# Patient Record
Sex: Male | Born: 1940
Health system: Southern US, Community
[De-identification: ages and names within clinical notes are randomized; demographics above are authoritative.]

## PROBLEM LIST (undated history)

## (undated) DIAGNOSIS — G4730 Sleep apnea, unspecified: Secondary | ICD-10-CM

## (undated) DIAGNOSIS — E669 Obesity, unspecified: Secondary | ICD-10-CM

## (undated) DIAGNOSIS — J45909 Unspecified asthma, uncomplicated: Secondary | ICD-10-CM

## (undated) DIAGNOSIS — K219 Gastro-esophageal reflux disease without esophagitis: Secondary | ICD-10-CM

## (undated) DIAGNOSIS — T7840XA Allergy, unspecified, initial encounter: Secondary | ICD-10-CM

## (undated) DIAGNOSIS — E119 Type 2 diabetes mellitus without complications: Secondary | ICD-10-CM

## (undated) DIAGNOSIS — I10 Essential (primary) hypertension: Secondary | ICD-10-CM

## (undated) HISTORY — PX: CHOLECYSTECTOMY: SHX55

## (undated) HISTORY — DX: Sleep apnea, unspecified: G47.30

## (undated) HISTORY — DX: Essential (primary) hypertension: I10

## (undated) HISTORY — PX: DENTAL SURGERY: SHX609

## (undated) HISTORY — DX: Obesity, unspecified: E66.9

## (undated) HISTORY — DX: Gastro-esophageal reflux disease without esophagitis: K21.9

## (undated) HISTORY — DX: Unspecified asthma, uncomplicated: J45.909

## (undated) HISTORY — DX: Type 2 diabetes mellitus without complications: E11.9

## (undated) HISTORY — DX: Allergy, unspecified, initial encounter: T78.40XA

---

## 2002-09-30 ENCOUNTER — Encounter: Admission: RE | Admit: 2002-09-30 | Discharge: 2002-09-30 | Payer: Self-pay | Admitting: Family Medicine

## 2002-09-30 ENCOUNTER — Encounter: Payer: Self-pay | Admitting: Family Medicine

## 2002-10-21 ENCOUNTER — Encounter: Payer: Self-pay | Admitting: General Surgery

## 2002-10-28 ENCOUNTER — Encounter: Payer: Self-pay | Admitting: General Surgery

## 2002-10-28 ENCOUNTER — Observation Stay (HOSPITAL_COMMUNITY): Admission: RE | Admit: 2002-10-28 | Discharge: 2002-10-30 | Payer: Self-pay | Admitting: General Surgery

## 2002-10-29 ENCOUNTER — Encounter: Payer: Self-pay | Admitting: Internal Medicine

## 2011-06-12 ENCOUNTER — Other Ambulatory Visit: Payer: Self-pay | Admitting: Family Medicine

## 2011-06-12 ENCOUNTER — Ambulatory Visit
Admission: RE | Admit: 2011-06-12 | Discharge: 2011-06-12 | Disposition: A | Discharge status: Home / Self Care | Payer: Medicare Other | Source: Ambulatory Visit | Attending: Family Medicine | Admitting: Family Medicine

## 2011-06-12 DIAGNOSIS — J189 Pneumonia, unspecified organism: Secondary | ICD-10-CM

## 2011-06-12 DIAGNOSIS — R062 Wheezing: Secondary | ICD-10-CM

## 2012-07-09 ENCOUNTER — Telehealth: Payer: Self-pay | Admitting: Family Medicine

## 2012-07-09 MED ORDER — CETIRIZINE HCL 10 MG PO TABS: 10.0000 mg | ORAL_TABLET | Freq: Every day | ORAL | Status: DC

## 2012-07-09 NOTE — Telephone Encounter (Signed)
rx refilled.

## 2012-07-13 ENCOUNTER — Telehealth: Payer: Self-pay | Admitting: Physician Assistant

## 2012-07-13 MED ORDER — ZAFIRLUKAST 10 MG PO TABS: 10.0000 mg | ORAL_TABLET | Freq: Two times a day (BID) | ORAL | Status: DC

## 2012-07-13 NOTE — Telephone Encounter (Signed)
Medication refilled per protocol.Patient needs to be seen before any further refills 

## 2012-08-10 ENCOUNTER — Telehealth: Payer: Self-pay | Admitting: Physician Assistant

## 2012-08-10 MED ORDER — ZAFIRLUKAST 10 MG PO TABS: 10.0000 mg | ORAL_TABLET | Freq: Two times a day (BID) | ORAL | Status: DC

## 2012-08-10 NOTE — Telephone Encounter (Signed)
Medication refilled per protocol. 

## 2013-04-05 ENCOUNTER — Ambulatory Visit (INDEPENDENT_AMBULATORY_CARE_PROVIDER_SITE_OTHER): Payer: Medicare Other | Admitting: Family Medicine

## 2013-04-05 ENCOUNTER — Encounter: Payer: Self-pay | Admitting: Family Medicine

## 2013-04-05 VITALS — BP 132/72 | HR 100 | Temp 97.4°F | Resp 24 | Wt 312.0 lb

## 2013-04-05 DIAGNOSIS — J45909 Unspecified asthma, uncomplicated: Secondary | ICD-10-CM

## 2013-04-05 DIAGNOSIS — G4730 Sleep apnea, unspecified: Secondary | ICD-10-CM | POA: Insufficient documentation

## 2013-04-05 MED ORDER — METHYLPREDNISOLONE ACETATE 40 MG/ML IJ SUSP
80.0000 mg | Freq: Once | INTRAMUSCULAR | Status: AC
  Administered 2013-04-05: 80 mg via INTRAMUSCULAR

## 2013-04-05 MED ORDER — PREDNISONE 20 MG PO TABS: ORAL_TABLET | ORAL | Status: DC

## 2013-04-05 NOTE — Progress Notes (Signed)
   Subjective:    Patient ID: William Howell, male    DOB: 1941/01/30, 72 y.o.   MRN: [id number]  HPI Patient began feeling sick one week ago. Symptoms included coughing, increasing shortness of breath, and wheezing. He went to an urgent care on Friday and was diagnosed with walking pneumonia. He was given a Z-Pak as well as albuterol 2 puffs inhaled every 6 hours. He is feeling some better. His pulse oximetry is 97% on room air. He is afebrile. He continues to have a significant amount of wheezing and coughing and shortness of breath. He is still requiring albuterol every 6 hours. Past Medical History  Diagnosis Date  . Obesity   . Hypertension   . Asthma   . Sleep apnea    Current Outpatient Prescriptions on File Prior to Visit  Medication Sig Dispense Refill  . cetirizine (ZYRTEC) 10 MG tablet Take 1 tablet (10 mg total) by mouth daily.  30 tablet  11  . zafirlukast (ACCOLATE) 10 MG tablet Take 1 tablet (10 mg total) by mouth 2 (two) times daily.  60 tablet  5   No current facility-administered medications on file prior to visit.   No Known Allergies History   Social History  . Marital Status: Married    Spouse Name: N/A    Number of Children: N/A  . Years of Education: N/A   Occupational History  . Not on file.   Social History Main Topics  . Smoking status: Never Smoker   . Smokeless tobacco: Not on file  . Alcohol Use: No  . Drug Use: No  . Sexual Activity: Not on file   Other Topics Concern  . Not on file   Social History Narrative  . No narrative on file      Review of Systems  All other systems reviewed and are negative.       Objective:   Physical Exam  Constitutional: He appears well-developed and well-nourished. No distress.  HENT:  Right Ear: External ear normal.  Left Ear: External ear normal.  Nose: Nose normal.  Mouth/Throat: Oropharynx is clear and moist. No oropharyngeal exudate.  Eyes: Conjunctivae are normal. Pupils are equal, round,  and reactive to light. No scleral icterus.  Neck: Neck supple. No JVD present.  Cardiovascular: Normal rate, regular rhythm and normal heart sounds.   No murmur heard. Pulmonary/Chest: Effort normal. He has wheezes. He has rales.  Abdominal: Soft. Bowel sounds are normal.  Lymphadenopathy:    He has no cervical adenopathy.  Skin: He is not diaphoretic.          Assessment & Plan:  1. Reactive airway disease Clinically the patient is improving. Continue the Z-Pak. However at the present time is wheezing significantly. I will add prednisone in. I gave the patient Depo-Medrol 80 mg IM x1 now. Tomorrow he is to start the prednisone taper pack. He can continue to use albuterol 2 puffs inhaled every 4-6 hours as needed. Recheck in 48 hours if no better or sooner if worse. - predniSONE (DELTASONE) 20 MG tablet; 3 tabs poqday 1-2, 2 tabs poqday 3-4, 1 tab poqday 5-6  Dispense: 12 tablet; Refill: 0 - methylPREDNISolone acetate (DEPO-MEDROL) injection 80 mg; Inject 2 mLs (80 mg total) into the muscle once.

## 2013-04-12 ENCOUNTER — Other Ambulatory Visit: Payer: Self-pay | Admitting: Family Medicine

## 2013-04-12 ENCOUNTER — Telehealth: Payer: Self-pay | Admitting: Family Medicine

## 2013-04-12 MED ORDER — BENZONATATE 100 MG PO CAPS: 200.0000 mg | ORAL_CAPSULE | Freq: Three times a day (TID) | ORAL | Status: DC | PRN

## 2013-04-12 NOTE — Telephone Encounter (Signed)
Patient aware.

## 2013-04-12 NOTE — Telephone Encounter (Signed)
Pt is calling to see if something can be called in for his cough [facility] is [facility] [address] Call back number is [phone]

## 2013-04-12 NOTE — Telephone Encounter (Signed)
I e-scribed tessalon to his pharmacy.

## 2013-05-17 ENCOUNTER — Other Ambulatory Visit: Payer: Self-pay | Admitting: Family Medicine

## 2013-05-17 NOTE — Telephone Encounter (Signed)
ok 

## 2013-05-17 NOTE — Telephone Encounter (Signed)
?   OK to Refill  

## 2013-07-29 ENCOUNTER — Other Ambulatory Visit: Payer: Self-pay | Admitting: Family Medicine

## 2013-09-04 ENCOUNTER — Emergency Department: Payer: Self-pay | Admitting: Emergency Medicine

## 2013-09-06 ENCOUNTER — Emergency Department: Payer: Self-pay | Admitting: Emergency Medicine

## 2013-09-08 ENCOUNTER — Other Ambulatory Visit: Payer: Self-pay | Admitting: Family Medicine

## 2013-09-08 NOTE — Telephone Encounter (Signed)
Refill appropriate and filled per protocol. 

## 2013-09-16 ENCOUNTER — Emergency Department: Payer: Self-pay | Admitting: Emergency Medicine

## 2013-11-13 ENCOUNTER — Encounter (HOSPITAL_COMMUNITY): Payer: Self-pay | Admitting: Emergency Medicine

## 2013-11-13 ENCOUNTER — Emergency Department (INDEPENDENT_AMBULATORY_CARE_PROVIDER_SITE_OTHER): Payer: Medicare Other

## 2013-11-13 ENCOUNTER — Emergency Department (HOSPITAL_COMMUNITY)
Admission: EM | Admit: 2013-11-13 | Discharge: 2013-11-13 | Disposition: A | Discharge status: Home / Self Care | Payer: Medicare Other | Source: Home / Self Care | Attending: Emergency Medicine | Admitting: Emergency Medicine

## 2013-11-13 DIAGNOSIS — J9801 Acute bronchospasm: Secondary | ICD-10-CM

## 2013-11-13 DIAGNOSIS — J069 Acute upper respiratory infection, unspecified: Secondary | ICD-10-CM

## 2013-11-13 MED ORDER — ALBUTEROL SULFATE (2.5 MG/3ML) 0.083% IN NEBU
INHALATION_SOLUTION | RESPIRATORY_TRACT | Status: AC
  Filled 2013-11-13: qty 6

## 2013-11-13 MED ORDER — PREDNISONE 20 MG PO TABS: ORAL_TABLET | ORAL | Status: DC

## 2013-11-13 MED ORDER — IPRATROPIUM BROMIDE 0.02 % IN SOLN
RESPIRATORY_TRACT | Status: AC
  Filled 2013-11-13: qty 2.5

## 2013-11-13 MED ORDER — ALBUTEROL SULFATE (2.5 MG/3ML) 0.083% IN NEBU
5.0000 mg | INHALATION_SOLUTION | Freq: Once | RESPIRATORY_TRACT | Status: AC
  Administered 2013-11-13: 5 mg via RESPIRATORY_TRACT

## 2013-11-13 MED ORDER — ALBUTEROL SULFATE HFA 108 (90 BASE) MCG/ACT IN AERS: 1.0000 | INHALATION_SPRAY | Freq: Four times a day (QID) | RESPIRATORY_TRACT | Status: DC | PRN

## 2013-11-13 MED ORDER — PREDNISONE 20 MG PO TABS
60.0000 mg | ORAL_TABLET | Freq: Once | ORAL | Status: AC
  Administered 2013-11-13: 60 mg via ORAL

## 2013-11-13 MED ORDER — IPRATROPIUM BROMIDE 0.02 % IN SOLN
0.5000 mg | Freq: Once | RESPIRATORY_TRACT | Status: AC
  Administered 2013-11-13: 0.5 mg via RESPIRATORY_TRACT

## 2013-11-13 MED ORDER — PREDNISONE 20 MG PO TABS
ORAL_TABLET | ORAL | Status: AC
  Filled 2013-11-13: qty 3

## 2013-11-13 MED ORDER — SODIUM CHLORIDE 0.9 % IN NEBU
INHALATION_SOLUTION | RESPIRATORY_TRACT | Status: AC
  Filled 2013-11-13: qty 3

## 2013-11-13 NOTE — ED Notes (Signed)
C/O dry cough, body aches x 3 days; [place] - did not have his albuterol nor his CPAP machine with him there.  Used inhaler when returned home yesterday - felt some improvement with ProAir.  Denies any fevers.

## 2013-11-13 NOTE — Discharge Instructions (Signed)
If symptoms become suddenly worse or severe or are unrelieved with use of your albuterol inhaler, please seek evaluation at your nearest emergency room.   Bronchospasm A bronchospasm is a spasm or tightening of the airways going into the lungs. During a bronchospasm breathing becomes more difficult because the airways get smaller. When this happens there can be coughing, a whistling sound when breathing (wheezing), and difficulty breathing. Bronchospasm is often associated with asthma, but not all patients who experience a bronchospasm have asthma. CAUSES  A bronchospasm is caused by inflammation or irritation of the airways. The inflammation or irritation may be triggered by:   Allergies (such as to animals, pollen, food, or mold). Allergens that cause bronchospasm may cause wheezing immediately after exposure or many hours later.   Infection. Viral infections are believed to be the most common cause of bronchospasm.   Exercise.   Irritants (such as pollution, cigarette smoke, strong odors, aerosol sprays, and paint fumes).   Weather changes. Winds increase molds and pollens in the air. Rain refreshes the air by washing irritants out. Cold air may cause inflammation.   Stress and emotional upset.  SIGNS AND SYMPTOMS   Wheezing.   Excessive nighttime coughing.   Frequent or severe coughing with a simple cold.   Chest tightness.   Shortness of breath.  DIAGNOSIS  Bronchospasm is usually diagnosed through a history and physical exam. Tests, such as chest X-rays, are sometimes done to look for other conditions. TREATMENT   Inhaled medicines can be given to open up your airways and help you breathe. The medicines can be given using either an inhaler or a nebulizer machine.  Corticosteroid medicines may be given for severe bronchospasm, usually when it is associated with asthma. HOME CARE INSTRUCTIONS   Always have a plan prepared for seeking medical care. Know when to  call your health care provider and local emergency services (911 in the [place] Know where you can access local emergency care.  Only take medicines as directed by your health care provider.  If you were prescribed an inhaler or nebulizer machine, ask your health care provider to explain how to use it correctly. Always use a spacer with your inhaler if you were given one.  It is necessary to remain calm during an attack. Try to relax and breathe more slowly.  Control your home environment in the following ways:   Change your heating and air conditioning filter at least once a month.   Limit your use of fireplaces and wood stoves.  Do not smoke and do not allow smoking in your home.   Avoid exposure to perfumes and fragrances.   Get rid of pests (such as roaches and mice) and their droppings.   Throw away plants if you see mold on them.   Keep your house clean and dust free.   Replace carpet with wood, tile, or vinyl flooring. Carpet can trap dander and dust.   Use allergy-proof pillows, mattress covers, and box spring covers.   Wash bed sheets and blankets every week in hot water and dry them in a dryer.   Use blankets that are made of polyester or cotton.   Wash hands frequently. SEEK MEDICAL CARE IF:   You have muscle aches.   You have chest pain.   The sputum changes from clear or white to yellow, green, gray, or bloody.   The sputum you cough up gets thicker.   There are problems that may be related to the medicine you  are given, such as a rash, itching, swelling, or trouble breathing.  SEEK IMMEDIATE MEDICAL CARE IF:   You have worsening wheezing and coughing even after taking your prescribed medicines.   You have increased difficulty breathing.   You develop severe chest pain. MAKE SURE YOU:   Understand these instructions.  Will watch your condition.  Will get help right away if you are not doing well or get worse. Document Released:  04/04/2003 Document Revised: 04/06/2013 Document Reviewed: 09/21/2012 [facility] Patient Information 2015 [place], [place]. This information is not intended to replace advice given to you by your health care provider. Make sure you discuss any questions you have with your health care provider.  Upper Respiratory Infection, Adult An upper respiratory infection (URI) is also sometimes known as the common cold. The upper respiratory tract includes the nose, sinuses, throat, trachea, and bronchi. Bronchi are the airways leading to the lungs. Most people improve within 1 week, but symptoms can last up to 2 weeks. A residual cough may last even longer.  CAUSES Many different viruses can infect the tissues lining the upper respiratory tract. The tissues become irritated and inflamed and often become very moist. Mucus production is also common. A cold is contagious. You can easily spread the virus to others by oral contact. This includes kissing, sharing a glass, coughing, or sneezing. Touching your mouth or nose and then touching a surface, which is then touched by another person, can also spread the virus. SYMPTOMS  Symptoms typically develop 1 to 3 days after you come in contact with a cold virus. Symptoms vary from person to person. They may include:  Runny nose.  Sneezing.  Nasal congestion.  Sinus irritation.  Sore throat.  Loss of voice (laryngitis).  Cough.  Fatigue.  Muscle aches.  Loss of appetite.  Headache.  Low-grade fever. DIAGNOSIS  You might diagnose your own cold based on familiar symptoms, since most people get a cold 2 to 3 times a year. Your caregiver can confirm this based on your exam. Most importantly, your caregiver can check that your symptoms are not due to another disease such as strep throat, sinusitis, pneumonia, asthma, or epiglottitis. Blood tests, throat tests, and X-rays are not necessary to diagnose a common cold, but they may sometimes be helpful in excluding  other more serious diseases. Your caregiver will decide if any further tests are required. RISKS AND COMPLICATIONS  You may be at risk for a more severe case of the common cold if you smoke cigarettes, have chronic heart disease (such as heart failure) or lung disease (such as asthma), or if you have a weakened immune system. The very young and very old are also at risk for more serious infections. Bacterial sinusitis, middle ear infections, and bacterial pneumonia can complicate the common cold. The common cold can worsen asthma and chronic obstructive pulmonary disease (COPD). Sometimes, these complications can require emergency medical care and may be life-threatening. PREVENTION  The best way to protect against getting a cold is to practice good hygiene. Avoid oral or hand contact with people with cold symptoms. Wash your hands often if contact occurs. There is no clear evidence that vitamin C, vitamin E, echinacea, or exercise reduces the chance of developing a cold. However, it is always recommended to get plenty of rest and practice good nutrition. TREATMENT  Treatment is directed at relieving symptoms. There is no cure. Antibiotics are not effective, because the infection is caused by a virus, not by bacteria. Treatment may  include:  Increased fluid intake. Sports drinks offer valuable electrolytes, sugars, and fluids.  Breathing heated mist or steam (vaporizer or shower).  Eating chicken soup or other clear broths, and maintaining good nutrition.  Getting plenty of rest.  Using gargles or lozenges for comfort.  Controlling fevers with ibuprofen or acetaminophen as directed by your caregiver.  Increasing usage of your inhaler if you have asthma. Zinc gel and zinc lozenges, taken in the first 24 hours of the common cold, can shorten the duration and lessen the severity of symptoms. Pain medicines may help with fever, muscle aches, and throat pain. A variety of non-prescription medicines  are available to treat congestion and runny nose. Your caregiver can make recommendations and may suggest nasal or lung inhalers for other symptoms.  HOME CARE INSTRUCTIONS   Only take over-the-counter or prescription medicines for pain, discomfort, or fever as directed by your caregiver.  Use a warm mist humidifier or inhale steam from a shower to increase air moisture. This may keep secretions moist and make it easier to breathe.  Drink enough water and fluids to keep your urine clear or pale yellow.  Rest as needed.  Return to work when your temperature has returned to normal or as your caregiver advises. You may need to stay home longer to avoid infecting others. You can also use a face mask and careful hand washing to prevent spread of the virus. SEEK MEDICAL CARE IF:   After the first few days, you feel you are getting worse rather than better.  You need your caregiver's advice about medicines to control symptoms.  You develop chills, worsening shortness of breath, or brown or red sputum. These may be signs of pneumonia.  You develop yellow or brown nasal discharge or pain in the face, especially when you bend forward. These may be signs of sinusitis.  You develop a fever, swollen neck glands, pain with swallowing, or white areas in the back of your throat. These may be signs of strep throat. SEEK IMMEDIATE MEDICAL CARE IF:   You have a fever.  You develop severe or persistent headache, ear pain, sinus pain, or chest pain.  You develop wheezing, a prolonged cough, cough up blood, or have a change in your usual mucus (if you have chronic lung disease).  You develop sore muscles or a stiff neck. Document Released: 09/25/2000 Document Revised: 06/24/2011 Document Reviewed: 07/07/2013 [facility] Patient Information 2015 [place], [place]. This information is not intended to replace advice given to you by your health care provider. Make sure you discuss any questions you have with your  health care provider.

## 2013-11-13 NOTE — ED Provider Notes (Signed)
CSN: [id number]     Arrival date & time 11/13/13  1758 History   None    Chief Complaint  Patient presents with  . Cough  . Generalized Body Aches   (Consider location/radiation/quality/duration/timing/severity/associated sxs/prior Treatment) HPI Comments: Uses smokeless tobacco PCP: [facility]  Patient is a 73 y.o. male presenting with cough. The history is provided by the patient.  Cough Cough characteristics:  Dry Severity:  Moderate Onset quality:  Gradual Duration:  4 days Progression:  Worsening Chronicity:  New Smoker: no   Relieved by:  Beta-agonist inhaler Associated symptoms: myalgias and wheezing   Associated symptoms: no chest pain, no chills, no diaphoresis, no ear fullness, no ear pain, no eye discharge, no fever, no headaches, no rash, no rhinorrhea, no shortness of breath, no sinus congestion, no sore throat and no weight loss     Past Medical History  Diagnosis Date  . Obesity   . Hypertension   . Asthma   . Sleep apnea     uses CPAP   Past Surgical History  Procedure Laterality Date  . Cholecystectomy     No family history on file. History  Substance Use Topics  . Smoking status: Never Smoker   . Smokeless tobacco: Not on file  . Alcohol Use: No    Review of Systems  Constitutional: Negative for fever, chills, weight loss and diaphoresis.  HENT: Negative for ear pain, rhinorrhea and sore throat.   Eyes: Negative for discharge.  Respiratory: Positive for cough and wheezing. Negative for shortness of breath.   Cardiovascular: Negative for chest pain.  Musculoskeletal: Positive for myalgias.  Skin: Negative for rash.  Neurological: Negative for headaches.  All other systems reviewed and are negative.   Allergies  Review of patient's allergies indicates no known allergies.  Home Medications   Prior to Admission medications   Medication Sig Start Date End Date Taking? Authorizing Provider  albuterol (PROAIR HFA) 108 (90 BASE) MCG/ACT inhaler  Inhale 2 puffs into the lungs every 6 (six) hours as needed for wheezing or shortness of breath.   Yes Historical Provider, MD  cetirizine (ZYRTEC) 10 MG tablet TAKE 1 TABLET EVERY DAY 07/29/13  Yes Donita Brooks, MD  zafirlukast (ACCOLATE) 20 MG tablet TAKE 1 TABLET TWICE A DAY 09/08/13  Yes Donita Brooks, MD  albuterol (PROVENTIL HFA;VENTOLIN HFA) 108 (90 BASE) MCG/ACT inhaler Inhale 1-2 puffs into the lungs every 6 (six) hours as needed for wheezing or shortness of breath. 11/13/13   Ardis Rowan, PA  benzonatate (TESSALON) 100 MG capsule TAKE 2 CAPSULES BY MOUTH 3 TIMES DAILY AS NEEDED FOR COUGH. 05/17/13   Donita Brooks, MD  predniSONE (DELTASONE) 20 MG tablet 3 tabs poqday 1-2, 2 tabs poqday 3-4, 1 tab poqday 5-6 04/05/13   Donita Brooks, MD  predniSONE (DELTASONE) 20 MG tablet Beginning 11/14/2013 take tow tabs po QD x 2 days 11/13/13   Ardis Rowan, PA  zafirlukast (ACCOLATE) 10 MG tablet Take 1 tablet (10 mg total) by mouth 2 (two) times daily. 08/10/12   Mary B Dixon, PA-C   BP 156/86  Pulse 87  Temp(Src) 98.3 F (36.8 C) (Oral)  Resp 20  SpO2 95% Physical Exam  Nursing note and vitals reviewed. Constitutional: He is oriented to person, place, and time. He appears well-developed and well-nourished.  +obese  HENT:  Head: Normocephalic and atraumatic.  Eyes: Conjunctivae are normal.  Cardiovascular: Normal rate, regular rhythm and normal heart sounds.   Pulmonary/Chest: Effort  normal. No respiratory distress. He has wheezes. He has no rales. He exhibits no tenderness.  +moderate end-expiratory wheeze  Musculoskeletal: Normal range of motion.  Neurological: He is alert and oriented to person, place, and time.  Skin: Skin is warm and dry.  Psychiatric: He has a normal mood and affect. His behavior is normal.    ED Course  Procedures (including critical care time) Labs Review Labs Reviewed - No data to display  Imaging Review Dg Chest 2 View  11/13/2013    CLINICAL DATA:  Three day history of cough, shortness of breath and wheezing. Current history of asthma and hypertension.  EXAM: CHEST  2 VIEW  COMPARISON:  09/05/2013, 06/12/2011.  FINDINGS: Suboptimal inspiration. Cardiac silhouette normal in size, unchanged. Thoracic aorta mildly tortuous and atherosclerotic, unchanged. Hilar and mediastinal contours otherwise unremarkable. Mildly prominent bronchovascular markings diffusely and mild central peribronchial thickening, similar to the prior examinations. Lungs otherwise clear. No localized airspace consolidation. No pleural effusions. No pneumothorax. Normal pulmonary vascularity. Degenerative changes involving the thoracic spine.  IMPRESSION: Suboptimal inspiration. Stable mild changes of chronic bronchitis and/or asthma. No acute cardiopulmonary disease.   Electronically Signed   By: Hulan Saashomas  Lawrence M.D.   On: 11/13/2013 19:01     MDM   1. URI (upper respiratory infection)   2. Bronchospasm    CXR unremarkable for CAP. Likely acute exacerbation of chronic bronchitis with associated bronchospasm. No hypoxia or fever. Patient given albuterol 5 mg and atrovent 0.5 mg [facility]  of oral prednisone. Patient re-evaluated after nebulizer treatment and reports significant improvement. Upon re-exam, wheezing resolved. Will discharge home with new Rx for albuterol MDI and 2 additional days of prednisone and advise close follow up with his PCP. He understands that if symptoms become suddenly worse or severe, he is to report to his nearest ER for assistance.   Jess BartersJennifer Lee CoalingaPresson, [place] 11/13/13 [phone]

## 2013-11-13 NOTE — ED Notes (Signed)
States cough and breathing feel so much better.

## 2013-11-15 NOTE — ED Provider Notes (Signed)
Medical screening examination/treatment/procedure(s) were performed by non-physician practitioner and as supervising physician I was immediately available for consultation/collaboration.  Ehren Berisha, M.D.  Takima Encina C Rayne Cowdrey, MD 11/15/13 0753 

## 2013-12-10 ENCOUNTER — Other Ambulatory Visit: Payer: Self-pay | Admitting: Family Medicine

## 2014-02-09 ENCOUNTER — Telehealth: Payer: Self-pay | Admitting: *Deleted

## 2014-02-09 DIAGNOSIS — Z299 Encounter for prophylactic measures, unspecified: Secondary | ICD-10-CM

## 2014-02-09 NOTE — Telephone Encounter (Signed)
Received call from patient.   Requested to have prescription for Zostavax sent to [facility]- [facility] [address].   Ok to send?

## 2014-02-10 MED ORDER — ZOSTER VACCINE LIVE 19400 UNT/0.65ML ~~LOC~~ SOLR: 0.6500 mL | Freq: Once | SUBCUTANEOUS | Status: DC

## 2014-02-10 NOTE — Telephone Encounter (Signed)
Prescription sent to pharmacy. .   Call placed to patient and patient made aware.  

## 2014-02-10 NOTE — Telephone Encounter (Signed)
ok 

## 2014-04-15 ENCOUNTER — Other Ambulatory Visit: Payer: Self-pay | Admitting: Family Medicine

## 2014-05-30 ENCOUNTER — Other Ambulatory Visit: Payer: Self-pay | Admitting: Family Medicine

## 2014-06-23 ENCOUNTER — Ambulatory Visit (INDEPENDENT_AMBULATORY_CARE_PROVIDER_SITE_OTHER): Payer: Medicare Other | Admitting: Family Medicine

## 2014-06-23 ENCOUNTER — Encounter: Payer: Self-pay | Admitting: Family Medicine

## 2014-06-23 VITALS — BP 146/90 | HR 98 | Temp 98.3°F | Resp 22 | Wt 315.0 lb

## 2014-06-23 DIAGNOSIS — J45909 Unspecified asthma, uncomplicated: Secondary | ICD-10-CM

## 2014-06-23 DIAGNOSIS — J209 Acute bronchitis, unspecified: Secondary | ICD-10-CM

## 2014-06-23 MED ORDER — AZITHROMYCIN 250 MG PO TABS: ORAL_TABLET | ORAL | Status: DC

## 2014-06-23 MED ORDER — PREDNISONE 20 MG PO TABS: ORAL_TABLET | ORAL | Status: DC

## 2014-06-23 NOTE — Progress Notes (Signed)
   Subjective:    Patient ID: William Howell, male    DOB: 16-Jul-1940, 74 y.o.   MRN: [id number]  HPI  patient is a very pleasant 74 year old white male with a history of obesity, obstructive sleep apnea, and asthma. I have not seen the patient in quite some time. He presents with over one week of severe congestion, rhinorrhea , and cough. The cough is productive of green sputum. On examination today he is wheezing partially in all  Lung fields with markedly diminished breath sounds and some faint Rales bilaterally. He also has significant rhinorrhea with mucosal edema and bilateral maxillary sinus tenderness. He reports subjective fever and cough productive of green sputum Past Medical History  Diagnosis Date  . Obesity   . Hypertension   . Asthma   . Sleep apnea     uses CPAP   Past Surgical History  Procedure Laterality Date  . Cholecystectomy     Current Outpatient Prescriptions on File Prior to Visit  Medication Sig Dispense Refill  . albuterol (PROVENTIL HFA;VENTOLIN HFA) 108 (90 BASE) MCG/ACT inhaler Inhale 1-2 puffs into the lungs every 6 (six) hours as needed for wheezing or shortness of breath. 1 Inhaler 0  . zafirlukast (ACCOLATE) 10 MG tablet Take 1 tablet (10 mg total) by mouth 2 (two) times daily. 60 tablet 5   No current facility-administered medications on file prior to visit.   No Known Allergies History   Social History  . Marital Status: Married    Spouse Name: N/A  . Number of Children: N/A  . Years of Education: N/A   Occupational History  . Not on file.   Social History Main Topics  . Smoking status: Never Smoker   . Smokeless tobacco: Not on file  . Alcohol Use: No  . Drug Use: No  . Sexual Activity: Not on file   Other Topics Concern  . Not on file   Social History Narrative      Review of Systems  All other systems reviewed and are negative.      Objective:   Physical Exam  Constitutional: He appears well-developed and  well-nourished.  HENT:  Right Ear: Tympanic membrane, external ear and ear canal normal.  Left Ear: Tympanic membrane, external ear and ear canal normal.  Nose: Mucosal edema and rhinorrhea present. Right sinus exhibits maxillary sinus tenderness. Left sinus exhibits maxillary sinus tenderness.  Mouth/Throat: Oropharynx is clear and moist.  Neck: Neck supple.  Cardiovascular: Normal rate, regular rhythm and normal heart sounds.   Pulmonary/Chest: He is in respiratory distress. He has wheezes. He has rales.  Lymphadenopathy:    He has no cervical adenopathy.  Vitals reviewed.         Assessment & Plan:  Acute bronchitis with asthma - Plan: predniSONE (DELTASONE) 20 MG tablet, azithromycin (ZITHROMAX) 250 MG tablet   Begin prednisone taper pack immediately. Used albuterol 2 puffs inhaled every 6 hours as needed for wheezing. I will cover bronchitis with a Z-Pak 500 mg by mouth daily one, 250 mg by mouth daily as 2 through 5. Recheck next week if not better or immediately if worse.

## 2014-06-30 ENCOUNTER — Other Ambulatory Visit: Payer: Self-pay | Admitting: Family Medicine

## 2014-06-30 ENCOUNTER — Other Ambulatory Visit: Payer: Medicare Other

## 2014-06-30 DIAGNOSIS — Z0000 Encounter for general adult medical examination without abnormal findings: Secondary | ICD-10-CM

## 2014-06-30 LAB — COMPLETE METABOLIC PANEL WITH GFR
ALT: 34 U/L (ref 0–53)
AST: 20 U/L (ref 0–37)
Albumin: 3.8 g/dL (ref 3.5–5.2)
Alkaline Phosphatase: 69 U/L (ref 39–117)
BUN: 19 mg/dL (ref 6–23)
CO2: 24 mEq/L (ref 19–32)
Calcium: 9.2 mg/dL (ref 8.4–10.5)
Chloride: 102 mEq/L (ref 96–112)
Creat: 1.17 mg/dL (ref 0.50–1.35)
GFR, Est African American: 71 mL/min
GFR, Est Non African American: 61 mL/min
Glucose, Bld: 134 mg/dL — ABNORMAL HIGH (ref 70–99)
Potassium: 4.2 mEq/L (ref 3.5–5.3)
Sodium: 140 mEq/L (ref 135–145)
Total Bilirubin: 0.4 mg/dL (ref 0.2–1.2)
Total Protein: 6 g/dL (ref 6.0–8.3)

## 2014-06-30 LAB — LIPID PANEL
Cholesterol: 140 mg/dL (ref 0–200)
HDL: 42 mg/dL (ref >=40)
LDL Cholesterol: 67 mg/dL (ref 0–99)
Total CHOL/HDL Ratio: 3.3 Ratio
Triglycerides: 155 mg/dL — ABNORMAL HIGH (ref <150)
VLDL: 31 mg/dL (ref 0–40)

## 2014-06-30 LAB — CBC WITH DIFFERENTIAL/PLATELET
Basophils Absolute: 0 10*3/uL (ref 0.0–0.1)
Basophils Relative: 0 % (ref 0–1)
Eosinophils Absolute: 0.7 10*3/uL (ref 0.0–0.7)
Eosinophils Relative: 5 % (ref 0–5)
HCT: 46.9 % (ref 39.0–52.0)
Hemoglobin: 15.6 g/dL (ref 13.0–17.0)
Lymphocytes Relative: 26 % (ref 12–46)
Lymphs Abs: 3.5 10*3/uL (ref 0.7–4.0)
MCH: 30.1 pg (ref 26.0–34.0)
MCHC: 33.3 g/dL (ref 30.0–36.0)
MCV: 90.4 fL (ref 78.0–100.0)
MPV: 9.2 fL (ref 8.6–12.4)
Monocytes Absolute: 1.2 10*3/uL — ABNORMAL HIGH (ref 0.1–1.0)
Monocytes Relative: 9 % (ref 3–12)
Neutro Abs: 8.2 10*3/uL — ABNORMAL HIGH (ref 1.7–7.7)
Neutrophils Relative %: 60 % (ref 43–77)
Platelets: 282 10*3/uL (ref 150–400)
RBC: 5.19 MIL/uL (ref 4.22–5.81)
RDW: 13.7 % (ref 11.5–15.5)
WBC: 13.6 10*3/uL — ABNORMAL HIGH (ref 4.0–10.5)

## 2014-07-04 ENCOUNTER — Encounter: Payer: Self-pay | Admitting: Family Medicine

## 2014-07-04 ENCOUNTER — Ambulatory Visit (INDEPENDENT_AMBULATORY_CARE_PROVIDER_SITE_OTHER): Payer: Medicare Other | Admitting: Family Medicine

## 2014-07-04 VITALS — BP 142/84 | HR 88 | Temp 98.2°F | Resp 18 | Ht 69.0 in | Wt 318.0 lb

## 2014-07-04 DIAGNOSIS — Z0000 Encounter for general adult medical examination without abnormal findings: Secondary | ICD-10-CM

## 2014-07-04 DIAGNOSIS — Z23 Encounter for immunization: Secondary | ICD-10-CM

## 2014-07-04 LAB — HEMOGLOBIN A1C
Hgb A1c MFr Bld: 6.6 % — ABNORMAL HIGH (ref <5.7)
Mean Plasma Glucose: 143 mg/dL — ABNORMAL HIGH (ref <117)

## 2014-07-04 NOTE — Progress Notes (Signed)
Subjective:    Patient ID: William Howell, male    DOB: 03-May-1940, 74 y.o.   MRN: [id number]  HPI Is here today for complete physical exam. He denies any concerns. He is overdue for colonoscopy as he has never had one. He is also due for a prostate exam as well as a PSA. Patient's Pneumovax 23, shingles vaccine, tetanus vaccine are all up-to-date. He is due for Prevnar 13. His blood pressure is elevated today at 142/84. His most recent lab work as listed below and is significant for type 2 diabetes: Lab on 06/30/2014  Component Date Value Ref Range Status  . WBC 06/30/2014 13.6* 4.0 - 10.5 K/uL Final  . RBC 06/30/2014 5.19  4.22 - 5.81 MIL/uL Final  . Hemoglobin 06/30/2014 15.6  13.0 - 17.0 g/dL Final  . HCT 06/30/2014 46.9  39.0 - 52.0 % Final  . MCV 06/30/2014 90.4  78.0 - 100.0 fL Final  . MCH 06/30/2014 30.1  26.0 - 34.0 pg Final  . MCHC 06/30/2014 33.3  30.0 - 36.0 g/dL Final  . RDW 06/30/2014 13.7  11.5 - 15.5 % Final  . Platelets 06/30/2014 282  150 - 400 K/uL Final  . MPV 06/30/2014 9.2  8.6 - 12.4 fL Final  . Neutrophils Relative % 06/30/2014 60  43 - 77 % Final  . Neutro Abs 06/30/2014 8.2* 1.7 - 7.7 K/uL Final  . Lymphocytes Relative 06/30/2014 26  12 - 46 % Final  . Lymphs Abs 06/30/2014 3.5  0.7 - 4.0 K/uL Final  . Monocytes Relative 06/30/2014 9  3 - 12 % Final  . Monocytes Absolute 06/30/2014 1.2* 0.1 - 1.0 K/uL Final  . Eosinophils Relative 06/30/2014 5  0 - 5 % Final  . Eosinophils Absolute 06/30/2014 0.7  0.0 - 0.7 K/uL Final  . Basophils Relative 06/30/2014 0  0 - 1 % Final  . Basophils Absolute 06/30/2014 0.0  0.0 - 0.1 K/uL Final  . Smear Review 06/30/2014 Criteria for review not met   Final  . Cholesterol 06/30/2014 140  0 - 200 mg/dL Final   Comment: ATP III Classification:       < 200        mg/dL        Desirable      200 - 239     mg/dL        Borderline High      >= 240        mg/dL        High     . Triglycerides 06/30/2014 155* <150 mg/dL Final    . HDL 06/30/2014 42  >=40 mg/dL Final   ** Please note change in reference range(s). **  . Total CHOL/HDL Ratio 06/30/2014 3.3   Final  . VLDL 06/30/2014 31  0 - 40 mg/dL Final  . LDL Cholesterol 06/30/2014 67  0 - 99 mg/dL Final   Comment:   Total Cholesterol/HDL Ratio:CHD Risk                        Coronary Heart Disease Risk Table                                        Men       Women          1/2 Average Risk  3.4        3.3              Average Risk              5.0        4.4           2X Average Risk              9.6        7.1           3X Average Risk             23.4       11.0 Use the calculated Patient Ratio above and the CHD Risk table  to determine the patient's CHD Risk. ATP III Classification (LDL):       < 100        mg/dL         Optimal      100 - 129     mg/dL         Near or Above Optimal      130 - 159     mg/dL         Borderline High      160 - 189     mg/dL         High       > 190        mg/dL         Very High     . Sodium 06/30/2014 140  135 - 145 mEq/L Final  . Potassium 06/30/2014 4.2  3.5 - 5.3 mEq/L Final  . Chloride 06/30/2014 102  96 - 112 mEq/L Final  . CO2 06/30/2014 24  19 - 32 mEq/L Final  . Glucose, Bld 06/30/2014 134* 70 - 99 mg/dL Final  . BUN 06/30/2014 19  6 - 23 mg/dL Final  . Creat 06/30/2014 1.17  0.50 - 1.35 mg/dL Final  . Total Bilirubin 06/30/2014 0.4  0.2 - 1.2 mg/dL Final  . Alkaline Phosphatase 06/30/2014 69  39 - 117 U/L Final  . AST 06/30/2014 20  0 - 37 U/L Final  . ALT 06/30/2014 34  0 - 53 U/L Final  . Total Protein 06/30/2014 6.0  6.0 - 8.3 g/dL Final  . Albumin 06/30/2014 3.8  3.5 - 5.2 g/dL Final  . Calcium 06/30/2014 9.2  8.4 - 10.5 mg/dL Final  . GFR, Est African American 06/30/2014 71   Final  . GFR, Est Non African American 06/30/2014 61   Final   Comment:   The estimated GFR is a calculation valid for adults (>=2 years old) that uses the CKD-EPI algorithm to adjust for age and sex. It is   not  to be used for children, pregnant women, hospitalized patients,    patients on dialysis, or with rapidly changing kidney function. According to the NKDEP, eGFR >89 is normal, 60-89 shows mild impairment, 30-59 shows moderate impairment, 15-29 shows severe impairment and <15 is ESRD.     Orders Only on 06/30/2014  Component Date Value Ref Range Status  . Hgb A1c MFr Bld 06/30/2014 6.6* <5.7 % Final   Comment:  According to the ADA Clinical Practice Recommendations for 2011, when HbA1c is used as a screening test:     >=6.5%   Diagnostic of Diabetes Mellitus            (if abnormal result is confirmed)   5.7-6.4%   Increased risk of developing Diabetes Mellitus   References:Diagnosis and Classification of Diabetes Mellitus,Diabetes IRWE,3154,00(QQPYP 1):S62-S69 and Standards of Medical Care in         Diabetes - 2011,Diabetes PJKD,3267,12 (Suppl 1):S11-S61.     . Mean Plasma Glucose 06/30/2014 143* <117 mg/dL Final   Past Medical History  Diagnosis Date  . Obesity   . Hypertension   . Asthma   . Sleep apnea     uses CPAP   Past Surgical History  Procedure Laterality Date  . Cholecystectomy     Current Outpatient Prescriptions on File Prior to Visit  Medication Sig Dispense Refill  . albuterol (PROVENTIL HFA;VENTOLIN HFA) 108 (90 BASE) MCG/ACT inhaler Inhale 1-2 puffs into the lungs every 6 (six) hours as needed for wheezing or shortness of breath. 1 Inhaler 0  . loratadine (CLARITIN) 10 MG tablet Take 10 mg by mouth daily.    . fluticasone (FLONASE) 50 MCG/ACT nasal spray Place 2 sprays into both nostrils daily.    . zafirlukast (ACCOLATE) 10 MG tablet Take 1 tablet (10 mg total) by mouth 2 (two) times daily. (Patient not taking: Reported on 07/04/2014) 60 tablet 5   No current facility-administered medications on file prior to visit.   No Known Allergies History   Social History  . Marital Status:  Married    Spouse Name: N/A  . Number of Children: N/A  . Years of Education: N/A   Occupational History  . Not on file.   Social History Main Topics  . Smoking status: Never Smoker   . Smokeless tobacco: Not on file  . Alcohol Use: No  . Drug Use: No  . Sexual Activity: Not on file   Other Topics Concern  . Not on file   Social History Narrative   No family history on file.    Review of Systems  All other systems reviewed and are negative.      Objective:   Physical Exam  Constitutional: He is oriented to person, place, and time. He appears well-developed and well-nourished. No distress.  HENT:  Head: Normocephalic and atraumatic.  Right Ear: External ear normal.  Left Ear: External ear normal.  Nose: Nose normal.  Mouth/Throat: Oropharynx is clear and moist. No oropharyngeal exudate.  Eyes: Conjunctivae and EOM are normal. Pupils are equal, round, and reactive to light. Right eye exhibits no discharge. Left eye exhibits no discharge. No scleral icterus.  Neck: Normal range of motion. Neck supple. No JVD present. No tracheal deviation present. No thyromegaly present.  Cardiovascular: Normal rate, regular rhythm, normal heart sounds and intact distal pulses.  Exam reveals no gallop and no friction rub.   No murmur heard. Pulmonary/Chest: Effort normal and breath sounds normal. No stridor. No respiratory distress. He has no wheezes. He has no rales. He exhibits no tenderness.  Abdominal: Soft. Bowel sounds are normal. He exhibits no distension and no mass. There is no tenderness. There is no rebound and no guarding.  Genitourinary: Rectum normal, prostate normal and penis normal.  Musculoskeletal: Normal range of motion. He exhibits no edema or tenderness.  Lymphadenopathy:    He has no cervical adenopathy.  Neurological: He is alert and oriented to person, place, and  time. He has normal reflexes. He displays normal reflexes. No cranial nerve deficit. He exhibits normal  muscle tone. Coordination normal.  Skin: Skin is warm. No rash noted. He is not diaphoretic. No erythema. No pallor.  Psychiatric: He has a normal mood and affect. His behavior is normal. Judgment and thought content normal.  Vitals reviewed.         Assessment & Plan:  Need for prophylactic vaccination against Streptococcus pneumoniae (pneumococcus) - Plan: Pneumococcal conjugate vaccine 13-valent IM  Routine general medical examination at a health care facility  Patient's physical exam is significant for hypertension, diabetes mellitus type 2, and morbid obesity. I recommended aggressive weight loss. I recommended a low carbohydrate diet. I have recommended a low salt diet. I recommended regular aerobic exercise. I would like to recheck his fasting lab work and his blood pressure in 3 months and if still elevated at that time I will start the patient on losartan and consider starting the patient on an oral hypoglycemic agent. I will schedule the patient for colonoscopy. Due to his body habitus I cannot fully evaluate his prostate therefore I will add a PSA to his lab work. The patient also received Prevnar 13 today in office

## 2014-07-05 LAB — PSA: PSA: 1.84 ng/mL (ref <=4.00)

## 2014-07-12 ENCOUNTER — Encounter: Payer: Self-pay | Admitting: Internal Medicine

## 2014-07-13 ENCOUNTER — Encounter: Payer: Self-pay | Admitting: Internal Medicine

## 2014-07-19 LAB — HM DIABETES EYE EXAM

## 2014-07-27 ENCOUNTER — Encounter: Payer: Self-pay | Admitting: Family Medicine

## 2014-09-16 ENCOUNTER — Other Ambulatory Visit: Payer: Self-pay | Admitting: Family Medicine

## 2014-09-19 ENCOUNTER — Other Ambulatory Visit: Payer: Self-pay | Admitting: Family Medicine

## 2014-09-19 MED ORDER — CETIRIZINE HCL 10 MG PO TABS: 10.0000 mg | ORAL_TABLET | Freq: Every day | ORAL | Status: DC

## 2014-09-19 NOTE — Telephone Encounter (Signed)
Pt. Not taking /lm

## 2014-09-19 NOTE — Telephone Encounter (Signed)
Medication called/sent to requested pharmacy  

## 2014-12-21 ENCOUNTER — Other Ambulatory Visit: Payer: Self-pay | Admitting: Family Medicine

## 2014-12-22 NOTE — Telephone Encounter (Signed)
Refill appropriate and filled per protocol. 

## 2015-01-21 ENCOUNTER — Other Ambulatory Visit: Payer: Self-pay | Admitting: Family Medicine

## 2015-01-23 NOTE — Telephone Encounter (Signed)
Refill appropriate and filled per protocol. 

## 2015-03-14 ENCOUNTER — Ambulatory Visit (INDEPENDENT_AMBULATORY_CARE_PROVIDER_SITE_OTHER): Payer: Medicare Other | Admitting: Family Medicine

## 2015-03-14 DIAGNOSIS — Z23 Encounter for immunization: Secondary | ICD-10-CM

## 2015-05-15 ENCOUNTER — Other Ambulatory Visit: Payer: Self-pay | Admitting: Family Medicine

## 2015-05-15 NOTE — Telephone Encounter (Signed)
Medication refilled per protocol. 

## 2015-09-27 DIAGNOSIS — G4731 Primary central sleep apnea: Secondary | ICD-10-CM | POA: Diagnosis not present

## 2015-09-27 DIAGNOSIS — G4730 Sleep apnea, unspecified: Secondary | ICD-10-CM | POA: Diagnosis not present

## 2015-11-15 ENCOUNTER — Institutional Professional Consult (permissible substitution): Payer: Medicare Other | Admitting: Pulmonary Disease

## 2015-12-11 ENCOUNTER — Other Ambulatory Visit: Payer: Self-pay | Admitting: Family Medicine

## 2015-12-12 ENCOUNTER — Ambulatory Visit (INDEPENDENT_AMBULATORY_CARE_PROVIDER_SITE_OTHER)
Admission: RE | Admit: 2015-12-12 | Discharge: 2015-12-12 | Disposition: A | Discharge status: Home / Self Care | Payer: Medicare Other | Source: Ambulatory Visit | Attending: Pulmonary Disease | Admitting: Pulmonary Disease

## 2015-12-12 ENCOUNTER — Encounter: Payer: Self-pay | Admitting: Pulmonary Disease

## 2015-12-12 ENCOUNTER — Other Ambulatory Visit (INDEPENDENT_AMBULATORY_CARE_PROVIDER_SITE_OTHER): Payer: Medicare Other

## 2015-12-12 ENCOUNTER — Ambulatory Visit (INDEPENDENT_AMBULATORY_CARE_PROVIDER_SITE_OTHER): Payer: Medicare Other | Admitting: Pulmonary Disease

## 2015-12-12 DIAGNOSIS — R0600 Dyspnea, unspecified: Secondary | ICD-10-CM

## 2015-12-12 DIAGNOSIS — R0602 Shortness of breath: Secondary | ICD-10-CM | POA: Diagnosis not present

## 2015-12-12 DIAGNOSIS — G4730 Sleep apnea, unspecified: Secondary | ICD-10-CM

## 2015-12-12 LAB — CBC WITH DIFFERENTIAL/PLATELET
Basophils Absolute: 0.1 10*3/uL (ref 0.0–0.1)
Basophils Relative: 0.5 % (ref 0.0–3.0)
Eosinophils Absolute: 0.7 10*3/uL (ref 0.0–0.7)
Eosinophils Relative: 7.2 % — ABNORMAL HIGH (ref 0.0–5.0)
HCT: 44 % (ref 39.0–52.0)
Hemoglobin: 15 g/dL (ref 13.0–17.0)
Lymphocytes Relative: 25.1 % (ref 12.0–46.0)
Lymphs Abs: 2.6 10*3/uL (ref 0.7–4.0)
MCHC: 34 g/dL (ref 30.0–36.0)
MCV: 87.7 fl (ref 78.0–100.0)
Monocytes Absolute: 1.1 10*3/uL — ABNORMAL HIGH (ref 0.1–1.0)
Monocytes Relative: 10.4 % (ref 3.0–12.0)
Neutro Abs: 5.8 10*3/uL (ref 1.4–7.7)
Neutrophils Relative %: 56.8 % (ref 43.0–77.0)
Platelets: 294 10*3/uL (ref 150.0–400.0)
RBC: 5.02 Mil/uL (ref 4.22–5.81)
RDW: 14.1 % (ref 11.5–15.5)
WBC: 10.2 10*3/uL (ref 4.0–10.5)

## 2015-12-12 LAB — BASIC METABOLIC PANEL
BUN: 20 mg/dL (ref 6–23)
CO2: 32 mEq/L (ref 19–32)
Calcium: 9.2 mg/dL (ref 8.4–10.5)
Chloride: 105 mEq/L (ref 96–112)
Creatinine, Ser: 1.19 mg/dL (ref 0.40–1.50)
GFR: 63.3 mL/min (ref >60.00)
Glucose, Bld: 137 mg/dL — ABNORMAL HIGH (ref 70–99)
Potassium: 4.4 mEq/L (ref 3.5–5.1)
Sodium: 141 mEq/L (ref 135–145)

## 2015-12-12 LAB — BRAIN NATRIURETIC PEPTIDE: Pro B Natriuretic peptide (BNP): 41 pg/mL (ref 0.0–100.0)

## 2015-12-12 NOTE — Patient Instructions (Signed)
We will call you with the result of your chest x-ray and bloodwork We will arrange a lung function test If the lung function testing is normal then we will arrange for heart testing I will see you back in 3-4 weeks ago over these results

## 2015-12-12 NOTE — Assessment & Plan Note (Signed)
William Howell complains of progressive dyspnea over the last several years. Objectively, he has a few crackles in the bases of his left lung and he has edema on exam. The differential diagnosis here is broad and includes lung disease, heart disease, anemia and obesity and deconditioning. While I believe that his obesity and deconditioning clearly contribute, we need to assess for evidence of cardiac disease and pulmonary disease.  I do worry about the possibility of congestive heart failure considering his physical exam findings. He has a history of asthma but his symptoms are not consistent with this. He does have a significant exposure history of the year so he is at increased risk for interstitial lung disease.  Plan: Full pulmonary function test Chest x-ray CBC ProBNP Basic metabolic panel If chest x-ray and pulmonary function test are suggestive of an underlying lung disease then he may need to have a high-resolution CT scan of the chest If all lung testing comes back negative then we will arrange for a cardiac stress test and echocardiogram Follow-up 3-4 weeks

## 2015-12-12 NOTE — Assessment & Plan Note (Signed)
Continue CPAP for now.  

## 2015-12-12 NOTE — Progress Notes (Signed)
Subjective:    Patient ID: William Howell, male    DOB: 02/15/1941, 75 y.o.   MRN: [id number]  HPI Chief Complaint  Patient presents with  . Self Referral    SOB, DOE, chest tightness, wheezing, dry hacking cough., was diagnosed with Asthma a few years ago.  wants flu shot    William Howell is a self-referral for a dry hacking cough and dyspnea.  Dyspnea: > he says whenever he walks he gets short of breath > has been going on for 4 years, getting worse during this time > was diagnosed with asthma during this time and has been treated with "allergy pills" > the allergy pills really helped associated itching, but they didn't help the breathing difficulty > when he gets short of breath he feels tightness in his chest, has wheezing > he has a rescue inhaler that helps a little > cigarette smoke and dust will make it worse > climbing hills will make it worse  He does not exercise regularly, he has gained probably 25 pounds in the last four years.  He denies chest pain, leg swelling.  Maybe has a little leg swelling.  Wheezes when he lies flat, sleeps on his side.  His father died of emphysema and a heart attack.  He has a lot of lung disease in the family.  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  They have lived in the same house for > 50 years.  There has been no damage to the house, water damage, no basement.    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 Asthma   . Diabetes mellitus type II, controlled (HCC)   . Hypertension   . Obesity   . Sleep apnea    uses CPAP     No family history on file.   Social History   Social History  . Marital status: Married    Spouse name: N/A  . Number of children: N/A  . Years of education: N/A   Occupational History  . Not on file.   Social History Main Topics  . Smoking status: Never Smoker  . Smokeless tobacco: Not on file  . Alcohol use No  . Drug use: No  . Sexual activity: Not on file   Other Topics Concern  . Not on file   Social History Narrative  . No narrative on file     No Known Allergies   Outpatient Medications Prior to Visit  Medication Sig Dispense Refill  . cetirizine (ZYRTEC) 10 MG tablet Take 1 tablet (10 mg total) by mouth daily. 30 tablet 11  . ranitidine (ZANTAC) 150 MG tablet Take 150 mg by mouth daily at 8 pm.    . VENTOLIN HFA 108 (90 Base) MCG/ACT inhaler INAHLE 1 TO 2 PUFFS INTOS THE LUNGS EVERY 6 HOURS AS NEEDED FOR WHEEZING OR SHORTNESS OF BREATH 18 g  2  . zafirlukast (ACCOLATE) 20 MG tablet TAKE ONE TABLET BY MOUTH TWICE DAILY 60 tablet 3  . fluticasone (FLONASE) 50 MCG/ACT nasal spray Place 2 sprays into both nostrils daily.     No facility-administered medications prior to visit.        Review of Systems  Constitutional: Negative for activity change, appetite change, chills, fever and unexpected weight change.  HENT: Negative for congestion, dental problem, postnasal drip, rhinorrhea, sneezing, sore throat, trouble swallowing and voice change.   Eyes: Negative for visual disturbance.  Respiratory: Positive for cough, chest tightness, shortness of breath and wheezing. Negative for choking.   Cardiovascular: Negative for chest pain and leg swelling.  Gastrointestinal: Negative for abdominal pain, nausea and vomiting.  Genitourinary: Negative for difficulty urinating.  Musculoskeletal: Negative for  arthralgias.  Skin: Negative for rash.  Psychiatric/Behavioral: Negative for behavioral problems and confusion.       Objective:   Physical Exam Vitals:   12/12/15 0953  BP: 122/82  Pulse: 78  SpO2: 96%  Weight: (!) 339 lb 6.4 oz (154 kg)  Height: 5\' 9"  (1.753 m)   RA  Gen: well appearing, no acute distress HENT: NCAT, OP clear, neck supple without masses Eyes: PERRL, EOMi Lymph: no cervical lymphadenopathy PULM: CTA B CV: RRR, no mgr, no JVD GI: BS+, soft, nontender, no hsm Derm: no rash or skin breakdown MSK: normal bulk and tone Neuro: A&Ox4, CN II-XII intact, strength 5/5 in all 4 extremities Psyche: normal mood and affect  No records available for me to review  CBC    Component Value Date/Time   WBC 13.6 (H) 06/30/2014 0824   RBC 5.19 06/30/2014 0824   HGB 15.6 06/30/2014 0824   HCT 46.9 06/30/2014 0824   PLT 282 06/30/2014 0824   MCV 90.4 06/30/2014 0824   MCH 30.1 06/30/2014 0824   MCHC 33.3 06/30/2014 0824   RDW 13.7 06/30/2014 0824   LYMPHSABS 3.5 06/30/2014 0824   MONOABS 1.2 (H) 06/30/2014 0824   EOSABS 0.7 06/30/2014 0824   BASOSABS 0.0 06/30/2014 0824         Assessment & Plan:  Dyspnea William Howell complains of progressive dyspnea over the last several years. Objectively, he has a few crackles in the bases of his left lung and he has edema on exam. The differential diagnosis here is broad and includes lung disease, heart disease, anemia and obesity and deconditioning. While I believe that his obesity and deconditioning clearly contribute, we need to assess for evidence of cardiac disease and pulmonary disease.  I do worry about the possibility of congestive heart failure considering his physical exam findings. He has a history of asthma but his symptoms are not consistent with this. He does have a significant exposure history of the year so he is at increased risk for interstitial lung disease.  Plan: Full pulmonary function test Chest  x-ray CBC ProBNP Basic metabolic panel If chest x-ray and pulmonary function test are suggestive of an underlying lung disease then he may need to have a high-resolution CT scan of the chest If all lung testing comes back negative then we will arrange for a cardiac stress test and echocardiogram Follow-up 3-4 weeks  Sleep apnea Continue CPAP for now    Current Outpatient Prescriptions:  .  aspirin EC 81 MG tablet, Take 81 mg by mouth daily., Disp: , Rfl:  .  Calcium-Vitamin D-Vitamin K 500-100-40 MG-UNT-MCG CHEW, Chew by mouth 2 (two) times daily., Disp: , Rfl:  .  cetirizine (ZYRTEC) 10 MG tablet, Take 1 tablet (10 mg total) by mouth daily., Disp: 30 tablet, Rfl: 11 .  glucosamine-chondroitin 500-400 MG tablet, Take 1 tablet by mouth 2 (two) times daily., Disp: , Rfl:  .  Multiple Vitamins-Minerals (MULTIVITAMIN WITH MINERALS) tablet, Take 1 tablet by mouth daily., Disp: , Rfl:  .  ranitidine (ZANTAC) 150 MG tablet, Take 150 mg by mouth daily at 8 pm., Disp: , Rfl:  .  VENTOLIN HFA 108 (90 Base) MCG/ACT inhaler, INAHLE 1 TO 2 PUFFS INTOS THE LUNGS EVERY 6 HOURS AS NEEDED FOR WHEEZING OR SHORTNESS OF BREATH, Disp: 18 g, Rfl: 2 .  zafirlukast (ACCOLATE) 20 MG tablet, TAKE ONE TABLET BY MOUTH TWICE DAILY, Disp: 60 tablet, Rfl: 3

## 2015-12-12 NOTE — Addendum Note (Signed)
Addended by: York RamGAY, Zeva Leber on: 12/12/2015 10:31 AM   Modules accepted: Orders

## 2015-12-14 ENCOUNTER — Telehealth: Payer: Self-pay | Admitting: Pulmonary Disease

## 2015-12-14 NOTE — Telephone Encounter (Signed)
Notes Recorded by Tommie SamsMindy S Silva, CMA on 12/13/2015 at 3:52 PM EDT [facility] ------ Notes Recorded by Lupita Leashouglas B McQuaid, MD on 12/13/2015 at 1:19 PM EDT A, Please let the patient know this was OK Thanks, B   Notes Recorded by Tommie SamsMindy S Silva, CMA on 12/13/2015 at 3:52 PM EDT [facility] ------ Notes Recorded by Lupita Leashouglas B McQuaid, MD on 12/13/2015 at 1:22 PM EDT A, Please let the patient know this showed possible chronic bronchitis. I want to see the results of his PFT before ordering anything else. I don't recommend doing anything differently now. Thanks, B ---------------------------------------------------------------------------------------------------- Spoke with pt. She is aware of results. Nothing further was needed.

## 2015-12-19 ENCOUNTER — Telehealth: Payer: Self-pay | Admitting: Pulmonary Disease

## 2015-12-19 NOTE — Telephone Encounter (Signed)
Called and spoke with pt and he is aware of labs and cxr results per BQ.  Nothing further is needed.

## 2016-01-09 ENCOUNTER — Ambulatory Visit (INDEPENDENT_AMBULATORY_CARE_PROVIDER_SITE_OTHER): Payer: Medicare Other | Admitting: Pulmonary Disease

## 2016-01-09 ENCOUNTER — Ambulatory Visit (HOSPITAL_COMMUNITY)
Admission: RE | Admit: 2016-01-09 | Discharge: 2016-01-09 | Disposition: A | Discharge status: Home / Self Care | Payer: Medicare Other | Source: Ambulatory Visit | Attending: Pulmonary Disease | Admitting: Pulmonary Disease

## 2016-01-09 ENCOUNTER — Encounter: Payer: Self-pay | Admitting: Pulmonary Disease

## 2016-01-09 VITALS — BP 146/78 | HR 73 | Ht 69.0 in | Wt 330.0 lb

## 2016-01-09 DIAGNOSIS — R0600 Dyspnea, unspecified: Secondary | ICD-10-CM | POA: Insufficient documentation

## 2016-01-09 DIAGNOSIS — G4730 Sleep apnea, unspecified: Secondary | ICD-10-CM | POA: Insufficient documentation

## 2016-01-09 DIAGNOSIS — Z23 Encounter for immunization: Secondary | ICD-10-CM | POA: Diagnosis not present

## 2016-01-09 DIAGNOSIS — J4530 Mild persistent asthma, uncomplicated: Secondary | ICD-10-CM

## 2016-01-09 DIAGNOSIS — R9389 Abnormal findings on diagnostic imaging of other specified body structures: Secondary | ICD-10-CM

## 2016-01-09 DIAGNOSIS — R938 Abnormal findings on diagnostic imaging of other specified body structures: Secondary | ICD-10-CM

## 2016-01-09 DIAGNOSIS — J45909 Unspecified asthma, uncomplicated: Secondary | ICD-10-CM | POA: Insufficient documentation

## 2016-01-09 DIAGNOSIS — R0602 Shortness of breath: Secondary | ICD-10-CM | POA: Diagnosis not present

## 2016-01-09 LAB — PULMONARY FUNCTION TEST
DL/VA % pred: 122 %
DL/VA: 5.55 ml/min/mmHg/L
DLCO unc % pred: 86 %
DLCO unc: 26.92 ml/min/mmHg
FEF 25-75 Pre: 2.22 L/sec
FEF2575-%Pred-Pre: 105 %
FEV1-%Pred-Pre: 77 %
FEV1-Pre: 2.28 L
FEV1FVC-%Pred-Pre: 110 %
FEV6-%Pred-Pre: 74 %
FEV6-Pre: 2.83 L
FEV6FVC-%Pred-Pre: 105 %
FVC-%Pred-Pre: 70 %
FVC-Pre: 2.86 L
Pre FEV1/FVC ratio: 80 %
Pre FEV6/FVC Ratio: 99 %
RV % pred: 73 %
RV: 1.84 L
TLC % pred: 74 %
TLC: 5.07 L

## 2016-01-09 MED ORDER — FLUTICASONE FUROATE-VILANTEROL 200-25 MCG/INH IN AEPB: 1.0000 | INHALATION_SPRAY | Freq: Every day | RESPIRATORY_TRACT | 0 refills | Status: DC

## 2016-01-09 MED ORDER — ALBUTEROL SULFATE (2.5 MG/3ML) 0.083% IN NEBU: 2.5000 mg | INHALATION_SOLUTION | Freq: Once | RESPIRATORY_TRACT | Status: DC

## 2016-01-09 NOTE — Assessment & Plan Note (Signed)
I believe that Mr. William Howell has asthma based on the fact that today's lung function testing showed some small airways obstruction, it should be noted that this was performed after he used albuterol in the parking lot. Today he is wheezing on exam and has been coughing more recently.  This may be an explanation for his dyspnea.  Plan: Start Breo once daily, sample given Follow-up one week to review results of CT scan and whether or not Breo is helping Continue using albuterol as needed Flu shot today

## 2016-01-09 NOTE — Progress Notes (Signed)
Subjective:    Patient ID: William Howell, male    DOB: Sep 24, 1940, 75 y.o.   MRN: [id number]  Synopsis: Referred in August 2017 for evaluation of shortness of breath September 2017 lung function testing ratio 80%, FEV1 2.28 L 77% predicted, FVC 2.86 L 70% predicted, total lung capacity 5.07 L 74% predicted, DLCO 26.9 286% predicted August 2017 proBNP normal August 2017 chemistry with slightly elevated bicarbonate, hemoglobin 15.0  HPI Chief Complaint  Patient presents with  . Follow-up    review PFT.  pt c/o unchanged DOE.     William Howell is here to see me today regarding his dyspnea.  He apparently took ventolin this morning before the test. He has been coughing more than normal, just dry cough. Some sinus drainage.  Still having some shortness of breath when he exerts himself in the yard. Recently he's been exposed to a lot of dust which is made him cough more, no mucus production. He says he will go for weeks without using the albuterol then there will be times or he needs a more frequent.   Past Medical History:  Diagnosis Date  . Asthma   . Diabetes mellitus type II, controlled (HCC)   . Hypertension   . Obesity   . Sleep apnea    uses CPAP      Review of Systems     Objective:   Physical Exam Vitals:   01/09/16 1140  BP: (!) 146/78  Pulse: 73  SpO2: 96%  Weight: (!) 330 lb (149.7 kg)  Height: 5\' 9"  (1.753 m)   RA  Gen: well appearing HENT: OP clear,neck supple PULM: Exp wheezing bilaterally, normal air movement, normal percussion CV: RRR, no mgr, trace edema GI: BS+, soft, nontender Derm: no cyanosis or rash Psyche: normal mood and affect  Labs reviewed, see above  August 2017 chest x-ray images personally reviewed showing what appears to be increased interstitial markings bilaterally, possibly peribronchial cuffing or mucus plugging.    Assessment & Plan:  Asthma, chronic I believe that William Howell has asthma based on the fact that today's  lung function testing showed some small airways obstruction, it should be noted that this was performed after he used albuterol in the parking lot. Today he is wheezing on exam and has been coughing more recently.  This may be an explanation for his dyspnea.  Plan: Start Breo once daily, sample given Follow-up one week to review results of CT scan and whether or not Breo is helping Continue using albuterol as needed Flu shot today  Dyspnea Lung function testing today showed some small airway obstruction but also mild restriction with a low expiratory reserve volume. I think this is due primarily to his obesity. However, his chest x-ray had a surprising amount of interstitial changes on my and personal review set think it's worthwhile to get a high-resolution CT scan of the chest to ensure there is no other evidence of lung disease.  If he has not seen improvement in dyspnea with the addition of asthma treatment (Breo) then when he returns in a week and would like for him to be referred for cardiology evaluation.   It may be that his dyspnea is just from obesity and deconditioning, but he is at increased risk for cardiac disease given his age and obesity.    Current Outpatient Prescriptions:  .  aspirin EC 81 MG tablet, Take 81 mg by mouth daily., Disp: , Rfl:  .  Calcium-Vitamin D-Vitamin K 500-100-40  MG-UNT-MCG CHEW, Chew by mouth 2 (two) times daily., Disp: , Rfl:  .  cetirizine (ZYRTEC) 10 MG tablet, Take 1 tablet (10 mg total) by mouth daily., Disp: 30 tablet, Rfl: 11 .  glucosamine-chondroitin 500-400 MG tablet, Take 1 tablet by mouth 2 (two) times daily., Disp: , Rfl:  .  Multiple Vitamins-Minerals (MULTIVITAMIN WITH MINERALS) tablet, Take 1 tablet by mouth daily., Disp: , Rfl:  .  ranitidine (ZANTAC) 150 MG tablet, Take 150 mg by mouth daily at 8 pm., Disp: , Rfl:  .  VENTOLIN HFA 108 (90 Base) MCG/ACT inhaler, INAHLE 1 TO 2 PUFFS INTOS THE LUNGS EVERY 6 HOURS AS NEEDED FOR WHEEZING  OR SHORTNESS OF BREATH, Disp: 18 g, Rfl: 2 .  zafirlukast (ACCOLATE) 20 MG tablet, TAKE ONE TABLET BY MOUTH TWICE DAILY, Disp: 60 tablet, Rfl: 3 .  fluticasone furoate-vilanterol (BREO ELLIPTA) 200-25 MCG/INH AEPB, Inhale 1 puff into the lungs daily., Disp: 1 each, Rfl: 0 No current facility-administered medications for this visit.   Facility-Administered Medications Ordered in Other Visits:  .  albuterol (PROVENTIL) (2.5 MG/3ML) 0.083% nebulizer solution 2.5 mg, 2.5 mg, Nebulization, Once, Lupita Leash, MD

## 2016-01-09 NOTE — Patient Instructions (Signed)
Take Breo 1 puff daily no matter how you feel Use albuterol as needed for chest tightness, shortness of breath, or wheezing We will arrange for a high-resolution CT scan of your chest We will have you follow-up with our nurse practitioner in 1 week to discuss the [facility]

## 2016-01-09 NOTE — Assessment & Plan Note (Signed)
Lung function testing today showed some small airway obstruction but also mild restriction with a low expiratory reserve volume. I think this is due primarily to his obesity. However, his chest x-ray had a surprising amount of interstitial changes on my and personal review set think it's worthwhile to get a high-resolution CT scan of the chest to ensure there is no other evidence of lung disease.  If he has not seen improvement in dyspnea with the addition of asthma treatment (Breo) then when he returns in a week and would like for him to be referred for cardiology evaluation.   It may be that his dyspnea is just from obesity and deconditioning, but he is at increased risk for cardiac disease given his age and obesity.

## 2016-01-15 ENCOUNTER — Encounter: Payer: Self-pay | Admitting: Adult Health

## 2016-01-15 ENCOUNTER — Ambulatory Visit (INDEPENDENT_AMBULATORY_CARE_PROVIDER_SITE_OTHER): Payer: Medicare Other | Admitting: Adult Health

## 2016-01-15 ENCOUNTER — Other Ambulatory Visit (INDEPENDENT_AMBULATORY_CARE_PROVIDER_SITE_OTHER): Payer: Medicare Other

## 2016-01-15 ENCOUNTER — Ambulatory Visit (INDEPENDENT_AMBULATORY_CARE_PROVIDER_SITE_OTHER)
Admission: RE | Admit: 2016-01-15 | Discharge: 2016-01-15 | Disposition: A | Discharge status: Home / Self Care | Payer: Medicare Other | Source: Ambulatory Visit | Attending: Pulmonary Disease | Admitting: Pulmonary Disease

## 2016-01-15 VITALS — BP 134/82 | HR 74 | Temp 97.5°F | Ht 69.0 in | Wt 331.0 lb

## 2016-01-15 DIAGNOSIS — J45909 Unspecified asthma, uncomplicated: Secondary | ICD-10-CM | POA: Diagnosis not present

## 2016-01-15 DIAGNOSIS — G4733 Obstructive sleep apnea (adult) (pediatric): Secondary | ICD-10-CM

## 2016-01-15 DIAGNOSIS — R938 Abnormal findings on diagnostic imaging of other specified body structures: Secondary | ICD-10-CM | POA: Diagnosis not present

## 2016-01-15 DIAGNOSIS — R0602 Shortness of breath: Secondary | ICD-10-CM | POA: Diagnosis not present

## 2016-01-15 DIAGNOSIS — R9389 Abnormal findings on diagnostic imaging of other specified body structures: Secondary | ICD-10-CM

## 2016-01-15 LAB — SEDIMENTATION RATE: Sed Rate: 22 mm/hr — ABNORMAL HIGH (ref 0–20)

## 2016-01-15 NOTE — Patient Instructions (Addendum)
Continue on Breo 1 puff daily .  Labs today .  Continue on CPAP At bedtime   CPAP Download- [facility]  Follow up Dr. Kendrick FriesMcQuaid in 6-8 weeks and As needed

## 2016-01-15 NOTE — Assessment & Plan Note (Signed)
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  Will check RAST/igE panel Unable to complete FeNO testing in office.  CT chest did not show ILD changes, did show some ground glass attenuation ? Significance .    Plan Check labs today  Cont on BREO  follow up in 6-8 weeks

## 2016-01-15 NOTE — Progress Notes (Signed)
Subjective:    Patient ID: William Howell, male    DOB: 13-Dec-1940, 75 y.o.   MRN: [id number]  HPI 75 yo male never smoker seen for pulmonary consult 11/2015 for dyspnea and dry cough  Jimmye Norman (worked in Higher education careers adviser, dust, some Biochemist, clinical . NO chickens, pigeons)  Has OSA on CPAP   TEST  September 2017 lung function testing ratio 80%, FEV1 2.28 L 77% predicted, FVC 2.86 L 70% predicted, total lung capacity 5.07 L 74% predicted, DLCO 26.9 286% predicted August 2017 proBNP normal August 2017 chemistry with slightly elevated bicarbonate, hemoglobin 15.0    01/15/2016 Follow up : Asthma  Pt returns for 1 week follow up . Seen last ov , started on BREO for suspected Asthma. He has some small airway obstruction on PFT . CXR did show some interstitial changes. He was set up for a  High resolution CT chest that showed scattered patchy nonspecific pulmonary parenchymal groundglass attenuation. No definite evidence of interstitial lung disease. Was unable to do exhaled nitric oxide test in office today. He has not noticed much change in breathing since starting on BREO . He is on Accolate, Zyrtec and Zantac for trigger control. He denies significant GERD or drainage.  Says his DOE has been slowly progressive over last 5 years. Does not do much walking . No exercise..  Weight is up 20lbs over last 5 lbs. Wt today is 331lbs.  He denies chest pain, orthopnea, PND, or increased leg swelling  He is on C Pap at bedtime. Patient says no one is following this. Has had it for  4 to 5 years.    Past Medical History:  Diagnosis Date  . Asthma   . Diabetes mellitus type II, controlled (HCC)   . Hypertension   . Obesity   . Sleep apnea    uses CPAP   Current Outpatient Prescriptions on File Prior to Visit  Medication Sig Dispense Refill  . aspirin EC 81 MG tablet Take 81 mg by mouth daily.    . Calcium-Vitamin D-Vitamin K 500-100-40 MG-UNT-MCG CHEW Chew by mouth 2 (two) times daily.    . cetirizine  (ZYRTEC) 10 MG tablet Take 1 tablet (10 mg total) by mouth daily. 30 tablet 11  . fluticasone furoate-vilanterol (BREO ELLIPTA) 200-25 MCG/INH AEPB Inhale 1 puff into the lungs daily. 1 each 0  . glucosamine-chondroitin 500-400 MG tablet Take 1 tablet by mouth 2 (two) times daily.    . Multiple Vitamins-Minerals (MULTIVITAMIN WITH MINERALS) tablet Take 1 tablet by mouth daily.    . ranitidine (ZANTAC) 150 MG tablet Take 150 mg by mouth daily at 8 pm.    . VENTOLIN HFA 108 (90 Base) MCG/ACT inhaler INAHLE 1 TO 2 PUFFS INTOS THE LUNGS EVERY 6 HOURS AS NEEDED FOR WHEEZING OR SHORTNESS OF BREATH 18 g 2  . zafirlukast (ACCOLATE) 20 MG tablet TAKE ONE TABLET BY MOUTH TWICE DAILY 60 tablet 3   No current facility-administered medications on file prior to visit.     Review of Systems Constitutional:   No  weight loss, night sweats,  Fevers, chills,  +fatigue, or  lassitude.  HEENT:   No headaches,  Difficulty swallowing,  Tooth/dental problems, or  Sore throat,                No sneezing, itching, ear ache, nasal congestion, post nasal drip,   CV:  No chest pain,  Orthopnea, PND, swelling in lower extremities, anasarca, dizziness, palpitations, syncope.   GI  No heartburn, indigestion, abdominal pain, nausea, vomiting, diarrhea, change in bowel habits, loss of appetite, bloody stools.   Resp:   No chest wall deformity  Skin: no rash or lesions.  GU: no dysuria, change in color of urine, no urgency or frequency.  No flank pain, no hematuria   MS:  No joint pain or swelling.  No decreased range of motion.  No back pain.  Psych:  No change in mood or affect. No depression or anxiety.  No memory loss.         Objective:   Physical Exam Vitals:   01/15/16 1042  BP: 134/82  Pulse: 74  Temp: 97.5 F (36.4 C)  TempSrc: Oral  SpO2: 97%  Weight: (!) 331 lb (150.1 kg)  Height: 5\' 9"  (1.753 m)  Body mass index is 48.88 kg/m.   GEN: A/Ox3; pleasant , NAD, obese    HEENT:  [place]/AT,   EACs-clear, TMs-wnl, NOSE-clear, THROAT-clear, no lesions, no postnasal drip or exudate noted. Class 2-3  MP airway   NECK:  Supple w/ fair ROM; no JVD; normal carotid impulses w/o bruits; no thyromegaly or nodules palpated; no lymphadenopathy.    RESP  Clear  P & A; w/o, wheezes/ rales/ or rhonchi. no accessory muscle use, no dullness to percussion  CARD:  RRR, no m/r/g  , tr  peripheral edema, pulses intact, no cyanosis or clubbing.  GI:   Soft & nt; nml bowel sounds; no organomegaly or masses detected.   Musco: Warm bil, no deformities or joint swelling noted.   Neuro: alert, no focal deficits noted.    Skin: Warm, no lesions or rashes  Tammy Parrett NP-C  [facility]  01/15/2016         Assessment & Plan:

## 2016-01-15 NOTE — Assessment & Plan Note (Signed)
Cont on CPAP  Try to get download.

## 2016-01-16 LAB — RESPIRATORY ALLERGY PROFILE REGION II ~~LOC~~
Allergen, A. alternata, m6: <0.1 kU/L
Allergen, C. Herbarum, M2: <0.1 kU/L
Allergen, Cedar tree, t12: <0.1 kU/L
Allergen, Comm Silver Birch, t9: <0.1 kU/L
Allergen, Cottonwood, t14: <0.1 kU/L
Allergen, D pternoyssinus,d7: <0.1 kU/L
Allergen, Mouse Urine Protein, e78: <0.1 kU/L
Allergen, Mulberry, t76: <0.1 kU/L
Allergen, Oak,t7: <0.1 kU/L
Allergen, P. notatum, m1: <0.1 kU/L
Aspergillus fumigatus, m3: <0.1 kU/L
Bermuda Grass: <0.1 kU/L
Box Elder IgE: <0.1 kU/L
Cat Dander: <0.1 kU/L
Cockroach: <0.1 kU/L
Common Ragweed: <0.1 kU/L
D. farinae: <0.1 kU/L
Dog Dander: <0.1 kU/L
Elm IgE: <0.1 kU/L
IgE (Immunoglobulin E), Serum: 72 kU/L (ref <115)
Johnson Grass: <0.1 kU/L
Pecan/Hickory Tree IgE: <0.1 kU/L
Rough Pigweed  IgE: <0.1 kU/L
Sheep Sorrel IgE: <0.1 kU/L
Timothy Grass: <0.1 kU/L

## 2016-01-17 ENCOUNTER — Telehealth: Payer: Self-pay | Admitting: Pulmonary Disease

## 2016-01-17 MED ORDER — FLUTICASONE FUROATE-VILANTEROL 200-25 MCG/INH IN AEPB: 1.0000 | INHALATION_SPRAY | Freq: Every day | RESPIRATORY_TRACT | 6 refills | Status: DC

## 2016-01-17 NOTE — Telephone Encounter (Signed)
Called and spoke with pts wife and she is aware of refill of the breo that has been sent to his pharmacy. Nothing further is needed.

## 2016-01-17 NOTE — Progress Notes (Signed)
lmtcb x1 

## 2016-01-18 NOTE — Progress Notes (Signed)
Called spoke with pt. Reviewed results and recs. Pt voiced understanding and had no further questions.

## 2016-01-22 NOTE — Progress Notes (Signed)
Reviewed, I agree with this plan of care 

## 2016-02-19 ENCOUNTER — Other Ambulatory Visit: Payer: Medicare Other

## 2016-02-19 ENCOUNTER — Ambulatory Visit (INDEPENDENT_AMBULATORY_CARE_PROVIDER_SITE_OTHER): Payer: Medicare Other | Admitting: Family Medicine

## 2016-02-19 ENCOUNTER — Encounter: Payer: Self-pay | Admitting: Family Medicine

## 2016-02-19 VITALS — BP 162/102 | HR 88 | Temp 98.3°F | Resp 20 | Ht 69.0 in | Wt 335.0 lb

## 2016-02-19 DIAGNOSIS — J4530 Mild persistent asthma, uncomplicated: Secondary | ICD-10-CM

## 2016-02-19 DIAGNOSIS — Z79899 Other long term (current) drug therapy: Secondary | ICD-10-CM | POA: Diagnosis not present

## 2016-02-19 DIAGNOSIS — Z0000 Encounter for general adult medical examination without abnormal findings: Secondary | ICD-10-CM

## 2016-02-19 DIAGNOSIS — E6609 Other obesity due to excess calories: Secondary | ICD-10-CM | POA: Diagnosis not present

## 2016-02-19 DIAGNOSIS — Z1211 Encounter for screening for malignant neoplasm of colon: Secondary | ICD-10-CM | POA: Insufficient documentation

## 2016-02-19 DIAGNOSIS — G4730 Sleep apnea, unspecified: Secondary | ICD-10-CM | POA: Diagnosis not present

## 2016-02-19 DIAGNOSIS — Z125 Encounter for screening for malignant neoplasm of prostate: Secondary | ICD-10-CM | POA: Diagnosis not present

## 2016-02-19 LAB — LIPID PANEL
Cholesterol: 146 mg/dL (ref <200)
HDL: 44 mg/dL (ref >40)
LDL Cholesterol: 82 mg/dL
Total CHOL/HDL Ratio: 3.3 Ratio (ref <5.0)
Triglycerides: 102 mg/dL (ref <150)
VLDL: 20 mg/dL (ref <30)

## 2016-02-19 LAB — COMPLETE METABOLIC PANEL WITH GFR
ALT: 20 U/L (ref 9–46)
AST: 19 U/L (ref 10–35)
Albumin: 4.5 g/dL (ref 3.6–5.1)
Alkaline Phosphatase: 70 U/L (ref 40–115)
BUN: 18 mg/dL (ref 7–25)
CO2: 27 mmol/L (ref 20–31)
Calcium: 9.7 mg/dL (ref 8.6–10.3)
Chloride: 102 mmol/L (ref 98–110)
Creat: 1.26 mg/dL — ABNORMAL HIGH (ref 0.70–1.18)
GFR, Est African American: 64 mL/min (ref >=60)
GFR, Est Non African American: 55 mL/min — ABNORMAL LOW (ref >=60)
Glucose, Bld: 137 mg/dL — ABNORMAL HIGH (ref 70–99)
Potassium: 4.5 mmol/L (ref 3.5–5.3)
Sodium: 140 mmol/L (ref 135–146)
Total Bilirubin: 0.6 mg/dL (ref 0.2–1.2)
Total Protein: 7 g/dL (ref 6.1–8.1)

## 2016-02-19 LAB — PSA: PSA: 2.3 ng/mL (ref <=4.0)

## 2016-02-19 MED ORDER — LOSARTAN POTASSIUM 50 MG PO TABS: 50.0000 mg | ORAL_TABLET | Freq: Every day | ORAL | 3 refills | Status: DC

## 2016-02-19 NOTE — Progress Notes (Signed)
Subjective:    Patient ID: William Howell, male    DOB: Mar 02, 1941, 75 y.o.   MRN: [id number]  HPI Is here today for complete physical exam. He denies any concerns. He is overdue for colonoscopy as he has never had one. Patient's immunizations are up-to-date: Immunization History  Administered Date(s) Administered  . Influenza,inj,Quad PF,36+ Mos 02/10/2014, 03/14/2015, 01/09/2016  . Pneumococcal Conjugate-13 07/04/2014  . Pneumococcal Polysaccharide-23 12/26/2011  . Td 12/26/2011  . Tdap 12/26/2011  . Zoster 02/10/2014   He denies any concerns however his blood pressure today is substantially elevated. His blood pressure at home is 144/86. I reviewed recent office visits with the pulmonologist office where his blood pressure was 146/86. It seems his blood pressures averaging between 135 and 146 over mid 80s. Patient denies any chest pain. Shortness of breath is better since the pulmonologist started him on [place]Brea. He is not losing weight. He is compliant with the sleep apnea/CPAP machine. He is not exercising. He is not watching any particular diet. He denies any depression or falls. He denies any memory loss.  Past Medical History:  Diagnosis Date  . Asthma   . Diabetes mellitus type II, controlled (HCC)   . Hypertension   . Obesity   . Sleep apnea    uses CPAP   Past Surgical History:  Procedure Laterality Date  . CHOLECYSTECTOMY     Current Outpatient Prescriptions on File Prior to Visit  Medication Sig Dispense Refill  . aspirin EC 81 MG tablet Take 81 mg by mouth daily.    . Calcium-Vitamin D-Vitamin K 500-100-40 MG-UNT-MCG CHEW Chew by mouth 2 (two) times daily.    . cetirizine (ZYRTEC) 10 MG tablet Take 1 tablet (10 mg total) by mouth daily. 30 tablet 11  . fluticasone furoate-vilanterol (BREO ELLIPTA) 200-25 MCG/INH AEPB Inhale 1 puff into the lungs daily. 28 each 6  . glucosamine-chondroitin 500-400 MG tablet Take 1 tablet by mouth 2 (two) times daily.    . Multiple  Vitamins-Minerals (MULTIVITAMIN WITH MINERALS) tablet Take 1 tablet by mouth daily.    . ranitidine (ZANTAC) 150 MG tablet Take 150 mg by mouth daily at 8 pm.    . VENTOLIN HFA 108 (90 Base) MCG/ACT inhaler INAHLE 1 TO 2 PUFFS INTOS THE LUNGS EVERY 6 HOURS AS NEEDED FOR WHEEZING OR SHORTNESS OF BREATH 18 g 2  . zafirlukast (ACCOLATE) 20 MG tablet TAKE ONE TABLET BY MOUTH TWICE DAILY 60 tablet 3   No current facility-administered medications on file prior to visit.    No Known Allergies Social History   Social History  . Marital status: Married    Spouse name: N/A  . Number of children: N/A  . Years of education: N/A   Occupational History  . Not on file.   Social History Main Topics  . Smoking status: Never Smoker  . Smokeless tobacco: Not on file  . Alcohol use No  . Drug use: No  . Sexual activity: Not on file   Other Topics Concern  . Not on file   Social History Narrative  . No narrative on file   No family history on file.    Review of Systems  All other systems reviewed and are negative.      Objective:   Physical Exam  Constitutional: He is oriented to person, place, and time. He appears well-developed and well-nourished. No distress.  HENT:  Head: Normocephalic and atraumatic.  Right Ear: External ear normal.  Left Ear: External  ear normal.  Nose: Nose normal.  Mouth/Throat: Oropharynx is clear and moist. No oropharyngeal exudate.  Eyes: Conjunctivae and EOM are normal. Pupils are equal, round, and reactive to light. Right eye exhibits no discharge. Left eye exhibits no discharge. No scleral icterus.  Neck: Normal range of motion. Neck supple. No JVD present. No tracheal deviation present. No thyromegaly present.  Cardiovascular: Normal rate, regular rhythm, normal heart sounds and intact distal pulses.  Exam reveals no gallop and no friction rub.   No murmur heard. Pulmonary/Chest: Effort normal and breath sounds normal. No stridor. No respiratory  distress. He has no wheezes. He has no rales. He exhibits no tenderness.  Abdominal: Soft. Bowel sounds are normal. He exhibits no distension and no mass. There is no tenderness. There is no rebound and no guarding.  Musculoskeletal: Normal range of motion. He exhibits no edema or tenderness.  Lymphadenopathy:    He has no cervical adenopathy.  Neurological: He is alert and oriented to person, place, and time. He has normal reflexes. No cranial nerve deficit. He exhibits normal muscle tone. Coordination normal.  Skin: Skin is warm. No rash noted. He is not diaphoretic. No erythema. No pallor.  Psychiatric: He has a normal mood and affect. His behavior is normal. Judgment and thought content normal.  Vitals reviewed.         Assessment & Plan:  Routine general medical examination at a health care facility - Plan: COMPLETE METABOLIC PANEL WITH GFR, Lipid panel, PSA, Ambulatory referral to Gastroenterology  Sleep apnea, unspecified type  Mild persistent chronic asthma without complication  Obesity due to excess calories, unspecified classification, unspecified whether serious comorbidity present  Screen for colon cancer - Plan: Ambulatory referral to Gastroenterology  Patient's immunizations are up-to-date. I will schedule the patient to meet with the gastroenterologist for a colonoscopy. I will check a PSA to screen for prostate cancer. I will also check a CMP to monitor his blood sugar as well as his renal function and liver function test. I'll also check a fasting lipid panel. I continue to encourage diet less than 1500 cal per day, 30 minutes a day 5 days a week of aerobic exercise, and substantial weight loss. Begin losartan 50 mg by mouth daily for hypertension and recheck blood pressure in one month

## 2016-02-27 ENCOUNTER — Other Ambulatory Visit: Payer: Self-pay | Admitting: Family Medicine

## 2016-02-27 DIAGNOSIS — R739 Hyperglycemia, unspecified: Secondary | ICD-10-CM

## 2016-03-11 ENCOUNTER — Encounter: Payer: Self-pay | Admitting: Pulmonary Disease

## 2016-03-11 ENCOUNTER — Ambulatory Visit (INDEPENDENT_AMBULATORY_CARE_PROVIDER_SITE_OTHER): Payer: Medicare Other | Admitting: Pulmonary Disease

## 2016-03-11 VITALS — BP 142/76 | HR 76 | Ht 69.0 in | Wt 333.0 lb

## 2016-03-11 DIAGNOSIS — G4730 Sleep apnea, unspecified: Secondary | ICD-10-CM | POA: Diagnosis not present

## 2016-03-11 DIAGNOSIS — J4542 Moderate persistent asthma with status asthmaticus: Secondary | ICD-10-CM | POA: Diagnosis not present

## 2016-03-11 NOTE — Progress Notes (Signed)
Subjective:    Patient ID: William Howell, male    DOB: [birth year], 75 y.o.   MRN: [id number]  Synopsis: Referred in August 2017 for evaluation of shortness of breath.  He has asthma and obstructive sleep apnea.  September 2017 lung function testing ratio 80%, FEV1 2.28 L 77% predicted, FVC 2.86 L 70% predicted, total lung capacity 5.07 L 74% predicted, DLCO 26.9 286% predicted August 2017 proBNP normal August 2017 chemistry with slightly elevated bicarbonate, hemoglobin 15.0 October 2017 high-resolution CT chest images showing air-trapping consistent with small airways disease, some mild scattered patchy groundglass but nothing to suggest underlying interstitial lung disease. 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  HPI Chief Complaint  Patient presents with  . Follow-up    pt states his breathing is overall better, notices some hoarseness with using Breo.     William Howell says that [facility] and he hasn't had to use his rescue inhaler.  No mucus production, no cough.  His breathing has improved to some degree as well.  He has obstructive sleep apnea and uses CPAP nightly.  He needs new supplies for his CPAP machine.  He has the information. The machine has been working.  He has some dry mouth in the mornings.  He says that he uses the machine regularly .  His sleep study was 5 years ago. He had the sleep study.    Past Medical History:  Diagnosis Date  . Asthma   . Diabetes mellitus type II, controlled (HCC)   . Hypertension   . Obesity   . Sleep apnea    uses CPAP      Review of Systems  Constitutional: Negative for chills, fatigue and fever.  HENT: Negative for postnasal drip, rhinorrhea, sinus pain and sinus pressure.   Respiratory: Negative for cough, shortness of breath and wheezing.   Cardiovascular: Negative for chest pain, palpitations and leg swelling.       Objective:   Physical Exam Vitals:   03/11/16 0941  BP: (!)  142/76  Pulse: 76  SpO2: 98%  Weight: (!) 333 lb (151 kg)  Height: 5\' 9"  (1.753 m)   RA  Gen: well appearing HENT: OP clear,neck supple PULM: Exp wheezing bilaterally, normal air movement, normal percussion CV: RRR, no mgr, trace edema GI: BS+, soft, nontender Derm: no cyanosis or rash Psyche: normal mood and affect  Records from his visit with our nurse practitioner in October 2017 reviewed were [facility] October 2017 high-resolution CT chest images personally reviewed showing air-trapping consistent with small airways disease, some mild scattered patchy groundglass but nothing to suggest underlying interstitial lung disease. 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    Assessment & Plan:  Asthma, chronic I reviewed the records from his CT scan and looked at the images myself. I see no evidence of an underlying lung disease other than findings consistent with asthma. Fortunately, his asthma is well-controlled on Breo. At this time we'll continue treating [facility] on a daily basis though I advised him today we may be able to find seasons throughout the year where he does not need it.  Plan: Continue Breo Continue as needed albuterol Follow-up in the spring, if things have been stable we may consider giving him a break from the [place].  Sleep apnea He has obstructive sleep apnea and has been using CPAP for approximately 5 years. Today we reviewed the records from his compliance  report most recently which shows outstanding compliance. He uses the machine 98% of days tested. He is currently using BiPAP 16/10.  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  aspirin EC 81 MG tablet, Take 81 mg by mouth daily., Disp: , Rfl:  .  Calcium-Vitamin D-Vitamin K 500-100-40 MG-UNT-MCG CHEW, Chew by mouth 2 (two) times  daily., Disp: , Rfl:  .  cetirizine (ZYRTEC) 10 MG tablet, Take 1 tablet (10 mg total) by mouth daily., Disp: 30 tablet, Rfl: 11 .  fluticasone furoate-vilanterol (BREO ELLIPTA) 200-25 MCG/INH AEPB, Inhale 1 puff into the lungs daily., Disp: 28 each, Rfl: 6 .  glucosamine-chondroitin 500-400 MG tablet, Take 1 tablet by mouth 2 (two) times daily., Disp: , Rfl:  .  losartan (COZAAR) 50 MG tablet, Take 1 tablet (50 mg total) by mouth daily., Disp: 90 tablet, Rfl: 3 .  Multiple Vitamins-Minerals (MULTIVITAMIN WITH MINERALS) tablet, Take 1 tablet by mouth daily., Disp: , Rfl:  .  ranitidine (ZANTAC) 150 MG tablet, Take 150 mg by mouth daily at 8 pm., Disp: , Rfl:  .  VENTOLIN HFA 108 (90 Base) MCG/ACT inhaler, INAHLE 1 TO 2 PUFFS INTOS THE LUNGS EVERY 6 HOURS AS NEEDED FOR WHEEZING OR SHORTNESS OF BREATH, Disp: 18 g, Rfl: 2 .  zafirlukast (ACCOLATE) 20 MG tablet, TAKE ONE TABLET BY MOUTH TWICE DAILY, Disp: 60 tablet, Rfl: 3

## 2016-03-11 NOTE — Assessment & Plan Note (Signed)
He has obstructive sleep apnea and has been using CPAP for approximately 5 years. Today we reviewed the records from his compliance report most recently which shows outstanding compliance. He uses the machine 98% of days tested. He is currently using BiPAP 16/10.  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

## 2016-03-11 NOTE — Assessment & Plan Note (Signed)
I reviewed the records from his CT scan and looked at the images myself. I see no evidence of an underlying lung disease other than findings consistent with asthma. Fortunately, his asthma is well-controlled on Breo. At this time [facility] though I advised him today we may be able to find seasons throughout the year where he does not need it.  Plan: Continue Breo Continue as needed albuterol Follow-up in the spring, if things have been stable we may consider giving him a break from the [place].

## 2016-03-11 NOTE — Patient Instructions (Signed)
[facility] Keep taking Breo everyday no matter how you feel I will see you back in the spring of 2018, April

## 2016-03-15 DIAGNOSIS — G4730 Sleep apnea, unspecified: Secondary | ICD-10-CM | POA: Diagnosis not present

## 2016-03-15 DIAGNOSIS — G4731 Primary central sleep apnea: Secondary | ICD-10-CM | POA: Diagnosis not present

## 2016-04-11 ENCOUNTER — Encounter: Payer: Self-pay | Admitting: Acute Care

## 2016-04-11 ENCOUNTER — Ambulatory Visit (INDEPENDENT_AMBULATORY_CARE_PROVIDER_SITE_OTHER): Payer: Medicare Other | Admitting: Acute Care

## 2016-04-11 ENCOUNTER — Ambulatory Visit (INDEPENDENT_AMBULATORY_CARE_PROVIDER_SITE_OTHER)
Admission: RE | Admit: 2016-04-11 | Discharge: 2016-04-11 | Disposition: A | Discharge status: Home / Self Care | Payer: Medicare Other | Source: Ambulatory Visit | Attending: Acute Care | Admitting: Acute Care

## 2016-04-11 VITALS — BP 126/84 | HR 88 | Temp 98.2°F | Ht 69.0 in | Wt 334.6 lb

## 2016-04-11 DIAGNOSIS — R05 Cough: Secondary | ICD-10-CM | POA: Diagnosis not present

## 2016-04-11 DIAGNOSIS — R0600 Dyspnea, unspecified: Secondary | ICD-10-CM

## 2016-04-11 DIAGNOSIS — J209 Acute bronchitis, unspecified: Secondary | ICD-10-CM | POA: Diagnosis not present

## 2016-04-11 DIAGNOSIS — J4542 Moderate persistent asthma with status asthmaticus: Secondary | ICD-10-CM

## 2016-04-11 DIAGNOSIS — R0602 Shortness of breath: Secondary | ICD-10-CM | POA: Diagnosis not present

## 2016-04-11 MED ORDER — HYDROCODONE-HOMATROPINE 5-1.5 MG/5ML PO SYRP: 5.0000 mL | ORAL_SOLUTION | Freq: Four times a day (QID) | ORAL | 0 refills | Status: DC | PRN

## 2016-04-11 MED ORDER — AMOXICILLIN-POT CLAVULANATE 875-125 MG PO TABS: 1.0000 | ORAL_TABLET | Freq: Two times a day (BID) | ORAL | 0 refills | Status: DC

## 2016-04-11 MED ORDER — PREDNISONE 10 MG PO TABS: ORAL_TABLET | ORAL | 0 refills | Status: DC

## 2016-04-11 NOTE — Progress Notes (Signed)
History of Present Illness William Howell is a 75 y.o. male never with dyspnea, asthma and OSA. He is followed by Dr, Kendrick FriesMcQuaid.  Synopsis: Referred in August 2017 for evaluation of shortness of breath.  He has asthma and obstructive sleep apnea.  September 2017 lung function testing ratio 80%, FEV1 2.28 L 77% predicted, FVC 2.86 L 70% predicted, total lung capacity 5.07 L 74% predicted, DLCO 26.9 286% predicted August 2017 proBNP normal August 2017 chemistry with slightly elevated bicarbonate, hemoglobin 15.0 October 2017 high-resolution CT chest images showing air-trapping consistent with small airways disease, some mild scattered patchy groundglass but nothing to suggest underlying interstitial lung disease. 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  04/11/2016 Acute OV for cough: Pt. Presents today for cough with fever lasting 2 weeks. He states cough has been more productive in the last 3-4 days. He has clear to brownish secretions. He states he had a fever last Thursday 04/05/16. He did not take his temperature because he did not have a thermometer. He is coughing more frequently with the weather change. He is not coughing up blood. He is compliant with his [facility]Breo which he uses every day. Since starting Breo he has not needed to use his rescue inhaler. Of note he is obese and somewhat deconditioned. He has had increased fatigue.  Tests 04/11/2016: CXR:   Past medical hx Past Medical History:  Diagnosis Date  . Asthma   . Diabetes mellitus type II, controlled (HCC)   . Hypertension   . Obesity   . Sleep apnea    uses CPAP     Past surgical hx, Family hx, Social hx all reviewed.  Current Outpatient Prescriptions on File Prior to Visit  Medication Sig  . aspirin EC 81 MG tablet Take 81 mg by mouth daily.  . Calcium-Vitamin D-Vitamin K 500-100-40 MG-UNT-MCG CHEW Chew by mouth 2 (two) times daily.  . cetirizine (ZYRTEC) 10 MG tablet Take 1 tablet (10 mg total) by  mouth daily.  . fluticasone furoate-vilanterol (BREO ELLIPTA) 200-25 MCG/INH AEPB Inhale 1 puff into the lungs daily.  Marland Kitchen. glucosamine-chondroitin 500-400 MG tablet Take 1 tablet by mouth 2 (two) times daily.  Marland Kitchen. losartan (COZAAR) 50 MG tablet Take 1 tablet (50 mg total) by mouth daily.  . Multiple Vitamins-Minerals (MULTIVITAMIN WITH MINERALS) tablet Take 1 tablet by mouth daily.  . ranitidine (ZANTAC) 150 MG tablet Take 150 mg by mouth daily at 8 pm.  . VENTOLIN HFA 108 (90 Base) MCG/ACT inhaler INAHLE 1 TO 2 PUFFS INTOS THE LUNGS EVERY 6 HOURS AS NEEDED FOR WHEEZING OR SHORTNESS OF BREATH  . zafirlukast (ACCOLATE) 20 MG tablet TAKE ONE TABLET BY MOUTH TWICE DAILY   No current facility-administered medications on file prior to visit.      No Known Allergies  Review Of Systems:  Constitutional:   No  weight loss, night sweats,  +Fevers, +chills, +fatigue, or  lassitude.  HEENT:   No headaches,  Difficulty swallowing,  Tooth/dental problems, or  Sore throat,                No sneezing, itching, ear ache, nasal congestion, post nasal drip,   CV:  No chest pain,  Orthopnea, PND, swelling in lower extremities, anasarca, dizziness, palpitations, syncope.   GI  No heartburn, indigestion, abdominal pain, nausea, vomiting, diarrhea, change in bowel habits, loss of appetite, bloody stools.   Resp: + shortness of breath with exertion or at rest.  + excess mucus, + productive cough,  No non-productive cough,  No coughing up of blood.  + change in color of mucus.  No wheezing.  No chest wall deformity  Skin: no rash or lesions.  GU: no dysuria, change in color of urine, no urgency or frequency.  No flank pain, no hematuria   MS:  No joint pain or swelling.  No decreased range of motion.  No back pain.  Psych:  No change in mood or affect. No depression or anxiety.  No memory loss.   Vital Signs BP 126/84 (BP Location: Left Arm, Patient Position: Sitting, Cuff Size: Normal)   Pulse 88   Ht 5'  9" (1.753 m)   Wt (!) 334 lb 9.6 oz (151.8 kg)   SpO2 99%   BMI 49.41 kg/m   Body mass index is 49.41 kg/m. Physical Exam:  General- No distress,  A&Ox3, obese ENT: No sinus tenderness, TM clear, pale nasal mucosa, no oral exudate,+ post nasal drip, no LAN Cardiac: S1, S2, regular rate and rhythm, no murmur Chest: No wheeze/ rales/ dullness; no accessory muscle use, no nasal flaring, no sternal retractions Abd.: Soft Non-tender Ext: No clubbing cyanosis, edema Neuro:  normal strength Skin: No rashes, warm and dry Psych: normal mood and behavior   Assessment/Plan  Acute bronchitis New onset bronchitis 2 weeks ago with fever 04/05/2016 Plan: We will do a CXR today. Prednisone taper; 10 mg tablets: 4 tabs x 2 days, 3 tabs x 2 days, 2 tabs x 2 days 1 tab x 2 days then stop. Augmentin 875 mg every 12 hours. X 7 days. Hycodan cough syrup 1 teaspoon every 8 hours as needed for cough. Do not drive if sleepy. Probiotic daily while on antibiotics ( Activia with probiotic or Over the counter Culturelle or Align) Follow up 4 weeks with Dr. Kendrick FriesMcQuaid or NP. Please contact office for sooner follow up if symptoms do not improve or worsen or seek emergency care    Asthma, chronic Continue using Breo daily. Rinse mouth after use. Albuterol as needed for breakthrough SOB or wheezing Please contact office for sooner follow up if symptoms do not improve or worsen or seek emergency care     Bevelyn NgoSarah F Groce, NP 04/11/2016  11:15 AM

## 2016-04-11 NOTE — Assessment & Plan Note (Signed)
New onset bronchitis 2 weeks ago with fever 04/05/2016 Plan: We will do a CXR today. Prednisone taper; 10 mg tablets: 4 tabs x 2 days, 3 tabs x 2 days, 2 tabs x 2 days 1 tab x 2 days then stop. Augmentin 875 mg every 12 hours. X 7 days. Hycodan cough syrup 1 teaspoon every 8 hours as needed for cough. Do not drive if sleepy. Probiotic daily while on antibiotics ( Activia with probiotic or Over the counter Culturelle or Align) Follow up 4 weeks with Dr. Kendrick FriesMcQuaid or NP. Please contact office for sooner follow up if symptoms do not improve or worsen or seek emergency care

## 2016-04-11 NOTE — Progress Notes (Signed)
Reviewed, agree 

## 2016-04-11 NOTE — Assessment & Plan Note (Signed)
Continue using Breo daily. Rinse mouth after use. Albuterol as needed for breakthrough SOB or wheezing Please contact office for sooner follow up if symptoms do not improve or worsen or seek emergency care

## 2016-04-11 NOTE — Patient Instructions (Addendum)
It is nice to meet you today. We will do a CXR today. Prednisone taper; 10 mg tablets: 4 tabs x 2 days, 3 tabs x 2 days, 2 tabs x 2 days 1 tab x 2 days then stop. Augmentin 875 mg every 12 hours. X 7 days. Hycodan cough syrup 1 teaspoon every 8 hours as needed for cough. Do not drive if sleepy. Probiotic daily while on antibiotics ( Activia with probiotic or Over the counter Culturelle or Align) Follow up 4 weeks with Dr. Kendrick FriesMcQuaid or NP. Please contact office for sooner follow up if symptoms do not improve or worsen or seek emergency care

## 2016-04-19 ENCOUNTER — Other Ambulatory Visit: Payer: Self-pay | Admitting: Family Medicine

## 2016-04-19 NOTE — Telephone Encounter (Signed)
rx filled per protocol  

## 2016-04-22 ENCOUNTER — Encounter: Payer: Self-pay | Admitting: Family Medicine

## 2016-05-13 ENCOUNTER — Ambulatory Visit: Payer: Medicare Other | Admitting: Acute Care

## 2016-06-10 ENCOUNTER — Other Ambulatory Visit: Payer: Self-pay | Admitting: Family Medicine

## 2016-07-19 ENCOUNTER — Telehealth: Payer: Self-pay | Admitting: Pulmonary Disease

## 2016-07-19 NOTE — Telephone Encounter (Signed)
Called patient and wife answered who stated patient was at work. Wife, who is on DPR, was informed to bring CPAP machine with cord or SD card. Wife stated pt will bring SD card as it is easier. She verbalized understanding. Nothing further is needed.

## 2016-07-22 ENCOUNTER — Encounter: Payer: Self-pay | Admitting: Pulmonary Disease

## 2016-07-22 ENCOUNTER — Ambulatory Visit (INDEPENDENT_AMBULATORY_CARE_PROVIDER_SITE_OTHER)
Admission: RE | Admit: 2016-07-22 | Discharge: 2016-07-22 | Disposition: A | Discharge status: Home / Self Care | Payer: Medicare Other | Source: Ambulatory Visit | Attending: Pulmonary Disease | Admitting: Pulmonary Disease

## 2016-07-22 ENCOUNTER — Ambulatory Visit (INDEPENDENT_AMBULATORY_CARE_PROVIDER_SITE_OTHER): Payer: Medicare Other | Admitting: Pulmonary Disease

## 2016-07-22 ENCOUNTER — Other Ambulatory Visit (INDEPENDENT_AMBULATORY_CARE_PROVIDER_SITE_OTHER): Payer: Medicare Other

## 2016-07-22 VITALS — BP 130/90 | HR 83 | Ht 69.0 in | Wt 336.8 lb

## 2016-07-22 DIAGNOSIS — J4542 Moderate persistent asthma with status asthmaticus: Secondary | ICD-10-CM

## 2016-07-22 DIAGNOSIS — R0600 Dyspnea, unspecified: Secondary | ICD-10-CM

## 2016-07-22 DIAGNOSIS — E6609 Other obesity due to excess calories: Secondary | ICD-10-CM

## 2016-07-22 DIAGNOSIS — G4730 Sleep apnea, unspecified: Secondary | ICD-10-CM

## 2016-07-22 DIAGNOSIS — R0602 Shortness of breath: Secondary | ICD-10-CM | POA: Diagnosis not present

## 2016-07-22 LAB — CBC WITH DIFFERENTIAL/PLATELET
Basophils Absolute: 0.1 10*3/uL (ref 0.0–0.1)
Basophils Relative: 0.7 % (ref 0.0–3.0)
Eosinophils Absolute: 0.2 10*3/uL (ref 0.0–0.7)
Eosinophils Relative: 2.1 % (ref 0.0–5.0)
HCT: 43.6 % (ref 39.0–52.0)
Hemoglobin: 14.4 g/dL (ref 13.0–17.0)
Lymphocytes Relative: 24.6 % (ref 12.0–46.0)
Lymphs Abs: 2.6 10*3/uL (ref 0.7–4.0)
MCHC: 33 g/dL (ref 30.0–36.0)
MCV: 91.2 fl (ref 78.0–100.0)
Monocytes Absolute: 1.1 10*3/uL — ABNORMAL HIGH (ref 0.1–1.0)
Monocytes Relative: 10.3 % (ref 3.0–12.0)
Neutro Abs: 6.6 10*3/uL (ref 1.4–7.7)
Neutrophils Relative %: 62.3 % (ref 43.0–77.0)
Platelets: 307 10*3/uL (ref 150.0–400.0)
RBC: 4.78 Mil/uL (ref 4.22–5.81)
RDW: 13.7 % (ref 11.5–15.5)
WBC: 10.6 10*3/uL — ABNORMAL HIGH (ref 4.0–10.5)

## 2016-07-22 MED ORDER — FLUTICASONE FUROATE-VILANTEROL 200-25 MCG/INH IN AEPB: 1.0000 | INHALATION_SPRAY | Freq: Every day | RESPIRATORY_TRACT | 11 refills | Status: DC

## 2016-07-22 NOTE — Patient Instructions (Signed)
We will order a chest x-ray and blood work to evaluate her shortness of breath and call you with the results Keep taking Breo as you are doing We will refer you to Dr. Rennis Golden for evaluation of your heart We will see you back in about 6 weeks with the nurse practitioner to see how you are doing  The following behaviors have been associated with weight loss: Weigh yourself daily Write down everything you eat Drink a glass of water prior to eating a meal

## 2016-07-22 NOTE — Assessment & Plan Note (Signed)
His download today showed good compliance. He was encouraged to continue taking this.

## 2016-07-22 NOTE — Assessment & Plan Note (Signed)
He had a flareup of his asthma back in December but since then he's been doing okay. He reports recent increased dyspnea but no asthma symptoms such as cough or wheezing going along with this. The differential diagnosis of dyspnea in him is broad. He is certainly at risk for cardiac disease. His obesity clearly contributes but this has not changed much lately.  Plan: Check lung function test (simple spirometry) Check chest x-ray Check CBC If these are normal refer to cardiology If cardiology workup is normal then focus on weight loss with diet and exercise Continue [facility]

## 2016-07-22 NOTE — Assessment & Plan Note (Signed)
See description in Asthma above

## 2016-07-22 NOTE — Progress Notes (Signed)
Subjective:    Patient ID: William Howell, male    DOB: [birth year], 76 y.o.   MRN: [id number]  Synopsis: Referred in August 2017 for evaluation of shortness of breath related to asthma.  He has asthma and obstructive sleep apnea.    HPI Chief Complaint  Patient presents with  . Follow-up    Pt states his more SOB during day; not wheezing and cough as much. CPAP machine is working well for patient    William Howell had a bad cold around Christmas and needed to see [place] for augmentin and prednisone which helped.  He says that his breathing has been worsening, specifically with exertion. He is taking Breo, he says that it is not helping as much.  It makes his hoarseness worse. He is not using his albuterol inhaler much.  He used it a few days ago when he was mowing the grass.  He really hasn't coughed more or had more wheezing.   No leg swelling, no trouble sleeping. Still using BIPAP, uses it all.  No bloody stool, no chest pain.     Past Medical History:  Diagnosis Date  . Asthma   . Diabetes mellitus type II, controlled (HCC)   . Hypertension   . Obesity   . Sleep apnea    uses CPAP      Review of Systems  Constitutional: Negative for chills, fatigue and fever.  HENT: Negative for postnasal drip, rhinorrhea, sinus pain and sinus pressure.   Respiratory: Negative for cough, shortness of breath and wheezing.   Cardiovascular: Negative for chest pain, palpitations and leg swelling.       Objective:   Physical Exam Vitals:   07/22/16 0910  BP: 130/90  Pulse: 83  SpO2: 97%  Weight: (!) 336 lb 12.8 oz (152.8 kg)  Height:  (1.753 m)   RA  Gen: well appearing HENT: OP clear, TM's clear, neck supple PULM: CTA B, normal percussion CV: RRR, no mgr, trace edema GI: BS+, soft, nontender Derm: no cyanosis or rash Psyche: normal mood and affect   Records from his December 2017 visit with pulmonary interstitial practitioner reviewed were he had bronchitis and was  treated with prednisone and Augmentin.  PFT: September 2017 lung function testing ratio 80%, FEV1 2.28 L 77% predicted, FVC 2.86 L 70% predicted, total lung capacity 5.07 L 74% predicted, DLCO 26.92 86% predicted  Labs: August 2017 proBNP normal August 2017 chemistry with slightly elevated bicarbonate, hemoglobin 15.0   Imaging: October 2017 high-resolution CT chest images personally reviewed showing air-trapping consistent with small airways disease, some mild scattered patchy groundglass but nothing to suggest underlying interstitial lung disease. 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    Assessment & Plan:  Asthma, chronic He had a flareup of his asthma back in December but since then he's been doing okay. He reports recent increased dyspnea but no asthma symptoms such as cough or wheezing going along with this. The differential diagnosis of dyspnea in him is broad. He is certainly at risk for cardiac disease. His obesity clearly contributes but this has not changed much lately.  Plan: Check lung function test (simple spirometry) Check chest x-ray Check CBC If these are normal refer to cardiology If cardiology workup is normal then focus on weight loss with diet and exercise Continue Breo  Sleep apnea His download today showed good compliance. He was encouraged to continue taking this.  Obesity Behavioral modifications reviewed and encouraged a per  Dyspnea See description  in Asthma above    Current Outpatient Prescriptions:  .  aspirin EC 81 MG tablet, Take 81 mg by mouth daily., Disp: , Rfl:  .  Calcium-Vitamin D-Vitamin K 500-100-40 MG-UNT-MCG CHEW, Chew by mouth 2 (two) times daily., Disp: , Rfl:  .  cetirizine (ZYRTEC) 10 MG tablet, TAKE ONE TABLET BY MOUTH ONCE DAILY, Disp: 30 tablet, Rfl: 11 .  fluticasone furoate-vilanterol (BREO ELLIPTA) 200-25 MCG/INH AEPB, Inhale 1 puff into the lungs daily., Disp: 28 each, Rfl: 6 .  glucosamine-chondroitin 500-400  MG tablet, Take 1 tablet by mouth 2 (two) times daily., Disp: , Rfl:  .  losartan (COZAAR) 50 MG tablet, Take 1 tablet (50 mg total) by mouth daily., Disp: 90 tablet, Rfl: 3 .  Multiple Vitamins-Minerals (MULTIVITAMIN WITH MINERALS) tablet, Take 1 tablet by mouth daily., Disp: , Rfl:  .  ranitidine (ZANTAC) 150 MG tablet, Take 150 mg by mouth daily at 8 pm., Disp: , Rfl:  .  VENTOLIN HFA 108 (90 Base) MCG/ACT inhaler, INAHLE 1 TO 2 PUFFS INTOS THE LUNGS EVERY 6 HOURS AS NEEDED FOR WHEEZING OR SHORTNESS OF BREATH, Disp: 18 g, Rfl: 2 .  zafirlukast (ACCOLATE) 20 MG tablet, TAKE ONE TABLET BY MOUTH TWICE DAILY, Disp: 180 tablet, Rfl: 3 .  amoxicillin-clavulanate (AUGMENTIN) 875-125 MG tablet, Take 1 tablet by mouth 2 (two) times daily. (Patient not taking: Reported on 07/22/2016), Disp: 14 tablet, Rfl: 0 .  HYDROcodone-homatropine (HYCODAN) 5-1.5 MG/5ML syrup, Take 5 mLs by mouth every 6 (six) hours as needed for cough. (Patient not taking: Reported on 07/22/2016), Disp: 240 mL, Rfl: 0 .  predniSONE (DELTASONE) 10 MG tablet, Take 4 tabs for 2 days, then 3 tabs for 2 days, 2 tabs for 2 days, then 1 tab for 2 days, then stop. (Patient not taking: Reported on 07/22/2016), Disp: 20 tablet, Rfl: 0

## 2016-07-22 NOTE — Addendum Note (Signed)
Addended by: Pamalee Leyden on: 07/22/2016 10:11 AM   Modules accepted: Orders

## 2016-07-22 NOTE — Assessment & Plan Note (Signed)
Behavioral modifications reviewed and encouraged a per

## 2016-08-14 ENCOUNTER — Other Ambulatory Visit: Payer: Self-pay | Admitting: Pulmonary Disease

## 2016-08-26 ENCOUNTER — Encounter: Payer: Self-pay | Admitting: Internal Medicine

## 2016-08-26 ENCOUNTER — Ambulatory Visit (INDEPENDENT_AMBULATORY_CARE_PROVIDER_SITE_OTHER): Payer: Medicare Other | Admitting: Internal Medicine

## 2016-08-26 VITALS — BP 169/89 | HR 76 | Ht 69.0 in | Wt 335.4 lb

## 2016-08-26 DIAGNOSIS — R5383 Other fatigue: Secondary | ICD-10-CM | POA: Insufficient documentation

## 2016-08-26 DIAGNOSIS — I4510 Unspecified right bundle-branch block: Secondary | ICD-10-CM | POA: Diagnosis not present

## 2016-08-26 DIAGNOSIS — R0602 Shortness of breath: Secondary | ICD-10-CM | POA: Diagnosis not present

## 2016-08-26 NOTE — Patient Instructions (Addendum)
Your physician has requested that you have an exercise stress myoview. For further information please visit [email or web address]. Please follow instruction sheet, as given.  Your physician has requested that you have an echocardiogram @ [address]. Echocardiography is a painless test that uses sound waves to create images of your heart. It provides your doctor with information about the size and shape of your heart and how well your heart's chambers and valves are working. This procedure takes approximately one hour. There are no restrictions for this procedure.  Your physician recommends that you schedule a follow-up appointment with Dr. Rennis GoldenHilty after your testing.

## 2016-08-26 NOTE — Progress Notes (Signed)
OFFICE CONSULT NOTE  Chief Complaint:  DOE, fatigue  Primary Care Physician: Donita BrooksPickard, Warren T, MD  HPI:  William Howell is a 76 y.o. male who is being seen today for the evaluation of DOE and fatigue at the request of Dr. Kendrick FriesMcQuaid. Mr. William Howell has a history of morbid obesity, type 2 diabetes, hypertension, asthma and obstructive sleep apnea on CPAP. He reports at least 3 or 4 years of significant dyspnea on exertion. His weight is been fairly stable although at one time in the past he lost more than 50 pounds on a commercial weight loss diet but regained the weight. Recently he's been working with his pulmonologist for shortness of breath. He says in general is improved however he is concerned about possible underlying coronary disease. Certainly has multiple coronary risk factors and a family history of heart attack in his father who also had pulmonary disease and it may been related to using his inhaler. He also has a sister who died of heart attack. Mr. William Howell feels short of breath when exerting himself, particularly walking up stairs or doing some physical activities. He denies any chest pain, pressure, heaviness or associated symptoms.  PMHx:  Past Medical History:  Diagnosis Date  . Asthma   . Diabetes mellitus type II, controlled (HCC)   . Hypertension   . Obesity   . Sleep apnea    uses CPAP    Past Surgical History:  Procedure Laterality Date  . CHOLECYSTECTOMY      FAMHx:  Family History  Problem Relation Age of Onset  . Heart disease Father   . Heart attack Father   . Heart attack Sister     SOCHx:   reports that he has never smoked. His smokeless tobacco use includes Chew. He reports that he does not drink alcohol or use drugs.  ALLERGIES:  No Known Allergies  ROS: Pertinent items noted in HPI and remainder of comprehensive ROS otherwise negative.  HOME MEDS: Current Outpatient Prescriptions on File Prior to Visit  Medication Sig Dispense Refill  .  aspirin EC 81 MG tablet Take 81 mg by mouth daily.    Marland Kitchen. BREO ELLIPTA 200-25 MCG/INH AEPB INHALE ONE PUFF BY MOUTH ONCE DAILY 60 each 5  . Calcium-Vitamin D-Vitamin K 500-100-40 MG-UNT-MCG CHEW Chew by mouth 2 (two) times daily.    . cetirizine (ZYRTEC) 10 MG tablet TAKE ONE TABLET BY MOUTH ONCE DAILY 30 tablet 11  . glucosamine-chondroitin 500-400 MG tablet Take 1 tablet by mouth 2 (two) times daily.    Marland Kitchen. losartan (COZAAR) 50 MG tablet Take 1 tablet (50 mg total) by mouth daily. 90 tablet 3  . Multiple Vitamins-Minerals (MULTIVITAMIN WITH MINERALS) tablet Take 1 tablet by mouth daily.    . ranitidine (ZANTAC) 150 MG tablet Take 150 mg by mouth daily at 8 pm.    . VENTOLIN HFA 108 (90 Base) MCG/ACT inhaler INAHLE 1 TO 2 PUFFS INTOS THE LUNGS EVERY 6 HOURS AS NEEDED FOR WHEEZING OR SHORTNESS OF BREATH 18 g 2  . zafirlukast (ACCOLATE) 20 MG tablet TAKE ONE TABLET BY MOUTH TWICE DAILY 180 tablet 3   No current facility-administered medications on file prior to visit.     LABS/IMAGING: No results found for this or any previous visit (from the past 48 hour(s)). No results found.  LIPID PANEL:    Component Value Date/Time   CHOL 146 02/19/2016 0918   TRIG 102 02/19/2016 0918   HDL 44 02/19/2016 0918   CHOLHDL  3.3 02/19/2016 0918   VLDL 20 02/19/2016 0918   LDLCALC 82 02/19/2016 0918    WEIGHTS: Wt Readings from Last 3 Encounters:  08/26/16 (!) 335 lb 6.4 oz (152.1 kg)  07/22/16 (!) 336 lb 12.8 oz (152.8 kg)  04/11/16 (!) 334 lb 9.6 oz (151.8 kg)    VITALS: BP (!) 169/89   Pulse 76   Ht 5\' 9"  (1.753 m)   Wt (!) 335 lb 6.4 oz (152.1 kg)   BMI 49.53 kg/m   EXAM: General appearance: alert, no distress and morbidly obese Neck: no carotid bruit and no JVD Lungs: clear to auscultation bilaterally Heart: regular rate and rhythm, S1, S2 normal, no murmur, click, rub or gallop Abdomen: soft, non-tender; bowel sounds normal; no masses,  no organomegaly and Morbidly obese Extremities:  edema 1+ bilateral pitting edema, no varicosities Pulses: 2+ and symmetric Skin: Pale, warm, dry Neurologic: Mental status: Alert, oriented, thought content appropriate Psych: Pleasant  EKG: Normal sinus rhythm at 76, RBBB - personally reviewed  ASSESSMENT: 1. Progressive dyspnea on exertion and fatigue 2. Morbid obesity 3. RBBB 4. Hypertension 5. Dyslipidemia 6. Asthma 7. OSA on CPAP  PLAN: 1.   Mr. William Pian has had dyspnea on exertion and fatigue which is somewhat progressive although his activity level has waned some. 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 Weight is been fairly stable. He does have a right bundle branch block on EKG, possibly from chronic lung disease or upper airway resistance syndrome. He reports improvement with his Breo Ellipta inhaler. He is concerned about his family history of coronary disease and was given multiple coronary risk factors he is at high risk for heart disease. Given his right bundle branch block, dyspnea and lower extremity swelling I like to get an echocardiogram to rule out any systolic dysfunction particularly any right heart failure. In addition will schedule for a nuclear stress test. He said he would like to try to exercise on the treadmill. This may give [place] some insight into his exercise capacity. Ultimately I think that his weight is the major contributor to his shortness of breath due to VO2 limitations.  Thanks for the consultation. I will plan to see him back to discuss the results in a few weeks.  Chrystie Nose, MD, [facility]  [place]  [facility]  Attending Cardiologist  Direct Dial: [phone]  Fax: [phone]  Website:  [email or web address]..Blenda Nicely Terryn Rosenkranz 08/26/2016, 10:14 AM

## 2016-09-02 ENCOUNTER — Other Ambulatory Visit: Payer: Medicare Other

## 2016-09-02 ENCOUNTER — Encounter: Payer: Self-pay | Admitting: Adult Health

## 2016-09-02 ENCOUNTER — Ambulatory Visit (INDEPENDENT_AMBULATORY_CARE_PROVIDER_SITE_OTHER): Payer: Medicare Other | Admitting: Adult Health

## 2016-09-02 VITALS — BP 128/64 | HR 77 | Ht 69.0 in | Wt 332.2 lb

## 2016-09-02 DIAGNOSIS — R0602 Shortness of breath: Secondary | ICD-10-CM

## 2016-09-02 DIAGNOSIS — J4540 Moderate persistent asthma, uncomplicated: Secondary | ICD-10-CM

## 2016-09-02 MED ORDER — BUDESONIDE-FORMOTEROL FUMARATE 160-4.5 MCG/ACT IN AERO: 2.0000 | INHALATION_SPRAY | Freq: Two times a day (BID) | RESPIRATORY_TRACT | 0 refills | Status: DC

## 2016-09-02 MED ORDER — BUDESONIDE-FORMOTEROL FUMARATE 160-4.5 MCG/ACT IN AERO: 2.0000 | INHALATION_SPRAY | Freq: Two times a day (BID) | RESPIRATORY_TRACT | 5 refills | Status: DC

## 2016-09-02 NOTE — Progress Notes (Signed)
@Patient  ID: William Howell, male    DOB: 1940/09/01, 76 y.o.   MRN: [id number]  Chief Complaint  Patient presents with  . Follow-up    Asthma     Referring provider: [name], MD  HPI: 76 yo male never smoker seen for pulmonary consult 11/2015 for dyspnea and dry cough  William Howell (worked in Therapist, nutritional, dust, some fertilizer . NO chickens, pigeons)  Has OSA on BIPAP   TEST  September 2017 lung function testing ratio 80%, FEV1 2.28 L 77% predicted, FVC 2.86 L 70% predicted, total lung capacity 5.07 L 74% predicted, DLCO 26.9 286% predicted August 2017 proBNP normal August 2017 chemistry with slightly elevated bicarbonate, hemoglobin 15.0 High resolution CT chest October 2017> groundglass attenuation. No ILD.  09/02/2016 Follow up ; Asthma , OSA , Dyspnea  Pt returns for 1 month follow up . He was seen last visit with persistent dyspnea with activity .  Says over last 5-81yr DOE has been getting progressively worse . Gets winded now with minimal walking. 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  Last visit he had a chest xray and cbc that were unrevealing.  He was referred to cardiology . He has been set up for a echo and stress test in 2 weeks.  We discussed if normal could consider pulmonary rehab.   He remains on BREO but says it causes him to be hoarse. . No increased cough or wheezing .   Remains on BIPAP At bedtime  . Doing well and feels rested.  Download last month per note was under good control with good compliance.     No Known Allergies  Immunization History  Administered Date(s) Administered  . Influenza,inj,Quad PF,36+ Mos 02/10/2014, 03/14/2015, 01/09/2016  . Pneumococcal Conjugate-13 07/04/2014  . Pneumococcal Polysaccharide-23 12/26/2011  . Td 12/26/2011  . Tdap 12/26/2011  . Zoster 02/10/2014    Past Medical History:  Diagnosis Date  . Asthma   . Diabetes mellitus type II, controlled ([place]   . Hypertension   .  Obesity   . Sleep apnea    uses CPAP    Tobacco History: History  Smoking Status  . Never Smoker  Smokeless Tobacco  . Current User  . Types: Chew   Ready to quit: Not Answered Counseling given: Not Answered   Outpatient Encounter Prescriptions as of 09/02/2016  Medication Sig  . aspirin EC 81 MG tablet Take 81 mg by mouth daily.  . Calcium-Vitamin D-Vitamin K 500-100-40 MG-UNT-MCG CHEW Chew by mouth 2 (two) times daily.  . cetirizine (ZYRTEC) 10 MG tablet TAKE ONE TABLET BY MOUTH ONCE DAILY  . glucosamine-chondroitin 500-400 MG tablet Take 1 tablet by mouth 2 (two) times daily.  .Marland Kitchenlosartan (COZAAR) 50 MG tablet Take 1 tablet (50 mg total) by mouth daily.  . Multiple Vitamins-Minerals (MULTIVITAMIN WITH MINERALS) tablet Take 1 tablet by mouth daily.  . ranitidine (ZANTAC) 150 MG tablet Take 150 mg by mouth daily at 8 pm.  . VENTOLIN HFA 108 (90 Base) MCG/ACT inhaler INAHLE 1 TO 2 PUFFS INTOS THE LUNGS EVERY 6 HOURS AS NEEDED FOR WHEEZING OR SHORTNESS OF BREATH  . zafirlukast (ACCOLATE) 20 MG tablet TAKE ONE TABLET BY MOUTH TWICE DAILY  . budesonide-formoterol (SYMBICORT) 160-4.5 MCG/ACT inhaler Inhale 2 puffs into the lungs 2 (two) times daily.  . budesonide-formoterol (SYMBICORT) 160-4.5 MCG/ACT inhaler Inhale 2 puffs into the lungs 2 (two) times daily.  . [DISCONTINUED] BREO ELLIPTA 200-25 MCG/INH  AEPB INHALE ONE PUFF BY MOUTH ONCE DAILY (Patient not taking: Reported on 09/02/2016)   No facility-administered encounter medications on file as of 09/02/2016.      Review of Systems  Constitutional:   No  weight loss, night sweats,  Fevers, chills,  +fatigue, or  lassitude.  HEENT:   No headaches,  Difficulty swallowing,  Tooth/dental problems, or  Sore throat,                No sneezing, itching, ear ache,  +nasal congestion, post nasal drip,   CV:  No chest pain,  Orthopnea, PND, swelling in lower extremities, anasarca, dizziness, palpitations, syncope.   GI  No heartburn,  indigestion, abdominal pain, nausea, vomiting, diarrhea, change in bowel habits, loss of appetite, bloody stools.   Resp:    No chest wall deformity  Skin: no rash or lesions.  GU: no dysuria, change in color of urine, no urgency or frequency.  No flank pain, no hematuria   MS:  No joint pain or swelling.  No decreased range of motion.  No back pain.    Physical Exam  BP 128/64 (BP Location: Left Arm, Cuff Size: Normal)   Pulse 77   Ht 5' 9"  (1.753 m)   Wt (!) 332 lb 3.2 oz (150.7 kg)   SpO2 96%   BMI 49.06 kg/m   GEN: A/Ox3; pleasant , NAD, morbidly obese    HEENT:  William Howell/AT,  EACs-clear, TMs-wnl, NOSE-clear, THROAT-clear, no lesions, no postnasal drip or exudate noted.   NECK:  Supple w/ fair ROM; no JVD; normal carotid impulses w/o bruits; no thyromegaly or nodules palpated; no lymphadenopathy.    RESP  Decreased BS in bases , no wheezing . . no accessory muscle use, no dullness to percussion  CARD:  RRR, no m/r/g, no peripheral edema, pulses intact, no cyanosis or clubbing.  GI:   Soft & nt; nml bowel sounds; no organomegaly or masses detected.   Musco: Warm bil, no deformities or joint swelling noted.   Neuro: alert, no focal deficits noted.    Skin: Warm, no lesions or rashes    Lab Results:  CBC    Component Value Date/Time   WBC 10.6 (H) 07/22/2016 1011   RBC 4.78 07/22/2016 1011   HGB 14.4 07/22/2016 1011   HCT 43.6 07/22/2016 1011   PLT 307.0 07/22/2016 1011   MCV 91.2 07/22/2016 1011   MCH 30.1 06/30/2014 0824   MCHC 33.0 07/22/2016 1011   RDW 13.7 07/22/2016 1011   LYMPHSABS 2.6 07/22/2016 1011   MONOABS 1.1 (H) 07/22/2016 1011   EOSABS 0.2 07/22/2016 1011   BASOSABS 0.1 07/22/2016 1011    BMET    Component Value Date/Time   NA 140 02/19/2016 0918   K 4.5 02/19/2016 0918   CL 102 02/19/2016 0918   CO2 27 02/19/2016 0918   GLUCOSE 137 (H) 02/19/2016 0918   BUN 18 02/19/2016 0918   CREATININE 1.26 (H) 02/19/2016 0918   CALCIUM 9.7  02/19/2016 0918   GFRNONAA 55 (L) 02/19/2016 0918   GFRAA 64 02/19/2016 0918    BNP No results found for: BNP  ProBNP    Component Value Date/Time   PROBNP 41.0 12/12/2015 1138    Imaging: No results found.   Assessment & Plan:   Asthma, chronic Chronic asthma with suspected medication intolerance Change to Symbicort , inhaler oral care.    Dyspnea Progressive DOE ? Etiology suspect is multifactoral with Asthma, obesity and deconditioning  Cardiac workup  with stress test and echo are pending  Will change to Symbicort  Workup thus far unrevealing , labs, allergy profile, CT Chest showed some mosiac attenuation . ? Significance. No ILD.  ESR was low.  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  Doubt as has minimal cough  Cont to follow if stress test neg , consider pulmonary rehab     Rexene Edison, NP 09/02/2016

## 2016-09-02 NOTE — Progress Notes (Signed)
Reviewed, agree 

## 2016-09-02 NOTE — Patient Instructions (Signed)
Labs today .  Change BREO to Symbicort 160/4.485mcg 2 puffs Twice daily  . ,brush, rinse and gargle after use.  Once you have your stress test done, call if you would like referral to pulmonary rehab.  Follow up Dr. Kendrick FriesMcQuaid in 2 months and As needed

## 2016-09-02 NOTE — Assessment & Plan Note (Signed)
Progressive DOE ? Etiology suspect is multifactoral with Asthma, obesity and deconditioning  Cardiac workup with stress test and echo are pending  Will change to Symbicort  Workup thus far unrevealing , labs, allergy profile, CT Chest showed some mosiac attenuation . ? Significance. No ILD.  ESR was low.  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  Doubt as has minimal cough  Cont to follow if stress test neg , consider pulmonary rehab

## 2016-09-02 NOTE — Assessment & Plan Note (Signed)
Chronic asthma with suspected medication intolerance Change to Symbicort , inhaler oral care.

## 2016-09-02 NOTE — Progress Notes (Signed)
Patient seen in the office today and instructed on use of Symbicort 160.  Patient expressed understanding and demonstrated technique. Boone MasterJessica Jones, CMA 09/02/16

## 2016-09-06 LAB — HYPERSENSITIVITY PNEUMONITIS
A. Pullulans Abs: NEGATIVE
A.Fumigatus #1 Abs: NEGATIVE
Micropolyspora faeni, IgG: NEGATIVE
Pigeon Serum Abs: NEGATIVE
Thermoact. Saccharii: NEGATIVE
Thermoactinomyces vulgaris, IgG: NEGATIVE

## 2016-09-11 ENCOUNTER — Telehealth (HOSPITAL_COMMUNITY): Payer: Self-pay | Admitting: *Deleted

## 2016-09-11 NOTE — Telephone Encounter (Signed)
Spoke with patient's wife per DPR.Patient given detailed instructions per Myocardial Perfusion Study Information Sheet for the test on 09/16/16 at 1000. Patient notified to arrive 15 minutes early and that it is imperative to arrive on time for appointment to keep from having the test rescheduled.  If you need to cancel or reschedule your appointment, please call the office within 24 hours of your appointment. . Patient verbalized understanding.Anisha Starliper, Adelene IdlerCynthia W

## 2016-09-16 ENCOUNTER — Ambulatory Visit (HOSPITAL_COMMUNITY): Payer: Medicare Other

## 2016-09-16 ENCOUNTER — Other Ambulatory Visit (HOSPITAL_COMMUNITY): Payer: Medicare Other

## 2016-09-16 ENCOUNTER — Ambulatory Visit (HOSPITAL_BASED_OUTPATIENT_CLINIC_OR_DEPARTMENT_OTHER): Payer: Medicare Other

## 2016-09-16 ENCOUNTER — Other Ambulatory Visit: Payer: Self-pay

## 2016-09-16 ENCOUNTER — Ambulatory Visit (HOSPITAL_COMMUNITY): Payer: Medicare Other | Attending: Cardiovascular Disease

## 2016-09-16 DIAGNOSIS — R0602 Shortness of breath: Secondary | ICD-10-CM

## 2016-09-16 DIAGNOSIS — G4731 Primary central sleep apnea: Secondary | ICD-10-CM | POA: Diagnosis not present

## 2016-09-16 DIAGNOSIS — G4730 Sleep apnea, unspecified: Secondary | ICD-10-CM | POA: Insufficient documentation

## 2016-09-16 DIAGNOSIS — I10 Essential (primary) hypertension: Secondary | ICD-10-CM | POA: Insufficient documentation

## 2016-09-16 DIAGNOSIS — I082 Rheumatic disorders of both aortic and tricuspid valves: Secondary | ICD-10-CM | POA: Diagnosis not present

## 2016-09-16 DIAGNOSIS — R5383 Other fatigue: Secondary | ICD-10-CM | POA: Diagnosis not present

## 2016-09-16 DIAGNOSIS — R9439 Abnormal result of other cardiovascular function study: Secondary | ICD-10-CM | POA: Insufficient documentation

## 2016-09-16 DIAGNOSIS — I4510 Unspecified right bundle-branch block: Secondary | ICD-10-CM

## 2016-09-16 DIAGNOSIS — Z6841 Body Mass Index (BMI) 40.0 and over, adult: Secondary | ICD-10-CM | POA: Insufficient documentation

## 2016-09-16 DIAGNOSIS — R079 Chest pain, unspecified: Secondary | ICD-10-CM | POA: Insufficient documentation

## 2016-09-16 LAB — ECHOCARDIOGRAM COMPLETE
Height: 69 in
Weight: 5360 oz

## 2016-09-16 MED ORDER — REGADENOSON 0.4 MG/5ML IV SOLN
0.4000 mg | Freq: Once | INTRAVENOUS | Status: AC
  Administered 2016-09-16: 0.4 mg via INTRAVENOUS

## 2016-09-16 MED ORDER — PERFLUTREN LIPID MICROSPHERE
1.0000 mL | INTRAVENOUS | Status: AC | PRN
  Administered 2016-09-16: 2 mL via INTRAVENOUS

## 2016-09-16 MED ORDER — TECHNETIUM TC 99M TETROFOSMIN IV KIT
33.0000 | PACK | Freq: Once | INTRAVENOUS | Status: AC | PRN
  Administered 2016-09-16: 33 via INTRAVENOUS
  Filled 2016-09-16: qty 33

## 2016-09-17 ENCOUNTER — Ambulatory Visit (HOSPITAL_COMMUNITY): Payer: Medicare Other | Attending: Cardiovascular Disease

## 2016-09-17 ENCOUNTER — Ambulatory Visit (HOSPITAL_COMMUNITY): Payer: Medicare Other

## 2016-09-17 LAB — MYOCARDIAL PERFUSION IMAGING
LV dias vol: 114 mL (ref 62–150)
LV sys vol: 47 mL
Peak HR: 90 {beats}/min
RATE: 0.39
Rest HR: 76 {beats}/min
SDS: 9
SRS: 4
SSS: 13
TID: 0.88

## 2016-09-17 MED ORDER — TECHNETIUM TC 99M TETROFOSMIN IV KIT
32.6000 | PACK | Freq: Once | INTRAVENOUS | Status: AC | PRN
  Administered 2016-09-17: 32.6 via INTRAVENOUS
  Filled 2016-09-17: qty 33

## 2016-09-25 DIAGNOSIS — G4730 Sleep apnea, unspecified: Secondary | ICD-10-CM | POA: Diagnosis not present

## 2016-09-25 DIAGNOSIS — G4731 Primary central sleep apnea: Secondary | ICD-10-CM | POA: Diagnosis not present

## 2016-10-07 ENCOUNTER — Encounter: Payer: Self-pay | Admitting: Internal Medicine

## 2016-10-07 ENCOUNTER — Ambulatory Visit (INDEPENDENT_AMBULATORY_CARE_PROVIDER_SITE_OTHER): Payer: Medicare Other | Admitting: Internal Medicine

## 2016-10-07 VITALS — BP 159/93 | HR 88 | Ht 69.0 in | Wt 331.4 lb

## 2016-10-07 DIAGNOSIS — I4510 Unspecified right bundle-branch block: Secondary | ICD-10-CM

## 2016-10-07 DIAGNOSIS — R0602 Shortness of breath: Secondary | ICD-10-CM | POA: Diagnosis not present

## 2016-10-07 NOTE — Patient Instructions (Signed)
Your physician wants you to follow-up in: 6 months with Dr. Hilty. You will receive a reminder letter in the mail two months in advance. If you don't receive a letter, please call our office to schedule the follow-up appointment.    

## 2016-10-07 NOTE — Progress Notes (Signed)
OFFICE CONSULT NOTE  Chief Complaint:  Follow-up studies  Primary Care Physician: Donita BrooksPickard, Warren T, MD  HPI:  William Howell is a 76 y.o. male who is being seen today for the evaluation of DOE and fatigue at the request of Dr. Kendrick FriesMcQuaid. William Howell has a history of morbid obesity, type 2 diabetes, hypertension, asthma and obstructive sleep apnea on CPAP. He reports at least 3 or 4 years of significant dyspnea on exertion. His weight is been fairly stable although at one time in the past he lost more than 50 pounds on a commercial weight loss diet but regained the weight. Recently he's been working with his pulmonologist for shortness of breath. He says in general is improved however he is concerned about possible underlying coronary disease. Certainly has multiple coronary risk factors and a family history of heart attack in his father who also had pulmonary disease and it may been related to using his inhaler. He also has a sister who died of heart attack. William Howell feels short of breath when exerting himself, particularly walking up stairs or doing some physical activities. He denies any chest pain, pressure, heaviness or associated symptoms.  10/07/2016  William Howell returns today for follow-up. He underwent a nuclear stress test which was negative for ischemia but showed a fixed inferior defect. This could potentially be scar however suspect that likely bowel attenuation artifact given his obesity. The echocardiogram showed normal inferior wall motion in that territory without any evidence of scar. Echo did also showed normal systolic function, mild LVH, normal diastolic dysfunction and mild aortic insufficiency. I don't believe the degree of aortic insufficiency that he has would explain his shortness of breath. Rather think his symptoms are multifactorial including obesity, asthma and perhaps upper airway resistance / poor diaphragmatic excursion.  PMHx:  Past Medical History:    Diagnosis Date  . Asthma   . Diabetes mellitus type II, controlled (HCC)   . Hypertension   . Obesity   . Sleep apnea    uses CPAP    Past Surgical History:  Procedure Laterality Date  . CHOLECYSTECTOMY      FAMHx:  Family History  Problem Relation Age of Onset  . Heart disease Father   . Heart attack Father   . Heart attack Sister     SOCHx:   reports that he has never smoked. His smokeless tobacco use includes Chew. He reports that he does not drink alcohol or use drugs.  ALLERGIES:  No Known Allergies  ROS: Pertinent items noted in HPI and remainder of comprehensive ROS otherwise negative.  HOME MEDS: Current Outpatient Prescriptions on File Prior to Visit  Medication Sig Dispense Refill  . aspirin EC 81 MG tablet Take 81 mg by mouth daily.    . budesonide-formoterol (SYMBICORT) 160-4.5 MCG/ACT inhaler Inhale 2 puffs into the lungs 2 (two) times daily. 1 Inhaler 5  . budesonide-formoterol (SYMBICORT) 160-4.5 MCG/ACT inhaler Inhale 2 puffs into the lungs 2 (two) times daily. 1 Inhaler 0  . Calcium-Vitamin D-Vitamin K 500-100-40 MG-UNT-MCG CHEW Chew by mouth 2 (two) times daily.    . cetirizine (ZYRTEC) 10 MG tablet TAKE ONE TABLET BY MOUTH ONCE DAILY 30 tablet 11  . glucosamine-chondroitin 500-400 MG tablet Take 1 tablet by mouth 2 (two) times daily.    Marland Kitchen. losartan (COZAAR) 50 MG tablet Take 1 tablet (50 mg total) by mouth daily. 90 tablet 3  . Multiple Vitamins-Minerals (MULTIVITAMIN WITH MINERALS) tablet Take 1 tablet by mouth daily.    .Marland Kitchen  ranitidine (ZANTAC) 150 MG tablet Take 150 mg by mouth daily at 8 pm.    . VENTOLIN HFA 108 (90 Base) MCG/ACT inhaler INAHLE 1 TO 2 PUFFS INTOS THE LUNGS EVERY 6 HOURS AS NEEDED FOR WHEEZING OR SHORTNESS OF BREATH 18 g 2  . zafirlukast (ACCOLATE) 20 MG tablet TAKE ONE TABLET BY MOUTH TWICE DAILY 180 tablet 3   No current facility-administered medications on file prior to visit.     LABS/IMAGING: No results found for this or any  previous visit (from the past 48 hour(s)). No results found.  LIPID PANEL:    Component Value Date/Time   CHOL 146 02/19/2016 0918   TRIG 102 02/19/2016 0918   HDL 44 02/19/2016 0918   CHOLHDL 3.3 02/19/2016 0918   VLDL 20 02/19/2016 0918   LDLCALC 82 02/19/2016 0918    WEIGHTS: Wt Readings from Last 3 Encounters:  10/07/16 (!) 331 lb 6.4 oz (150.3 kg)  09/16/16 (!) 335 lb (152 kg)  09/02/16 (!) 332 lb 3.2 oz (150.7 kg)    VITALS: BP (!) 159/93   Pulse 88   Ht 5\' 9"  (1.753 m)   Wt (!) 331 lb 6.4 oz (150.3 kg)   BMI 48.94 kg/m   EXAM: Deferred  EKG: Deferred  ASSESSMENT: 1. Progressive dyspnea on exertion and fatigue - low risk stress test, fixed inferior attenuation artifact (09/2016) 2. LVEF 60-65%, mild AI, mild LVH (09/2016) 3. Morbid obesity 4. RBBB 5. Hypertension 6. Dyslipidemia 7. Asthma 8. OSA on CPAP  PLAN: 1.   William Howell has dyspnea which is likely multifactorial however I don't believe it is related to significant obstructive coronary disease, cardiomyopathy or is mild aortic insufficiency. I've encouraged him to continue to work on exercise, weight loss and dietary modifications. He continues to work with Dr. Kendrick Fries in pulmonary and currently is on Symbicort. He can follow-up with me in 6 months to see if he's made significant progress with his shortness of breath which he says is mildly improved since we last saw him.  William Nose, MD, [facility]  [place]  [facility]  Attending Cardiologist  Direct Dial: [phone]  Fax: [phone]  Website:  [email or web address] 10/07/2016, 9:02 AM

## 2016-10-15 DIAGNOSIS — N3001 Acute cystitis with hematuria: Secondary | ICD-10-CM | POA: Diagnosis not present

## 2016-10-15 DIAGNOSIS — R350 Frequency of micturition: Secondary | ICD-10-CM | POA: Diagnosis not present

## 2016-11-11 ENCOUNTER — Ambulatory Visit: Payer: Medicare Other | Admitting: Pulmonary Disease

## 2016-11-13 ENCOUNTER — Ambulatory Visit: Payer: Medicare Other | Admitting: Pulmonary Disease

## 2016-11-13 NOTE — Progress Notes (Deleted)
   Subjective:    Patient ID: William Howell, male    DOB: Jul 31, 1940, 76 y.o.   MRN: [id number]  Synopsis: Referred in August 2017 for evaluation of shortness of breath related to asthma.  He has asthma and obstructive sleep apnea.    HPI No chief complaint on file.  ***   Past Medical History:  Diagnosis Date  . Asthma   . Diabetes mellitus type II, controlled (HCC)   . Hypertension   . Obesity   . Sleep apnea    uses CPAP      Review of Systems  Constitutional: Negative for chills, fatigue and fever.  HENT: Negative for postnasal drip, rhinorrhea, sinus pain and sinus pressure.   Respiratory: Negative for cough, shortness of breath and wheezing.   Cardiovascular: Negative for chest pain, palpitations and leg swelling.       Objective:   Physical Exam There were no vitals filed for this visit. RA  ***  Echo: 09/2016 TTE: LVEF 60-65%, mild LVH, mild AR, RVSP normal   PFT: September 2017 lung function testing ratio 80%, FEV1 2.28 L 77% predicted, FVC 2.86 L 70% predicted, total lung capacity 5.07 L 74% predicted, DLCO 26.92 86% predicted  Labs: August 2017 proBNP normal August 2017 chemistry with slightly elevated bicarbonate, hemoglobin 15.0   Imaging: October 2017 high-resolution CT chest images personally reviewed showing air-trapping consistent with small airways disease, some mild scattered patchy groundglass but nothing to suggest underlying interstitial lung disease. 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    Assessment & Plan:  No diagnosis found.  Discussion: ***    Current Outpatient Prescriptions:  .  aspirin EC 81 MG tablet, Take 81 mg by mouth daily., Disp: , Rfl:  .  budesonide-formoterol (SYMBICORT) 160-4.5 MCG/ACT inhaler, Inhale 2 puffs into the lungs 2 (two) times daily., Disp: 1 Inhaler, Rfl: 5 .  budesonide-formoterol (SYMBICORT) 160-4.5 MCG/ACT inhaler, Inhale 2 puffs into the lungs 2 (two) times daily., Disp: 1  Inhaler, Rfl: 0 .  Calcium-Vitamin D-Vitamin K 500-100-40 MG-UNT-MCG CHEW, Chew by mouth 2 (two) times daily., Disp: , Rfl:  .  cetirizine (ZYRTEC) 10 MG tablet, TAKE ONE TABLET BY MOUTH ONCE DAILY, Disp: 30 tablet, Rfl: 11 .  glucosamine-chondroitin 500-400 MG tablet, Take 1 tablet by mouth 2 (two) times daily., Disp: , Rfl:  .  losartan (COZAAR) 50 MG tablet, Take 1 tablet (50 mg total) by mouth daily., Disp: 90 tablet, Rfl: 3 .  Multiple Vitamins-Minerals (MULTIVITAMIN WITH MINERALS) tablet, Take 1 tablet by mouth daily., Disp: , Rfl:  .  ranitidine (ZANTAC) 150 MG tablet, Take 150 mg by mouth daily at 8 pm., Disp: , Rfl:  .  VENTOLIN HFA 108 (90 Base) MCG/ACT inhaler, INAHLE 1 TO 2 PUFFS INTOS THE LUNGS EVERY 6 HOURS AS NEEDED FOR WHEEZING OR SHORTNESS OF BREATH, Disp: 18 g, Rfl: 2 .  zafirlukast (ACCOLATE) 20 MG tablet, TAKE ONE TABLET BY MOUTH TWICE DAILY, Disp: 180 tablet, Rfl: 3

## 2016-11-30 ENCOUNTER — Other Ambulatory Visit: Payer: Self-pay | Admitting: Family Medicine

## 2017-02-17 ENCOUNTER — Encounter: Payer: Self-pay | Admitting: Pulmonary Disease

## 2017-02-17 ENCOUNTER — Ambulatory Visit: Payer: Medicare Other | Admitting: Pulmonary Disease

## 2017-02-17 VITALS — BP 130/90 | HR 90 | Ht 69.0 in | Wt 318.0 lb

## 2017-02-17 DIAGNOSIS — R0600 Dyspnea, unspecified: Secondary | ICD-10-CM | POA: Diagnosis not present

## 2017-02-17 DIAGNOSIS — Z23 Encounter for immunization: Secondary | ICD-10-CM | POA: Diagnosis not present

## 2017-02-17 DIAGNOSIS — J4540 Moderate persistent asthma, uncomplicated: Secondary | ICD-10-CM | POA: Diagnosis not present

## 2017-02-17 DIAGNOSIS — M25561 Pain in right knee: Secondary | ICD-10-CM | POA: Diagnosis not present

## 2017-02-17 NOTE — Progress Notes (Signed)
Subjective:    Patient ID: William Howell, male    DOB: [birth year], 76 y.o.   MRN: [id number]  Synopsis: Referred in August 2017 for evaluation of shortness of breath related to asthma.  He has asthma and obstructive sleep apnea.    HPI Chief Complaint  Patient presents with  . 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  He has a little dyspnea from time to time.  No cough right now.  He is taking Symbicort without difficulty.    He is not exercising because his R knee has been a problem for the last 6 weeks.  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      Past Medical History:  Diagnosis Date  . Asthma   . Diabetes mellitus type II, controlled (HCC)   . Hypertension   . Obesity   . Sleep apnea    uses CPAP      Review of Systems  Constitutional: Negative for chills, fatigue and fever.  HENT: Negative for postnasal drip, rhinorrhea, sinus pressure and sinus pain.   Respiratory: Negative for cough, shortness of breath and wheezing.   Cardiovascular: Negative for chest pain, palpitations and leg swelling.       Objective:   Physical Exam Vitals:   02/17/17 0912  BP: 130/90  Pulse: 90  SpO2: 97%  Weight: (!) 318 lb (144.2 kg)  Height: 5\' 9"  (1.753 m)   RA  Gen: well appearing HENT: OP clear, TM's clear, neck supple PULM: CTA B, normal percussion CV: RRR, no mgr, trace edema GI: BS+, soft, nontender Derm: no cyanosis or rash Psyche: normal mood and affect    Records from his visit with us in May reviewed, inhalers were changed to Symbicort.  Records from his December 2017 visit with pulmonary interstitial practitioner reviewed were he had bronchitis and was treated with prednisone and Augmentin.  PFT: September 2017 lung function testing ratio 80%, FEV1 2.28 L 77% predicted, FVC 2.86 L 70% predicted, total lung capacity 5.07  L 74% predicted, DLCO 26.92 86% predicted  Labs: August 2017 proBNP normal August 2017 chemistry with slightly elevated bicarbonate, hemoglobin 15.0   Imaging: October 2017 high-resolution CT chest images personally reviewed showing air-trapping consistent with small airways disease, some mild scattered patchy groundglass but nothing to suggest underlying interstitial lung disease. 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  CBC    Component Value Date/Time   WBC 10.6 (H) 07/22/2016 1011   RBC 4.78 07/22/2016 1011   HGB 14.4 07/22/2016 1011   HCT 43.6 07/22/2016 1011   PLT 307.0 07/22/2016 1011   MCV 91.2 07/22/2016 1011   MCH 30.1 06/30/2014 0824   MCHC 33.0 07/22/2016 1011   RDW 13.7 07/22/2016 1011   LYMPHSABS 2.6 07/22/2016 1011   MONOABS 1.1 (H) 07/22/2016 1011   EOSABS 0.2 07/22/2016 1011   BASOSABS 0.1 07/22/2016 1011       Assessment & Plan:   Moderate persistent chronic asthma without complication  Dyspnea, unspecified type  Recurrent pain of right knee  Discussion: From an asthma standpoint he has been doing much better on Symbicort.  He needs a flu shot today.  We counseled him on the importance of good hand hygiene this time a year to avoid infections.  He has a new problem of right knee pain which occurred after  a twisting movement.  I fear he may have a meniscus tear.  Probably needs to see sports medicine for this.  I advised him today that the more he exercises the better it is for his asthma so we really need to get this cleared up.  Plan: For your asthma: Continue Symbicort 2 puffs twice a day no matter how you feel Flu shot today Practice good hand hygiene in the wintertime  For your knee pain, likely meniscus tear I recommend that she see a sports medicine physician like Dr. Ayesha Mohair, Dr. Darrick Penna, Dr. Burnis Kingfisher, or Dr. Lendon Colonel.  I will see you back in 6 months or sooner if needed    Current Outpatient Medications:  .   Albuterol Sulfate 108 (90 Base) MCG/ACT AEPB, Inhale 1-2 puffs every 6 (six) hours as needed into the lungs., Disp: , Rfl:  .  aspirin EC 81 MG tablet, Take 81 mg daily by mouth., Disp: , Rfl:  .  budesonide-formoterol (SYMBICORT) 160-4.5 MCG/ACT inhaler, Inhale 2 puffs 2 (two) times daily into the lungs., Disp: , Rfl:  .  cetirizine (ZYRTEC) 10 MG tablet, Take 10 mg daily by mouth., Disp: , Rfl:  .  glucosamine-chondroitin 500-400 MG tablet, Take 1 tablet 2 (two) times daily by mouth., Disp: , Rfl:  .  losartan (COZAAR) 50 MG tablet, Take 50 mg daily by mouth., Disp: , Rfl:  .  Multiple Vitamins-Minerals (MULTIVITAMIN WITH MINERALS) tablet, Take 1 tablet daily by mouth., Disp: , Rfl:  .  ranitidine (ZANTAC) 150 MG tablet, Take 150 mg daily by mouth., Disp: , Rfl:  .  zafirlukast (ACCOLATE) 20 MG tablet, Take 20 mg 2 (two) times daily before a meal by mouth., Disp: , Rfl:

## 2017-02-17 NOTE — Patient Instructions (Signed)
For your asthma: Continue Symbicort 2 puffs twice a day no matter how you feel Flu shot today Practice good hand hygiene in the wintertime  For your knee pain, likely meniscus tear I recommend that she see a sports medicine physician like Dr. Ayesha MohairZack Smith, Dr. Darrick PennaFields, Dr. Burnis KingfisherStephen Rigby, or Dr. Lendon ColonelBecky Bassett.  I will see you back in 6 months or sooner if needed

## 2017-02-17 NOTE — Addendum Note (Signed)
Addended by: Max FickleMCQUAID, Kameshia Madruga B on: 02/17/2017 10:02 AM   Modules accepted: Level of Service

## 2017-03-02 DIAGNOSIS — J209 Acute bronchitis, unspecified: Secondary | ICD-10-CM | POA: Diagnosis not present

## 2017-03-02 DIAGNOSIS — J45901 Unspecified asthma with (acute) exacerbation: Secondary | ICD-10-CM | POA: Diagnosis not present

## 2017-03-18 ENCOUNTER — Ambulatory Visit (INDEPENDENT_AMBULATORY_CARE_PROVIDER_SITE_OTHER): Payer: Medicare Other | Admitting: Sports Medicine

## 2017-03-18 ENCOUNTER — Encounter: Payer: Self-pay | Admitting: Sports Medicine

## 2017-03-18 DIAGNOSIS — G8929 Other chronic pain: Secondary | ICD-10-CM

## 2017-03-18 DIAGNOSIS — M25561 Pain in right knee: Secondary | ICD-10-CM | POA: Diagnosis not present

## 2017-03-18 NOTE — Assessment & Plan Note (Signed)
See OV   I think compression and relative rest for a few more weeks and this will likely return to normal  Seems ligamentous related to fibular head stability

## 2017-03-18 NOTE — Progress Notes (Signed)
   Subjective:    Patient ID: William Howell, male    DOB: 1940-07-28, 76 y.o.   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 About 3 months ago he was lifting supplies into his pickup truck when he twisted his body with a fixed leg. He heard a pop, his right knee began to swell and it was a sharp 10/10 pain that almost brought him to tears. He was able to bear weight on that side after the injury but it caused him significant pain. For the past 3 months he has been using Tylenol, Advil and a knee brace he bought at [employer] which has helped him. The swelling will come and go in the knee as will the pain which is now a dull 4-5/10. This is associated with increased usage, but it will sometimes wake him up from sleep. The pain is located on both sides of the knee, but especially the lateral side. Sometimes the pain will radiate behind the knee and up and down the leg a little bit. He has stopped using his brace for the past week and has been able to walk much better than before.      Review of Systems  Constitutional: Negative.   HENT: Negative.   Eyes: Negative.   Respiratory: Negative.   Cardiovascular: Negative.   Gastrointestinal: Negative.   Endocrine: Negative.   Genitourinary: Negative.   Musculoskeletal: Positive for arthralgias (Right knee pain), gait problem and joint swelling.  Allergic/Immunologic: Negative.   Hematological: Negative.   Psychiatric/Behavioral: Negative.        Objective:   Physical Exam  Constitutional: He appears well-developed and well-nourished. No distress.  Morbidly obese  HENT:  Head: Normocephalic and atraumatic.  Eyes: Conjunctivae and EOM are normal. Pupils are equal, round, and reactive to light.  Neck: Normal range of motion. Neck supple. No tracheal deviation present.  Pulmonary/Chest: Effort normal. No stridor.  Skin: He is not diaphoretic.   MSK: No significant swelling or effusion  noted on the right knee when compared to the left. No associated color changes. Full ROM with mild crepitus on the Right and Left knee. No tenderness along the joint line or patella of the right knee. Tenderness to palpation over the right fibular head.  Excess motion of fibular head to translation Negative right-sided McMurray's, Lochman's, Anterior Drawer test. Gait normal.       Assessment & Plan:  #Right Fibular ligament tear with pain As evidenced by tenderness to palpation over the fibular head. Unlikely to be OA since patient has mild crepitus and full ROM despite his age and obesity. We are about 2-3 months from the original injury. Recommended knee compression sleeve during working hours to add stability and continue the healing process. -Knee Compression Sleeve given -Slowly return to activity -RTC PRN if symptoms worsen

## 2017-04-02 ENCOUNTER — Other Ambulatory Visit: Payer: Self-pay | Admitting: Adult Health

## 2017-04-04 ENCOUNTER — Other Ambulatory Visit: Payer: Self-pay | Admitting: Family Medicine

## 2017-04-04 DIAGNOSIS — H2513 Age-related nuclear cataract, bilateral: Secondary | ICD-10-CM | POA: Diagnosis not present

## 2017-04-04 DIAGNOSIS — E119 Type 2 diabetes mellitus without complications: Secondary | ICD-10-CM | POA: Diagnosis not present

## 2017-04-04 MED ORDER — ZAFIRLUKAST 20 MG PO TABS: 20.0000 mg | ORAL_TABLET | Freq: Two times a day (BID) | ORAL | 0 refills | Status: DC

## 2017-04-21 ENCOUNTER — Ambulatory Visit: Payer: Medicare Other | Admitting: Internal Medicine

## 2017-04-21 ENCOUNTER — Encounter: Payer: Self-pay | Admitting: Internal Medicine

## 2017-04-21 DIAGNOSIS — I4510 Unspecified right bundle-branch block: Secondary | ICD-10-CM

## 2017-04-21 DIAGNOSIS — I351 Nonrheumatic aortic (valve) insufficiency: Secondary | ICD-10-CM

## 2017-04-21 DIAGNOSIS — R0602 Shortness of breath: Secondary | ICD-10-CM

## 2017-04-21 NOTE — Patient Instructions (Signed)
Your physician wants you to follow-up in: 6 months with Dr. Hilty. You will receive a reminder letter in the mail two months in advance. If you don't receive a letter, please call our office to schedule the follow-up appointment.    

## 2017-04-21 NOTE — Progress Notes (Signed)
OFFICE CONSULT NOTE  Chief Complaint:  Routine follow-up  Primary Care Physician: Donita BrooksPickard, Warren T, MD  HPI:  William Howell is a 77 y.o. male who is being seen today for the evaluation of DOE and fatigue at the request of Dr. Kendrick FriesMcQuaid. William Howell has a history of morbid obesity, type 2 diabetes, hypertension, asthma and obstructive sleep apnea on CPAP. He reports at least 3 or 4 years of significant dyspnea on exertion. His weight is been fairly stable although at one time in the past he lost more than 50 pounds on a commercial weight loss diet but regained the weight. Recently he's been working with his pulmonologist for shortness of breath. He says in general is improved however he is concerned about possible underlying coronary disease. Certainly has multiple coronary risk factors and a family history of heart attack in his father who also had pulmonary disease and it may been related to using his inhaler. He also has a sister who died of heart attack. William Howell feels short of breath when exerting himself, particularly walking up stairs or doing some physical activities. He denies any chest pain, pressure, heaviness or associated symptoms.  10/07/2016  William Howell returns today for follow-up. He underwent a nuclear stress test which was negative for ischemia but showed a fixed inferior defect. This could potentially be scar however suspect that likely bowel attenuation artifact given his obesity. The echocardiogram showed normal inferior wall motion in that territory without any evidence of scar. Echo did also showed normal systolic function, mild LVH, normal diastolic dysfunction and mild aortic insufficiency. I don't believe the degree of aortic insufficiency that he has would explain his shortness of breath. Rather think his symptoms are multifactorial including obesity, asthma and perhaps upper airway resistance / poor diaphragmatic excursion.  04/21/2017  William Howell was seen  today in follow-up.  He denies any worsening shortness of breath or chest pain.  He is down a few pounds and has lost more weight but gained some back during the holidays.  He previously had lost about 60 pounds with commercial weight loss program, but gained the weight back.  He says he is not currently interested in a referral to weight loss program because he is busy managing to farms.  He understands he needs to continue to lose weight.  PMHx:  Past Medical History:  Diagnosis Date  . Asthma   . Diabetes mellitus type II, controlled (HCC)   . Hypertension   . Obesity   . Sleep apnea    uses CPAP    Past Surgical History:  Procedure Laterality Date  . CHOLECYSTECTOMY      FAMHx:  Family History  Problem Relation Age of Onset  . Heart disease Father   . Heart attack Father   . Heart attack Sister     SOCHx:   reports that  has never smoked. His smokeless tobacco use includes chew. He reports that he does not drink alcohol or use drugs.  ALLERGIES:  No Known Allergies  ROS: Pertinent items noted in HPI and remainder of comprehensive ROS otherwise negative.  HOME MEDS: Current Outpatient Medications on File Prior to Visit  Medication Sig Dispense Refill  . Albuterol Sulfate 108 (90 Base) MCG/ACT AEPB Inhale 1-2 puffs every 6 (six) hours as needed into the lungs.    Marland Kitchen. aspirin EC 81 MG tablet Take 81 mg daily by mouth.    . budesonide-formoterol (SYMBICORT) 160-4.5 MCG/ACT inhaler Inhale 2 puffs 2 (two)  times daily into the lungs.    . cetirizine (ZYRTEC) 10 MG tablet Take 10 mg daily by mouth.    Marland Kitchen glucosamine-chondroitin 500-400 MG tablet Take 1 tablet 2 (two) times daily by mouth.    . losartan (COZAAR) 50 MG tablet Take 50 mg daily by mouth.    . Multiple Vitamins-Minerals (MULTIVITAMIN WITH MINERALS) tablet Take 1 tablet daily by mouth.    . ranitidine (ZANTAC) 150 MG tablet Take 150 mg daily by mouth.    . zafirlukast (ACCOLATE) 20 MG tablet Take 1 tablet (20 mg  total) by mouth 2 (two) times daily before a meal. 60 tablet 0   No current facility-administered medications on file prior to visit.     LABS/IMAGING: No results found for this or any previous visit (from the past 48 hour(s)). No results found.  LIPID PANEL:    Component Value Date/Time   CHOL 146 02/19/2016 0918   TRIG 102 02/19/2016 0918   HDL 44 02/19/2016 0918   CHOLHDL 3.3 02/19/2016 0918   VLDL 20 02/19/2016 0918   LDLCALC 82 02/19/2016 0918    WEIGHTS: Wt Readings from Last 3 Encounters:  04/21/17 (!) 323 lb 9.6 oz (146.8 kg)  03/18/17 (!) 322 lb (146.1 kg)  02/17/17 (!) 318 lb (144.2 kg)    VITALS: BP (!) 154/85   Pulse 84   Ht 5\' 9"  (1.753 m)   Wt (!) 323 lb 9.6 oz (146.8 kg)   BMI 47.79 kg/m   EXAM: General appearance: alert, no distress and morbidly obese Neck: no carotid bruit, no JVD and thyroid not enlarged, symmetric, no tenderness/mass/nodules Lungs: clear to auscultation bilaterally Heart: regular rate and rhythm, S1, S2 normal and diastolic murmur: early diastolic 2/6, blowing at 2nd right intercostal space Abdomen: soft, non-tender; bowel sounds normal; no masses,  no organomegaly Extremities: extremities normal, atraumatic, no cyanosis or edema Pulses: 2+ and symmetric Skin: Skin color, texture, turgor normal. No rashes or lesions Neurologic: Grossly normal Psych: Pleasant  EKG: Sinus rhythm with PACs at 84, RBBB-personally reviewed  ASSESSMENT: 1. Progressive dyspnea on exertion and fatigue - low risk stress test, fixed inferior attenuation artifact (09/2016) 2. LVEF 60-65%, mild AI, mild LVH (09/2016) 3. Morbid obesity 4. RBBB 5. Hypertension 6. Dyslipidemia 7. Asthma 8. OSA on CPAP  PLAN: 1.   William Howell in his shortness of breath and fatigue.  Workup was essentially negative with mild aortic insufficiency.  I still think a lot of his symptoms are related to his obesity.  He needs to make a significant  effort in losing the weight.  I recommended referral to a weight management specialist or commercial weight loss center however he says he wants to try to do it on his own.  I also recommended a nutrition consult which she declined at this time.  Hopefully he will be successful in losing the weight.  We will plan to see him back in 6 months and further review his symptoms.   Chrystie Nose, MD, [facility], FACP  [place]  [facility]  Medical Director of the [facility]  Card[facility]ovascular Risk Reduction Clinic Diplomate of the American Board of Clinical Lipidology Attending Cardiologist  Direct Dial: [phone]  Fax: [phone]  Website:  [email or web address] 04/21/2017, 11:54 AM

## 2017-05-05 ENCOUNTER — Other Ambulatory Visit: Payer: Self-pay | Admitting: Family Medicine

## 2017-05-09 ENCOUNTER — Encounter: Payer: Self-pay | Admitting: Family Medicine

## 2017-05-09 ENCOUNTER — Ambulatory Visit (INDEPENDENT_AMBULATORY_CARE_PROVIDER_SITE_OTHER): Payer: Medicare Other | Admitting: Family Medicine

## 2017-05-09 VITALS — BP 132/86 | HR 80 | Temp 98.1°F | Resp 16 | Ht 69.0 in | Wt 327.0 lb

## 2017-05-09 DIAGNOSIS — Z1211 Encounter for screening for malignant neoplasm of colon: Secondary | ICD-10-CM | POA: Diagnosis not present

## 2017-05-09 DIAGNOSIS — E119 Type 2 diabetes mellitus without complications: Secondary | ICD-10-CM | POA: Diagnosis not present

## 2017-05-09 DIAGNOSIS — G4730 Sleep apnea, unspecified: Secondary | ICD-10-CM

## 2017-05-09 DIAGNOSIS — I351 Nonrheumatic aortic (valve) insufficiency: Secondary | ICD-10-CM

## 2017-05-09 DIAGNOSIS — E6609 Other obesity due to excess calories: Secondary | ICD-10-CM

## 2017-05-09 DIAGNOSIS — Z125 Encounter for screening for malignant neoplasm of prostate: Secondary | ICD-10-CM | POA: Diagnosis not present

## 2017-05-09 DIAGNOSIS — Z0000 Encounter for general adult medical examination without abnormal findings: Secondary | ICD-10-CM | POA: Diagnosis not present

## 2017-05-09 MED ORDER — OMEPRAZOLE 40 MG PO CPDR: 40.0000 mg | DELAYED_RELEASE_CAPSULE | Freq: Every day | ORAL | 3 refills | Status: DC

## 2017-05-09 NOTE — Progress Notes (Signed)
Subjective:    Patient ID: William Howell, male    DOB: 04/14/1941, 77 y.o.   MRN: [id number]  HPI Patient is here today for complete physical exam. Colonoscopy is up-to-date. Immunizations are listed below and are up-to-date. He is due for prostate cancer screening with PSA. Past medical history significant for obstructive sleep apnea, morbid obesity, diet-controlled diabetes mellitus. In June of last year, he had a stress test that revealed possible old remote infarct but no reversible ischemia. Echocardiogram was relatively normal.  He is due for colonoscopy Immunization History  Administered Date(s) Administered  . Influenza, High Dose Seasonal PF 02/17/2017  . Influenza,inj,Quad PF,6+ Mos 02/10/2014, 03/14/2015, 01/09/2016  . Pneumococcal Conjugate-13 07/04/2014  . Pneumococcal Polysaccharide-23 12/26/2011  . Td 12/26/2011  . Tdap 12/26/2011  . Zoster 02/10/2014    Past Medical History:  Diagnosis Date  . Asthma   . Diabetes mellitus type II, controlled (HCC)   . Hypertension   . Obesity   . Sleep apnea    uses CPAP   Past Surgical History:  Procedure Laterality Date  . CHOLECYSTECTOMY     Current Outpatient Medications on File Prior to Visit  Medication Sig Dispense Refill  . Albuterol Sulfate 108 (90 Base) MCG/ACT AEPB Inhale 1-2 puffs every 6 (six) hours as needed into the lungs.    Marland Kitchen aspirin EC 81 MG tablet Take 81 mg daily by mouth.    . budesonide-formoterol (SYMBICORT) 160-4.5 MCG/ACT inhaler Inhale 2 puffs 2 (two) times daily into the lungs.    . Calcium Carb-Cholecalciferol (CALCIUM 1000 + D PO) Take 1 tablet by mouth 2 (two) times daily.    . cetirizine (ZYRTEC) 10 MG tablet Take 10 mg daily by mouth.    Marland Kitchen glucosamine-chondroitin 500-400 MG tablet Take 1 tablet 2 (two) times daily by mouth.    . losartan (COZAAR) 50 MG tablet Take 50 mg daily by mouth.    . Multiple Vitamins-Minerals (MULTIVITAMIN WITH MINERALS) tablet Take 1 tablet daily by mouth.    .  ranitidine (ZANTAC) 150 MG tablet Take 150 mg daily by mouth.    . zafirlukast (ACCOLATE) 20 MG tablet TAKE 1 TABLET BY MOUTH TWICE DAILY BEFORE A MEAL 60 tablet 3   No current facility-administered medications on file prior to visit.    No Known Allergies Social History   Socioeconomic History  . Marital status: Married    Spouse name: Not on file  . Number of children: Not on file  . Years of education: Not on file  . Highest education level: Not on file  Social Needs  . Financial resource strain: Not on file  . Food insecurity - worry: Not on file  . Food insecurity - inability: Not on file  . Transportation needs - medical: Not on file  . Transportation needs - non-medical: Not on file  Occupational History  . Not on file  Tobacco Use  . Smoking status: Never Smoker  . Smokeless tobacco: Current User    Types: Chew  Substance and Sexual Activity  . Alcohol use: No  . Drug use: No  . Sexual activity: Not on file  Other Topics Concern  . Not on file  Social History Narrative  . Not on file      Review of Systems  All other systems reviewed and are negative.      Objective:   Physical Exam  Constitutional: He is oriented to person, place, and time. He appears well-developed and well-nourished. No distress.  HENT:  Head: Normocephalic and atraumatic.  Right Ear: External ear normal.  Left Ear: External ear normal.  Nose: Nose normal.  Mouth/Throat: Oropharynx is clear and moist. No oropharyngeal exudate.  Eyes: Conjunctivae and EOM are normal. Pupils are equal, round, and reactive to light. Right eye exhibits no discharge. Left eye exhibits no discharge. No scleral icterus.  Neck: Normal range of motion. Neck supple. No JVD present. No tracheal deviation present. No thyromegaly present.  Cardiovascular: Normal rate, regular rhythm, normal heart sounds and intact distal pulses. Exam reveals no gallop and no friction rub.  No murmur heard. Pulmonary/Chest: Effort  normal and breath sounds normal. No stridor. No respiratory distress. He has no wheezes. He has no rales. He exhibits no tenderness.  Abdominal: Soft. Bowel sounds are normal. He exhibits no distension and no mass. There is no tenderness. There is no rebound and no guarding.  Musculoskeletal: Normal range of motion. He exhibits no edema.  Lymphadenopathy:    He has no cervical adenopathy.  Neurological: He is alert and oriented to person, place, and time. He has normal reflexes. He displays normal reflexes. No cranial nerve deficit. He exhibits normal muscle tone. Coordination normal.  Skin: Skin is warm. No rash noted. He is not diaphoretic. No erythema. No pallor.  Psychiatric: He has a normal mood and affect. His behavior is normal. Judgment and thought content normal.  Vitals reviewed.         Assessment & Plan:  Routine general medical examination at a health care facility - Plan: CBC with Differential/Platelet, COMPLETE METABOLIC PANEL WITH GFR  Sleep apnea, unspecified type  Obesity due to excess calories, unspecified classification, unspecified whether serious comorbidity present - Plan: CBC with Differential/Platelet, COMPLETE METABOLIC PANEL WITH GFR, Lipid panel  Aortic valve insufficiency, etiology of cardiac valve disease unspecified  Prostate cancer screening - Plan: PSA  Diet-controlled diabetes mellitus (HCC) - Plan: Hemoglobin A1c, Microalbumin, urine  Patient requests that I scheduled him for colonoscopy. Immunizations are up-to-date except for the shingles vaccine which we discussed today. He will check on the price first. History of diet-controlled diabetes mellitus. I will check a hemoglobin A1c, urine microalbumin as well as a fasting lipid panel. Goal hemoglobin A1c is less than 6.5. Goal LDL cholesterol is less than 100. I will screen the patient for prostate cancer with a PSA. The remainder of his preventative care is up-to-date

## 2017-05-10 LAB — LIPID PANEL
Cholesterol: 152 mg/dL (ref <200)
HDL: 48 mg/dL (ref >40)
LDL Cholesterol (Calc): 85 mg/dL (calc)
Non-HDL Cholesterol (Calc): 104 mg/dL (calc) (ref <130)
Total CHOL/HDL Ratio: 3.2 (calc) (ref <5.0)
Triglycerides: 99 mg/dL (ref <150)

## 2017-05-10 LAB — CBC WITH DIFFERENTIAL/PLATELET
Basophils Absolute: 54 cells/uL (ref 0–200)
Basophils Relative: 0.6 %
Eosinophils Absolute: 432 cells/uL (ref 15–500)
Eosinophils Relative: 4.8 %
HCT: 43 % (ref 38.5–50.0)
Hemoglobin: 14.3 g/dL (ref 13.2–17.1)
Lymphs Abs: 2664 cells/uL (ref 850–3900)
MCH: 28.9 pg (ref 27.0–33.0)
MCHC: 33.3 g/dL (ref 32.0–36.0)
MCV: 87 fL (ref 80.0–100.0)
MPV: 9.4 fL (ref 7.5–12.5)
Monocytes Relative: 9.4 %
Neutro Abs: 5004 cells/uL (ref 1500–7800)
Neutrophils Relative %: 55.6 %
Platelets: 339 10*3/uL (ref 140–400)
RBC: 4.94 10*6/uL (ref 4.20–5.80)
RDW: 13 % (ref 11.0–15.0)
Total Lymphocyte: 29.6 %
WBC mixed population: 846 cells/uL (ref 200–950)
WBC: 9 10*3/uL (ref 3.8–10.8)

## 2017-05-10 LAB — COMPLETE METABOLIC PANEL WITH GFR
AG Ratio: 1.6 (calc) (ref 1.0–2.5)
ALT: 18 U/L (ref 9–46)
AST: 20 U/L (ref 10–35)
Albumin: 4.1 g/dL (ref 3.6–5.1)
Alkaline phosphatase (APISO): 63 U/L (ref 40–115)
BUN/Creatinine Ratio: 16 (calc) (ref 6–22)
BUN: 21 mg/dL (ref 7–25)
CO2: 29 mmol/L (ref 20–32)
Calcium: 9.6 mg/dL (ref 8.6–10.3)
Chloride: 105 mmol/L (ref 98–110)
Creat: 1.35 mg/dL — ABNORMAL HIGH (ref 0.70–1.18)
GFR, Est African American: 59 mL/min/{1.73_m2} — ABNORMAL LOW (ref >=60)
GFR, Est Non African American: 51 mL/min/{1.73_m2} — ABNORMAL LOW (ref >=60)
Globulin: 2.6 g/dL (calc) (ref 1.9–3.7)
Glucose, Bld: 129 mg/dL — ABNORMAL HIGH (ref 65–99)
Potassium: 4.3 mmol/L (ref 3.5–5.3)
Sodium: 144 mmol/L (ref 135–146)
Total Bilirubin: 0.4 mg/dL (ref 0.2–1.2)
Total Protein: 6.7 g/dL (ref 6.1–8.1)

## 2017-05-10 LAB — HEMOGLOBIN A1C
Hgb A1c MFr Bld: 6.7 % of total Hgb — ABNORMAL HIGH (ref <5.7)
Mean Plasma Glucose: 146 (calc)
eAG (mmol/L): 8.1 (calc)

## 2017-05-10 LAB — PSA: PSA: 3.3 ng/mL (ref <=4.0)

## 2017-05-10 LAB — MICROALBUMIN, URINE: Microalb, Ur: 0.5 mg/dL

## 2017-05-12 ENCOUNTER — Encounter: Payer: Self-pay | Admitting: Family Medicine

## 2017-05-12 ENCOUNTER — Encounter: Payer: Self-pay | Admitting: Internal Medicine

## 2017-05-14 ENCOUNTER — Other Ambulatory Visit: Payer: Self-pay | Admitting: Family Medicine

## 2017-05-14 MED ORDER — METFORMIN HCL 500 MG PO TABS: 500.0000 mg | ORAL_TABLET | Freq: Two times a day (BID) | ORAL | 1 refills | Status: DC

## 2017-06-16 ENCOUNTER — Other Ambulatory Visit: Payer: Self-pay | Admitting: Family Medicine

## 2017-07-07 ENCOUNTER — Encounter: Payer: Self-pay | Admitting: Internal Medicine

## 2017-07-07 ENCOUNTER — Ambulatory Visit (AMBULATORY_SURGERY_CENTER): Payer: Self-pay | Admitting: *Deleted

## 2017-07-07 ENCOUNTER — Other Ambulatory Visit: Payer: Self-pay

## 2017-07-07 VITALS — Ht 69.0 in | Wt 326.0 lb

## 2017-07-07 DIAGNOSIS — Z1211 Encounter for screening for malignant neoplasm of colon: Secondary | ICD-10-CM

## 2017-07-07 NOTE — Progress Notes (Signed)
No egg or soy allergy known to patient  No issues with past sedation with any surgeries  or procedures, no intubation problems  No diet pills per patient No home 02 use per patient  No blood thinners per patient  Pt denies issues with constipation  No A fib or A flutter  EMMI video sent to pt's e mail -  Wife in [place] today with pt-    

## 2017-07-21 ENCOUNTER — Other Ambulatory Visit: Payer: Self-pay

## 2017-07-21 ENCOUNTER — Encounter: Payer: Self-pay | Admitting: Internal Medicine

## 2017-07-21 ENCOUNTER — Ambulatory Visit (AMBULATORY_SURGERY_CENTER): Payer: Medicare Other | Admitting: Internal Medicine

## 2017-07-21 VITALS — BP 128/60 | HR 73 | Temp 99.1°F | Resp 15 | Ht 69.0 in | Wt 327.0 lb

## 2017-07-21 DIAGNOSIS — Z1211 Encounter for screening for malignant neoplasm of colon: Secondary | ICD-10-CM | POA: Diagnosis present

## 2017-07-21 DIAGNOSIS — Z1212 Encounter for screening for malignant neoplasm of rectum: Secondary | ICD-10-CM

## 2017-07-21 DIAGNOSIS — Z6841 Body Mass Index (BMI) 40.0 and over, adult: Secondary | ICD-10-CM | POA: Diagnosis not present

## 2017-07-21 DIAGNOSIS — J45909 Unspecified asthma, uncomplicated: Secondary | ICD-10-CM | POA: Diagnosis not present

## 2017-07-21 MED ORDER — SODIUM CHLORIDE 0.9 % IV SOLN: 500.0000 mL | Freq: Once | INTRAVENOUS | Status: DC

## 2017-07-21 NOTE — Progress Notes (Signed)
Pt's states no medical or surgical changes since previsit or office visit. 

## 2017-07-21 NOTE — Progress Notes (Signed)
Report given to PACU, vss 

## 2017-07-21 NOTE — Op Note (Signed)
[facility] Patient Name: William ShuttersRussell Howell Procedure Date: 07/21/2017 1:32 PM MRN: [id number] Endoscopist: Iva Booparl E Gessner , MD Age: [age] Referring MD:  Date of Birth: 06/26/1940 Gender: Male Account #: [id number] Procedure:                Colonoscopy Indications:              Screening for colorectal malignant neoplasm, This                            is the patient's first colonoscopy Medicines:                Propofol per Anesthesia, Monitored Anesthesia Care Procedure:                Pre-Anesthesia Assessment:                           - Prior to the procedure, a History and Physical                            was performed, and patient medications and                            allergies were reviewed. The patient's tolerance of                            previous anesthesia was also reviewed. The risks                            and benefits of the procedure and the sedation                            options and risks were discussed with the patient.                            All questions were answered, and informed consent                            was obtained. Prior Anticoagulants: The patient has                            taken no previous anticoagulant or antiplatelet                            agents. ASA Grade Assessment: III - A patient with                            severe systemic disease. After reviewing the risks                            and benefits, the patient was deemed in                            satisfactory condition to undergo the procedure.  After obtaining informed consent, the colonoscope                            was passed under direct vision. Throughout the                            procedure, the patient's blood pressure, pulse, and                            oxygen saturations were monitored continuously. The                            Colonoscope was introduced through the anus and   advanced to the the cecum, identified by                            appendiceal orifice and ileocecal valve. The                            colonoscopy was performed without difficulty. The                            patient tolerated the procedure well. The quality                            of the bowel preparation was adequate. The                            ileocecal valve, appendiceal orifice, and rectum                            were photographed. The bowel preparation used was                            Miralax. Scope In: 1:39:29 PM Scope Out: 1:50:10 PM Scope Withdrawal Time: 0 hours 8 minutes 45 seconds  Total Procedure Duration: 0 hours 10 minutes 41 seconds  Findings:                 The perianal and digital rectal examinations were                            normal.                           Many small and large-mouthed diverticula were found                            in the entire colon.                           The exam was otherwise without abnormality on                            direct and retroflexion views. Complications:            No  immediate complications. Estimated Blood Loss:     Estimated blood loss: none. Impression:               - Diverticulosis in the entire examined colon.                           - The examination was otherwise normal on direct                            and retroflexion views.                           - No specimens collected. Recommendation:           - Patient has a contact number available for                            emergencies. The signs and symptoms of potential                            delayed complications were discussed with the                            patient. Return to normal activities tomorrow.                            Written discharge instructions were provided to the                            patient.                           - Resume previous diet.                           - Continue present  medications.                           - No repeat colonoscopy due to age and the absence                            of colonic polyps. Iva Boop, MD 07/21/2017 1:54:47 PM This report has been signed electronically.

## 2017-07-21 NOTE — Patient Instructions (Addendum)
   No polyps or cancer seen.  You do have diverticulosis - thickened muscle rings and pouches in the colon wall. Please read the handout about this condition.  I appreciate the opportunity to care for you.  Iva Booparl E. Gessner, MD, [facility]  **Handout given on Diverticulosis**  YOU HAD AN ENDOSCOPIC PROCEDURE TODAY: Refer to the procedure report and other information in the discharge instructions given to you for any specific questions about what was found during the examination. If this information does not answer your questions, please call [facility] office at [phone] to clarify.   YOU SHOULD EXPECT: Some feelings of bloating in the abdomen. Passage of more gas than usual. Walking can help get rid of the air that was put into your GI tract during the procedure and reduce the bloating. If you had a lower endoscopy (such as a colonoscopy or flexible sigmoidoscopy) you may notice spotting of blood in your stool or on the toilet paper. Some abdominal soreness may be present for a day or two, also.  DIET: Your first meal following the procedure should be a light meal and then it is ok to progress to your normal diet. A half-sandwich or bowl of soup is an example of a good first meal. Heavy or fried foods are harder to digest and may make you feel nauseous or bloated. Drink plenty of fluids but you should avoid alcoholic beverages for 24 hours. If you had a esophageal dilation, please see attached instructions for diet.    ACTIVITY: Your care partner should take you home directly after the procedure. You should plan to take it easy, moving slowly for the rest of the day. You can resume normal activity the day after the procedure however YOU SHOULD NOT DRIVE, use power tools, machinery or perform tasks that involve climbing or major physical exertion for 24 hours (because of the sedation medicines used during the test).   SYMPTOMS TO REPORT IMMEDIATELY: A gastroenterologist can be reached at any hour.  Please call [phone]  for any of the following symptoms:  Following lower endoscopy (colonoscopy, flexible sigmoidoscopy) Excessive amounts of blood in the stool  Significant tenderness, worsening of abdominal pains  Swelling of the abdomen that is new, acute  Fever of 100 or higher    FOLLOW UP:  If any biopsies were taken you will be contacted by phone or by letter within the next 1-3 weeks. Call [phone]  if you have not heard about the biopsies in 3 weeks.  Please also call with any specific questions about appointments or follow up tests.

## 2017-07-22 ENCOUNTER — Telehealth: Payer: Self-pay

## 2017-07-22 NOTE — Telephone Encounter (Signed)
  Follow up Call-  Call back number 07/21/2017  Post procedure Call Back phone  # #[phone] cell   Permission to leave phone message Yes  Some recent data might be hidden     Patient questions:  Do you have a fever, pain , or abdominal swelling? No. Pain Score  0 *  Have you tolerated food without any problems? Yes.    Have you been able to return to your normal activities? Yes.    Do you have any questions about your discharge instructions: Diet   No. Medications  No. Follow up visit  No.  Do you have questions or concerns about your Care? No.  Actions: * If pain score is 4 or above: No action needed, pain <4.

## 2017-08-29 ENCOUNTER — Other Ambulatory Visit: Payer: Self-pay | Admitting: Family Medicine

## 2017-09-06 ENCOUNTER — Other Ambulatory Visit: Payer: Self-pay | Admitting: Family Medicine

## 2017-09-22 ENCOUNTER — Encounter: Payer: Self-pay | Admitting: Pulmonary Disease

## 2017-09-22 ENCOUNTER — Ambulatory Visit: Payer: Medicare Other | Admitting: Pulmonary Disease

## 2017-09-22 VITALS — BP 128/82 | HR 72 | Ht 69.0 in | Wt 317.0 lb

## 2017-09-22 DIAGNOSIS — J4540 Moderate persistent asthma, uncomplicated: Secondary | ICD-10-CM | POA: Diagnosis not present

## 2017-09-22 NOTE — Patient Instructions (Signed)
Moderate persistent asthma: Continue Symbicort 2 puffs twice a day no matter how you feel Use albuterol as needed for chest tightness wheezing or shortness of breath I think it would be a good idea for you to start exercising on a regular basis Get a flu shot in the fall  Follow-up with me one year or sooner if needed

## 2017-09-22 NOTE — Progress Notes (Signed)
Subjective:    Patient ID: William Howell, male    DOB: 11-28-1940, 77 y.o.   MRN: [id number]  Synopsis: Referred in August 2017 for evaluation of shortness of breath related to asthma.  He has asthma and obstructive sleep apnea.    HPI Chief Complaint  Patient presents with  . Follow-up    6 month ROV     William Howell says that in the last year his breathing has been doing pretty well.  He is not exercising.  [place]  He has not needed to use albuterol rescue inhaler in the last year.  Take Symbicort 2 puffs twice a day.  His knee is feeling better last year when I saw him.  Past Medical History:  Diagnosis Date  . Allergy   . Asthma   . Diabetes mellitus type II, controlled (HCC)   . GERD (gastroesophageal reflux disease)    pt states he takes omeprazole but doesnt have GERD  . Hypertension   . Obesity   . Sleep apnea    uses CPAP       Review of Systems  Constitutional: Negative for chills, fatigue and fever.  HENT: Negative for postnasal drip, rhinorrhea, sinus pressure and sinus pain.   Respiratory: Negative for cough, shortness of breath and wheezing.   Cardiovascular: Negative for chest pain, palpitations and leg swelling.       Objective:   Physical Exam  Vitals:   09/22/17 1029  BP: 128/82  Pulse: 72  SpO2: 95%  Weight: (!) 317 lb (143.8 kg)  Height: 5\' 9"  (1.753 m)    RA   Gen: obese but well appearing HENT: OP clear, TM's clear, neck supple PULM: CTA B, normal percussion CV: RRR, no mgr, trace edema GI: BS+, soft, nontender Derm: no cyanosis or rash Psyche: normal mood and affect     Records from his December 2017 visit with pulmonary interstitial practitioner reviewed were he had bronchitis and was treated with prednisone and Augmentin.  PFT: September 2017 lung function testing ratio 80%, FEV1 2.28 L 77% predicted, FVC 2.86 L 70% predicted, total lung capacity 5.07 L 74% predicted, DLCO 26.92 86%  predicted  Labs: August 2017 proBNP normal August 2017 chemistry with slightly elevated bicarbonate, hemoglobin 15.0   Imaging: October 2017 high-resolution CT chest images personally reviewed showing air-trapping consistent with small airways disease, some mild scattered patchy groundglass but nothing to suggest underlying interstitial lung disease. 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  CBC    Component Value Date/Time   WBC 9.0 05/09/2017 0857   RBC 4.94 05/09/2017 0857   HGB 14.3 05/09/2017 0857   HCT 43.0 05/09/2017 0857   PLT 339 05/09/2017 0857   MCV 87.0 05/09/2017 0857   MCH 28.9 05/09/2017 0857   MCHC 33.3 05/09/2017 0857   RDW 13.0 05/09/2017 0857   LYMPHSABS 2,664 05/09/2017 0857   MONOABS 1.1 (H) 07/22/2016 1011   EOSABS 432 05/09/2017 0857   BASOSABS 54 05/09/2017 0857       Assessment & Plan:    Moderate persistent chronic asthma without complication  Discussion: This has been a stable interval for William Howell.  He has not had an exacerbation of his asthma since the last visit and his disease is well controlled.  At this time I think the best thing he can do for himself moving forward is to start to exercise more frequently.  We talked about this at length today.  Plan: Moderate persistent  asthma: Continue Symbicort 2 puffs twice a day no matter how you feel Use albuterol as needed for chest tightness wheezing or shortness of breath I think it would be a good idea for you to start exercising on a regular basis Get a flu shot in the fall  Follow-up with me one year or sooner if needed    Current Outpatient Medications:  .  Albuterol Sulfate 108 (90 Base) MCG/ACT AEPB, Inhale 1-2 puffs every 6 (six) hours as needed into the lungs., Disp: , Rfl:  .  aspirin EC 81 MG tablet, Take 81 mg daily by mouth., Disp: , Rfl:  .  budesonide-formoterol (SYMBICORT) 160-4.5 MCG/ACT inhaler, Inhale 2 puffs 2 (two) times daily into the lungs., Disp: , Rfl:  .   Calcium Carb-Cholecalciferol (CALCIUM 1000 + D PO), Take 1 tablet by mouth 2 (two) times daily., Disp: , Rfl:  .  cetirizine (ZYRTEC) 10 MG tablet, TAKE 1 TABLET BY MOUTH ONCE DAILY, Disp: 30 tablet, Rfl: 11 .  glucosamine-chondroitin 500-400 MG tablet, Take 1 tablet 2 (two) times daily by mouth., Disp: , Rfl:  .  losartan (COZAAR) 50 MG tablet, Take 50 mg daily by mouth., Disp: , Rfl:  .  metFORMIN (GLUCOPHAGE) 500 MG tablet, Take 1 tablet (500 mg total) by mouth 2 (two) times daily with a meal., Disp: 180 tablet, Rfl: 1 .  Multiple Vitamins-Minerals (MULTIVITAMIN WITH MINERALS) tablet, Take 1 tablet daily by mouth., Disp: , Rfl:  .  Omega-3 Fatty Acids (FISH OIL) 1000 MG CAPS, Take 1 capsule by mouth daily., Disp: , Rfl:  .  omeprazole (PRILOSEC) 40 MG capsule, TAKE 1 CAPSULE BY MOUTH DAILY, Disp: 30 capsule, Rfl: 3 .  zafirlukast (ACCOLATE) 20 MG tablet, TAKE 1 TABLET BY MOUTH TWICE DAILY BEFORE  A  MEAL., Disp: 180 tablet, Rfl: 1 .  ranitidine (ZANTAC) 150 MG tablet, Take 150 mg daily by mouth., Disp: , Rfl:   Current Facility-Administered Medications:  .  0.9 %  sodium chloride infusion, 500 mL, Intravenous, Once, Leone Payor Maryjean Morn, MD

## 2017-10-19 NOTE — Progress Notes (Signed)
Cardiology Office Note   Date:  10/20/2017   ID:  William Howell, DOB 12/31/1940, MRN [id number]  PCP:  Donita Brooks, MD  Cardiologist: Dr. Rennis Golden  Chief Complaint  Patient presents with  . Follow-up   History of Present Illness: William Howell is a 77 y.o. male who presents for ongoing assessment and management of chronic dyspnea, history of OSA on CPAP, type 2 diabetes, hypertension.  He was last seen in the office by Dr. Rennis Golden on 04/21/2017, denied any worsening dyspnea or chest pain.   The patient had a history of significant weight loss on a commercial weight loss program but gained weight back, and continues to have issues with obesity.  Most recent echocardiogram was essentially negative with mild aortic valve insufficiency.  It was felt by Dr. Rennis Golden the majority of his symptoms concerning his dyspnea were related to his obesity.  He was offered referral to weight loss management specialist at that time but refused.   He comes today with continued complaints of fatigue. 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 His wife states that he falls asleep sitting at dinner. He does have OSA and is compliant with CPAP. This is managed by Dr.McQuaid. He denies chest pain, but breathing his harder in humid weather. He is not adhering to weight loss regimen/diet.   Past Medical History:  Diagnosis Date  . Allergy   . Asthma   . Diabetes mellitus type II, controlled (HCC)   . GERD (gastroesophageal reflux disease)    pt states he takes omeprazole but doesnt have GERD  . Hypertension   . Obesity   . Sleep apnea    uses CPAP    Past Surgical History:  Procedure Laterality Date  . CHOLECYSTECTOMY    . DENTAL SURGERY       Current Outpatient Medications  Medication Sig Dispense Refill  . Albuterol Sulfate 108 (90 Base) MCG/ACT AEPB Inhale 1-2 puffs every 6 (six) hours as needed into the lungs.    Marland Kitchen aspirin EC 81 MG tablet Take 81 mg  daily by mouth.    . B Complex Vitamins (B COMPLEX 1 PO) Take by mouth.    . budesonide-formoterol (SYMBICORT) 160-4.5 MCG/ACT inhaler Inhale 2 puffs 2 (two) times daily into the lungs.    . Calcium Carb-Cholecalciferol (CALCIUM 1000 + D PO) Take 1 tablet by mouth 2 (two) times daily.    . cetirizine (ZYRTEC) 10 MG tablet TAKE 1 TABLET BY MOUTH ONCE DAILY 30 tablet 11  . glucosamine-chondroitin 500-400 MG tablet Take 1 tablet 2 (two) times daily by mouth.    . losartan (COZAAR) 50 MG tablet Take 50 mg daily by mouth.    . metFORMIN (GLUCOPHAGE) 500 MG tablet Take 1 tablet (500 mg total) by mouth 2 (two) times daily with a meal. 180 tablet 1  . Multiple Vitamins-Minerals (MULTIVITAMIN WITH MINERALS) tablet Take 1 tablet daily by mouth.    . Omega-3 Fatty Acids (FISH OIL) 1000 MG CAPS Take 1 capsule by mouth daily.    Marland Kitchen omeprazole (PRILOSEC) 40 MG capsule TAKE 1 CAPSULE BY MOUTH DAILY 30 capsule 3  . ranitidine (ZANTAC) 150 MG tablet Take 150 mg daily by mouth.    . zafirlukast (ACCOLATE) 20 MG tablet TAKE 1 TABLET BY MOUTH TWICE DAILY BEFORE  A  MEAL. 180 tablet 1   Current Facility-Administered Medications  Medication Dose Route Frequency Provider Last Rate Last Dose  .  0.9 %  sodium chloride infusion  500 mL Intravenous Once Iva BoopGessner, Carl E, MD        Allergies:   Patient has no known allergies.    Social History:  The patient  reports that he has never smoked. His smokeless tobacco use includes chew. He reports that he drinks alcohol. He reports that he does not use drugs.   Family History:  The patient's family history includes Heart attack in his father and sister; Heart disease in his father.    ROS: All other systems are reviewed and negative. Unless otherwise mentioned in H&P    PHYSICAL EXAM: VS:  BP 132/82   Pulse 78   Ht 5\' 9"  (1.753 m)   Wt (!) 323 lb (146.5 kg)   BMI 47.70 kg/m  , BMI Body mass index is 47.7 kg/m. GEN: Well nourished, well developed, in no acute  distress Morbidly obese.  HEENT: normal  Neck: no JVD, carotid bruits, or masses Cardiac: RRR; no murmurs, rubs, or gallops,no edema  Respiratory: Clear to auscultation bilaterally, normal work of breathing GI: soft, nontender, nondistended, + BS MS: no deformity or atrophy  Skin: warm and dry, no rash Neuro:  Strength and sensation are intact Psych: euthymic mood, full affect   EKG:  Not completed this office visit.   Recent Labs: 05/09/2017: ALT 18; BUN 21; Creat 1.35; Hemoglobin 14.3; Platelets 339; Potassium 4.3; Sodium 144    Lipid Panel    Component Value Date/Time   CHOL 152 05/09/2017 0857   TRIG 99 05/09/2017 0857   HDL 48 05/09/2017 0857   CHOLHDL 3.2 05/09/2017 0857   VLDL 20 02/19/2016 0918   LDLCALC 85 05/09/2017 0857      Wt Readings from Last 3 Encounters:  10/20/17 (!) 323 lb (146.5 kg)  09/22/17 (!) 317 lb (143.8 kg)  07/21/17 (!) 327 lb (148.3 kg)      Other studies Reviewed: Echocardiogram 08/16/2016 Procedure narrative: Transthoracic echocardiography. Image   quality was suboptimal. The study was technically difficult.   Intravenous contrast (Definity) was administered. - Left ventricle: The cavity size was normal. Wall thickness was   increased in a pattern of mild LVH. Systolic function was normal.   The estimated ejection fraction was in the range of 60% to 65%.   Wall motion was normal; there were no regional wall motion   abnormalities. Left ventricular diastolic function parameters   were normal. - Aortic valve: There was mild regurgitation. - Left atrium: The atrium was mildly dilated. - Atrial septum: No defect or patent foramen ovale was identified.  NM Stress Test 09/17/2016 Study Highlights     Nuclear stress EF: 59%. No wall motion abnormalities  There was no ST segment deviation noted during stress.  Defect 1: There is a medium defect of moderate severity present in the basal inferoseptal, apical lateral and apex  location.  Findings consistent with prior myocardial infarction with peri-infarct ischemia in the apical inferior distribution.  This is a low risk but abnormal study .   Donato SchultzMark Skains, MD     ASSESSMENT AND PLAN:  1. Chronic Fatigue: I believe this to be multifactorial in the setting of obesity, humid weather, inactivity, and poor diet. I have discussed with him the need to lose weight and be more active. I have also recommended that he increase his fluid intake with electrolyte supplement, not sugary Gator-Aid but supplements that can be added to water.   I will not plan any further cardiac testing  unless his symptoms worsen as he has had echo and NM stress test one year ago.   2. Obesity: He is given a heart healthy, diabetic diet to follow for information, I have encouraged him to be more mindful of his diet. He does not wish to see [facility].   3. Hypertension: BP is well controlled. No changes in his medication regimen.   4. Diabetes; He is to follow up with his PCP management.   5.  [id number] by pulmonology and is compliant with CPAP.    Current medicines are reviewed at length with the patient today.    Labs/ tests ordered today include:None  Bettey Mare. Liborio Nixon, ANP, AACC   10/20/2017 8:19 AM    [facility] [address], [place], [place] [address] Phone: [phone]; Fax: [phone]

## 2017-10-20 ENCOUNTER — Ambulatory Visit: Payer: Medicare Other | Admitting: Adult Health

## 2017-10-20 ENCOUNTER — Encounter: Payer: Self-pay | Admitting: Adult Health

## 2017-10-20 VITALS — BP 132/82 | HR 78 | Ht 69.0 in | Wt 323.0 lb

## 2017-10-20 DIAGNOSIS — Z9989 Dependence on other enabling machines and devices: Secondary | ICD-10-CM

## 2017-10-20 DIAGNOSIS — E118 Type 2 diabetes mellitus with unspecified complications: Secondary | ICD-10-CM | POA: Diagnosis not present

## 2017-10-20 DIAGNOSIS — I10 Essential (primary) hypertension: Secondary | ICD-10-CM

## 2017-10-20 DIAGNOSIS — G4733 Obstructive sleep apnea (adult) (pediatric): Secondary | ICD-10-CM | POA: Diagnosis not present

## 2017-10-20 DIAGNOSIS — R5383 Other fatigue: Secondary | ICD-10-CM

## 2017-10-20 NOTE — Patient Instructions (Signed)
Medication Instructions:  NO CHANGES- Your physician recommends that you continue on your current medications as directed. Please refer to the Current Medication list given to you today.  If you need a refill on your cardiac medications before your next appointment, please call your pharmacy.  Special Instructions: GET ELECTROLYTE PACKETS FOR WATER-ASK PHARMACIST IF YOU CANNOT FIND THIS OVER-THE-COUNTER  PLEASE FOLLOW UP HEART HEALTHY DIET (ATTACHED)  Follow-Up: Your physician wants you to follow-up in: 6 MONTHS  WITH DR HILTY You should receive a reminder letter in the mail two months in advance. If you do not receive a letter, please call our office NOV 2019 to schedule the JAN 2020 follow-up appointment.   Thank you for choosing [facility] at [facility]!!      Fat and Cholesterol Restricted Diet Getting too much fat and cholesterol in your diet may cause health problems. Following this diet helps keep your fat and cholesterol at normal levels. This can keep you from getting sick. What types of fat should I choose?  Choose monosaturated and polyunsaturated fats. These are found in foods such as olive oil, canola oil, flaxseeds, walnuts, almonds, and seeds.  Eat more omega-3 fats. Good choices include salmon, mackerel, sardines, tuna, flaxseed oil, and ground flaxseeds.  Limit saturated fats. These are in animal products such as meats, butter, and cream. They can also be in plant products such as palm oil, palm kernel oil, and coconut oil.  Avoid foods with partially hydrogenated oils in them. These contain trans fats. Examples of foods that have trans fats are stick margarine, some tub margarines, cookies, crackers, and other baked goods. What general guidelines do I need to follow?  Check food labels. Look for the words "trans fat" and "saturated fat."  When preparing a meal: ? Fill half of your plate with vegetables and green salads. ? Fill one fourth of your plate with  whole grains. Look for the word "whole" as the first word in the ingredient list. ? Fill one fourth of your plate with lean protein foods.  Eat more foods that have fiber, like apples, carrots, beans, peas, and barley.  Eat more home-cooked foods. Eat less at restaurants and buffets.  Limit or avoid alcohol.  Limit foods high in starch and sugar.  Limit fried foods.  Cook foods without frying them. Baking, boiling, grilling, and broiling are all great options.  Lose weight if you are overweight. Losing even a small amount of weight can help your overall health. It can also help prevent diseases such as diabetes and heart disease. What foods can I eat? Grains Whole grains, such as whole wheat or whole grain breads, crackers, cereals, and pasta. Unsweetened oatmeal, bulgur, barley, quinoa, or brown rice. Corn or whole wheat flour tortillas. Vegetables Fresh or frozen vegetables (raw, steamed, roasted, or grilled). Green salads. Fruits All fresh, canned (in natural juice), or frozen fruits. Meat and Other Protein Products Ground beef (85% or leaner), grass-fed beef, or beef trimmed of fat. Skinless chicken or [place]. Ground chicken or [place]. Pork trimmed of fat. All fish and seafood. Eggs. Dried beans, peas, or lentils. Unsalted nuts or seeds. Unsalted canned or dry beans. Dairy Low-fat dairy products, such as skim or 1% milk, 2% or reduced-fat cheeses, low-fat ricotta or cottage cheese, or plain low-fat yogurt. Fats and Oils Tub margarines without trans fats. Light or reduced-fat mayonnaise and salad dressings. Avocado. Olive, canola, sesame, or safflower oils. Natural peanut or almond butter (choose ones without added sugar and oil).  The items listed above may not be a complete list of recommended foods or beverages. Contact your dietitian for more options. What foods are not recommended? Grains White bread. White pasta. White rice. Cornbread. Bagels, pastries, and croissants.  Crackers that contain trans fat. Vegetables White potatoes. Corn. Creamed or fried vegetables. Vegetables in a cheese sauce. Fruits Dried fruits. Canned fruit in light or heavy syrup. Fruit juice. Meat and Other Protein Products Fatty cuts of meat. Ribs, chicken wings, bacon, sausage, bologna, salami, chitterlings, fatback, hot dogs, bratwurst, and packaged luncheon meats. Liver and organ meats. Dairy Whole or 2% milk, cream, half-and-half, and cream cheese. Whole milk cheeses. Whole-fat or sweetened yogurt. Full-fat cheeses. Nondairy creamers and whipped toppings. Processed cheese, cheese spreads, or cheese curds. Sweets and Desserts Corn syrup, sugars, honey, and molasses. Candy. Jam and jelly. Syrup. Sweetened cereals. Cookies, pies, cakes, donuts, muffins, and ice cream. Fats and Oils Butter, stick margarine, lard, shortening, ghee, or bacon fat. Coconut, palm kernel, or palm oils. Beverages Alcohol. Sweetened drinks (such as sodas, lemonade, and fruit drinks or punches). The items listed above may not be a complete list of foods and beverages to avoid. Contact your dietitian for more information. This information is not intended to replace advice given to you by your health care provider. Make sure you discuss any questions you have with your health care provider. Document Released: 10/01/2011 Document Revised: 12/07/2015 Document Reviewed: 07/01/2013 Elsevier Interactive Patient Education  2018 [employer] Inc.   Heart Healthy Diet-Reduced Cholesterol Cholesterol is a fat. Your body needs a small amount of cholesterol. Cholesterol (plaque) may build up in your blood vessels (arteries). That makes you more likely to have a heart attack or stroke. You cannot feel your cholesterol level. Having a blood test is the only way to find out if your level is high. Keep your test results. Work with your doctor to keep your cholesterol at a good level. What do the results mean?  Total cholesterol  is how much cholesterol is in your blood.  LDL is bad cholesterol. This is the type that can build up. Try to have low LDL.  HDL is good cholesterol. It cleans your blood vessels and carries LDL away. Try to have high HDL.  Triglycerides are fat that the body can store or burn for energy. What are good levels of cholesterol?  Total cholesterol below 200.  LDL below 100 is good for people who have health risks. LDL below 70 is good for people who have very high risks.  HDL above 40 is good. It is best to have HDL of 60 or higher.  Triglycerides below 150. How can I lower my cholesterol? Diet Follow your diet program as told by your doctor.  Choose fish, white meat chicken, or [place] that is roasted or baked. Try not to eat red meat, fried foods, sausage, or lunch meats.  Eat lots of fresh fruits and vegetables.  Choose whole grains, beans, pasta, potatoes, and cereals.  Choose olive oil, corn oil, or canola oil. Only use small amounts.  Try not to eat butter, mayonnaise, shortening, or palm kernel oils.  Try not to eat foods with trans fats.  Choose low-fat or nonfat dairy foods. ? Drink skim or nonfat milk. ? Eat low-fat or nonfat yogurt and cheeses. ? Try not to drink whole milk or cream. ? Try not to eat ice cream, egg yolks, or full-fat cheeses.  Healthy desserts include angel food cake, ginger snaps, animal crackers, hard candy, popsicles,  and low-fat or nonfat frozen yogurt. Try not to eat pastries, cakes, pies, and cookies.  Exercise Follow your exercise program as told by your doctor.  Be more active. Try gardening, walking, and taking the stairs.  Ask your doctor about ways that you can be more active.  Medicine  Take over-the-counter and prescription medicines only as told by your doctor. This information is not intended to replace advice given to you by your health care provider. Make sure you discuss any questions you have with your health care  provider. Document Released: 06/28/2008 Document Revised: 11/01/2015 Document Reviewed: 10/12/2015 Elsevier Interactive Patient Education  [employer].

## 2017-10-26 ENCOUNTER — Other Ambulatory Visit: Payer: Self-pay | Admitting: Family Medicine

## 2017-11-11 ENCOUNTER — Other Ambulatory Visit: Payer: Self-pay | Admitting: Pulmonary Disease

## 2017-11-18 ENCOUNTER — Other Ambulatory Visit: Payer: Self-pay | Admitting: Pulmonary Disease

## 2017-11-18 MED ORDER — BUDESONIDE-FORMOTEROL FUMARATE 160-4.5 MCG/ACT IN AERO: 2.0000 | INHALATION_SPRAY | Freq: Two times a day (BID) | RESPIRATORY_TRACT | 5 refills | Status: DC

## 2017-11-21 DIAGNOSIS — G4731 Primary central sleep apnea: Secondary | ICD-10-CM | POA: Diagnosis not present

## 2017-11-21 DIAGNOSIS — G4730 Sleep apnea, unspecified: Secondary | ICD-10-CM | POA: Diagnosis not present

## 2017-12-03 DIAGNOSIS — G4731 Primary central sleep apnea: Secondary | ICD-10-CM | POA: Diagnosis not present

## 2017-12-03 DIAGNOSIS — G4730 Sleep apnea, unspecified: Secondary | ICD-10-CM | POA: Diagnosis not present

## 2017-12-14 ENCOUNTER — Other Ambulatory Visit: Payer: Self-pay | Admitting: Family Medicine

## 2017-12-29 ENCOUNTER — Other Ambulatory Visit: Payer: Self-pay | Admitting: Family Medicine

## 2018-02-12 ENCOUNTER — Other Ambulatory Visit: Payer: Medicare Other

## 2018-02-12 ENCOUNTER — Ambulatory Visit (INDEPENDENT_AMBULATORY_CARE_PROVIDER_SITE_OTHER): Payer: Medicare Other

## 2018-02-12 DIAGNOSIS — R739 Hyperglycemia, unspecified: Secondary | ICD-10-CM

## 2018-02-12 DIAGNOSIS — Z23 Encounter for immunization: Secondary | ICD-10-CM

## 2018-02-12 DIAGNOSIS — E6609 Other obesity due to excess calories: Secondary | ICD-10-CM

## 2018-02-12 DIAGNOSIS — E119 Type 2 diabetes mellitus without complications: Secondary | ICD-10-CM | POA: Diagnosis not present

## 2018-02-12 NOTE — Progress Notes (Signed)
Patient was in office to receive flu shot. Patient received flu vaccine in his left deltoid patient tolerated well

## 2018-02-13 DIAGNOSIS — R399 Unspecified symptoms and signs involving the genitourinary system: Secondary | ICD-10-CM | POA: Diagnosis not present

## 2018-02-13 DIAGNOSIS — R300 Dysuria: Secondary | ICD-10-CM | POA: Diagnosis not present

## 2018-02-13 LAB — HEMOGLOBIN A1C
Hgb A1c MFr Bld: 6.2 % of total Hgb — ABNORMAL HIGH (ref <5.7)
Mean Plasma Glucose: 131 (calc)
eAG (mmol/L): 7.3 (calc)

## 2018-02-13 LAB — CBC WITH DIFFERENTIAL/PLATELET
Basophils Absolute: 55 cells/uL (ref 0–200)
Basophils Relative: 0.6 %
Eosinophils Absolute: 373 cells/uL (ref 15–500)
Eosinophils Relative: 4.1 %
HCT: 41.9 % (ref 38.5–50.0)
Hemoglobin: 14.2 g/dL (ref 13.2–17.1)
Lymphs Abs: 2703 cells/uL (ref 850–3900)
MCH: 30.1 pg (ref 27.0–33.0)
MCHC: 33.9 g/dL (ref 32.0–36.0)
MCV: 88.8 fL (ref 80.0–100.0)
MPV: 10.1 fL (ref 7.5–12.5)
Monocytes Relative: 9.9 %
Neutro Abs: 5069 cells/uL (ref 1500–7800)
Neutrophils Relative %: 55.7 %
Platelets: 335 10*3/uL (ref 140–400)
RBC: 4.72 10*6/uL (ref 4.20–5.80)
RDW: 12.4 % (ref 11.0–15.0)
Total Lymphocyte: 29.7 %
WBC mixed population: 901 cells/uL (ref 200–950)
WBC: 9.1 10*3/uL (ref 3.8–10.8)

## 2018-02-13 LAB — COMPREHENSIVE METABOLIC PANEL
AG Ratio: 1.8 (calc) (ref 1.0–2.5)
ALT: 21 U/L (ref 9–46)
AST: 18 U/L (ref 10–35)
Albumin: 4.2 g/dL (ref 3.6–5.1)
Alkaline phosphatase (APISO): 75 U/L (ref 40–115)
BUN/Creatinine Ratio: 17 (calc) (ref 6–22)
BUN: 22 mg/dL (ref 7–25)
CO2: 28 mmol/L (ref 20–32)
Calcium: 9.2 mg/dL (ref 8.6–10.3)
Chloride: 104 mmol/L (ref 98–110)
Creat: 1.33 mg/dL — ABNORMAL HIGH (ref 0.70–1.18)
Globulin: 2.4 g/dL (calc) (ref 1.9–3.7)
Glucose, Bld: 126 mg/dL — ABNORMAL HIGH (ref 65–99)
Potassium: 4.6 mmol/L (ref 3.5–5.3)
Sodium: 141 mmol/L (ref 135–146)
Total Bilirubin: 0.4 mg/dL (ref 0.2–1.2)
Total Protein: 6.6 g/dL (ref 6.1–8.1)

## 2018-02-13 LAB — LIPID PANEL
Cholesterol: 130 mg/dL (ref <200)
HDL: 37 mg/dL — ABNORMAL LOW (ref >40)
LDL Cholesterol (Calc): 75 mg/dL (calc)
Non-HDL Cholesterol (Calc): 93 mg/dL (calc) (ref <130)
Total CHOL/HDL Ratio: 3.5 (calc) (ref <5.0)
Triglycerides: 93 mg/dL (ref <150)

## 2018-02-15 DIAGNOSIS — R300 Dysuria: Secondary | ICD-10-CM | POA: Diagnosis not present

## 2018-02-17 ENCOUNTER — Ambulatory Visit: Payer: Medicare Other | Admitting: Family Medicine

## 2018-02-25 ENCOUNTER — Encounter: Payer: Self-pay | Admitting: Family Medicine

## 2018-03-08 ENCOUNTER — Other Ambulatory Visit: Payer: Self-pay | Admitting: Family Medicine

## 2018-04-30 ENCOUNTER — Encounter: Payer: Medicare Other | Admitting: Family Medicine

## 2018-05-04 ENCOUNTER — Ambulatory Visit (INDEPENDENT_AMBULATORY_CARE_PROVIDER_SITE_OTHER): Payer: Medicare Other | Admitting: Family Medicine

## 2018-05-04 ENCOUNTER — Encounter: Payer: Self-pay | Admitting: Family Medicine

## 2018-05-04 VITALS — BP 146/90 | HR 88 | Temp 97.7°F | Resp 20 | Ht 66.0 in | Wt 324.0 lb

## 2018-05-04 DIAGNOSIS — R300 Dysuria: Secondary | ICD-10-CM | POA: Diagnosis not present

## 2018-05-04 DIAGNOSIS — Z125 Encounter for screening for malignant neoplasm of prostate: Secondary | ICD-10-CM

## 2018-05-04 DIAGNOSIS — Z0000 Encounter for general adult medical examination without abnormal findings: Secondary | ICD-10-CM | POA: Diagnosis not present

## 2018-05-04 DIAGNOSIS — E119 Type 2 diabetes mellitus without complications: Secondary | ICD-10-CM

## 2018-05-04 LAB — URINALYSIS, ROUTINE W REFLEX MICROSCOPIC
Bilirubin Urine: NEGATIVE
Glucose, UA: NEGATIVE
Hgb urine dipstick: NEGATIVE
Ketones, ur: NEGATIVE
Leukocytes, UA: NEGATIVE
Nitrite: NEGATIVE
Protein, ur: NEGATIVE
Specific Gravity, Urine: 1.02 (ref 1.001–1.03)
pH: 6.5 (ref 5.0–8.0)

## 2018-05-04 MED ORDER — ALBUTEROL SULFATE 108 (90 BASE) MCG/ACT IN AEPB: 1.0000 | INHALATION_SPRAY | Freq: Four times a day (QID) | RESPIRATORY_TRACT | 3 refills | Status: DC | PRN

## 2018-05-04 MED ORDER — TAMSULOSIN HCL 0.4 MG PO CAPS: 0.4000 mg | ORAL_CAPSULE | Freq: Every day | ORAL | 3 refills | Status: DC

## 2018-05-04 NOTE — Progress Notes (Signed)
Subjective:    Patient ID: William Howell, male    DOB: 1940-04-28, 78 y.o.   MRN: [id number]  HPI Patient is here today for complete physical exam.  His colonoscopy was performed last year.  There were no abnormal findings.  Therefore he does not require repeat colonoscopy.  He is due for prostate cancer screening.  He also reports increased urinary frequency, urinary urgency, weak stream.  He frequently has urge incontinence.  He also reports a dribbling stream.  Occasionally he will dribble into his underwear.  Symptoms sound consistent with BPH.  Prostate exam was performed today and does show a enlarged prostate that is nontender.  Urinalysis is obtained today showing no evidence of an infection.  His most recent lab work is listed below: Office Visit on 05/04/2018  Component Date Value Ref Range Status  . Color, Urine 05/04/2018 YELLOW  YELLOW Final  . APPearance 05/04/2018 CLEAR  CLEAR Final  . Specific Gravity, Urine 05/04/2018 1.020  1.001 - 1.03 Final  . pH 05/04/2018 6.5  5.0 - 8.0 Final  . Glucose, UA 05/04/2018 NEGATIVE  NEGATIVE Final  . Bilirubin Urine 05/04/2018 NEGATIVE  NEGATIVE Final  . Ketones, ur 05/04/2018 NEGATIVE  NEGATIVE Final  . Hgb urine dipstick 05/04/2018 NEGATIVE  NEGATIVE Final  . Protein, ur 05/04/2018 NEGATIVE  NEGATIVE Final  . Nitrite 05/04/2018 NEGATIVE  NEGATIVE Final  . Leukocytes, UA 05/04/2018 NEGATIVE  NEGATIVE Final  Lab on 02/12/2018  Component Date Value Ref Range Status  . Hgb A1c MFr Bld 02/12/2018 6.2* <5.7 % of total Hgb Final   Comment: For someone without known diabetes, a hemoglobin  A1c value between 5.7% and 6.4% is consistent with prediabetes and should be confirmed with a  follow-up test. . For someone with known diabetes, a value <7% indicates that their diabetes is well controlled. A1c targets should be individualized based on duration of diabetes, age, comorbid conditions, and other considerations. . This assay result is  consistent with an increased risk of diabetes. . Currently, no consensus exists regarding use of hemoglobin A1c for diagnosis of diabetes for children. .   . Mean Plasma Glucose 02/12/2018 131  (calc) Final  . eAG (mmol/L) 02/12/2018 7.3  (calc) Final  . Cholesterol 02/12/2018 130  <200 mg/dL Final  . HDL 26/41/5830 37* >40 mg/dL Final  . Triglycerides 02/12/2018 93  <150 mg/dL Final  . LDL Cholesterol (Calc) 02/12/2018 75  mg/dL (calc) Final   Comment: Reference range: <100 . Desirable range <100 mg/dL for primary prevention;   <70 mg/dL for patients with CHD or diabetic patients  with > or = 2 CHD risk factors. Marland Kitchen LDL-C is now calculated using the Martin-Hopkins  calculation, which is a validated novel method providing  better accuracy than the Friedewald equation in the  estimation of LDL-C.  Horald Pollen et al. Lenox Ahr. 9407;680(88): 2061-2068  (http://education.[facility].com/faq/FAQ164)   . Total CHOL/HDL Ratio 02/12/2018 3.5  <1.1 (calc) Final  . Non-HDL Cholesterol (Calc) 02/12/2018 93  <130 mg/dL (calc) Final   Comment: For patients with diabetes plus 1 major ASCVD risk  factor, treating to a non-HDL-C goal of <100 mg/dL  (LDL-C of <03 mg/dL) is considered a therapeutic  option.   . Glucose, Bld 02/12/2018 126* 65 - 99 mg/dL Final   Comment: .            Fasting reference interval . For someone without known diabetes, a glucose value >125 mg/dL indicates that they may have diabetes and  this should be confirmed with a follow-up test. .   . BUN 02/12/2018 22  7 - 25 mg/dL Final  . Creat 16/01/9603 1.33* 0.70 - 1.18 mg/dL Final   Comment: For patients >45 years of age, the reference limit for Creatinine is approximately 13% higher for people identified as African-American. .   Edwena Felty Ratio 02/12/2018 17  6 - 22 (calc) Final  . Sodium 02/12/2018 141  135 - 146 mmol/L Final  . Potassium 02/12/2018 4.6  3.5 - 5.3 mmol/L Final  . Chloride 02/12/2018 104   98 - 110 mmol/L Final  . CO2 02/12/2018 28  20 - 32 mmol/L Final  . Calcium 02/12/2018 9.2  8.6 - 10.3 mg/dL Final  . Total Protein 02/12/2018 6.6  6.1 - 8.1 g/dL Final  . Albumin 54/12/8117 4.2  3.6 - 5.1 g/dL Final  . Globulin 14/78/2956 2.4  1.9 - 3.7 g/dL (calc) Final  . AG Ratio 02/12/2018 1.8  1.0 - 2.5 (calc) Final  . Total Bilirubin 02/12/2018 0.4  0.2 - 1.2 mg/dL Final  . Alkaline phosphatase (APISO) 02/12/2018 75  40 - 115 U/L Final  . AST 02/12/2018 18  10 - 35 U/L Final  . ALT 02/12/2018 21  9 - 46 U/L Final  . WBC 02/12/2018 9.1  3.8 - 10.8 Thousand/uL Final  . RBC 02/12/2018 4.72  4.20 - 5.80 Million/uL Final  . Hemoglobin 02/12/2018 14.2  13.2 - 17.1 g/dL Final  . HCT 21/30/8657 41.9  38.5 - 50.0 % Final  . MCV 02/12/2018 88.8  80.0 - 100.0 fL Final  . MCH 02/12/2018 30.1  27.0 - 33.0 pg Final  . MCHC 02/12/2018 33.9  32.0 - 36.0 g/dL Final  . RDW 84/69/6295 12.4  11.0 - 15.0 % Final  . Platelets 02/12/2018 335  140 - 400 Thousand/uL Final  . MPV 02/12/2018 10.1  7.5 - 12.5 fL Final  . Neutro Abs 02/12/2018 5,069  1,500 - 7,800 cells/uL Final  . Lymphs Abs 02/12/2018 2,703  850 - 3,900 cells/uL Final  . WBC mixed population 02/12/2018 901  200 - 950 cells/uL Final  . Eosinophils Absolute 02/12/2018 373  15 - 500 cells/uL Final  . Basophils Absolute 02/12/2018 55  0 - 200 cells/uL Final  . Neutrophils Relative % 02/12/2018 55.7  % Final  . Total Lymphocyte 02/12/2018 29.7  % Final  . Monocytes Relative 02/12/2018 9.9  % Final  . Eosinophils Relative 02/12/2018 4.1  % Final  . Basophils Relative 02/12/2018 0.6  % Final    Immunization History  Administered Date(s) Administered  . Influenza, High Dose Seasonal PF 02/17/2017  . Influenza,inj,Quad PF,6+ Mos 02/10/2014, 03/14/2015, 01/09/2016, 02/12/2018  . Influenza-Unspecified 02/12/2018  . Pneumococcal Conjugate-13 07/04/2014  . Pneumococcal Polysaccharide-23 12/26/2011  . Td 12/26/2011  . Tdap 12/26/2011  .  Zoster 02/10/2014    Past Medical History:  Diagnosis Date  . Allergy   . Asthma   . Diabetes mellitus type II, controlled (HCC)   . GERD (gastroesophageal reflux disease)    pt states he takes omeprazole but doesnt have GERD  . Hypertension   . Obesity   . Sleep apnea    uses CPAP   Past Surgical History:  Procedure Laterality Date  . CHOLECYSTECTOMY    . DENTAL SURGERY     Current Outpatient Medications on File Prior to Visit  Medication Sig Dispense Refill  . Albuterol Sulfate 108 (90 Base) MCG/ACT AEPB Inhale 1-2 puffs every 6 (six) hours  as needed into the lungs.    Marland Kitchen aspirin EC 81 MG tablet Take 81 mg daily by mouth.    . B Complex Vitamins (B COMPLEX 1 PO) Take by mouth.    . Calcium Carb-Cholecalciferol (CALCIUM 1000 + D PO) Take 1 tablet by mouth 2 (two) times daily.    . cetirizine (ZYRTEC) 10 MG tablet TAKE 1 TABLET BY MOUTH ONCE DAILY 30 tablet 11  . glucosamine-chondroitin 500-400 MG tablet Take 1 tablet 2 (two) times daily by mouth.    . losartan (COZAAR) 50 MG tablet TAKE 1 TABLET BY MOUTH ONCE DAILY 90 tablet 3  . metFORMIN (GLUCOPHAGE) 500 MG tablet TAKE 1 TABLET BY MOUTH TWICE DAILY WITH  A  MEAL. 180 tablet 1  . Multiple Vitamins-Minerals (MULTIVITAMIN WITH MINERALS) tablet Take 1 tablet daily by mouth.    . Omega-3 Fatty Acids (FISH OIL) 1000 MG CAPS Take 1 capsule by mouth daily.    . ranitidine (ZANTAC) 150 MG tablet Take 150 mg daily by mouth.    . SYMBICORT 160-4.5 MCG/ACT inhaler INHALE 2 PUFFS BY MOUTH TWICE DAILY 1 Inhaler 5  . zafirlukast (ACCOLATE) 20 MG tablet TAKE 1 TABLET BY MOUTH TWICE DAILY BEFORE A MEAL 180 tablet 1   Current Facility-Administered Medications on File Prior to Visit  Medication Dose Route Frequency Provider Last Rate Last Dose  . 0.9 %  sodium chloride infusion  500 mL Intravenous Once Iva Boop, MD       No Known Allergies Social History   Socioeconomic History  . Marital status: Married    Spouse name: Not on file   . Number of children: Not on file  . Years of education: Not on file  . Highest education level: Not on file  Occupational History  . Not on file  Social Needs  . Financial resource strain: Not on file  . Food insecurity:    Worry: Not on file    Inability: Not on file  . Transportation needs:    Medical: Not on file    Non-medical: Not on file  Tobacco Use  . Smoking status: Never Smoker  . Smokeless tobacco: Former Neurosurgeon    Types: Chew  Substance and Sexual Activity  . Alcohol use: Yes    Comment: occasional   . Drug use: No  . Sexual activity: Not on file  Lifestyle  . Physical activity:    Days per week: Not on file    Minutes per session: Not on file  . Stress: Not on file  Relationships  . Social connections:    Talks on phone: Not on file    Gets together: Not on file    Attends religious service: Not on file    Active member of club or organization: Not on file    Attends meetings of clubs or organizations: Not on file    Relationship status: Not on file  . Intimate partner violence:    Fear of current or ex partner: Not on file    Emotionally abused: Not on file    Physically abused: Not on file    Forced sexual activity: Not on file  Other Topics Concern  . Not on file  Social History Narrative  . Not on file      Review of Systems  All other systems reviewed and are negative.      Objective:   Physical Exam  Constitutional: He is oriented to person, place, and time. He appears well-developed  and well-nourished. No distress.  HENT:  Head: Normocephalic and atraumatic.  Right Ear: External ear normal.  Left Ear: External ear normal.  Nose: Nose normal.  Mouth/Throat: Oropharynx is clear and moist. No oropharyngeal exudate.  Eyes: Pupils are equal, round, and reactive to light. Conjunctivae and EOM are normal. Right eye exhibits no discharge. Left eye exhibits no discharge. No scleral icterus.  Neck: Normal range of motion. Neck supple. No JVD  present. No tracheal deviation present. No thyromegaly present.  Cardiovascular: Normal rate, regular rhythm, normal heart sounds and intact distal pulses. Exam reveals no gallop and no friction rub.  No murmur heard. Pulmonary/Chest: Effort normal and breath sounds normal. No stridor. No respiratory distress. He has no wheezes. He has no rales. He exhibits no tenderness.  Abdominal: Soft. Bowel sounds are normal. He exhibits no distension and no mass. There is no abdominal tenderness. There is no rebound and no guarding.  Genitourinary:    Rectum normal.  Prostate is enlarged. Prostate is not tender.  Musculoskeletal: Normal range of motion.        General: No edema.  Lymphadenopathy:    He has no cervical adenopathy.  Neurological: He is alert and oriented to person, place, and time. He has normal reflexes. No cranial nerve deficit. He exhibits normal muscle tone. Coordination normal.  Skin: Skin is warm. No rash noted. He is not diaphoretic. No erythema. No pallor.  Psychiatric: He has a normal mood and affect. His behavior is normal. Judgment and thought content normal.  Vitals reviewed.         Assessment & Plan:  Burning with urination - Plan: Urinalysis, Routine w reflex microscopic  Prostate cancer screening - Plan: PSA  Morbid obesity (HCC)  Routine general medical examination at a health care facility  Diet-controlled diabetes mellitus (HCC)  I believe his urinary issues are a sign of BPH.  Less likely would be overactive bladder.  We will try Flomax 0.4 mg p.o. nightly and see if his symptoms improve.  I will also screen for prostate cancer with PSA.  Urinalysis was normal.  I reviewed his lab work.  This is significant for hemoglobin A1c of 6.2.  Patient is not eating a low carbohydrate diet.  He remains overweight.  I recommended a low carbohydrate diet, regular aerobic exercise, and weight loss.  Blood pressure is elevated today however I will see how the patient  responds to Flomax prior to making any changes in his blood pressure medication as this can cause orthostatic dizziness.  Immunizations are up-to-date.  Colonoscopy is up-to-date.  Patient denies any problems with falls, memory loss, or depression

## 2018-05-05 LAB — PSA: PSA: 3 ng/mL (ref <=4.0)

## 2018-05-10 ENCOUNTER — Other Ambulatory Visit: Payer: Self-pay | Admitting: Family Medicine

## 2018-05-21 ENCOUNTER — Ambulatory Visit: Payer: Medicare Other | Admitting: Family Medicine

## 2018-05-21 ENCOUNTER — Encounter: Payer: Self-pay | Admitting: Family Medicine

## 2018-05-21 ENCOUNTER — Ambulatory Visit (INDEPENDENT_AMBULATORY_CARE_PROVIDER_SITE_OTHER): Payer: Medicare Other | Admitting: Family Medicine

## 2018-05-21 VITALS — BP 132/84 | HR 81 | Temp 97.6°F | Resp 16 | Ht 66.0 in | Wt 327.2 lb

## 2018-05-21 DIAGNOSIS — K1379 Other lesions of oral mucosa: Secondary | ICD-10-CM

## 2018-05-21 MED ORDER — LIDOCAINE VISCOUS HCL 2 % MT SOLN: 5.0000 mL | Freq: Four times a day (QID) | OROMUCOSAL | 0 refills | Status: DC | PRN

## 2018-05-21 NOTE — Patient Instructions (Signed)
Try biotene mouth wash, or other over the counter mouth washes that are gentle and have some numbing medicine.  You can try swishing with maalox and liquid benadryl.  Avoid acidic, spicy and salty food for a while to allow it to calm down.  Get you tooth fixed  Follow up if it doesn't get better.

## 2018-05-21 NOTE — Progress Notes (Signed)
Patient ID: William Howell, male    DOB: 05/31/1940, 78 y.o.   MRN: [id number]  PCP: Donita Brooks, MD  Chief Complaint  Patient presents with  . Mouth Lesions    Patient in with c/o white spots in mouth that are sore with acidic foods. Onset a few weeks ago.    Subjective:   William Howell is a 78 y.o. male, presents to clinic with CC of sores to mouth.  He has had some irritation to his left lower gums for a few weeks.  He broke a tooth in that area, he recently stopped dipping and instead started eating sunflower seeds and pistachios and this made the gums in the area sore and raw.  He then switched to some bubblegum that he chews but he says he continues to have irritation in that area of his mouth.   No fever, HA, swollen lymph nodes, rash, abd sx.    Patient Active Problem List   Diagnosis Date Noted  . Aortic valve regurgitation 04/21/2017  . Right knee pain 03/18/2017  . Other fatigue 08/26/2016  . RBBB 08/26/2016  . Acute bronchitis 04/11/2016  . Screen for colon cancer 02/19/2016  . Asthma, chronic 01/09/2016  . Dyspnea 12/12/2015  . Morbid obesity (HCC)   . Sleep apnea      Prior to Admission medications   Medication Sig Start Date End Date Taking? Authorizing Provider  Albuterol Sulfate 108 (90 Base) MCG/ACT AEPB Inhale 1-2 puffs into the lungs every 6 (six) hours as needed. 05/04/18  Yes Donita Brooks, MD  aspirin EC 81 MG tablet Take 81 mg daily by mouth.   Yes [provider]  B Complex Vitamins (B COMPLEX 1 PO) Take by mouth.   Yes [provider]  Calcium Carb-Cholecalciferol (CALCIUM 1000 + D PO) Take 1 tablet by mouth 2 (two) times daily.   Yes [provider]  cetirizine (ZYRTEC) 10 MG tablet TAKE 1 TABLET BY MOUTH ONCE DAILY 06/16/17  Yes Donita Brooks, MD  glucosamine-chondroitin 500-400 MG tablet Take 1 tablet 2 (two) times daily by mouth.   Yes [provider]  losartan (COZAAR) 50 MG tablet TAKE 1  TABLET BY MOUTH ONCE DAILY 12/16/17  Yes Donita Brooks, MD  metFORMIN (GLUCOPHAGE) 500 MG tablet TAKE 1 TABLET BY MOUTH TWICE DAILY WITH A MEAL 05/11/18  Yes Donita Brooks, MD  Multiple Vitamins-Minerals (MULTIVITAMIN WITH MINERALS) tablet Take 1 tablet daily by mouth.   Yes [provider]  Omega-3 Fatty Acids (FISH OIL) 1000 MG CAPS Take 1 capsule by mouth daily.   Yes [provider]  SYMBICORT 160-4.5 MCG/ACT inhaler INHALE 2 PUFFS BY MOUTH TWICE DAILY 11/11/17  Yes Lupita Leash, MD  tamsulosin (FLOMAX) 0.4 MG CAPS capsule Take 1 capsule (0.4 mg total) by mouth daily. 05/04/18  Yes Donita Brooks, MD  zafirlukast (ACCOLATE) 20 MG tablet TAKE 1 TABLET BY MOUTH TWICE DAILY BEFORE A MEAL 03/09/18  Yes Donita Brooks, MD     No Known Allergies   Family History  Problem Relation Age of Onset  . Heart disease Father   . Heart attack Father   . Heart attack Sister   . Colon cancer Neg Hx   . Colon polyps Neg Hx   . Esophageal cancer Neg Hx   . Rectal cancer Neg Hx   . Stomach cancer Neg Hx   . Pancreatic cancer Neg Hx   . Liver  cancer Neg Hx   . Prostate cancer Neg Hx      Social History   Socioeconomic History  . Marital status: Married    Spouse name: Not on file  . Number of children: Not on file  . Years of education: Not on file  . Highest education level: Not on file  Occupational History  . Not on file  Social Needs  . Financial resource strain: Not on file  . Food insecurity:    Worry: Not on file    Inability: Not on file  . Transportation needs:    Medical: Not on file    Non-medical: Not on file  Tobacco Use  . Smoking status: Never Smoker  . Smokeless tobacco: Former NeurosurgeonUser    Types: Chew  Substance and Sexual Activity  . Alcohol use: Yes    Comment: occasional   . Drug use: No  . Sexual activity: Not on file  Lifestyle  . Physical activity:    Days per week: Not on file    Minutes per session: Not on file  . Stress:  Not on file  Relationships  . Social connections:    Talks on phone: Not on file    Gets together: Not on file    Attends religious service: Not on file    Active member of club or organization: Not on file    Attends meetings of clubs or organizations: Not on file    Relationship status: Not on file  . Intimate partner violence:    Fear of current or ex partner: Not on file    Emotionally abused: Not on file    Physically abused: Not on file    Forced sexual activity: Not on file  Other Topics Concern  . Not on file  Social History Narrative  . Not on file     Review of Systems  Constitutional: Negative.   HENT: Negative.   Eyes: Negative.   Respiratory: Negative.   Cardiovascular: Negative.   Gastrointestinal: Negative.   Endocrine: Negative.   Genitourinary: Negative.   Musculoskeletal: Negative.   Skin: Negative.   Allergic/Immunologic: Negative.   Neurological: Negative.   Hematological: Negative.   Psychiatric/Behavioral: Negative.   All other systems reviewed and are negative.      Objective:    Vitals:   05/21/18 0829  BP: 132/84  Pulse: 81  Resp: 16  Temp: 97.6 F (36.4 C)  TempSrc: Oral  SpO2: 98%  Weight: (!) 327 lb 4 oz (148.4 kg)  Height: 5\' 6"  (1.676 m)      Physical Exam Vitals signs and nursing note reviewed.  Constitutional:      Appearance: He is well-developed.     Comments: Obese elderly male, well appearing  HENT:     Head: Normocephalic and atraumatic.     Jaw: There is normal jaw occlusion. No trismus, tenderness, swelling, pain on movement or malocclusion.     Salivary Glands: Right salivary gland is not diffusely enlarged. Left salivary gland is not diffusely enlarged or tender.     Nose: Nose normal.     Mouth/Throat:     Lips: Pink. No lesions.     Mouth: Mucous membranes are moist. No angioedema.     Dentition: Abnormal dentition. Gum lesions (left lower gum with 4 small 1-212mm white spots, not ulcerated, no erythema)  present.     Pharynx: Oropharynx is clear. Uvula midline.  Eyes:     General:  Right eye: No discharge.        Left eye: No discharge.     Conjunctiva/sclera: Conjunctivae normal.  Neck:     Trachea: No tracheal deviation.  Cardiovascular:     Rate and Rhythm: Normal rate and regular rhythm.  Pulmonary:     Effort: Pulmonary effort is normal. No respiratory distress.     Breath sounds: No stridor.  Musculoskeletal: Normal range of motion.  Skin:    General: Skin is warm and dry.     Findings: No rash.  Neurological:     Mental Status: He is alert.     Motor: No abnormal muscle tone.     Coordination: Coordination normal.  Psychiatric:        Behavior: Behavior normal.           Assessment & Plan:      ICD-10-CM   1. Mouth sores K13.79     F/up dentist - suspect from dental issues - broken teeth -also he has been chewing lots of nuts and seeds that are very salty and although he has had irritation for a long time he is still drinking a lot of coffee orange juice  He was encouraged to avoid all caustic, acidic and salty foods.    Urged to push fluids, trial of Biotene mouthwash or over-the-counter mouthwash for sores that have some antiseptic.  Give him this is lidocaine and encouraged him to try and swish it with some Maalox and liquid Benadryl if is not improving.  He was encouraged to follow-up with his dentist for evaluation if they do not improve after having some dental work done.  If the lesions are there for a long time he may need a biopsy and he was encouraged to contact us if his dentist does not do this he may need oral surgeon.  Danelle Berry, PA-C 05/21/18 8:56 AM

## 2018-06-01 ENCOUNTER — Ambulatory Visit: Payer: Medicare Other | Admitting: Internal Medicine

## 2018-06-01 ENCOUNTER — Encounter: Payer: Self-pay | Admitting: Internal Medicine

## 2018-06-01 VITALS — BP 138/90 | HR 85 | Ht 66.0 in | Wt 329.0 lb

## 2018-06-01 DIAGNOSIS — E118 Type 2 diabetes mellitus with unspecified complications: Secondary | ICD-10-CM

## 2018-06-01 DIAGNOSIS — E782 Mixed hyperlipidemia: Secondary | ICD-10-CM

## 2018-06-01 DIAGNOSIS — G4733 Obstructive sleep apnea (adult) (pediatric): Secondary | ICD-10-CM

## 2018-06-01 DIAGNOSIS — I10 Essential (primary) hypertension: Secondary | ICD-10-CM | POA: Diagnosis not present

## 2018-06-01 DIAGNOSIS — I351 Nonrheumatic aortic (valve) insufficiency: Secondary | ICD-10-CM | POA: Diagnosis not present

## 2018-06-01 DIAGNOSIS — I4510 Unspecified right bundle-branch block: Secondary | ICD-10-CM

## 2018-06-01 DIAGNOSIS — Z9989 Dependence on other enabling machines and devices: Secondary | ICD-10-CM

## 2018-06-01 NOTE — Progress Notes (Signed)
OFFICE CONSULT NOTE  Chief Complaint:  No new complaints  Primary Care Physician: Donita BrooksPickard, Warren T, MD  HPI:  William Howell is a 78 y.o. male who is being seen today for the evaluation of DOE and fatigue at the request of Dr. Kendrick FriesMcQuaid. Mr. William Howell has a history of morbid obesity, type 2 diabetes, hypertension, asthma and obstructive sleep apnea on CPAP. He reports at least 3 or 4 years of significant dyspnea on exertion. His weight is been fairly stable although at one time in the past he lost more than 50 pounds on a commercial weight loss diet but regained the weight. Recently he's been working with his pulmonologist for shortness of breath. He says in general is improved however he is concerned about possible underlying coronary disease. Certainly has multiple coronary risk factors and a family history of heart attack in his father who also had pulmonary disease and it may been related to using his inhaler. He also has a sister who died of heart attack. Mr. William Howell feels short of breath when exerting himself, particularly walking up stairs or doing some physical activities. He denies any chest pain, pressure, heaviness or associated symptoms.  10/07/2016  [name] returns today for follow-up. He underwent a nuclear stress test which was negative for ischemia but showed a fixed inferior defect. This could potentially be scar however suspect that likely bowel attenuation artifact given his obesity. The echocardiogram showed normal inferior wall motion in that territory without any evidence of scar. Echo did also showed normal systolic function, mild LVH, normal diastolic dysfunction and mild aortic insufficiency. I don't believe the degree of aortic insufficiency that he has would explain his shortness of breath. Rather think his symptoms are multifactorial including obesity, asthma and perhaps upper airway resistance / poor diaphragmatic excursion.  04/21/2017  [name] was seen  today in follow-up.  He denies any worsening shortness of breath or chest pain.  He is down a few pounds and has lost more weight but gained some back during the holidays.  He previously had lost about 60 pounds with commercial weight loss program, but gained the weight back.  He says he is not currently interested in a referral to weight loss program because he is busy managing to farms.  He understands he needs to continue to lose weight.  06/01/2018  [name] is seen today in follow-up.  His shortness of breath is stable.  Unfortunately weight is stable and excessive.  He is not been able to lose any additional weight.  He denies any chest pain.  He did have some mild aortic insufficiency on echo which does not seem to symptomatically change.  He reports compliance with CPAP but has not had it reassessed recently.  He is followed by pulmonary and may need to see a sleep specialist there to reassess his equipment.  He saw his PCP in January.  Labs in October showed total cholesterol 130, HDL 37, LDL 75 and triglycerides 93.  Hemoglobin A1c was 6.2.  PMHx:  Past Medical History:  Diagnosis Date  . Allergy   . Asthma   . Diabetes mellitus type II, controlled (HCC)   . GERD (gastroesophageal reflux disease)    pt states he takes omeprazole but doesnt have GERD  . Hypertension   . Obesity   . Sleep apnea    uses CPAP    Past Surgical History:  Procedure Laterality Date  . CHOLECYSTECTOMY    . DENTAL SURGERY  FAMHx:  Family History  Problem Relation Age of Onset  . Heart disease Father   . Heart attack Father   . Heart attack Sister   . Colon cancer Neg Hx   . Colon polyps Neg Hx   . Esophageal cancer Neg Hx   . Rectal cancer Neg Hx   . Stomach cancer Neg Hx   . Pancreatic cancer Neg Hx   . Liver cancer Neg Hx   . Prostate cancer Neg Hx     SOCHx:   reports that he has never smoked. He has quit using smokeless tobacco.  His smokeless tobacco use included chew. He  reports current alcohol use. He reports that he does not use drugs.  ALLERGIES:  No Known Allergies  ROS: Pertinent items noted in HPI and remainder of comprehensive ROS otherwise negative.  HOME MEDS: Current Outpatient Medications on File Prior to Visit  Medication Sig Dispense Refill  . Albuterol Sulfate (VENTOLIN HFA IN) Inhale into the lungs.    Marland Kitchen aspirin EC 81 MG tablet Take 81 mg daily by mouth.    . B Complex Vitamins (B COMPLEX 1 PO) Take by mouth.    . Calcium Carb-Cholecalciferol (CALCIUM 1000 + D PO) Take 1 tablet by mouth 2 (two) times daily.    . cetirizine (ZYRTEC) 10 MG tablet TAKE 1 TABLET BY MOUTH ONCE DAILY 30 tablet 11  . glucosamine-chondroitin 500-400 MG tablet Take 1 tablet 2 (two) times daily by mouth.    . losartan (COZAAR) 50 MG tablet TAKE 1 TABLET BY MOUTH ONCE DAILY 90 tablet 3  . metFORMIN (GLUCOPHAGE) 500 MG tablet TAKE 1 TABLET BY MOUTH TWICE DAILY WITH A MEAL 180 tablet 0  . Multiple Vitamins-Minerals (MULTIVITAMIN WITH MINERALS) tablet Take 1 tablet daily by mouth.    . Omega-3 Fatty Acids (FISH OIL) 1000 MG CAPS Take 1 capsule by mouth daily.    . SYMBICORT 160-4.5 MCG/ACT inhaler INHALE 2 PUFFS BY MOUTH TWICE DAILY 1 Inhaler 5  . tamsulosin (FLOMAX) 0.4 MG CAPS capsule Take 1 capsule (0.4 mg total) by mouth daily. 30 capsule 3  . zafirlukast (ACCOLATE) 20 MG tablet TAKE 1 TABLET BY MOUTH TWICE DAILY BEFORE A MEAL 180 tablet 1   Current Facility-Administered Medications on File Prior to Visit  Medication Dose Route Frequency Provider Last Rate Last Dose  . 0.9 %  sodium chloride infusion  500 mL Intravenous Once Iva Boop, MD        LABS/IMAGING: No results found for this or any previous visit (from the past 48 hour(s)). No results found.  LIPID PANEL:    Component Value Date/Time   CHOL 130 02/12/2018 0908   TRIG 93 02/12/2018 0908   HDL 37 (L) 02/12/2018 0908   CHOLHDL 3.5 02/12/2018 0908   VLDL 20 02/19/2016 0918   LDLCALC 75  02/12/2018 0908    WEIGHTS: Wt Readings from Last 3 Encounters:  06/01/18 (!) 329 lb (149.2 kg)  05/21/18 (!) 327 lb 4 oz (148.4 kg)  05/04/18 (!) 324 lb (147 kg)    VITALS: BP 138/90 (BP Location: Left Arm, Patient Position: Sitting, Cuff Size: Large)   Pulse 85   Ht 5\' 6"  (1.676 m)   Wt (!) 329 lb (149.2 kg)   BMI 53.10 kg/m   EXAM: General appearance: alert, no distress and morbidly obese Neck: no carotid bruit, no JVD and thyroid not enlarged, symmetric, no tenderness/mass/nodules Lungs: clear to auscultation bilaterally Heart: regular rate and rhythm, S1, S2  normal and diastolic murmur: early diastolic 2/6, blowing at 2nd right intercostal space Abdomen: soft, non-tender; bowel sounds normal; no masses,  no organomegaly Extremities: extremities normal, atraumatic, no cyanosis or edema Pulses: 2+ and symmetric Skin: Skin color, texture, turgor normal. No rashes or lesions Neurologic: Grossly normal Psych: Pleasant  EKG: Normal sinus rhythm 85, RBBB, inferior Q waves-personally reviewed  ASSESSMENT: 1. Stable dyspnea on exertion and fatigue - low risk stress test, fixed inferior attenuation artifact (09/2016) 2. LVEF 60-65%, mild AI, mild LVH (09/2016) 3. Morbid obesity 4. RBBB 5. Hypertension 6. Dyslipidemia 7. Asthma 8. OSA on CPAP  PLAN: 1.   Mr. William Pian has stable dyspnea on exertion and fatigue.  His EF is 60 to 65% 2018 with mild AI.  He has a stable right bundle branch block.  It peers that he has a fixed inferior defect which was thought to be attenuation artifact.  I again recommended referral to weight loss program however with his traveling it does not seem that he will be in town enough to be compliant with that.  No changes made to his medicines today.  Lan follow-up with me annually or sooner as necessary.  Chrystie Nose, MD, [facility], FACP  [place]  [facility]  Medical Director of the Advanced Lipid Disorders &  Cardiovascular Risk Reduction  Clinic Diplomate of the American Board of Clinical Lipidology Attending Cardiologist  Direct Dial: [phone]  Fax: [phone]  Website:  [email or web address] 06/01/2018, 8:45 AM

## 2018-06-01 NOTE — Patient Instructions (Signed)
Medication Instructions:  Your physician recommends that you continue on your current medications as directed. Please refer to the Current Medication list given to you today.  If you need a refill on your cardiac medications before your next appointment, please call your pharmacy.   Lab work: None ordered  Testing/Procedures: None ordered  Follow-Up: At [facility], you and your health needs are our priority.  As part of our continuing mission to provide you with exceptional heart care, we have created designated Provider Care Teams.  These Care Teams include your primary Cardiologist (physician) and Advanced Practice Providers (APPs -  Physician Assistants and Nurse Practitioners) who all work together to provide you with the care you need, when you need it. You will need a follow up appointment in 12 months.  Please call our office 2 months in advance to schedule this appointment.  You may see  Dr.Hilty or one of the following Advanced Practice Providers on your designated Care Team: Hao Meng, PA-C . Angela Duke, PA-C     

## 2018-06-07 ENCOUNTER — Other Ambulatory Visit: Payer: Self-pay | Admitting: Family Medicine

## 2018-06-10 ENCOUNTER — Other Ambulatory Visit: Payer: Self-pay | Admitting: Pulmonary Disease

## 2018-07-11 ENCOUNTER — Other Ambulatory Visit: Payer: Self-pay | Admitting: Family Medicine

## 2018-08-01 ENCOUNTER — Other Ambulatory Visit: Payer: Self-pay | Admitting: Family Medicine

## 2018-08-03 ENCOUNTER — Ambulatory Visit: Payer: Medicare Other | Admitting: Pulmonary Disease

## 2018-08-25 ENCOUNTER — Other Ambulatory Visit: Payer: Self-pay | Admitting: Family Medicine

## 2018-09-07 ENCOUNTER — Other Ambulatory Visit: Payer: Self-pay | Admitting: Family Medicine

## 2018-09-14 ENCOUNTER — Other Ambulatory Visit: Payer: Self-pay | Admitting: Family Medicine

## 2018-11-11 ENCOUNTER — Other Ambulatory Visit: Payer: Self-pay | Admitting: Family Medicine

## 2018-12-07 ENCOUNTER — Other Ambulatory Visit: Payer: Self-pay | Admitting: Pulmonary Disease

## 2018-12-08 DIAGNOSIS — G4731 Primary central sleep apnea: Secondary | ICD-10-CM | POA: Diagnosis not present

## 2018-12-08 DIAGNOSIS — G4730 Sleep apnea, unspecified: Secondary | ICD-10-CM | POA: Diagnosis not present

## 2018-12-20 ENCOUNTER — Other Ambulatory Visit: Payer: Self-pay | Admitting: Family Medicine

## 2019-01-04 ENCOUNTER — Encounter: Payer: Self-pay | Admitting: Pulmonary Disease

## 2019-01-04 ENCOUNTER — Ambulatory Visit: Payer: Medicare Other | Admitting: Pulmonary Disease

## 2019-01-04 ENCOUNTER — Other Ambulatory Visit: Payer: Self-pay

## 2019-01-04 VITALS — BP 138/72 | HR 80 | Ht 69.0 in | Wt 322.3 lb

## 2019-01-04 DIAGNOSIS — Z23 Encounter for immunization: Secondary | ICD-10-CM | POA: Diagnosis not present

## 2019-01-04 DIAGNOSIS — G4730 Sleep apnea, unspecified: Secondary | ICD-10-CM | POA: Diagnosis not present

## 2019-01-04 MED ORDER — BUDESONIDE-FORMOTEROL FUMARATE 160-4.5 MCG/ACT IN AERO: 2.0000 | INHALATION_SPRAY | Freq: Two times a day (BID) | RESPIRATORY_TRACT | 3 refills | Status: DC

## 2019-01-04 NOTE — Progress Notes (Signed)
Subjective:    Patient ID: William Howell, male    DOB: 1941-04-13, 78 y.o.   MRN: [id number]  Synopsis: Referred in August 2017 for evaluation of shortness of breath related to asthma.  He has asthma and obstructive sleep apnea.    HPI Patient with a history of asthma-on Symbicort-controlled symptoms History of obstructive sleep apnea-compliant with BiPAP treatment on 16/10  Machine is getting dated, not working as optimally  Denies any significant change in his health since last visit Compliant with Symbicort Rarely uses albuterol  Has chronic cramping in his legs Not exercising on a regular basis Some weight gain  Feels he is functioning fairly well Continues to use Symbicort regularly  Past Medical History:  Diagnosis Date  . Allergy   . Asthma   . Diabetes mellitus type II, controlled ([place])   . GERD (gastroesophageal reflux disease)    pt states he takes omeprazole but doesnt have GERD  . Hypertension   . Obesity   . Sleep apnea    uses CPAP       Review of Systems  Constitutional: Negative for chills, fatigue and fever.  HENT: Negative for postnasal drip.   Respiratory: Negative for cough, shortness of breath and wheezing.   Cardiovascular: Negative for chest pain, palpitations and leg swelling.  Musculoskeletal: Positive for arthralgias. Negative for joint swelling.  Skin: Negative.   Psychiatric/Behavioral: Negative.        Objective:   Physical Exam  Constitutional: He is oriented to person, place, and time. No distress.  Obese  HENT:  Head: Normocephalic and atraumatic.  Eyes: Pupils are equal, round, and reactive to light. Right eye exhibits no discharge.  Neck: Normal range of motion. Neck supple. No JVD present. No tracheal deviation present. No thyromegaly present.  Cardiovascular: Normal rate and regular rhythm.  Pulmonary/Chest: Effort normal and breath sounds normal. No stridor. No respiratory distress. He has no wheezes. He has no  rales.  Abdominal: Soft. Bowel sounds are normal. He exhibits no distension. There is no abdominal tenderness.  Musculoskeletal: Normal range of motion.        General: No edema.  Neurological: He is alert and oriented to person, place, and time. No cranial nerve deficit.  Skin: Skin is warm and dry. He is not diaphoretic. No erythema.    Vitals:   01/04/19 0910  BP: 138/72  Pulse: 80  SpO2: 99%  Weight: (!) 322 lb 4.8 oz (146.2 kg)  Height: 5\' 9"  (1.753 m)    Records from his December 2017 visit with pulmonary interstitial practitioner reviewed were he had bronchitis and was treated with prednisone and Augmentin.  PFT: September 2017 lung function testing ratio 80%, FEV1 2.28 L 77% predicted, FVC 2.86 L 70% predicted, total lung capacity 5.07 L 74% predicted, DLCO 26.92 86% predicted  Labs: August 2017 proBNP normal August 2017 chemistry with slightly elevated bicarbonate, hemoglobin 15.0   Imaging: October 2017 high-resolution CT chest images personally reviewed showing air-trapping consistent with small airways disease, some mild scattered patchy groundglass but nothing to suggest underlying interstitial lung disease. 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  Assessment & Plan:  Obstructive sleep apnea-on BiPAP 16/10  Asthma-moderate persistent-stable symptoms  Morbid obesity  Encouraged to continue exercising on a regular basis  Continue using Symbicort  We will schedule him for home sleep study His BiPAP machine is dated  I will see him back in the office in about 3 months Encouraged to call if any significant  concerns  We will administer a flu shot today

## 2019-01-04 NOTE — Addendum Note (Signed)
Addended by: Elton Sin on: 01/04/2019 09:55 AM   Modules accepted: Orders

## 2019-01-04 NOTE — Patient Instructions (Signed)
Obstructive sleep apnea-on BiPAP Asthma-well-controlled on Symbicort  Home sleep study Flu shot Symbicort renewal  I will see you back in the office in about 3 months Call with any significant concerns

## 2019-01-08 ENCOUNTER — Telehealth: Payer: Self-pay | Admitting: Pulmonary Disease

## 2019-01-08 NOTE — Telephone Encounter (Signed)
Pt was returning my call.  I called pt back & scheduled HST for 10/9 @ 2:30.  Pt's wife, Malachy Mood will pick up & return the machine.  Nothing further needed at this time.

## 2019-01-16 ENCOUNTER — Emergency Department
Admission: EM | Admit: 2019-01-16 | Discharge: 2019-01-16 | Disposition: A | Discharge status: Home / Self Care | Payer: Medicare Other | Attending: Emergency Medicine | Admitting: Emergency Medicine

## 2019-01-16 ENCOUNTER — Encounter: Payer: Self-pay | Admitting: Emergency Medicine

## 2019-01-16 ENCOUNTER — Emergency Department: Payer: Medicare Other

## 2019-01-16 ENCOUNTER — Other Ambulatory Visit: Payer: Self-pay

## 2019-01-16 DIAGNOSIS — J45909 Unspecified asthma, uncomplicated: Secondary | ICD-10-CM | POA: Insufficient documentation

## 2019-01-16 DIAGNOSIS — E119 Type 2 diabetes mellitus without complications: Secondary | ICD-10-CM | POA: Diagnosis not present

## 2019-01-16 DIAGNOSIS — I10 Essential (primary) hypertension: Secondary | ICD-10-CM | POA: Diagnosis not present

## 2019-01-16 DIAGNOSIS — N3090 Cystitis, unspecified without hematuria: Secondary | ICD-10-CM | POA: Insufficient documentation

## 2019-01-16 DIAGNOSIS — Z7982 Long term (current) use of aspirin: Secondary | ICD-10-CM | POA: Diagnosis not present

## 2019-01-16 DIAGNOSIS — N200 Calculus of kidney: Secondary | ICD-10-CM | POA: Diagnosis not present

## 2019-01-16 DIAGNOSIS — Z7984 Long term (current) use of oral hypoglycemic drugs: Secondary | ICD-10-CM | POA: Insufficient documentation

## 2019-01-16 DIAGNOSIS — R300 Dysuria: Secondary | ICD-10-CM | POA: Diagnosis present

## 2019-01-16 DIAGNOSIS — Z79899 Other long term (current) drug therapy: Secondary | ICD-10-CM | POA: Diagnosis not present

## 2019-01-16 LAB — CBC WITH DIFFERENTIAL/PLATELET
Abs Immature Granulocytes: 0.14 10*3/uL — ABNORMAL HIGH (ref 0.00–0.07)
Basophils Absolute: 0.1 10*3/uL (ref 0.0–0.1)
Basophils Relative: 0 %
Eosinophils Absolute: 0.1 10*3/uL (ref 0.0–0.5)
Eosinophils Relative: 1 %
HCT: 39.6 % (ref 39.0–52.0)
Hemoglobin: 13.2 g/dL (ref 13.0–17.0)
Immature Granulocytes: 1 %
Lymphocytes Relative: 8 %
Lymphs Abs: 2 10*3/uL (ref 0.7–4.0)
MCH: 30 pg (ref 26.0–34.0)
MCHC: 33.3 g/dL (ref 30.0–36.0)
MCV: 90 fL (ref 80.0–100.0)
Monocytes Absolute: 2.3 10*3/uL — ABNORMAL HIGH (ref 0.1–1.0)
Monocytes Relative: 9 %
Neutro Abs: 21.5 10*3/uL — ABNORMAL HIGH (ref 1.7–7.7)
Neutrophils Relative %: 81 %
Platelets: 274 10*3/uL (ref 150–400)
RBC: 4.4 MIL/uL (ref 4.22–5.81)
RDW: 13.4 % (ref 11.5–15.5)
WBC: 26.1 10*3/uL — ABNORMAL HIGH (ref 4.0–10.5)
nRBC: 0 % (ref 0.0–0.2)

## 2019-01-16 LAB — URINALYSIS, COMPLETE (UACMP) WITH MICROSCOPIC
Bacteria, UA: NONE SEEN
RBC / HPF: >50 RBC/hpf — ABNORMAL HIGH (ref 0–5)
Specific Gravity, Urine: 1.017 (ref 1.005–1.030)
WBC, UA: >50 WBC/hpf — ABNORMAL HIGH (ref 0–5)

## 2019-01-16 LAB — COMPREHENSIVE METABOLIC PANEL
ALT: 22 U/L (ref 0–44)
AST: 18 U/L (ref 15–41)
Albumin: 3.8 g/dL (ref 3.5–5.0)
Alkaline Phosphatase: 72 U/L (ref 38–126)
Anion gap: 9 (ref 5–15)
BUN: 15 mg/dL (ref 8–23)
CO2: 25 mmol/L (ref 22–32)
Calcium: 8.8 mg/dL — ABNORMAL LOW (ref 8.9–10.3)
Chloride: 101 mmol/L (ref 98–111)
Creatinine, Ser: 1.31 mg/dL — ABNORMAL HIGH (ref 0.61–1.24)
GFR calc Af Amer: >60 mL/min (ref >60)
GFR calc non Af Amer: 52 mL/min — ABNORMAL LOW (ref >60)
Glucose, Bld: 134 mg/dL — ABNORMAL HIGH (ref 70–99)
Potassium: 3.6 mmol/L (ref 3.5–5.1)
Sodium: 135 mmol/L (ref 135–145)
Total Bilirubin: 1.1 mg/dL (ref 0.3–1.2)
Total Protein: 7.1 g/dL (ref 6.5–8.1)

## 2019-01-16 LAB — LACTIC ACID, PLASMA: Lactic Acid, Venous: 0.9 mmol/L (ref 0.5–1.9)

## 2019-01-16 MED ORDER — LEVOFLOXACIN IN D5W 750 MG/150ML IV SOLN
750.0000 mg | Freq: Once | INTRAVENOUS | Status: AC
  Administered 2019-01-16: 19:00:00 750 mg via INTRAVENOUS
  Filled 2019-01-16: qty 150

## 2019-01-16 MED ORDER — LEVOFLOXACIN 750 MG PO TABS: 750.0000 mg | ORAL_TABLET | Freq: Every day | ORAL | 0 refills | Status: AC

## 2019-01-16 NOTE — ED Provider Notes (Signed)
[facility] Emergency Department Provider Note  ____________________________________________  Time seen: Approximately 8:13 PM  I have reviewed the triage vital signs and the nursing notes.   HISTORY  Chief Complaint Dysuria    HPI William Howell is a 78 y.o. male presents to the emergency department with dysuria and increased urinary frequency for the past 1 to 2 days.  Patient states that he has a history of urinary tract infections and typically experiences an episode of cystitis once a year.  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  He denies hematuria, fever, low back pain or vomiting.  He has been taking Azo at home.        Past Medical History:  Diagnosis Date  . Allergy   . Asthma   . Diabetes mellitus type II, controlled ([place])   . GERD (gastroesophageal reflux disease)    pt states he takes omeprazole but doesnt have GERD  . Hypertension   . Obesity   . Sleep apnea    uses CPAP    Patient Active Problem List   Diagnosis Date Noted  . Aortic valve regurgitation 04/21/2017  . Right knee pain 03/18/2017  . Other fatigue 08/26/2016  . RBBB 08/26/2016  . Acute bronchitis 04/11/2016  . Screen for colon cancer 02/19/2016  . Asthma, chronic 01/09/2016  . Dyspnea 12/12/2015  . Morbid obesity ([place])   . Sleep apnea     Past Surgical History:  Procedure Laterality Date  . CHOLECYSTECTOMY    . DENTAL SURGERY      Prior to Admission medications   Medication Sig Start Date End Date Taking? Authorizing Provider  Albuterol Sulfate (VENTOLIN HFA IN) Inhale into the lungs.    [provider]  aspirin EC 81 MG tablet Take 81 mg daily by mouth.    [provider]  B Complex Vitamins (B COMPLEX 1 PO) Take by mouth.    [provider]  budesonide-formoterol (SYMBICORT) 160-4.5 MCG/ACT inhaler Inhale 2 puffs into the  lungs 2 (two) times daily. 01/04/19   Laurin Coder, MD  Calcium Carb-Cholecalciferol (CALCIUM 1000 + D PO) Take 1 tablet by mouth 2 (two) times daily.    [provider]  cetirizine (ZYRTEC) 10 MG tablet Take 1 tablet by mouth once daily 07/13/18   [name], MD  glucosamine-chondroitin 500-400 MG tablet Take 1 tablet 2 (two) times daily by mouth.    [provider]  levofloxacin (LEVAQUIN) 750 MG tablet Take 1 tablet (750 mg total) by mouth daily for 5 days. 01/16/19 01/21/19  Lannie Fields, PA-C  losartan (COZAAR) 50 MG tablet Take 1 tablet by mouth once daily 12/22/18   [name], MD  metFORMIN (GLUCOPHAGE) 500 MG tablet TAKE 1 TABLET BY MOUTH TWICE DAILY WITH A MEAL 11/11/18   [name], MD  Multiple Vitamins-Minerals (MULTIVITAMIN WITH MINERALS) tablet Take 1 tablet daily by mouth.    [provider]  Omega-3 Fatty Acids (FISH OIL) 1000 MG CAPS Take 1 capsule by mouth daily.    [provider]  omeprazole (PRILOSEC) 40 MG capsule Take 1 capsule by mouth once daily 09/08/18   [name], MD  tamsulosin [facility]) 0.4 MG CAPS capsule Take 1 capsule by mouth once daily 08/25/18   [name], MD  zafirlukast (ACCOLATE) 20 MG tablet TAKE 1 TABLET BY MOUTH TWICE DAILY BEFORE  A MEAL 12/22/18   Donita BrooksPickard, Warren T, MD    Allergies Patient has no known allergies.  Family History  Problem Relation Age of Onset  . Heart disease Father   . Heart attack Father   . Heart attack Sister   . Colon cancer Neg Hx   . Colon polyps Neg Hx   . Esophageal cancer Neg Hx   . Rectal cancer Neg Hx   . Stomach cancer Neg Hx   . Pancreatic cancer Neg Hx   . Liver cancer Neg Hx   . Prostate cancer Neg Hx     Social History Social History   Tobacco Use  . Smoking status: Never Smoker  . Smokeless tobacco: Former NeurosurgeonUser    Types: Chew  Substance Use Topics  . Alcohol use: Yes    Comment: occasional   . Drug use: No     Review of  Systems  Constitutional: No fever/chills Eyes: No visual changes. No discharge ENT: No upper respiratory complaints. Cardiovascular: no chest pain. Respiratory: no cough. No SOB. Gastrointestinal: No abdominal pain.  No nausea, no vomiting.  No diarrhea.  No constipation. Genitourinary: Patient has dysuria and increased urinary frequency. Musculoskeletal: Negative for musculoskeletal pain. Skin: Negative for rash, abrasions, lacerations, ecchymosis. Neurological: Negative for headaches, focal weakness or numbness.   ____________________________________________   PHYSICAL EXAM:  VITAL SIGNS: ED Triage Vitals  Enc Vitals Group     BP 01/16/19 1532 (!) 129/51     Pulse Rate 01/16/19 1532 (!) 101     Resp 01/16/19 1532 18     Temp 01/16/19 1532 97.7 F (36.5 C)     Temp Source 01/16/19 1532 Oral     SpO2 01/16/19 1532 95 %     Weight 01/16/19 1540 (!) 320 lb (145.2 kg)     Height 01/16/19 1540 5\' 8"  (1.727 m)     Head Circumference --      Peak Flow --      Pain Score 01/16/19 1540 0     Pain Loc --      Pain Edu? --      Excl. in GC? --      Constitutional: Alert and oriented. Well appearing and in no acute distress. Eyes: Conjunctivae are normal. PERRL. EOMI. Head: Atraumatic. Cardiovascular: Normal rate, regular rhythm. Normal S1 and S2.  Good peripheral circulation. Respiratory: Normal respiratory effort without tachypnea or retractions. Lungs CTAB. Good air entry to the bases with no decreased or absent breath sounds. Gastrointestinal: Bowel sounds 4 quadrants. Soft and nontender to palpation. No guarding or rigidity. No palpable masses. No distention. No CVA tenderness. Musculoskeletal: Full range of motion to all extremities. No gross deformities appreciated. Neurologic:  Normal speech and language. No gross focal neurologic deficits are appreciated.  Skin:  Skin is warm, dry and intact. No rash noted. Psychiatric: Mood and affect are normal. Speech and behavior  are normal. Patient exhibits appropriate insight and judgement.   ____________________________________________   LABS (all labs ordered are listed, but only abnormal results are displayed)  Labs Reviewed  URINALYSIS, COMPLETE (UACMP) WITH MICROSCOPIC - Abnormal; Notable for the following components:      Result Value   Color, Urine ORANGE (*)    APPearance CLOUDY (*)    Glucose, UA   (*)    Value: TEST NOT REPORTED DUE TO COLOR INTERFERENCE OF URINE PIGMENT   Hgb urine dipstick   (*)    Value: TEST NOT REPORTED DUE TO COLOR INTERFERENCE OF  URINE PIGMENT   Bilirubin Urine   (*)    Value: TEST NOT REPORTED DUE TO COLOR INTERFERENCE OF URINE PIGMENT   Ketones, ur   (*)    Value: TEST NOT REPORTED DUE TO COLOR INTERFERENCE OF URINE PIGMENT   Protein, ur   (*)    Value: TEST NOT REPORTED DUE TO COLOR INTERFERENCE OF URINE PIGMENT   Nitrite   (*)    Value: TEST NOT REPORTED DUE TO COLOR INTERFERENCE OF URINE PIGMENT   Leukocytes,Ua   (*)    Value: TEST NOT REPORTED DUE TO COLOR INTERFERENCE OF URINE PIGMENT   RBC / HPF >50 (*)    WBC, UA >50 (*)    All other components within normal limits  CBC WITH DIFFERENTIAL/PLATELET - Abnormal; Notable for the following components:   WBC 26.1 (*)    Neutro Abs 21.5 (*)    Monocytes Absolute 2.3 (*)    Abs Immature Granulocytes 0.14 (*)    All other components within normal limits  COMPREHENSIVE METABOLIC PANEL - Abnormal; Notable for the following components:   Glucose, Bld 134 (*)    Creatinine, Ser 1.31 (*)    Calcium 8.8 (*)    GFR calc non Af Amer 52 (*)    All other components within normal limits  URINE CULTURE  LACTIC ACID, PLASMA  LACTIC ACID, PLASMA   ____________________________________________  EKG   ____________________________________________  RADIOLOGY I personally viewed and evaluated these images as part of my medical decision making, as well as reviewing the written report by the radiologist.  Ct Renal Stone  Study  Result Date: 01/16/2019 CLINICAL DATA:  78 year old male with history of flank pain. Suspected nephrolithiasis. Urinary frequency and urgency since yesterday. EXAM: CT ABDOMEN AND PELVIS WITHOUT CONTRAST TECHNIQUE: Multidetector CT imaging of the abdomen and pelvis was performed following the standard protocol without IV contrast. COMPARISON:  No priors. FINDINGS: Lower chest: Aortic atherosclerosis. Hepatobiliary: Diffuse low attenuation throughout the hepatic parenchyma, indicative of hepatic steatosis. No definite cystic or solid hepatic lesions are confidently identified on today's noncontrast CT examination. Status post cholecystectomy. Pancreas: No definite pancreatic mass or peripancreatic fluid collections or inflammatory changes are noted on today's noncontrast CT examination. Spleen: Unremarkable. Adrenals/Urinary Tract: Nonobstructive calculi within the collecting systems of both kidneys measuring up to 5 mm in the lower pole collecting system of the right kidney. No additional calculi are noted along the course of either ureter or within the lumen of the urinary bladder. No hydroureteronephrosis. Unenhanced appearance of the kidneys and bilateral adrenal glands is otherwise normal. Urinary bladder is normal in appearance. Stomach/Bowel: Normal appearance of the stomach. No pathologic dilatation of small bowel or colon. Numerous colonic diverticulae are noted, particularly in the descending colon and sigmoid colon, without surrounding inflammatory changes to suggest an acute diverticulitis at this time. Normal appendix. Vascular/Lymphatic: Aortic atherosclerosis. No lymphadenopathy noted in the abdomen or pelvis. Reproductive: Prostate gland and seminal vesicles are unremarkable in appearance. Other: No significant volume of ascites.  No pneumoperitoneum. Musculoskeletal: There are no aggressive appearing lytic or blastic lesions noted in the visualized portions of the skeleton. IMPRESSION: 1.  Numerous nonobstructive calculi are noted within the collecting systems of both kidneys measuring up to 5 mm in the lower pole collecting system of the right kidney. No ureteral stones or findings of urinary tract obstruction are noted at this time. 2. Severe colonic diverticulosis without evidence of acute diverticulitis at this time. 3. Severe hepatic steatosis. 4. Aortic atherosclerosis. Electronically  Signed   By: Trudie Reed M.D.   On: 01/16/2019 18:04    ____________________________________________    PROCEDURES  Procedure(s) performed:    Procedures    Medications  levofloxacin (LEVAQUIN) IVPB 750 mg (750 mg Intravenous New Bag/Given 01/16/19 1841)     ____________________________________________   INITIAL IMPRESSION / ASSESSMENT AND PLAN / ED COURSE  Pertinent labs & imaging results that were available during my care of the patient were reviewed by me and considered in my medical decision making (see chart for details).  Review of the [place] CSRS was performed in accordance of the [facility] prior to dispensing any controlled drugs.         Assessment and plan Cystitis 79 year old male presents to the emergency department with dysuria for the past 2 days.   Patient was mildly tachycardic at triage but vital signs were otherwise reassuring.  On physical exam, patient had no CVA tenderness or suprapubic tenderness to palpation.  Basic labs were obtained in the emergency department and significant leukocytosis was identified on CBC.  Patient denies use of prednisone but reports that he was recently placed on Pulmicort.  CMP was reassuring.  Urinalysis was skewed due to use of Azo but red blood cells and white blood cells were greater than 50.  Awaiting urine culture.  CT renal stone study did not reveal evidence of obstructing stone.  Patient was given IV Levaquin in the emergency department and discharged with Levaquin.  Strict return precautions were given to return to the  emergency department with worsening dysuria, fever, vomiting or weakness.  He voiced understanding and is accompanied by his wife who is primary caretaker.      ____________________________________________  FINAL CLINICAL IMPRESSION(S) / ED DIAGNOSES  Final diagnoses:  Cystitis      NEW MEDICATIONS STARTED DURING THIS VISIT:  ED Discharge Orders         Ordered    levofloxacin (LEVAQUIN) 750 MG tablet  Daily     01/16/19 2026              This chart was dictated using voice recognition software/Dragon. Despite best efforts to proofread, errors can occur which can change the meaning. Any change was purely unintentional.    William Howell 01/16/19 2028    Sharman Cheek, MD 01/16/19 2342

## 2019-01-16 NOTE — Discharge Instructions (Signed)
Take Levaquin once a day for 5 days.

## 2019-01-16 NOTE — ED Triage Notes (Signed)
Urinary frequency and urgency since yesterday. Denies flank pain. Denies fevers.

## 2019-01-19 LAB — URINE CULTURE: Culture: >=100000 — AB

## 2019-01-22 ENCOUNTER — Other Ambulatory Visit: Payer: Self-pay

## 2019-01-22 ENCOUNTER — Ambulatory Visit: Payer: Medicare Other

## 2019-01-22 DIAGNOSIS — G4730 Sleep apnea, unspecified: Secondary | ICD-10-CM

## 2019-01-23 DIAGNOSIS — G4733 Obstructive sleep apnea (adult) (pediatric): Secondary | ICD-10-CM | POA: Diagnosis not present

## 2019-01-25 DIAGNOSIS — H524 Presbyopia: Secondary | ICD-10-CM | POA: Diagnosis not present

## 2019-01-25 LAB — HM DIABETES EYE EXAM

## 2019-01-29 DIAGNOSIS — G4733 Obstructive sleep apnea (adult) (pediatric): Secondary | ICD-10-CM | POA: Diagnosis not present

## 2019-02-01 ENCOUNTER — Telehealth: Payer: Self-pay | Admitting: Pulmonary Disease

## 2019-02-01 DIAGNOSIS — G4730 Sleep apnea, unspecified: Secondary | ICD-10-CM

## 2019-02-01 NOTE — Telephone Encounter (Signed)
Dr. Ander Slade has reviewed the home sleep test this showed moderate sleep apnea, patient is currently on bipap treatment.   Recommendations   Auto titrate Bipap with pressure settings of -Lo pressure of 4, high pressure of 20, with pressure support of 4.  Weight loss measures .   Advise against driving while sleepy & against medication with sedative side effects.    Make appointment for 3 months for compliance with download with Dr. Ander Slade.  Patient scheduled 04/05/19 at 0915.  Patient aware of about results and recommendations.

## 2019-02-17 ENCOUNTER — Encounter: Payer: Self-pay | Admitting: Family Medicine

## 2019-03-06 ENCOUNTER — Other Ambulatory Visit: Payer: Self-pay | Admitting: Family Medicine

## 2019-03-21 ENCOUNTER — Other Ambulatory Visit: Payer: Self-pay | Admitting: Family Medicine

## 2019-03-28 ENCOUNTER — Other Ambulatory Visit: Payer: Self-pay | Admitting: Family Medicine

## 2019-04-05 ENCOUNTER — Ambulatory Visit: Payer: Medicare Other | Admitting: Pulmonary Disease

## 2019-04-05 ENCOUNTER — Telehealth: Payer: Self-pay | Admitting: Pulmonary Disease

## 2019-04-05 ENCOUNTER — Encounter: Payer: Self-pay | Admitting: Pulmonary Disease

## 2019-04-05 ENCOUNTER — Other Ambulatory Visit: Payer: Self-pay

## 2019-04-05 VITALS — BP 150/78 | HR 83 | Ht 69.0 in | Wt 317.8 lb

## 2019-04-05 DIAGNOSIS — G4733 Obstructive sleep apnea (adult) (pediatric): Secondary | ICD-10-CM

## 2019-04-05 DIAGNOSIS — G4731 Primary central sleep apnea: Secondary | ICD-10-CM

## 2019-04-05 DIAGNOSIS — Z9989 Dependence on other enabling machines and devices: Secondary | ICD-10-CM | POA: Diagnosis not present

## 2019-04-05 NOTE — Telephone Encounter (Signed)
Dr Ander Slade, Please advise.  [facility] states that per insurance diagnosis for the BiPAP ST needs to be revised to either Central or Complex Sleep Apnea.  Please advise if this may be done.  Thank you.

## 2019-04-05 NOTE — Telephone Encounter (Signed)
Lemar Livings, Allyne Gee, [place]; Jabier Mutton, there are several issues with this referral:  1. The patient's central events from his previous sleep study are not greater than 40% of the total events. This is based on the titration study from 2013. He had 22 obstructive apneas, 55 central apneas and 28 mixed apneas. He had 93 obstructive hypopneas. Total events=198. 55 Central =28%  So, he does not qualify based on the study and [facility] may not cover new equipment (I do not believe that he had this insurance when he received his current equipment).  2. The DX on the Rx is OSA, which is not a proper Dx for a BIPAP ST...it is either Central or Complex Sleep Apnea   So, the Dr. can order a new titration starting with CPAP, failing that and failing BIPAP and being titrated to BIPAP ST, BUT...central events have to be >50% of the total respiratory events OR the Dr can order can order a standard BIPAP.   Please advise how we are to proceed.   Thank you  Sonia Baller

## 2019-04-05 NOTE — Telephone Encounter (Signed)
Loni Beckwith, Drema Halon, Allyne Gee, [place], I have spoken to my management team on this and they said to proceed with the replacement, with the documentation that we have. However, I still need the DX on the RX revised to reflect either complex or central sleep apnea.  Let me know when this has been done and I will pull the order.  Thank you  Sonia Baller

## 2019-04-05 NOTE — Progress Notes (Signed)
Subjective:    Patient ID: William Howell, male    DOB: 1941/01/30, 78 y.o.   MRN: [id number]  Synopsis: Referred in August 2017 for evaluation of shortness of breath related to asthma.  He has asthma and obstructive sleep apnea. He recently had a repeat sleep study showing severe obstructive sleep apnea   HPI Patient with a history of asthma-on Symbicort-controlled symptoms History of obstructive sleep apnea-compliant with BiPAP treatment on 16/10-on BiPAP ST He has some problems with the mask, feels it may be pressure related  Repeat sleep study shows severe obstructive sleep apnea  Machine is getting dated, not working as optimally  Denies any significant change in his health since last visit Compliant with Symbicort Rarely uses albuterol  Has chronic cramping in his legs Not exercising on a regular basis Some weight gain  Feels he is functioning fairly well Continues to use Symbicort regularly  Past Medical History:  Diagnosis Date  . Allergy   . Asthma   . Diabetes mellitus type II, controlled ([facility])   . GERD (gastroesophageal reflux disease)    pt states he takes omeprazole but doesnt have GERD  . Hypertension   . Obesity   . Sleep apnea    uses CPAP   Family History  Problem Relation Age of Onset  . Heart disease Father   . Heart attack Father   . Heart attack Sister   . Colon cancer Neg Hx   . Colon polyps Neg Hx   . Esophageal cancer Neg Hx   . Rectal cancer Neg Hx   . Stomach cancer Neg Hx   . Pancreatic cancer Neg Hx   . Liver cancer Neg Hx   . Prostate cancer Neg Hx    Social History   Socioeconomic History  . Marital status: Married    Spouse name: Not on file  . Number of children: Not on file  . Years of education: Not on file  . Highest education level: Not on file  Occupational History  . Not on file  Tobacco Use  . Smoking status: Never Smoker  . Smokeless tobacco: Former Neurosurgeon    Types: Chew  Substance and Sexual Activity  .  Alcohol use: Yes    Comment: occasional   . Drug use: No  . Sexual activity: Not on file  Other Topics Concern  . Not on file  Social History Narrative  . Not on file   Social Determinants of Health   Financial Resource Strain:   . Difficulty of Paying Living Expenses: Not on file  Food Insecurity:   . Worried About Programme researcher, broadcasting/film/video in the Last Year: Not on file  . Ran Out of Food in the Last Year: Not on file  Transportation Needs:   . Lack of Transportation (Medical): Not on file  . Lack of Transportation (Non-Medical): Not on file  Physical Activity:   . Days of Exercise per Week: Not on file  . Minutes of Exercise per Session: Not on file  Stress:   . Feeling of Stress : Not on file  Social Connections:   . Frequency of Communication with Friends and Family: Not on file  . Frequency of Social Gatherings with Friends and Family: Not on file  . Attends Religious Services: Not on file  . Active Member of Clubs or Organizations: Not on file  . Attends Banker Meetings: Not on file  . Marital Status: Not on file  Intimate Partner Violence:   .  Fear of Current or Ex-Partner: Not on file  . Emotionally Abused: Not on file  . Physically Abused: Not on file  . Sexually Abused: Not on file   Review of Systems  Constitutional: Negative for chills, fatigue and fever.  HENT: Negative for postnasal drip.   Respiratory: Negative for cough, shortness of breath and wheezing.   Cardiovascular: Negative for chest pain, palpitations and leg swelling.  Musculoskeletal: Positive for arthralgias. Negative for joint swelling.  Skin: Negative.   Psychiatric/Behavioral: Negative.      Objective:   Physical Exam  Constitutional: He is oriented to person, place, and time. No distress.  Obese  HENT:  Head: Normocephalic and atraumatic.  Eyes: Pupils are equal, round, and reactive to light. Right eye exhibits no discharge.  Neck: No JVD present. No tracheal deviation  present. No thyromegaly present.  Cardiovascular: Normal rate and regular rhythm.  Pulmonary/Chest: Effort normal and breath sounds normal. No stridor. No respiratory distress. He has no wheezes. He has no rales.  Musculoskeletal:     Cervical back: Normal range of motion and neck supple.  Neurological: He is alert and oriented to person, place, and time.  Skin: He is not diaphoretic.    Vitals:   04/05/19 0917  BP: (!) 150/78  Pulse: 83  SpO2: 95%  Weight: (!) 317 lb 12.8 oz (144.2 kg)  Height: 5\' 9"  (1.753 m)    Records from his December 2017 visit with pulmonary interstitial practitioner reviewed were he had bronchitis and was treated with prednisone and Augmentin.  PFT: September 2017 lung function testing ratio 80%, FEV1 2.28 L 77% predicted, FVC 2.86 L 70% predicted, total lung capacity 5.07 L 74% predicted, DLCO 26.92 86% predicted  Labs: August 2017 proBNP normal August 2017 chemistry with slightly elevated bicarbonate, hemoglobin 15.0   Imaging: October 2017 high-resolution CT chest images personally reviewed showing air-trapping consistent with small airways disease, some mild scattered patchy groundglass but nothing to suggest underlying interstitial lung disease. 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   Home sleep study revealed severe obstructive sleep apnea Assessment & Plan:  Obstructive sleep apnea-on BiPAP 16/10, BiPAP ST  Asthma-moderate persistent-stable symptoms-controlled symptoms  Morbid obesity-difficulty with his knee, weight has remained stable  Encouraged to continue exercising on a regular basis  Continue using Symbicort  Plan  Prescription for BiPAP ST 14/10 with a backup rate of 12 Obtain download in 2 to 3 weeks following initiation of treatment  He may need mask changes as he has problems with having to secure the masks too tightly  I will see him back in the office in about 3 months Encouraged to call if any significant  concerns  Call with any significant concerns  Very important to follow-up on compliance and efficiency of the BiPAP

## 2019-04-05 NOTE — Patient Instructions (Signed)
DME referral  BiPAP ST  14/10 Backup rate of 12  Ensure download in 2 to 3 weeks following initiation is followed up on  We will start you at slightly lower pressures than what you are using before and keep an eye on the download from the machine  I will physically see you back in 3 months

## 2019-04-06 ENCOUNTER — Ambulatory Visit: Payer: Medicare Other | Attending: Internal Medicine

## 2019-04-06 DIAGNOSIS — Z20828 Contact with and (suspected) exposure to other viral communicable diseases: Secondary | ICD-10-CM | POA: Diagnosis not present

## 2019-04-06 DIAGNOSIS — Z20822 Contact with and (suspected) exposure to covid-19: Secondary | ICD-10-CM

## 2019-04-06 NOTE — Telephone Encounter (Signed)
New order placed with dx of central sleep apnea.

## 2019-04-06 NOTE — Telephone Encounter (Signed)
Patient has Central sleep apnea.  Has been using BIPAP ST for many years under [facility] care

## 2019-04-07 LAB — NOVEL CORONAVIRUS, NAA: SARS-CoV-2, NAA: DETECTED — AB

## 2019-04-08 ENCOUNTER — Other Ambulatory Visit: Payer: Self-pay | Admitting: Nurse Practitioner

## 2019-04-08 ENCOUNTER — Telehealth: Payer: Self-pay | Admitting: Nurse Practitioner

## 2019-04-08 DIAGNOSIS — E119 Type 2 diabetes mellitus without complications: Secondary | ICD-10-CM | POA: Insufficient documentation

## 2019-04-08 DIAGNOSIS — U071 COVID-19: Secondary | ICD-10-CM

## 2019-04-08 NOTE — Telephone Encounter (Signed)
  I connected by phone with William Howell on 04/08/2019 at 12:41 PM to discuss the potential use of an new treatment for mild to moderate COVID-19 viral infection in non-hospitalized patients.  Symptoms started on 04/04/2019 with him being at day 9 of symptoms by Monday 04/12/2019.  He continues to have ongoing mild SOB and fatigue + cough.    This patient is a 78 y.o. male that meets the FDA criteria for Emergency Use Authorization of bamlanivimab or casirivimab\imdevimab.  Has a (+) direct SARS-CoV-2 viral test result  Has mild or moderate COVID-19   Is ? 78 years of age and weighs ? 40 kg  Is NOT hospitalized due to COVID-19  Is NOT requiring oxygen therapy or requiring an increase in baseline oxygen flow rate due to COVID-19  Is within 10 days of symptom onset  Has at least one of the high risk factor(s) for progression to severe COVID-19 and/or hospitalization as defined in EUA.  Specific high risk criteria : >/= 78 yo with diabetes and HTN   I have spoken and communicated the following to the patient or parent/caregiver:  1. FDA has authorized the emergency use of bamlanivimab and casirivimab\imdevimab for the treatment of mild to moderate COVID-19 in adults and pediatric patients with positive results of direct SARS-CoV-2 viral testing who are 58 years of age and older weighing at least 40 kg, and who are at high risk for progressing to severe COVID-19 and/or hospitalization.  2. The significant known and potential risks and benefits of bamlanivimab and casirivimab\imdevimab, and the extent to which such potential risks and benefits are unknown.  3. Information on available alternative treatments and the risks and benefits of those alternatives, including clinical trials.  4. Patients treated with bamlanivimab and casirivimab\imdevimab should continue to self-isolate and use infection control measures (e.g., wear mask, isolate, social distance, avoid sharing personal items,  clean and disinfect "high touch" surfaces, and frequent handwashing) according to CDC guidelines.   5. The patient or parent/caregiver has the option to accept or refuse bamlanivimab or casirivimab\imdevimab .  After reviewing this information with the patient, The patient agreed to proceed with receiving the bamlanimivab infusion and will be provided a copy of the Fact sheet prior to receiving the infusion.Venita Lick 04/08/2019 12:41 PM

## 2019-04-08 NOTE — Progress Notes (Signed)
Monoclonal antibody infusion orders.

## 2019-04-08 NOTE — Telephone Encounter (Signed)
Patient scheduled for 1PM 12/28 with spouse

## 2019-04-12 ENCOUNTER — Telehealth: Payer: Self-pay | Admitting: Family Medicine

## 2019-04-12 ENCOUNTER — Ambulatory Visit (HOSPITAL_COMMUNITY)
Admission: RE | Admit: 2019-04-12 | Discharge: 2019-04-12 | Disposition: A | Discharge status: Home / Self Care | Payer: Medicare Other | Source: Ambulatory Visit | Attending: Pulmonary Disease | Admitting: Pulmonary Disease

## 2019-04-12 ENCOUNTER — Other Ambulatory Visit: Payer: Self-pay | Admitting: Family Medicine

## 2019-04-12 DIAGNOSIS — U071 COVID-19: Secondary | ICD-10-CM | POA: Diagnosis not present

## 2019-04-12 DIAGNOSIS — Z23 Encounter for immunization: Secondary | ICD-10-CM | POA: Diagnosis not present

## 2019-04-12 DIAGNOSIS — E119 Type 2 diabetes mellitus without complications: Secondary | ICD-10-CM | POA: Insufficient documentation

## 2019-04-12 MED ORDER — CIPROFLOXACIN HCL 500 MG PO TABS: 500.0000 mg | ORAL_TABLET | Freq: Two times a day (BID) | ORAL | 0 refills | Status: DC

## 2019-04-12 MED ORDER — EPINEPHRINE 0.3 MG/0.3ML IJ SOAJ: 0.3000 mg | Freq: Once | INTRAMUSCULAR | Status: DC | PRN

## 2019-04-12 MED ORDER — METHYLPREDNISOLONE SODIUM SUCC 125 MG IJ SOLR: 125.0000 mg | Freq: Once | INTRAMUSCULAR | Status: DC | PRN

## 2019-04-12 MED ORDER — ALBUTEROL SULFATE HFA 108 (90 BASE) MCG/ACT IN AERS: 2.0000 | INHALATION_SPRAY | Freq: Once | RESPIRATORY_TRACT | Status: DC | PRN

## 2019-04-12 MED ORDER — FAMOTIDINE IN NACL 20-0.9 MG/50ML-% IV SOLN: 20.0000 mg | Freq: Once | INTRAVENOUS | Status: DC | PRN

## 2019-04-12 MED ORDER — DIPHENHYDRAMINE HCL 50 MG/ML IJ SOLN: 50.0000 mg | Freq: Once | INTRAMUSCULAR | Status: DC | PRN

## 2019-04-12 MED ORDER — SODIUM CHLORIDE 0.9 % IV SOLN
700.0000 mg | Freq: Once | INTRAVENOUS | Status: AC
  Administered 2019-04-12: 13:00:00 700 mg via INTRAVENOUS
  Filled 2019-04-12: qty 20

## 2019-04-12 MED ORDER — SODIUM CHLORIDE 0.9 % IV SOLN
INTRAVENOUS | Status: DC | PRN
  Administered 2019-04-12: 250 mL via INTRAVENOUS

## 2019-04-12 NOTE — Telephone Encounter (Signed)
Spoke with patient he was seen today at [facility] for COVID antibody infusion as he recently tested positive. Patient states that for the past few days he has been having painful urination and has been passing some blood. He would like to know if you would be willing to call something in for his symptoms. Please advise?

## 2019-04-12 NOTE — Progress Notes (Signed)
  Diagnosis: COVID-19  Physician: Dr. Wright  Procedure: [facility] Med: bamlanivimab infusion - Provided patient with bamlanimivab fact sheet for patients, parents and caregivers prior to infusion.  Complications: No immediate complications noted.  Discharge: Discharged home   William Howell 04/12/2019   

## 2019-04-12 NOTE — Telephone Encounter (Signed)
Patient is in the hospital with covid but has terrible uti with blood, would like to know if there is any way something called in for the uti  [phone]

## 2019-04-12 NOTE — Telephone Encounter (Signed)
Sounds like he may have a urinary tract infection.  I will send in an antibiotic to his pharmacy to cover a possible urinary tract infection.

## 2019-04-12 NOTE — Telephone Encounter (Signed)
Left message return call

## 2019-04-12 NOTE — Discharge Instructions (Signed)
COVID-19 Frequently Asked Questions °COVID-19 (coronavirus disease) is an infection that is caused by a large family of viruses. Some viruses cause illness in people and others cause illness in animals like camels, cats, and bats. In some cases, the viruses that cause illness in animals can spread to humans. °Where did the coronavirus come from? °In December 2019, [place] told the World Health Organization (WHO) of several cases of lung disease (human respiratory illness). These cases were linked to an open seafood and livestock market in the city of [place]. The link to the seafood and livestock market suggests that the virus may have spread from animals to humans. However, since that first outbreak in December, the virus has also been shown to spread from person to person. °What is the name of the disease and the virus? °Disease name °Early on, this disease was called novel coronavirus. This is because scientists determined that the disease was caused by a new (novel) respiratory virus. The World Health Organization (WHO) has now named the disease COVID-19, or coronavirus disease. °Virus name °The virus that causes the disease is called severe acute respiratory syndrome coronavirus 2 (SARS-CoV-2). °More information on disease and virus naming °World Health Organization (WHO): www.who.int/emergencies/diseases/novel-coronavirus-2019/technical-guidance/naming-the-coronavirus-disease-(covid-2019)-and-the-virus-that-causes-it °Who is at risk for complications from coronavirus disease? °Some people may be at higher risk for complications from coronavirus disease. This includes older adults and people who have chronic diseases, such as heart disease, diabetes, and lung disease. °If you are at higher risk for complications, take these extra precautions: °· Avoid close contact with people who are sick or have a fever or cough. Stay at least 3-6 ft (1-2 m) away from them, if possible. °· Wash your hands often with soap and  water for at least 20 seconds. °· Avoid touching your face, mouth, nose, or eyes. °· Keep supplies on hand at home, such as food, medicine, and cleaning supplies. °· Stay home as much as possible. °· Avoid social gatherings and travel. °How does coronavirus disease spread? °The virus that causes coronavirus disease spreads easily from person to person (is contagious). There are also cases of community-spread disease. This means the disease has spread to: °· People who have no known contact with other infected people. °· People who have not traveled to areas where there are known cases. °It appears to spread from one person to another through droplets from coughing or sneezing. °Can I get the virus from touching surfaces or objects? °There is still a lot that we do not know about the virus that causes coronavirus disease. Scientists are basing a lot of information on what they know about similar viruses, such as: °· Viruses cannot generally survive on surfaces for long. They need a human body (host) to survive. °· It is more likely that the virus is spread by close contact with people who are sick (direct contact), such as through: °? Shaking hands or hugging. °? Breathing in respiratory droplets that travel through the air. This can happen when an infected person coughs or sneezes on or near other people. °· It is less likely that the virus is spread when a person touches a surface or object that has the virus on it (indirect contact). The virus may be able to enter the body if the person touches a surface or object and then touches his or her face, eyes, nose, or mouth. °Can a person spread the virus without having symptoms of the disease? °It may be possible for the virus to spread before a person   has symptoms of the disease, but this is most likely not the main way the virus is spreading. It is more likely for the virus to spread by being in close contact with people who are sick and breathing in the respiratory  droplets of a sick person's cough or sneeze. °What are the symptoms of coronavirus disease? °Symptoms vary from person to person and can range from mild to severe. Symptoms may include: °· Fever. °· Cough. °· Tiredness, weakness, or fatigue. °· Fast breathing or feeling short of breath. °These symptoms can appear anywhere from 2 to 14 days after you have been exposed to the virus. If you develop symptoms, call your health care provider. People with severe symptoms may need hospital care. °If I am exposed to the virus, how long does it take before symptoms start? °Symptoms of coronavirus disease may appear anywhere from 2 to 14 days after a person has been exposed to the virus. If you develop symptoms, call your health care provider. °Should I be tested for this virus? °Your health care provider will decide whether to test you based on your symptoms, history of exposure, and your risk factors. °How does a health care provider test for this virus? °Health care providers will collect samples to send for testing. Samples may include: °· Taking a swab of fluid from the nose. °· Taking fluid from the lungs by having you cough up mucus (sputum) into a sterile cup. °· Taking a blood sample. °· Taking a stool or urine sample. °Is there a treatment or vaccine for this virus? °Currently, there is no vaccine to prevent coronavirus disease. Also, there are no medicines like antibiotics or antivirals to treat the virus. A person who becomes sick is given supportive care, which means rest and fluids. A person may also relieve his or her symptoms by using over-the-counter medicines that treat sneezing, coughing, and runny nose. These are the same medicines that a person takes for the common cold. °If you develop symptoms, call your health care provider. People with severe symptoms may need hospital care. °What can I do to protect myself and my family from this virus? ° °  ° °You can protect yourself and your family by taking the  same actions that you would take to prevent the spread of other viruses. Take the following actions: °· Wash your hands often with soap and water for at least 20 seconds. If soap and water are not available, use alcohol-based hand sanitizer. °· Avoid touching your face, mouth, nose, or eyes. °· Cough or sneeze into a tissue, sleeve, or elbow. Do not cough or sneeze into your hand or the air. °? If you cough or sneeze into a tissue, throw it away immediately and wash your hands. °· Disinfect objects and surfaces that you frequently touch every day. °· Avoid close contact with people who are sick or have a fever or cough. Stay at least 3-6 ft (1-2 m) away from them, if possible. °· Stay home if you are sick, except to get medical care. Call your health care provider before you get medical care. °· Make sure your vaccines are up to date. Ask your health care provider what vaccines you need. °What should I do if I need to travel? °Follow travel recommendations from your local health authority, the CDC, and WHO. °Travel information and advice °· Centers for Disease Control and Prevention (CDC): www.cdc.gov/coronavirus/2019-ncov/travelers/index.html °· World Health Organization (WHO): www.who.int/emergencies/diseases/novel-coronavirus-2019/travel-advice °Know the risks and take action to protect your health °·   You are at higher risk of getting coronavirus disease if you are traveling to areas with an outbreak or if you are exposed to travelers from areas with an outbreak. °· Wash your hands often and practice good hygiene to lower the risk of catching or spreading the virus. °What should I do if I am sick? °General instructions to stop the spread of infection °· Wash your hands often with soap and water for at least 20 seconds. If soap and water are not available, use alcohol-based hand sanitizer. °· Cough or sneeze into a tissue, sleeve, or elbow. Do not cough or sneeze into your hand or the air. °· If you cough or  sneeze into a tissue, throw it away immediately and wash your hands. °· Stay home unless you must get medical care. Call your health care provider or local health authority before you get medical care. °· Avoid public areas. Do not take public transportation, if possible. °· If you can, wear a mask if you must go out of the house or if you are in close contact with someone who is not sick. °Keep your home clean °· Disinfect objects and surfaces that are frequently touched every day. This may include: °? Counters and tables. °? Doorknobs and light switches. °? Sinks and faucets. °? Electronics such as phones, remote controls, keyboards, computers, and tablets. °· Wash dishes in hot, soapy water or use a dishwasher. Air-dry your dishes. °· Wash laundry in hot water. °Prevent infecting other household members °· Let healthy household members care for children and pets, if possible. If you have to care for children or pets, wash your hands often and wear a mask. °· Sleep in a different bedroom or bed, if possible. °· Do not share personal items, such as razors, toothbrushes, deodorant, combs, brushes, towels, and washcloths. °Where to find more information °Centers for Disease Control and Prevention (CDC) °· Information and news updates: www.cdc.gov/coronavirus/2019-ncov °World Health Organization (WHO) °· Information and news updates: www.who.int/emergencies/diseases/novel-coronavirus-2019 °· Coronavirus health topic: www.who.int/health-topics/coronavirus °· Questions and answers on COVID-19: www.who.int/news-room/q-a-detail/q-a-coronaviruses °· Global tracker: who.sprinklr.com °American Academy of Pediatrics (AAP) °· Information for families: www.healthychildren.org/English/health-issues/conditions/chest-lungs/Pages/2019-Novel-Coronavirus.aspx °The coronavirus situation is changing rapidly. Check your local health authority website or the CDC and WHO websites for updates and news. °When should I contact a health care  provider? °· Contact your health care provider if you have symptoms of an infection, such as fever or cough, and you: °? Have been near anyone who is known to have coronavirus disease. °? Have come into contact with a person who is suspected to have coronavirus disease. °? Have traveled outside of the country. °When should I get emergency medical care? °· Get help right away by calling your local emergency services (911 in the [place] if you have: °? Trouble breathing. °? Pain or pressure in your chest. °? Confusion. °? Blue-tinged lips and fingernails. °? Difficulty waking from sleep. °? Symptoms that get worse. °Let the emergency medical personnel know if you think you have coronavirus disease. °Summary °· A new respiratory virus is spreading from person to person and causing COVID-19 (coronavirus disease). °· The virus that causes COVID-19 appears to spread easily. It spreads from one person to another through droplets from coughing or sneezing. °· Older adults and those with chronic diseases are at higher risk of disease. If you are at higher risk for complications, take extra precautions. °· There is currently no vaccine to prevent coronavirus disease. There are no medicines, such as antibiotics or   antivirals, to treat the virus. °· You can protect yourself and your family by washing your hands often, avoiding touching your face, and covering your coughs and sneezes. °This information is not intended to replace advice given to you by your health care provider. Make sure you discuss any questions you have with your health care provider. °Document Released: 07/28/2018 Document Revised: 07/28/2018 Document Reviewed: 07/28/2018 °[employer]vier Patient Education © 2020 [employer] ° ° °COVID-19 °COVID-19 is a respiratory infection that is caused by a virus called severe acute respiratory syndrome coronavirus 2 (SARS-CoV-2). The disease is also known as coronavirus disease or novel coronavirus. In some people, the virus  may not cause any symptoms. In others, it may cause a serious infection. The infection can get worse quickly and can lead to complications, such as: °· Pneumonia, or infection of the lungs. °· Acute respiratory distress syndrome or ARDS. This is fluid build-up in the lungs. °· Acute respiratory failure. This is a condition in which there is not enough oxygen passing from the lungs to the body. °· Sepsis or septic shock. This is a serious bodily reaction to an infection. °· Blood clotting problems. °· Secondary infections due to bacteria or fungus. °The virus that causes COVID-19 is contagious. This means that it can spread from person to person through droplets from coughs and sneezes (respiratory secretions). °What are the causes? °This illness is caused by a virus. You may catch the virus by: °· Breathing in droplets from an infected person's cough or sneeze. °· Touching something, like a table or a doorknob, that was exposed to the virus (contaminated) and then touching your mouth, nose, or eyes. °What increases the risk? °Risk for infection °You are more likely to be infected with this virus if you: °· Live in or travel to an area with a COVID-19 outbreak. °· Come in contact with a sick person who recently traveled to an area with a COVID-19 outbreak. °· Provide care for or live with a person who is infected with COVID-19. °Risk for serious illness °You are more likely to become seriously ill from the virus if you: °· Are 65 years of age or older. °· Have a long-term disease that lowers your body's ability to fight infection (immunocompromised). °· Live in a nursing home or long-term care facility. °· Have a long-term (chronic) disease such as: °? Chronic lung disease, including chronic obstructive pulmonary disease or asthma °? Heart disease. °? Diabetes. °? Chronic kidney disease. °? Liver disease. °· Are obese. °What are the signs or symptoms? °Symptoms of this condition can range from mild to severe.  Symptoms may appear any time from 2 to 14 days after being exposed to the virus. They include: °· A fever. °· A cough. °· Difficulty breathing. °· Chills. °· Muscle pains. °· A sore throat. °· Loss of taste or smell. °Some people may also have stomach problems, such as nausea, vomiting, or diarrhea. °Other people may not have any symptoms of COVID-19. °How is this diagnosed? °This condition may be diagnosed based on: °· Your signs and symptoms, especially if: °? You live in an area with a COVID-19 outbreak. °? You recently traveled to or from an area where the virus is common. °? You provide care for or live with a person who was diagnosed with COVID-19. °· A physical exam. °· Lab tests, which may include: °? A nasal swab to take a sample of fluid from your nose. °? A throat swab to take a sample of   fluid from your throat. °? A sample of mucus from your lungs (sputum). °? Blood tests. °· Imaging tests, which may include, X-rays, CT scan, or ultrasound. °How is this treated? °At present, there is no medicine to treat COVID-19. Medicines that treat other diseases are being used on a trial basis to see if they are effective against COVID-19. °Your health care provider will talk with you about ways to treat your symptoms. For most people, the infection is mild and can be managed at home with rest, fluids, and over-the-counter medicines. °Treatment for a serious infection usually takes places in a hospital intensive care unit (ICU). It may include one or more of the following treatments. These treatments are given until your symptoms improve. °· Receiving fluids and medicines through an IV. °· Supplemental oxygen. Extra oxygen is given through a tube in the nose, a face mask, or a hood. °· Positioning you to lie on your stomach (prone position). This makes it easier for oxygen to get into the lungs. °· Continuous positive airway pressure (CPAP) or bi-level positive airway pressure (BPAP) machine. This treatment uses mild  air pressure to keep the airways open. A tube that is connected to a motor delivers oxygen to the body. °· Ventilator. This treatment moves air into and out of the lungs by using a tube that is placed in your windpipe. °· Tracheostomy. This is a procedure to create a hole in the neck so that a breathing tube can be inserted. °· Extracorporeal membrane oxygenation (ECMO). This procedure gives the lungs a chance to recover by taking over the functions of the heart and lungs. It supplies oxygen to the body and removes carbon dioxide. °Follow these instructions at home: °Lifestyle °· If you are sick, stay home except to get medical care. Your health care provider will tell you how long to stay home. Call your health care provider before you go for medical care. °· Rest at home as told by your health care provider. °· Do not use any products that contain nicotine or tobacco, such as cigarettes, e-cigarettes, and chewing tobacco. If you need help quitting, ask your health care provider. °· Return to your normal activities as told by your health care provider. Ask your health care provider what activities are safe for you. °General instructions °· Take over-the-counter and prescription medicines only as told by your health care provider. °· Drink enough fluid to keep your urine pale yellow. °· Keep all follow-up visits as told by your health care provider. This is important. °How is this prevented? ° °There is no vaccine to help prevent COVID-19 infection. However, there are steps you can take to protect yourself and others from this virus. °To protect yourself:  °· Do not travel to areas where COVID-19 is a risk. The areas where COVID-19 is reported change often. To identify high-risk areas and travel restrictions, check the CDC travel website: wwwnc.cdc.gov/travel/notices °· If you live in, or must travel to, an area where COVID-19 is a risk, take precautions to avoid infection. °? Stay away from people who are  sick. °? Wash your hands often with soap and water for 20 seconds. If soap and water are not available, use an alcohol-based hand sanitizer. °? Avoid touching your mouth, face, eyes, or nose. °? Avoid going out in public, follow guidance from your state and local health authorities. °? If you must go out in public, wear a cloth face covering or face mask. °? Disinfect objects and surfaces that are   frequently touched every day. This may include: °§ Counters and tables. °§ Doorknobs and light switches. °§ Sinks and faucets. °§ Electronics, such as phones, remote controls, keyboards, computers, and tablets. °To protect others: °If you have symptoms of COVID-19, take steps to prevent the virus from spreading to others. °· If you think you have a COVID-19 infection, contact your health care provider right away. Tell your health care team that you think you may have a COVID-19 infection. °· Stay home. Leave your house only to seek medical care. Do not use public transport. °· Do not travel while you are sick. °· Wash your hands often with soap and water for 20 seconds. If soap and water are not available, use alcohol-based hand sanitizer. °· Stay away from other members of your household. Let healthy household members care for children and pets, if possible. If you have to care for children or pets, wash your hands often and wear a mask. If possible, stay in your own room, separate from others. Use a different bathroom. °· Make sure that all people in your household wash their hands well and often. °· Cough or sneeze into a tissue or your sleeve or elbow. Do not cough or sneeze into your hand or into the air. °· Wear a cloth face covering or face mask. °Where to find more information °· Centers for Disease Control and Prevention: www.cdc.gov/coronavirus/2019-ncov/index.html °· World Health Organization: www.who.int/health-topics/coronavirus °Contact a health care provider if: °· You live in or have traveled to an area  where COVID-19 is a risk and you have symptoms of the infection. °· You have had contact with someone who has COVID-19 and you have symptoms of the infection. °Get help right away if: °· You have trouble breathing. °· You have pain or pressure in your chest. °· You have confusion. °· You have bluish lips and fingernails. °· You have difficulty waking from sleep. °· You have symptoms that get worse. °These symptoms may represent a serious problem that is an emergency. Do not wait to see if the symptoms will go away. Get medical help right away. Call your local emergency services (911 in the [place] Do not drive yourself to the hospital. Let the emergency medical personnel know if you think you have COVID-19. °Summary °· COVID-19 is a respiratory infection that is caused by a virus. It is also known as coronavirus disease or novel coronavirus. It can cause serious infections, such as pneumonia, acute respiratory distress syndrome, acute respiratory failure, or sepsis. °· The virus that causes COVID-19 is contagious. This means that it can spread from person to person through droplets from coughs and sneezes. °· You are more likely to develop a serious illness if you are 65 years of age or older, have a weak immunity, live in a nursing home, or have chronic disease. °· There is no medicine to treat COVID-19. Your health care provider will talk with you about ways to treat your symptoms. °· Take steps to protect yourself and others from infection. Wash your hands often and disinfect objects and surfaces that are frequently touched every day. Stay away from people who are sick and wear a mask if you are sick. °This information is not intended to replace advice given to you by your health care provider. Make sure you discuss any questions you have with your health care provider. °Document Released: 05/07/2018 Document Revised: 08/27/2018 Document Reviewed: 05/07/2018 °Elsevier Patient Education © 2020 [employer] ° °COVID-19: How to Protect Yourself and   Others °Know how it spreads °· There is currently no vaccine to prevent coronavirus disease 2019 (COVID-19). °· The best way to prevent illness is to avoid being exposed to this virus. °· The virus is thought to spread mainly from person-to-person. °? Between people who are in close contact with one another (within about 6 feet). °? Through respiratory droplets produced when an infected person coughs, sneezes or talks. °? These droplets can land in the mouths or noses of people who are nearby or possibly be inhaled into the lungs. °? Some recent studies have suggested that COVID-19 may be spread by people who are not showing symptoms. °Everyone should °Clean your hands often °· Wash your hands often with soap and water for at least 20 seconds especially after you have been in a public place, or after blowing your nose, coughing, or sneezing. °· If soap and water are not readily available, use a hand sanitizer that contains at least 60% alcohol. Cover all surfaces of your hands and rub them together until they feel dry. °· Avoid touching your eyes, nose, and mouth with unwashed hands. °Avoid close contact °· Stay home if you are sick. °· Avoid close contact with people who are sick. °· Put distance between yourself and other people. °? Remember that some people without symptoms may be able to spread virus. °? This is especially important for people who are at higher risk of getting very sick.www.cdc.gov/coronavirus/2019-ncov/need-extra-precautions/people-at-higher-risk.html °Cover your mouth and nose with a cloth face cover when around others °· You could spread COVID-19 to others even if you do not feel sick. °· Everyone should wear a cloth face cover when they have to go out in public, for example to the grocery store or to pick up other necessities. °? Cloth face coverings should not be placed on young children under age 2, anyone who has trouble breathing, or is  unconscious, incapacitated or otherwise unable to remove the mask without assistance. °· The cloth face cover is meant to protect other people in case you are infected. °· Do NOT use a facemask meant for a healthcare worker. °· Continue to keep about 6 feet between yourself and others. The cloth face cover is not a substitute for social distancing. °Cover coughs and sneezes °· If you are in a private setting and do not have on your cloth face covering, remember to always cover your mouth and nose with a tissue when you cough or sneeze or use the inside of your elbow. °· Throw used tissues in the trash. °· Immediately wash your hands with soap and water for at least 20 seconds. If soap and water are not readily available, clean your hands with a hand sanitizer that contains at least 60% alcohol. °Clean and disinfect °· Clean AND disinfect frequently touched surfaces daily. This includes tables, doorknobs, light switches, countertops, handles, desks, phones, keyboards, toilets, faucets, and sinks. www.cdc.gov/coronavirus/2019-ncov/prevent-getting-sick/disinfecting-your-home.html °· If surfaces are dirty, clean them: Use detergent or soap and water prior to disinfection. °· Then, use a household disinfectant. You can see a list of EPA-registered household disinfectants here. °cdc.gov/coronavirus °08/18/2018 °This information is not intended to replace advice given to you by your health care provider. Make sure you discuss any questions you have with your health care provider. °Document Released: 07/28/2018 Document Revised: 08/26/2018 Document Reviewed: 07/28/2018 °Elsevier Patient Education © 2020 Elsevier Inc. ° °

## 2019-04-13 NOTE — Telephone Encounter (Signed)
Spoke with patient and informed him that medication was sent to pharmacy. Patient verbalized understanding.

## 2019-04-18 ENCOUNTER — Encounter (INDEPENDENT_AMBULATORY_CARE_PROVIDER_SITE_OTHER): Payer: Self-pay

## 2019-04-19 ENCOUNTER — Ambulatory Visit: Payer: Medicare Other | Admitting: Family Medicine

## 2019-04-22 ENCOUNTER — Other Ambulatory Visit: Payer: Self-pay | Admitting: Family Medicine

## 2019-04-27 ENCOUNTER — Telehealth: Payer: Self-pay | Admitting: Family Medicine

## 2019-04-27 ENCOUNTER — Other Ambulatory Visit: Payer: Self-pay | Admitting: Family Medicine

## 2019-04-27 MED ORDER — CIPROFLOXACIN HCL 500 MG PO TABS: 500.0000 mg | ORAL_TABLET | Freq: Two times a day (BID) | ORAL | 0 refills | Status: DC

## 2019-04-27 NOTE — Telephone Encounter (Signed)
Pt called - LMOVM - stating that the UTI is much better but thinks it may not be gone and would like another round of antibx as he thinks that will get rid of it?  Called pt to ask about symptoms he has now - LMOVMTRC.

## 2019-04-27 NOTE — Telephone Encounter (Signed)
Ok with 7 more days of cipro 500 bid for 7 days

## 2019-05-09 ENCOUNTER — Other Ambulatory Visit: Payer: Self-pay | Admitting: Family Medicine

## 2019-05-10 ENCOUNTER — Other Ambulatory Visit: Payer: Self-pay | Admitting: Family Medicine

## 2019-05-18 DIAGNOSIS — G4731 Primary central sleep apnea: Secondary | ICD-10-CM | POA: Diagnosis not present

## 2019-05-26 ENCOUNTER — Other Ambulatory Visit: Payer: Self-pay | Admitting: Pulmonary Disease

## 2019-05-27 ENCOUNTER — Other Ambulatory Visit: Payer: Self-pay | Admitting: Family Medicine

## 2019-06-27 ENCOUNTER — Other Ambulatory Visit: Payer: Self-pay | Admitting: Family Medicine

## 2019-07-10 ENCOUNTER — Ambulatory Visit: Payer: Medicare Other | Attending: Internal Medicine

## 2019-07-10 DIAGNOSIS — Z23 Encounter for immunization: Secondary | ICD-10-CM

## 2019-07-10 NOTE — Progress Notes (Signed)
   Covid-19 Vaccination Clinic  Name:  William Howell    MRN: [id number] DOB: 02-Jul-1940  07/10/2019  [name] was observed post Covid-19 immunization for 15 minutes without incident. He was provided with Vaccine Information Sheet and instruction to access the V-Safe system.   [name] was instructed to call 911 with any severe reactions post vaccine: Marland Kitchen Difficulty breathing  . Swelling of face and throat  . A fast heartbeat  . A bad rash all over body  . Dizziness and weakness   Immunizations Administered    Name Date Dose VIS Date Route   Pfizer COVID-19 Vaccine 07/10/2019  8:29 AM 0.3 mL 03/26/2019 Intramuscular   Manufacturer: [employer], [employer]   Lot: [id number]   NDC: [address]-6

## 2019-07-12 ENCOUNTER — Ambulatory Visit: Payer: Medicare Other | Admitting: Internal Medicine

## 2019-07-12 ENCOUNTER — Telehealth: Payer: Self-pay | Admitting: Pulmonary Disease

## 2019-07-12 ENCOUNTER — Other Ambulatory Visit: Payer: Self-pay | Admitting: Pulmonary Disease

## 2019-07-12 NOTE — Telephone Encounter (Signed)
Spoke with the pt's spouse  I advised needs appt for refills  He was due back March 2021  Appt scheduled for 08/09/19 He has enough med to last until this appt and then additional refills can be prescribed

## 2019-07-14 DIAGNOSIS — G4731 Primary central sleep apnea: Secondary | ICD-10-CM | POA: Diagnosis not present

## 2019-07-14 DIAGNOSIS — G4737 Central sleep apnea in conditions classified elsewhere: Secondary | ICD-10-CM | POA: Diagnosis not present

## 2019-07-14 DIAGNOSIS — G4730 Sleep apnea, unspecified: Secondary | ICD-10-CM | POA: Diagnosis not present

## 2019-07-19 ENCOUNTER — Ambulatory Visit (INDEPENDENT_AMBULATORY_CARE_PROVIDER_SITE_OTHER): Payer: Medicare Other | Admitting: Internal Medicine

## 2019-07-19 ENCOUNTER — Other Ambulatory Visit: Payer: Self-pay

## 2019-07-19 ENCOUNTER — Encounter: Payer: Self-pay | Admitting: Internal Medicine

## 2019-07-19 DIAGNOSIS — I4510 Unspecified right bundle-branch block: Secondary | ICD-10-CM

## 2019-07-19 DIAGNOSIS — G4733 Obstructive sleep apnea (adult) (pediatric): Secondary | ICD-10-CM

## 2019-07-19 DIAGNOSIS — I10 Essential (primary) hypertension: Secondary | ICD-10-CM | POA: Diagnosis not present

## 2019-07-19 DIAGNOSIS — E118 Type 2 diabetes mellitus with unspecified complications: Secondary | ICD-10-CM | POA: Diagnosis not present

## 2019-07-19 DIAGNOSIS — E782 Mixed hyperlipidemia: Secondary | ICD-10-CM | POA: Diagnosis not present

## 2019-07-19 DIAGNOSIS — Z9989 Dependence on other enabling machines and devices: Secondary | ICD-10-CM

## 2019-07-19 NOTE — Patient Instructions (Signed)
Medication Instructions:  Your physician recommends that you continue on your current medications as directed. Please refer to the Current Medication list given to you today.  *If you need a refill on your cardiac medications before your next appointment, please call your pharmacy*   Lab Work: A1c, Lipid Panel, CMET today If you have labs (blood work) drawn today and your tests are completely normal, you will receive your results only by: Marland Kitchen MyChart Message (if you have MyChart) OR . A paper copy in the mail If you have any lab test that is abnormal or we need to change your treatment, we will call you to review the results.   Testing/Procedures: NONE   Follow-Up: At [facility], you and your health needs are our priority.  As part of our continuing mission to provide you with exceptional heart care, we have created designated Provider Care Teams.  These Care Teams include your primary Cardiologist (physician) and Advanced Practice Providers (APPs -  Physician Assistants and Nurse Practitioners) who all work together to provide you with the care you need, when you need it.  We recommend signing up for the patient portal called "MyChart".  Sign up information is provided on this After Visit Summary.  MyChart is used to connect with patients for Virtual Visits (Telemedicine).  Patients are able to view lab/test results, encounter notes, upcoming appointments, etc.  Non-urgent messages can be sent to your provider as well.   To learn more about what you can do with MyChart, go to [email or web address].    Your next appointment:   12 month(s)  The format for your next appointment:   In Person  Provider:   You may see Dr. Rennis Golden or one of the following Advanced Practice Providers on your designated Care Team:    Azalee Course, PA-C  Micah Flesher, [place] or   Judy Pimple, [place]    Other Instructions

## 2019-07-19 NOTE — Progress Notes (Signed)
OFFICE CONSULT NOTE  Chief Complaint:  Longstanding dyspnea  Primary Care Physician: Donita Brooks, MD  HPI:  William Howell is a 79 y.o. male who is being seen today for the evaluation of DOE and fatigue at the request of Dr. Kendrick Fries. William Howell has a history of morbid obesity, type 2 diabetes, hypertension, asthma and obstructive sleep apnea on CPAP. He reports at least 3 or 4 years of significant dyspnea on exertion. His weight is been fairly stable although at one time in the past he lost more than 50 pounds on a commercial weight loss diet but regained the weight. Recently he's been working with his pulmonologist for shortness of breath. He says in general is improved however he is concerned about possible underlying coronary disease. Certainly has multiple coronary risk factors and a family history of heart attack in his father who also had pulmonary disease and it may been related to using his inhaler. He also has a sister who died of heart attack. Mr. William Howell feels short of breath when exerting himself, particularly walking up stairs or doing some physical activities. He denies any chest pain, pressure, heaviness or associated symptoms.  10/07/2016  William Howell returns today for follow-up. He underwent a nuclear stress test which was negative for ischemia but showed a fixed inferior defect. This could potentially be scar however suspect that likely bowel attenuation artifact given his obesity. The echocardiogram showed normal inferior wall motion in that territory without any evidence of scar. Echo did also showed normal systolic function, mild LVH, normal diastolic dysfunction and mild aortic insufficiency. I don't believe the degree of aortic insufficiency that he has would explain his shortness of breath. Rather think his symptoms are multifactorial including obesity, asthma and perhaps upper airway resistance / poor diaphragmatic excursion.  04/21/2017  William Howell was seen  today in follow-up.  He denies any worsening shortness of breath or chest pain.  He is down a few pounds and has lost more weight but gained some back during the holidays.  He previously had lost about 60 pounds with commercial weight loss program, but gained the weight back.  He says he is not currently interested in a referral to weight loss program because he is busy managing to farms.  He understands he needs to continue to lose weight.  06/01/2018  William Howell is seen today in follow-up.  His shortness of breath is stable.  Unfortunately weight is stable and excessive.  He is not been able to lose any additional weight.  He denies any chest pain.  He did have some mild aortic insufficiency on echo which does not seem to symptomatically change.  He reports compliance with CPAP but has not had it reassessed recently.  He is followed by pulmonary and may need to see a sleep specialist there to reassess his equipment.  He saw his PCP in January.  Labs in October showed total cholesterol 130, HDL 37, LDL 75 and triglycerides 93.  Hemoglobin A1c was 6.2.  07/19/2019  William Howell returns today for follow-up.  He reports a stable dyspnea on exertion.  Weight unfortunately has not come down significantly.  He continues to work on 2 different forms.  He contracted COVID-19 in December and did have monoclonal antibody infusion and outpatient treatment but was not ill enough to be admitted.  That being said he was significantly sick for at least a week.  He seems to have pretty much recovered from that.  He is  compliant with his CPAP.  He is due for lab work which we will obtain today since he is fasting.  He has a follow-up with his PCP next week.  PMHx:  Past Medical History:  Diagnosis Date  . Allergy   . Asthma   . Diabetes mellitus type II, controlled (HCC)   . GERD (gastroesophageal reflux disease)    pt states he takes omeprazole but doesnt have GERD  . Hypertension   . Obesity   . Sleep apnea     uses CPAP    Past Surgical History:  Procedure Laterality Date  . CHOLECYSTECTOMY    . DENTAL SURGERY      FAMHx:  Family History  Problem Relation Age of Onset  . Heart disease Father   . Heart attack Father   . Heart attack Sister   . Colon cancer Neg Hx   . Colon polyps Neg Hx   . Esophageal cancer Neg Hx   . Rectal cancer Neg Hx   . Stomach cancer Neg Hx   . Pancreatic cancer Neg Hx   . Liver cancer Neg Hx   . Prostate cancer Neg Hx     SOCHx:   reports that he has never smoked. He has quit using smokeless tobacco.  His smokeless tobacco use included chew. He reports current alcohol use. He reports that he does not use drugs.  ALLERGIES:  No Known Allergies  ROS: Pertinent items noted in HPI and remainder of comprehensive ROS otherwise negative.  HOME MEDS: Current Outpatient Medications on File Prior to Visit  Medication Sig Dispense Refill  . Albuterol Sulfate (VENTOLIN HFA IN) Inhale into the lungs.    Marland Kitchen aspirin EC 81 MG tablet Take 81 mg daily by mouth.    . B Complex Vitamins (B COMPLEX 1 PO) Take by mouth.    . Calcium Carb-Cholecalciferol (CALCIUM 1000 + D PO) Take 1 tablet by mouth 2 (two) times daily.    . ciprofloxacin (CIPRO) 500 MG tablet Take 1 tablet (500 mg total) by mouth 2 (two) times daily. 14 tablet 0  . EQ ALLERGY RELIEF, CETIRIZINE, 10 MG tablet Take 1 tablet by mouth once daily 90 tablet 2  . glucosamine-chondroitin 500-400 MG tablet Take 1 tablet 2 (two) times daily by mouth.    . losartan (COZAAR) 50 MG tablet Take 1 tablet by mouth once daily 90 tablet 2  . metFORMIN (GLUCOPHAGE) 500 MG tablet TAKE 1 TABLET BY MOUTH TWICE DAILY WITH A MEAL 180 tablet 1  . Multiple Vitamins-Minerals (MULTIVITAMIN WITH MINERALS) tablet Take 1 tablet daily by mouth.    . Omega-3 Fatty Acids (FISH OIL) 1000 MG CAPS Take 1 capsule by mouth daily.    Marland Kitchen omeprazole (PRILOSEC) 40 MG capsule Take 1 capsule by mouth once daily 90 capsule 0  . SYMBICORT 160-4.5  MCG/ACT inhaler Inhale 2 puffs by mouth twice daily 11 g 0  . tamsulosin (FLOMAX) 0.4 MG CAPS capsule Take 1 capsule by mouth once daily 90 capsule 3  . VENTOLIN HFA 108 (90 Base) MCG/ACT inhaler INHALE 1 TO 2 PUFFS BY MOUTH EVERY 6 HOURS AS NEEDED 18 g 5  . zafirlukast (ACCOLATE) 20 MG tablet TAKE 1 TABLET BY MOUTH TWICE DAILY BEFORE A MEAL 180 tablet 3   Current Facility-Administered Medications on File Prior to Visit  Medication Dose Route Frequency Provider Last Rate Last Admin  . 0.9 %  sodium chloride infusion  500 mL Intravenous Once Iva Boop, MD  LABS/IMAGING: No results found for this or any previous visit (from the past 48 hour(s)). No results found.  LIPID PANEL:    Component Value Date/Time   CHOL 130 02/12/2018 0908   TRIG 93 02/12/2018 0908   HDL 37 (L) 02/12/2018 0908   CHOLHDL 3.5 02/12/2018 0908   VLDL 20 02/19/2016 0918   LDLCALC 75 02/12/2018 0908    WEIGHTS: Wt Readings from Last 3 Encounters:  04/05/19 (!) 317 lb 12.8 oz (144.2 kg)  01/16/19 (!) 320 lb (145.2 kg)  01/04/19 (!) 322 lb 4.8 oz (146.2 kg)    VITALS: There were no vitals taken for this visit.  EXAM: General appearance: alert, no distress and morbidly obese Neck: no carotid bruit, no JVD and thyroid not enlarged, symmetric, no tenderness/mass/nodules Lungs: clear to auscultation bilaterally Heart: regular rate and rhythm, S1, S2 normal and diastolic murmur: early diastolic 2/6, blowing at 2nd right intercostal space Abdomen: soft, non-tender; bowel sounds normal; no masses,  no organomegaly Extremities: extremities normal, atraumatic, no cyanosis or edema Pulses: 2+ and symmetric Skin: Skin color, texture, turgor normal. No rashes or lesions Neurologic: Grossly normal Psych: Pleasant  EKG: Sinus rhythm with PVCs at 83, RBBB-personally reviewed  ASSESSMENT: 1. Stable dyspnea on exertion and fatigue - low risk stress test, fixed inferior attenuation artifact  (09/2016) 2. LVEF 60-65%, mild AI, mild LVH (09/2016) 3. Morbid obesity 4. RBBB 5. Hypertension 6. Dyslipidemia 7. Asthma 8. OSA on CPAP 9. DM2  PLAN: 1.   Mr. Opal Sidles reports stable dyspnea on exertion.  Unfortunately weight has not significantly come down.  He has a persistent right bundle branch block.  He had COVID-19 in December however seems to have recovered back to baseline from that.  Blood pressures well controlled today.  He reports compliance with CPAP.  He will need repeat labs to reassess his dyslipidemia and diabetes.  Per his request we will order those today since he is fasting and he will have follow-up with his PCP next week.  Follow-up annually sooner as necessary.  Pixie Casino, MD, [facility], [place] Director of the Advanced Lipid Disorders &  Cardiovascular Risk Reduction Clinic Diplomate of the American Board of Clinical Lipidology Attending Cardiologist  Direct Dial: [phone]  Fax: [phone]  Website:  [email or web address]  Nadean Corwin Treydon Henricks 07/19/2019, 8:11 AM

## 2019-07-20 LAB — COMPREHENSIVE METABOLIC PANEL
ALT: 21 IU/L (ref 0–44)
AST: 21 IU/L (ref 0–40)
Albumin/Globulin Ratio: 1.7 (ref 1.2–2.2)
Albumin: 4.3 g/dL (ref 3.7–4.7)
Alkaline Phosphatase: 77 IU/L (ref 39–117)
BUN/Creatinine Ratio: 16 (ref 10–24)
BUN: 20 mg/dL (ref 8–27)
Bilirubin Total: 0.4 mg/dL (ref 0.0–1.2)
CO2: 27 mmol/L (ref 20–29)
Calcium: 9.8 mg/dL (ref 8.6–10.2)
Chloride: 101 mmol/L (ref 96–106)
Creatinine, Ser: 1.29 mg/dL — ABNORMAL HIGH (ref 0.76–1.27)
GFR calc Af Amer: 61 mL/min/{1.73_m2} (ref >59)
GFR calc non Af Amer: 53 mL/min/{1.73_m2} — ABNORMAL LOW (ref >59)
Globulin, Total: 2.5 g/dL (ref 1.5–4.5)
Glucose: 111 mg/dL — ABNORMAL HIGH (ref 65–99)
Potassium: 4.9 mmol/L (ref 3.5–5.2)
Sodium: 142 mmol/L (ref 134–144)
Total Protein: 6.8 g/dL (ref 6.0–8.5)

## 2019-07-20 LAB — LIPID PANEL
Chol/HDL Ratio: 2.8 ratio (ref 0.0–5.0)
Cholesterol, Total: 145 mg/dL (ref 100–199)
HDL: 51 mg/dL (ref >39)
LDL Chol Calc (NIH): 76 mg/dL (ref 0–99)
Triglycerides: 94 mg/dL (ref 0–149)
VLDL Cholesterol Cal: 18 mg/dL (ref 5–40)

## 2019-07-20 LAB — HEMOGLOBIN A1C
Est. average glucose Bld gHb Est-mCnc: 126 mg/dL
Hgb A1c MFr Bld: 6 % — ABNORMAL HIGH (ref 4.8–5.6)

## 2019-07-26 ENCOUNTER — Encounter: Payer: Self-pay | Admitting: Family Medicine

## 2019-07-26 ENCOUNTER — Other Ambulatory Visit: Payer: Self-pay

## 2019-07-26 ENCOUNTER — Ambulatory Visit (INDEPENDENT_AMBULATORY_CARE_PROVIDER_SITE_OTHER): Payer: Medicare Other | Admitting: Family Medicine

## 2019-07-26 VITALS — BP 154/90 | HR 90 | Temp 97.8°F | Resp 18 | Ht 66.0 in | Wt 319.0 lb

## 2019-07-26 DIAGNOSIS — Z0001 Encounter for general adult medical examination with abnormal findings: Secondary | ICD-10-CM

## 2019-07-26 DIAGNOSIS — Z0000 Encounter for general adult medical examination without abnormal findings: Secondary | ICD-10-CM

## 2019-07-26 DIAGNOSIS — E119 Type 2 diabetes mellitus without complications: Secondary | ICD-10-CM

## 2019-07-26 DIAGNOSIS — R300 Dysuria: Secondary | ICD-10-CM | POA: Diagnosis not present

## 2019-07-26 DIAGNOSIS — I10 Essential (primary) hypertension: Secondary | ICD-10-CM

## 2019-07-26 DIAGNOSIS — G4733 Obstructive sleep apnea (adult) (pediatric): Secondary | ICD-10-CM

## 2019-07-26 LAB — URINALYSIS, ROUTINE W REFLEX MICROSCOPIC
Bilirubin Urine: NEGATIVE
Glucose, UA: NEGATIVE
Hgb urine dipstick: NEGATIVE
Ketones, ur: NEGATIVE
Leukocytes,Ua: NEGATIVE
Nitrite: NEGATIVE
Protein, ur: NEGATIVE
Specific Gravity, Urine: 1.02 (ref 1.001–1.03)
pH: 5 (ref 5.0–8.0)

## 2019-07-26 NOTE — Progress Notes (Signed)
Subjective:    Patient ID: William Howell, male    DOB: 01-09-1941, 79 y.o.   MRN: [id number]  HPI Here today for CPE.  Last colonoscopy was 2019 and revealed only diverticulosis.  Therefore, no further colon cancer screening required.  Due to age, would not recommend prostate cancer screening further unless symptoms develop. Immunizations are listed below and are up to date.  Had sleep study 10/20 which had AHI >40 and recommended Bipap 20/4.  Patient denies any falls, depression, or memory loss.  He does report some mild dysuria however his urinalysis today is completely normal with no nitrites.  No leukocyte esterase.  And no blood.  Immunization History  Administered Date(s) Administered  . Fluad Quad(high Dose 65+) 01/04/2019  . Influenza, High Dose Seasonal PF 02/17/2017  . Influenza,inj,Quad PF,6+ Mos 02/10/2014, 03/14/2015, 01/09/2016, 02/12/2018  . Influenza-Unspecified 02/12/2018  . PFIZER SARS-COV-2 Vaccination 07/10/2019  . Pneumococcal Conjugate-13 07/04/2014  . Pneumococcal Polysaccharide-23 12/26/2011  . Td 12/26/2011  . Tdap 12/26/2011  . Zoster 02/10/2014   Most recent labs are listed below: No visits with results within 1 Week(s) from this visit.  Latest known visit with results is:  Office Visit on 07/19/2019  Component Date Value Ref Range Status  . Hgb A1c MFr Bld 07/19/2019 6.0* 4.8 - 5.6 % Final   Comment:          Prediabetes: 5.7 - 6.4          Diabetes: >6.4          Glycemic control for adults with diabetes: <7.0   . Est. average glucose Bld gHb Est-m* 07/19/2019 126  mg/dL Final  . Glucose 07/19/2019 111* 65 - 99 mg/dL Final  . BUN 07/19/2019 20  8 - 27 mg/dL Final  . Creatinine, Ser 07/19/2019 1.29* 0.76 - 1.27 mg/dL Final  . GFR calc non Af Amer 07/19/2019 53* >59 mL/min/1.73 Final  . GFR calc Af Amer 07/19/2019 61  >59 mL/min/1.73 Final  . BUN/Creatinine Ratio 07/19/2019 16  10 - 24 Final  . Sodium 07/19/2019 142  134 - 144 mmol/L Final  .  Potassium 07/19/2019 4.9  3.5 - 5.2 mmol/L Final  . Chloride 07/19/2019 101  96 - 106 mmol/L Final  . CO2 07/19/2019 27  20 - 29 mmol/L Final  . Calcium 07/19/2019 9.8  8.6 - 10.2 mg/dL Final  . Total Protein 07/19/2019 6.8  6.0 - 8.5 g/dL Final  . Albumin 07/19/2019 4.3  3.7 - 4.7 g/dL Final  . Globulin, Total 07/19/2019 2.5  1.5 - 4.5 g/dL Final  . Albumin/Globulin Ratio 07/19/2019 1.7  1.2 - 2.2 Final  . Bilirubin Total 07/19/2019 0.4  0.0 - 1.2 mg/dL Final  . Alkaline Phosphatase 07/19/2019 77  39 - 117 IU/L Final  . AST 07/19/2019 21  0 - 40 IU/L Final  . ALT 07/19/2019 21  0 - 44 IU/L Final  . Cholesterol, Total 07/19/2019 145  100 - 199 mg/dL Final  . Triglycerides 07/19/2019 94  0 - 149 mg/dL Final  . HDL 07/19/2019 51  >39 mg/dL Final  . VLDL Cholesterol Cal 07/19/2019 18  5 - 40 mg/dL Final  . LDL Chol Calc (NIH) 07/19/2019 76  0 - 99 mg/dL Final  . Chol/HDL Ratio 07/19/2019 2.8  0.0 - 5.0 ratio Final   Comment:  T. Chol/HDL Ratio                                             Men  Women                               1/2 Avg.Risk  3.4    3.3                                   Avg.Risk  5.0    4.4                                2X Avg.Risk  9.6    7.1                                3X Avg.Risk 23.4   11.0     Past Medical History:  Diagnosis Date  . Allergy   . Asthma   . Diabetes mellitus type II, controlled (HCC)   . GERD (gastroesophageal reflux disease)    pt states he takes omeprazole but doesnt have GERD  . Hypertension   . Obesity   . Sleep apnea    uses CPAP   Past Surgical History:  Procedure Laterality Date  . CHOLECYSTECTOMY    . DENTAL SURGERY     Current Outpatient Medications on File Prior to Visit  Medication Sig Dispense Refill  . Albuterol Sulfate (VENTOLIN HFA IN) Inhale into the lungs.    Marland Kitchen aspirin EC 81 MG tablet Take 81 mg daily by mouth.    . B Complex Vitamins (B COMPLEX 1 PO) Take by mouth.    . Calcium  Carb-Cholecalciferol (CALCIUM 1000 + D PO) Take 1 tablet by mouth 2 (two) times daily.    Burman Blacksmith ALLERGY RELIEF, CETIRIZINE, 10 MG tablet Take 1 tablet by mouth once daily 90 tablet 2  . glucosamine-chondroitin 500-400 MG tablet Take 1 tablet 2 (two) times daily by mouth.    . losartan (COZAAR) 50 MG tablet Take 1 tablet by mouth once daily 90 tablet 2  . metFORMIN (GLUCOPHAGE) 500 MG tablet TAKE 1 TABLET BY MOUTH TWICE DAILY WITH A MEAL 180 tablet 1  . Multiple Vitamins-Minerals (MULTIVITAMIN WITH MINERALS) tablet Take 1 tablet daily by mouth.    . Omega-3 Fatty Acids (FISH OIL) 1000 MG CAPS Take 1 capsule by mouth daily.    Marland Kitchen omeprazole (PRILOSEC) 40 MG capsule Take 1 capsule by mouth once daily 90 capsule 0  . SYMBICORT 160-4.5 MCG/ACT inhaler Inhale 2 puffs by mouth twice daily 11 g 0  . tamsulosin (FLOMAX) 0.4 MG CAPS capsule Take 1 capsule by mouth once daily 90 capsule 3  . VENTOLIN HFA 108 (90 Base) MCG/ACT inhaler INHALE 1 TO 2 PUFFS BY MOUTH EVERY 6 HOURS AS NEEDED 18 g 5  . zafirlukast (ACCOLATE) 20 MG tablet TAKE 1 TABLET BY MOUTH TWICE DAILY BEFORE A MEAL 180 tablet 3   Current Facility-Administered Medications on File Prior to Visit  Medication Dose Route Frequency Provider Last Rate Last Admin  . 0.9 %  sodium chloride infusion  500 mL Intravenous Once  Iva Boop, MD       No Known Allergies Social History   Socioeconomic History  . Marital status: Married    Spouse name: Not on file  . Number of children: Not on file  . Years of education: Not on file  . Highest education level: Not on file  Occupational History  . Not on file  Tobacco Use  . Smoking status: Never Smoker  . Smokeless tobacco: Former Neurosurgeon    Types: Chew  Substance and Sexual Activity  . Alcohol use: Yes    Comment: occasional   . Drug use: No  . Sexual activity: Not on file  Other Topics Concern  . Not on file  Social History Narrative  . Not on file   Social Determinants of Health    Financial Resource Strain:   . Difficulty of Paying Living Expenses:   Food Insecurity:   . Worried About Programme researcher, broadcasting/film/video in the Last Year:   . Barista in the Last Year:   Transportation Needs:   . Freight forwarder (Medical):   Marland Kitchen Lack of Transportation (Non-Medical):   Physical Activity:   . Days of Exercise per Week:   . Minutes of Exercise per Session:   Stress:   . Feeling of Stress :   Social Connections:   . Frequency of Communication with Friends and Family:   . Frequency of Social Gatherings with Friends and Family:   . Attends Religious Services:   . Active Member of Clubs or Organizations:   . Attends Banker Meetings:   Marland Kitchen Marital Status:   Intimate Partner Violence:   . Fear of Current or Ex-Partner:   . Emotionally Abused:   Marland Kitchen Physically Abused:   . Sexually Abused:       Review of Systems  All other systems reviewed and are negative.      Objective:   Physical Exam  Constitutional: He is oriented to person, place, and time. He appears well-developed and well-nourished. No distress.  HENT:  Head: Normocephalic and atraumatic.  Right Ear: External ear normal.  Left Ear: External ear normal.  Nose: Nose normal.  Mouth/Throat: Oropharynx is clear and moist. No oropharyngeal exudate.  Eyes: Pupils are equal, round, and reactive to light. Conjunctivae and EOM are normal. Right eye exhibits no discharge. Left eye exhibits no discharge. No scleral icterus.  Neck: No JVD present. No tracheal deviation present. No thyromegaly present.  Cardiovascular: Normal rate, regular rhythm, normal heart sounds and intact distal pulses. Exam reveals no gallop and no friction rub.  No murmur heard. Pulmonary/Chest: Effort normal and breath sounds normal. No stridor. No respiratory distress. He has no wheezes. He has no rales. He exhibits no tenderness.  Abdominal: Soft. Bowel sounds are normal. He exhibits no distension and no mass. There is no  abdominal tenderness. There is no rebound and no guarding.  Musculoskeletal:        General: No edema. Normal range of motion.     Cervical back: Normal range of motion and neck supple.  Lymphadenopathy:    He has no cervical adenopathy.  Neurological: He is alert and oriented to person, place, and time. He has normal reflexes. No cranial nerve deficit. He exhibits normal muscle tone. Coordination normal.  Skin: Skin is warm. No rash noted. He is not diaphoretic. No erythema. No pallor.  Psychiatric: He has a normal mood and affect. His behavior is normal. Judgment and thought content normal.  Vitals reviewed.         Assessment & Plan:  Dysuria - Plan: Urinalysis, Routine w reflex microscopic  Morbid obesity (HCC)  Routine general medical examination at a health care facility  Diet-controlled diabetes mellitus (HCC)  Obstructive sleep apnea syndrome  Benign essential HTN  Urinalysis is normal.  I see no evidence of urinary tract infection.  Encourage weight loss even 10 or 15 pounds I think would help the patient's knee pain and also his dyspnea.  Blood pressure today is elevated however he went to his cardiologist last week and it was excellent.  Of asked him to check his blood pressure every day and notify me of the values in 1 week.  If consistently greater than 140/90 I would increase his antihypertensives.  I reviewed his lab work and his hemoglobin A1c was outstanding at 6.0.  His LDL cholesterol is outstanding at less than 100.  His immunizations are up-to-date.  Colonoscopy is up-to-date.  Did not recommend prostate cancer screening.  Strongly encouraged him to be compliant with his BiPAP.

## 2019-08-04 ENCOUNTER — Ambulatory Visit: Payer: Medicare Other | Attending: Internal Medicine

## 2019-08-04 DIAGNOSIS — Z23 Encounter for immunization: Secondary | ICD-10-CM

## 2019-08-04 NOTE — Progress Notes (Signed)
   Covid-19 Vaccination Clinic  Name:  William Howell    MRN: [id number] DOB: Mar 22, 1941  08/04/2019  Mr. William Howell was observed post Covid-19 immunization for 15 minutes without incident. He was provided with Vaccine Information Sheet and instruction to access the V-Safe system.   Mr. William Howell was instructed to call 911 with any severe reactions post vaccine: Marland Kitchen Difficulty breathing  . Swelling of face and throat  . A fast heartbeat  . A bad rash all over body  . Dizziness and weakness   Immunizations Administered    Name Date Dose VIS Date Route   Pfizer COVID-19 Vaccine 08/04/2019  8:22 AM 0.3 mL 06/09/2018 Intramuscular   Manufacturer: [employer], [employer]   Lot: [id number]   NDC: [address]-0

## 2019-08-09 ENCOUNTER — Ambulatory Visit: Payer: Medicare Other | Admitting: Pulmonary Disease

## 2019-08-09 ENCOUNTER — Encounter: Payer: Self-pay | Admitting: Pulmonary Disease

## 2019-08-09 ENCOUNTER — Other Ambulatory Visit: Payer: Self-pay

## 2019-08-09 VITALS — BP 142/86 | HR 79 | Temp 98.0°F | Ht 68.0 in | Wt 316.8 lb

## 2019-08-09 DIAGNOSIS — G4731 Primary central sleep apnea: Secondary | ICD-10-CM

## 2019-08-09 MED ORDER — SYMBICORT 160-4.5 MCG/ACT IN AERO: 2.0000 | INHALATION_SPRAY | Freq: Two times a day (BID) | RESPIRATORY_TRACT | 6 refills | Status: DC

## 2019-08-09 NOTE — Progress Notes (Signed)
Subjective:    Patient ID: William Howell, male    DOB: Aug 26, 1940, 79 y.o.   MRN: [id number]  Synopsis: Referred in August 2017 for evaluation of shortness of breath related to asthma.  He has asthma and obstructive sleep apnea. He recently had a repeat sleep study showing severe obstructive sleep apnea Past history of central sleep apnea for which he is on BiPAP ST   HPI Patient with a history of asthma-on Symbicort-controlled symptoms History of obstructive sleep apnea-compliant with BiPAP treatment on 16/10-on BiPAP ST Continues to have problems with mask leak otherwise feels machine works well  Repeat sleep study shows severe obstructive sleep apnea  Denies any significant change in his health since last visit Compliant with Symbicort Rarely uses albuterol  Has chronic cramping in his legs Not exercising on a regular basis Some weight gain, this is unchanged from previous Feels he is functioning fairly well, no daytime naps Continues to use Symbicort regularly  Past Medical History:  Diagnosis Date  . Allergy   . Asthma   . Diabetes mellitus type II, controlled ([place])   . GERD (gastroesophageal reflux disease)    pt states he takes omeprazole but doesnt have GERD  . Hypertension   . Obesity   . Sleep apnea    uses CPAP   Family History  Problem Relation Age of Onset  . Heart disease Father   . Heart attack Father   . Heart attack Sister   . Colon cancer Neg Hx   . Colon polyps Neg Hx   . Esophageal cancer Neg Hx   . Rectal cancer Neg Hx   . Stomach cancer Neg Hx   . Pancreatic cancer Neg Hx   . Liver cancer Neg Hx   . Prostate cancer Neg Hx    Social History   Socioeconomic History  . Marital status: Married    Spouse name: Not on file  . Number of children: Not on file  . Years of education: Not on file  . Highest education level: Not on file  Occupational History  . Not on file  Tobacco Use  . Smoking status: Never Smoker  . Smokeless  tobacco: Former Systems developer    Types: Chew  Substance and Sexual Activity  . Alcohol use: Yes    Comment: occasional   . Drug use: No  . Sexual activity: Not on file  Other Topics Concern  . Not on file  Social History Narrative  . Not on file   Social Determinants of Health   Financial Resource Strain:   . Difficulty of Paying Living Expenses:   Food Insecurity:   . Worried About Charity fundraiser in the Last Year:   . Arboriculturist in the Last Year:   Transportation Needs:   . Film/video editor (Medical):   Marland Kitchen Lack of Transportation (Non-Medical):   Physical Activity:   . Days of Exercise per Week:   . Minutes of Exercise per Session:   Stress:   . Feeling of Stress :   Social Connections:   . Frequency of Communication with Friends and Family:   . Frequency of Social Gatherings with Friends and Family:   . Attends Religious Services:   . Active Member of Clubs or Organizations:   . Attends Archivist Meetings:   Marland Kitchen Marital Status:   Intimate Partner Violence:   . Fear of Current or Ex-Partner:   . Emotionally Abused:   Marland Kitchen Physically Abused:   .  Sexually Abused:    Review of Systems  Constitutional: Negative for chills, fatigue and fever.  HENT: Negative for postnasal drip.   Respiratory: Positive for apnea.   Musculoskeletal: Positive for arthralgias. Negative for joint swelling.  Skin: Negative.   Psychiatric/Behavioral: Positive for sleep disturbance.     Objective:   Physical Exam  Constitutional: He is oriented to person, place, and time. No distress.  Obese  HENT:  Head: Normocephalic and atraumatic.  Eyes: Pupils are equal, round, and reactive to light. Conjunctivae are normal.  Neck: No tracheal deviation present. No thyromegaly present.  Cardiovascular: Normal rate and regular rhythm.  Pulmonary/Chest: Effort normal and breath sounds normal. No stridor. No respiratory distress. He has no wheezes. He has no rales. He exhibits no tenderness.   Musculoskeletal:     Cervical back: Normal range of motion and neck supple.  Neurological: He is alert and oriented to person, place, and time.  Skin: He is not diaphoretic.    Vitals:   08/09/19 0958  BP: (!) 142/86  Pulse: 79  Temp: 98 F (36.7 C)  TempSrc: Temporal  SpO2: 100%  Weight: (!) 316 lb 12.8 oz (143.7 kg)  Height: 5\' 8"  (1.727 m)    Records from his December 2017 visit with pulmonary interstitial practitioner reviewed were he had bronchitis and was treated with prednisone and Augmentin.  PFT: September 2017 lung function testing ratio 80%, FEV1 2.28 L 77% predicted, FVC 2.86 L 70% predicted, total lung capacity 5.07 L 74% predicted, DLCO 26.92 86% predicted  Labs: August 2017 proBNP normal August 2017 chemistry with slightly elevated bicarbonate, hemoglobin 15.0   Imaging: October 2017 high-resolution CT chest images personally reviewed showing air-trapping consistent with small airways disease, some mild scattered patchy groundglass but nothing to suggest underlying interstitial lung disease. 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   Home sleep study revealed severe obstructive sleep apnea Assessment & Plan:  Central sleep apnea-on BiPAP 16/10, BiPAP ST  Asthma-moderate persistent-stable symptoms-controlled symptoms  Morbid obesity-difficulty with his knee, weight has remained stable  Encouraged to continue exercising on a regular basis  Continue using Symbicort  Plan  Continue with BiPAP ST 14/10 with a backup rate of 12  We will attempt to obtain download from admission showing to the medical supply company Continues to require mask fitting/assistance with an adequate mask  I will see him back in the office in about 6 months Encouraged to call if any significant concerns  Call with any significant concerns  Symbicort refilled  Compliance data reveals 97% compliance with BiPAP, current setting 14/10 with a backup rate of  12 Residual AHI of 4.1

## 2019-08-09 NOTE — Patient Instructions (Signed)
Central sleep apnea -Continue use of BiPAP ST  Asthma -Continue with Symbicort  We will call [facility] to try and get a download from your machine  I will follow-up with you in 6 months Call with any significant concerns

## 2019-08-17 DIAGNOSIS — G4730 Sleep apnea, unspecified: Secondary | ICD-10-CM | POA: Diagnosis not present

## 2019-08-17 DIAGNOSIS — G4731 Primary central sleep apnea: Secondary | ICD-10-CM | POA: Diagnosis not present

## 2019-08-17 DIAGNOSIS — G4737 Central sleep apnea in conditions classified elsewhere: Secondary | ICD-10-CM | POA: Diagnosis not present

## 2019-09-13 ENCOUNTER — Other Ambulatory Visit: Payer: Self-pay | Admitting: Family Medicine

## 2019-10-10 DIAGNOSIS — S83282A Other tear of lateral meniscus, current injury, left knee, initial encounter: Secondary | ICD-10-CM | POA: Diagnosis not present

## 2019-11-22 DIAGNOSIS — S83282D Other tear of lateral meniscus, current injury, left knee, subsequent encounter: Secondary | ICD-10-CM | POA: Diagnosis not present

## 2019-12-05 ENCOUNTER — Other Ambulatory Visit: Payer: Self-pay | Admitting: Family Medicine

## 2020-01-11 DIAGNOSIS — G4731 Primary central sleep apnea: Secondary | ICD-10-CM | POA: Diagnosis not present

## 2020-01-11 DIAGNOSIS — G4730 Sleep apnea, unspecified: Secondary | ICD-10-CM | POA: Diagnosis not present

## 2020-01-11 DIAGNOSIS — G4737 Central sleep apnea in conditions classified elsewhere: Secondary | ICD-10-CM | POA: Diagnosis not present

## 2020-02-07 ENCOUNTER — Ambulatory Visit: Payer: Medicare Other

## 2020-02-07 DIAGNOSIS — H25013 Cortical age-related cataract, bilateral: Secondary | ICD-10-CM | POA: Diagnosis not present

## 2020-02-07 DIAGNOSIS — H35373 Puckering of macula, bilateral: Secondary | ICD-10-CM | POA: Diagnosis not present

## 2020-02-07 DIAGNOSIS — H2513 Age-related nuclear cataract, bilateral: Secondary | ICD-10-CM | POA: Diagnosis not present

## 2020-02-07 DIAGNOSIS — E119 Type 2 diabetes mellitus without complications: Secondary | ICD-10-CM | POA: Diagnosis not present

## 2020-02-07 LAB — HM DIABETES EYE EXAM

## 2020-02-13 ENCOUNTER — Other Ambulatory Visit: Payer: Self-pay | Admitting: Family Medicine

## 2020-02-21 ENCOUNTER — Other Ambulatory Visit: Payer: Self-pay | Admitting: Family Medicine

## 2020-02-21 ENCOUNTER — Ambulatory Visit (INDEPENDENT_AMBULATORY_CARE_PROVIDER_SITE_OTHER): Payer: Medicare Other | Admitting: Family Medicine

## 2020-02-21 ENCOUNTER — Other Ambulatory Visit: Payer: Self-pay

## 2020-02-21 ENCOUNTER — Ambulatory Visit: Payer: Medicare Other

## 2020-02-21 ENCOUNTER — Ambulatory Visit
Admission: RE | Admit: 2020-02-21 | Discharge: 2020-02-21 | Disposition: A | Discharge status: Home / Self Care | Payer: Medicare Other | Source: Ambulatory Visit | Attending: Family Medicine | Admitting: Family Medicine

## 2020-02-21 VITALS — BP 160/80 | HR 91 | Temp 97.5°F | Ht 68.0 in | Wt 296.0 lb

## 2020-02-21 DIAGNOSIS — J4541 Moderate persistent asthma with (acute) exacerbation: Secondary | ICD-10-CM

## 2020-02-21 DIAGNOSIS — Z125 Encounter for screening for malignant neoplasm of prostate: Secondary | ICD-10-CM | POA: Diagnosis not present

## 2020-02-21 DIAGNOSIS — R7303 Prediabetes: Secondary | ICD-10-CM

## 2020-02-21 DIAGNOSIS — R918 Other nonspecific abnormal finding of lung field: Secondary | ICD-10-CM | POA: Diagnosis not present

## 2020-02-21 MED ORDER — LEVOFLOXACIN 500 MG PO TABS: 500.0000 mg | ORAL_TABLET | Freq: Every day | ORAL | 0 refills | Status: DC

## 2020-02-21 MED ORDER — PREDNISONE 20 MG PO TABS: ORAL_TABLET | ORAL | 0 refills | Status: DC

## 2020-02-21 MED ORDER — METFORMIN HCL 500 MG PO TABS
ORAL_TABLET | ORAL | 3 refills | Status: DC
Start: 2020-02-21 — End: 2020-03-06

## 2020-02-21 NOTE — Progress Notes (Signed)
Subjective:    Patient ID: William Howell, male    DOB: March 12, 1941, 79 y.o.   MRN: [id number]  HPI  Patient is a very pleasant 79 year old Caucasian male who presents today with shortness of breath, cough, and wheezing.  He has a history of moderate persistent asthma and he takes Symbicort 2 puffs inhaled twice daily.  He states that over the last 3 to 4 weeks he has been dealing with a daily cough and congestion.  He is wheezing and reports shortness of breath.  The cough is nonproductive or when he does bring up sputum is clear.  He denies any chest pain.  He denies any pleurisy.  He denies any fever.  He had Covid last December, had monoclonal antibody infusion, and also had the Covid vaccine afterwards.  Therefore I believe the odds of him having Covid again are unlikely.  He also denies any hemoptysis or purulent sputum.  He does have a history of allergies and he is a Visual merchandiser.  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  His exam today is significant for bilateral wheezing in all 4 lung fields and rhonchorous breath sounds particularly at the right base.  Past Medical History:  Diagnosis Date  . Allergy   . Asthma   . Diabetes mellitus type II, controlled ([facility])   . GERD (gastroesophageal reflux disease)    pt states he takes omeprazole but doesnt have GERD  . Hypertension   . Obesity   . Sleep apnea    uses CPAP   Past Surgical History:  Procedure Laterality Date  . CHOLECYSTECTOMY    . DENTAL SURGERY     Current Outpatient Medications on File Prior to Visit  Medication Sig Dispense Refill  . Albuterol Sulfate (VENTOLIN HFA IN) Inhale into the lungs.    Marland Kitchen aspirin EC 81 MG tablet Take 81 mg daily by mouth.    . B Complex Vitamins (B COMPLEX 1 PO) Take by mouth.    . Calcium Carb-Cholecalciferol (CALCIUM 1000 + D PO) Take 1 tablet by mouth 2 (two)  times daily.    Burman Blacksmith ALLERGY RELIEF, CETIRIZINE, 10 MG tablet Take 1 tablet by mouth once daily 90 tablet 2  . glucosamine-chondroitin 500-400 MG tablet Take 1 tablet 2 (two) times daily by mouth.    . losartan (COZAAR) 50 MG tablet Take 1 tablet by mouth once daily 90 tablet 0  . metFORMIN (GLUCOPHAGE) 500 MG tablet TAKE 1 TABLET BY MOUTH TWICE DAILY WITH A MEAL 60 tablet 0  . Multiple Vitamins-Minerals (MULTIVITAMIN WITH MINERALS) tablet Take 1 tablet daily by mouth.    . Omega-3 Fatty Acids (FISH OIL) 1000 MG CAPS Take 1 capsule by mouth daily.    Marland Kitchen omeprazole (PRILOSEC) 40 MG capsule Take 1 capsule by mouth once daily 90 capsule 1  . SYMBICORT 160-4.5 MCG/ACT inhaler Inhale 2 puffs into the lungs 2 (two) times daily. 11 g 6  . tamsulosin (FLOMAX) 0.4 MG CAPS capsule Take 1 capsule by mouth once daily 90 capsule 3  . VENTOLIN HFA 108 (90 Base) MCG/ACT inhaler INHALE 1 TO 2 PUFFS BY MOUTH EVERY 6 HOURS AS NEEDED 18 g 5  . zafirlukast (ACCOLATE) 20 MG tablet TAKE 1 TABLET BY MOUTH TWICE DAILY BEFORE A MEAL 180 tablet 3   Current Facility-Administered Medications on File Prior to  Visit  Medication Dose Route Frequency Provider Last Rate Last Admin  . 0.9 %  sodium chloride infusion  500 mL Intravenous Once Iva Boop, MD       No Known Allergies Social History   Socioeconomic History  . Marital status: Married    Spouse name: Not on file  . Number of children: Not on file  . Years of education: Not on file  . Highest education level: Not on file  Occupational History  . Not on file  Tobacco Use  . Smoking status: Never Smoker  . Smokeless tobacco: Former Neurosurgeon    Types: Engineer, drilling  . Vaping Use: Never used  Substance and Sexual Activity  . Alcohol use: Yes    Comment: occasional   . Drug use: No  . Sexual activity: Not on file  Other Topics Concern  . Not on file  Social History Narrative  . Not on file   Social Determinants of Health   Financial Resource  Strain:   . Difficulty of Paying Living Expenses: Not on file  Food Insecurity:   . Worried About Programme researcher, broadcasting/film/video in the Last Year: Not on file  . Ran Out of Food in the Last Year: Not on file  Transportation Needs:   . Lack of Transportation (Medical): Not on file  . Lack of Transportation (Non-Medical): Not on file  Physical Activity:   . Days of Exercise per Week: Not on file  . Minutes of Exercise per Session: Not on file  Stress:   . Feeling of Stress : Not on file  Social Connections:   . Frequency of Communication with Friends and Family: Not on file  . Frequency of Social Gatherings with Friends and Family: Not on file  . Attends Religious Services: Not on file  . Active Member of Clubs or Organizations: Not on file  . Attends Banker Meetings: Not on file  . Marital Status: Not on file  Intimate Partner Violence:   . Fear of Current or Ex-Partner: Not on file  . Emotionally Abused: Not on file  . Physically Abused: Not on file  . Sexually Abused: Not on file      Review of Systems  All other systems reviewed and are negative.      Objective:   Physical Exam Vitals reviewed.  Constitutional:      General: He is not in acute distress.    Appearance: He is well-developed. He is not diaphoretic.  HENT:     Head: Normocephalic and atraumatic.     Right Ear: External ear normal.     Left Ear: External ear normal.     Nose: Nose normal.     Mouth/Throat:     Pharynx: No oropharyngeal exudate.  Eyes:     General: No scleral icterus.       Right eye: No discharge.        Left eye: No discharge.     Conjunctiva/sclera: Conjunctivae normal.     Pupils: Pupils are equal, round, and reactive to light.  Neck:     Thyroid: No thyromegaly.     Vascular: No JVD.     Trachea: No tracheal deviation.  Cardiovascular:     Rate and Rhythm: Normal rate and regular rhythm.     Heart sounds: Normal heart sounds. No murmur heard.  No friction rub. No  gallop.   Pulmonary:     Effort: Pulmonary effort is normal. Prolonged expiration  present. No accessory muscle usage or respiratory distress.     Breath sounds: Decreased air movement present. No stridor. Examination of the right-upper field reveals wheezing. Examination of the left-upper field reveals wheezing. Examination of the right-lower field reveals wheezing and rhonchi. Examination of the left-lower field reveals wheezing. Wheezing and rhonchi present. No rales.  Chest:     Chest wall: No tenderness.  Abdominal:     General: Bowel sounds are normal. There is no distension.     Palpations: Abdomen is soft. There is no mass.     Tenderness: There is no abdominal tenderness. There is no guarding or rebound.  Musculoskeletal:        General: Normal range of motion.     Cervical back: Normal range of motion and neck supple.  Lymphadenopathy:     Cervical: No cervical adenopathy.  Skin:    General: Skin is warm.     Coloration: Skin is not pale.     Findings: No erythema or rash.  Neurological:     Mental Status: He is alert and oriented to person, place, and time.     Cranial Nerves: No cranial nerve deficit.     Motor: No abnormal muscle tone.     Coordination: Coordination normal.     Deep Tendon Reflexes: Reflexes are normal and symmetric.  Psychiatric:        Behavior: Behavior normal.        Thought Content: Thought content normal.        Judgment: Judgment normal.           Assessment & Plan:  Prediabetes - Plan: Hemoglobin A1c, CBC with Differential/Platelet, COMPLETE METABOLIC PANEL WITH GFR, Lipid panel  Prostate cancer screening - Plan: PSA  Moderate persistent asthma with exacerbation - Plan: DG Chest 2 View, predniSONE (DELTASONE) 20 MG tablet  I believe the patient is having an asthma exacerbation and allergy exacerbation brought on by the fall season and also his occupational exposure.  Begin prednisone taper pack in addition albuterol 2 puffs inhaled every  6 hours.  Recheck if no better in 3 to 4 days or sooner if worsening.  Given the rhonchorous breath sounds in the right base I would also obtain a chest x-ray.  While the patient is here I would like to get a PSA to screen for prostate cancer and monitor his prediabetes with a hemoglobin A1c, CBC, CMP, and a fasting lipid panel.  Patient does report difficulty urinating, weak stream, and occasional dribbling.  He is already on tamsulosin.  I question if he may benefit from finasteride as well.  Await the results of his PSA.

## 2020-02-22 LAB — CBC WITH DIFFERENTIAL/PLATELET
Absolute Monocytes: 876 cells/uL (ref 200–950)
Basophils Absolute: 62 cells/uL (ref 0–200)
Basophils Relative: 0.6 %
Eosinophils Absolute: 422 cells/uL (ref 15–500)
Eosinophils Relative: 4.1 %
HCT: 41.3 % (ref 38.5–50.0)
Hemoglobin: 13.8 g/dL (ref 13.2–17.1)
Lymphs Abs: 2616 cells/uL (ref 850–3900)
MCH: 30.5 pg (ref 27.0–33.0)
MCHC: 33.4 g/dL (ref 32.0–36.0)
MCV: 91.4 fL (ref 80.0–100.0)
MPV: 9.7 fL (ref 7.5–12.5)
Monocytes Relative: 8.5 %
Neutro Abs: 6324 cells/uL (ref 1500–7800)
Neutrophils Relative %: 61.4 %
Platelets: 370 10*3/uL (ref 140–400)
RBC: 4.52 10*6/uL (ref 4.20–5.80)
RDW: 12.9 % (ref 11.0–15.0)
Total Lymphocyte: 25.4 %
WBC: 10.3 10*3/uL (ref 3.8–10.8)

## 2020-02-22 LAB — PSA: PSA: 4.57 ng/mL — ABNORMAL HIGH (ref <=4.0)

## 2020-02-22 LAB — COMPLETE METABOLIC PANEL WITH GFR
AG Ratio: 1.4 (calc) (ref 1.0–2.5)
ALT: 13 U/L (ref 9–46)
AST: 17 U/L (ref 10–35)
Albumin: 3.9 g/dL (ref 3.6–5.1)
Alkaline phosphatase (APISO): 71 U/L (ref 35–144)
BUN/Creatinine Ratio: 12 (calc) (ref 6–22)
BUN: 15 mg/dL (ref 7–25)
CO2: 26 mmol/L (ref 20–32)
Calcium: 9.5 mg/dL (ref 8.6–10.3)
Chloride: 104 mmol/L (ref 98–110)
Creat: 1.26 mg/dL — ABNORMAL HIGH (ref 0.70–1.18)
GFR, Est African American: 62 mL/min/{1.73_m2} (ref >=60)
GFR, Est Non African American: 54 mL/min/{1.73_m2} — ABNORMAL LOW (ref >=60)
Globulin: 2.7 g/dL (calc) (ref 1.9–3.7)
Glucose, Bld: 154 mg/dL — ABNORMAL HIGH (ref 65–99)
Potassium: 4.7 mmol/L (ref 3.5–5.3)
Sodium: 141 mmol/L (ref 135–146)
Total Bilirubin: 0.4 mg/dL (ref 0.2–1.2)
Total Protein: 6.6 g/dL (ref 6.1–8.1)

## 2020-02-22 LAB — HEMOGLOBIN A1C
Hgb A1c MFr Bld: 6.3 % of total Hgb — ABNORMAL HIGH (ref <5.7)
Mean Plasma Glucose: 134 (calc)
eAG (mmol/L): 7.4 (calc)

## 2020-02-22 LAB — LIPID PANEL
Cholesterol: 141 mg/dL (ref <200)
HDL: 41 mg/dL (ref >=40)
LDL Cholesterol (Calc): 77 mg/dL (calc)
Non-HDL Cholesterol (Calc): 100 mg/dL (calc) (ref <130)
Total CHOL/HDL Ratio: 3.4 (calc) (ref <5.0)
Triglycerides: 132 mg/dL (ref <150)

## 2020-03-05 ENCOUNTER — Other Ambulatory Visit: Payer: Self-pay | Admitting: Family Medicine

## 2020-03-07 DIAGNOSIS — H25811 Combined forms of age-related cataract, right eye: Secondary | ICD-10-CM | POA: Diagnosis not present

## 2020-03-07 DIAGNOSIS — H25011 Cortical age-related cataract, right eye: Secondary | ICD-10-CM | POA: Diagnosis not present

## 2020-03-07 DIAGNOSIS — H2511 Age-related nuclear cataract, right eye: Secondary | ICD-10-CM | POA: Diagnosis not present

## 2020-03-27 ENCOUNTER — Other Ambulatory Visit: Payer: Self-pay

## 2020-03-27 ENCOUNTER — Ambulatory Visit (INDEPENDENT_AMBULATORY_CARE_PROVIDER_SITE_OTHER): Payer: Medicare Other | Admitting: Family Medicine

## 2020-03-27 DIAGNOSIS — Z23 Encounter for immunization: Secondary | ICD-10-CM | POA: Diagnosis not present

## 2020-04-10 ENCOUNTER — Other Ambulatory Visit: Payer: Self-pay | Admitting: Pulmonary Disease

## 2020-04-13 ENCOUNTER — Other Ambulatory Visit: Payer: Self-pay | Admitting: Family Medicine

## 2020-04-17 ENCOUNTER — Other Ambulatory Visit: Payer: Self-pay | Admitting: Family Medicine

## 2020-04-25 DIAGNOSIS — H25812 Combined forms of age-related cataract, left eye: Secondary | ICD-10-CM | POA: Diagnosis not present

## 2020-04-25 DIAGNOSIS — H2512 Age-related nuclear cataract, left eye: Secondary | ICD-10-CM | POA: Diagnosis not present

## 2020-04-25 DIAGNOSIS — H25012 Cortical age-related cataract, left eye: Secondary | ICD-10-CM | POA: Diagnosis not present

## 2020-05-10 ENCOUNTER — Other Ambulatory Visit: Payer: Self-pay | Admitting: Pulmonary Disease

## 2020-05-15 ENCOUNTER — Ambulatory Visit: Payer: Medicare Other | Admitting: Pulmonary Disease

## 2020-05-15 ENCOUNTER — Other Ambulatory Visit: Payer: Self-pay

## 2020-05-15 ENCOUNTER — Encounter: Payer: Self-pay | Admitting: Pulmonary Disease

## 2020-05-15 VITALS — BP 124/76 | HR 77 | Temp 98.0°F | Ht 69.0 in | Wt 297.6 lb

## 2020-05-15 DIAGNOSIS — Z9989 Dependence on other enabling machines and devices: Secondary | ICD-10-CM

## 2020-05-15 DIAGNOSIS — G4733 Obstructive sleep apnea (adult) (pediatric): Secondary | ICD-10-CM

## 2020-05-15 NOTE — Progress Notes (Signed)
Subjective:    Patient ID: William Howell, male    DOB: Nov 04, 1940, 80 y.o.   MRN: [id number]  Synopsis: Referred in August 2017 for evaluation of shortness of breath related to asthma.  William Howell has asthma and obstructive sleep apnea. William Howell recently had a repeat sleep study showing severe obstructive sleep apnea Past history of central sleep apnea for which William Howell is on BiPAP ST Continues to be very compliant with BiPAP use  HPI Patient with a history of asthma-on Symbicort-controlled symptoms continues to use Symbicort on a regular basis Has not been as active Since the cold and went outside Remains very compliant with BiPAP ST Still has some issues with the mask  Denies any significant change in his health since last visit, did have a fall recently Compliant with Symbicort Rarely uses albuterol  Has chronic cramping in his legs Not exercising on a regular basis Weight is relatively stable  Past Medical History:  Diagnosis Date  . Allergy   . Asthma   . Diabetes mellitus type II, controlled ([facility])   . GERD (gastroesophageal reflux disease)    pt states William Howell takes omeprazole but doesnt have GERD  . Hypertension   . Obesity   . Sleep apnea    uses CPAP   Family History  Problem Relation Age of Onset  . Heart disease Father   . Heart attack Father   . Heart attack Sister   . Colon cancer Neg Hx   . Colon polyps Neg Hx   . Esophageal cancer Neg Hx   . Rectal cancer Neg Hx   . Stomach cancer Neg Hx   . Pancreatic cancer Neg Hx   . Liver cancer Neg Hx   . Prostate cancer Neg Hx    Social History   Socioeconomic History  . Marital status: Married    Spouse name: Not on file  . Number of children: Not on file  . Years of education: Not on file  . Highest education level: Not on file  Occupational History  . Not on file  Tobacco Use  . Smoking status: Never Smoker  . Smokeless tobacco: Former Neurosurgeon    Types: Engineer, drilling  . Vaping Use: Never used  Substance and  Sexual Activity  . Alcohol use: Yes    Comment: occasional   . Drug use: No  . Sexual activity: Not on file  Other Topics Concern  . Not on file  Social History Narrative  . Not on file   Social Determinants of Health   Financial Resource Strain: Not on file  Food Insecurity: Not on file  Transportation Needs: Not on file  Physical Activity: Not on file  Stress: Not on file  Social Connections: Not on file  Intimate Partner Violence: Not on file   Review of Systems  Constitutional: Negative for chills, fatigue and fever.  HENT: Negative for postnasal drip.   Respiratory: Positive for apnea.   Musculoskeletal: Positive for arthralgias. Negative for joint swelling.  Skin: Negative.   Psychiatric/Behavioral: Positive for sleep disturbance.     Objective:   Physical Exam Constitutional:      General: William Howell is not in acute distress.    Appearance: William Howell is obese. William Howell is not diaphoretic.     Comments: Obese  Neck:     Thyroid: No thyromegaly.     Trachea: No tracheal deviation.  Cardiovascular:     Rate and Rhythm: Normal rate and regular rhythm.  Pulmonary:  Effort: Pulmonary effort is normal. No respiratory distress.     Breath sounds: Normal breath sounds. No stridor. No wheezing, rhonchi or rales.  Neurological:     Mental Status: William Howell is alert.  Psychiatric:        Mood and Affect: Mood normal.     Vitals:   05/15/20 1627  BP: 124/76  Pulse: 77  Temp: 98 F (36.7 C)  TempSrc: Oral  SpO2: 96%  Weight: 297 lb 9.6 oz (135 kg)  Height: 5\' 9"  (1.753 m)    Records from his December 2017 visit with pulmonary interstitial practitioner reviewed were William Howell had bronchitis and was treated with prednisone and Augmentin.  PFT: September 2017 lung function testing ratio 80%, FEV1 2.28 L 77% predicted, FVC 2.86 L 70% predicted, total lung capacity 5.07 L 74% predicted, DLCO 26.92 86% predicted  Labs: August 2017 proBNP normal August 2017 chemistry with slightly elevated  bicarbonate, hemoglobin 15.0   Imaging: October 2017 high-resolution CT chest images personally reviewed showing air-trapping consistent with small airways disease, some mild scattered patchy groundglass but nothing to suggest underlying interstitial lung disease. 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   Home sleep study revealed severe obstructive sleep apnea  Compliance data reveals 97% compliance with BiPAP Average use of 7 hours 10 minutes Residual AHI of 4.1  Assessment & Plan:  Central sleep apnea-on BiPAP 14 /10, BiPAP ST, rate of 12 -Good compliance  Asthma-moderate persistent-stable symptoms -Control symptoms at present -Continue Symbicort  Morbid obesity- weight has remained stable -Encourage graded exercise as tolerated  Plan  Continue with BiPAP ST  14/10 with a backup rate of 12   I will see him back in the office in about 6 months  Encouraged to focus on exercising regularly

## 2020-05-15 NOTE — Patient Instructions (Signed)
I will see you back in 6 months  Graded exercise as tolerated  Continue BiPAP on a regular basis  Call with any significant concerns

## 2020-05-17 ENCOUNTER — Other Ambulatory Visit: Payer: Self-pay | Admitting: Family Medicine

## 2020-05-24 DIAGNOSIS — H35373 Puckering of macula, bilateral: Secondary | ICD-10-CM | POA: Diagnosis not present

## 2020-06-05 DIAGNOSIS — G4731 Primary central sleep apnea: Secondary | ICD-10-CM | POA: Diagnosis not present

## 2020-06-05 DIAGNOSIS — G4730 Sleep apnea, unspecified: Secondary | ICD-10-CM | POA: Diagnosis not present

## 2020-06-05 DIAGNOSIS — G4737 Central sleep apnea in conditions classified elsewhere: Secondary | ICD-10-CM | POA: Diagnosis not present

## 2020-06-08 ENCOUNTER — Other Ambulatory Visit: Payer: Self-pay | Admitting: Family Medicine

## 2020-06-09 ENCOUNTER — Telehealth: Payer: Self-pay | Admitting: Pulmonary Disease

## 2020-06-09 MED ORDER — SYMBICORT 160-4.5 MCG/ACT IN AERO: 2.0000 | INHALATION_SPRAY | Freq: Two times a day (BID) | RESPIRATORY_TRACT | 6 refills | Status: DC

## 2020-06-09 NOTE — Telephone Encounter (Signed)
Refill has been sent to the pharmacy for refills.  

## 2020-06-12 DIAGNOSIS — G4730 Sleep apnea, unspecified: Secondary | ICD-10-CM | POA: Diagnosis not present

## 2020-06-12 DIAGNOSIS — G4731 Primary central sleep apnea: Secondary | ICD-10-CM | POA: Diagnosis not present

## 2020-06-12 DIAGNOSIS — G4737 Central sleep apnea in conditions classified elsewhere: Secondary | ICD-10-CM | POA: Diagnosis not present

## 2020-06-14 ENCOUNTER — Other Ambulatory Visit: Payer: Self-pay | Admitting: Family Medicine

## 2020-07-11 ENCOUNTER — Other Ambulatory Visit: Payer: Self-pay | Admitting: Family Medicine

## 2020-07-11 ENCOUNTER — Encounter: Payer: Self-pay | Admitting: Family Medicine

## 2020-07-11 ENCOUNTER — Other Ambulatory Visit: Payer: Self-pay

## 2020-07-11 ENCOUNTER — Ambulatory Visit (INDEPENDENT_AMBULATORY_CARE_PROVIDER_SITE_OTHER): Payer: Medicare Other | Admitting: Family Medicine

## 2020-07-11 VITALS — BP 142/86 | HR 92 | Temp 99.7°F | Resp 18 | Ht 69.0 in | Wt 301.0 lb

## 2020-07-11 DIAGNOSIS — J069 Acute upper respiratory infection, unspecified: Secondary | ICD-10-CM

## 2020-07-11 MED ORDER — AMOXICILLIN-POT CLAVULANATE 875-125 MG PO TABS: 1.0000 | ORAL_TABLET | Freq: Two times a day (BID) | ORAL | 0 refills | Status: DC

## 2020-07-11 MED ORDER — GUAIFENESIN-CODEINE 100-10 MG/5ML PO SOLN: 10.0000 mL | Freq: Four times a day (QID) | ORAL | 0 refills | Status: DC | PRN

## 2020-07-11 NOTE — Progress Notes (Signed)
Subjective:    Patient ID: William Howell, male    DOB: 1941/01/10, 80 y.o.   MRN: [id number]  HPI  Patient is a very pleasant 80 year old Caucasian male with a history of diabetes mellitus type 2 as well as asthma who presents with an upper respiratory infection.  He states that symptoms began on Friday.  Symptoms include head congestion, rhinorrhea, low-grade fevers, body aches, and nonproductive cough.  He states that he wants to "catch it before it gets worse".  Several members of his family have recently had a stomach virus.  He denies any chest pain or pleurisy or hemoptysis.  He denies any purulent sputum.  He does have a low-grade temperature here of 99.7.  Thankfully his lungs are completely clear to auscultation bilaterally.  There is no wheezes crackles or rails at all.  His oxygen saturation is 100% on room air.  He denies any sinus pain although he does have significant head congestion.  Past Medical History:  Diagnosis Date  . Allergy   . Asthma   . Diabetes mellitus type II, controlled (HCC)   . GERD (gastroesophageal reflux disease)    pt states he takes omeprazole but doesnt have GERD  . Hypertension   . Obesity   . Sleep apnea    uses CPAP   Past Surgical History:  Procedure Laterality Date  . CHOLECYSTECTOMY    . DENTAL SURGERY     Current Outpatient Medications on File Prior to Visit  Medication Sig Dispense Refill  . Albuterol Sulfate (VENTOLIN HFA IN) Inhale into the lungs.    Marland Kitchen aspirin EC 81 MG tablet Take 81 mg daily by mouth.    . B Complex Vitamins (B COMPLEX 1 PO) Take by mouth.    . Calcium Carb-Cholecalciferol (CALCIUM 1000 + D PO) Take 1 tablet by mouth 2 (two) times daily.    Burman Blacksmith ALLERGY RELIEF, CETIRIZINE, 10 MG tablet Take 1 tablet by mouth once daily 90 tablet 0  . glucosamine-chondroitin 500-400 MG tablet Take 1 tablet 2 (two) times daily by mouth.    . levofloxacin (LEVAQUIN) 500 MG tablet Take 1 tablet (500 mg total) by mouth daily. 7 tablet  0  . losartan (COZAAR) 50 MG tablet Take 1 tablet by mouth once daily 90 tablet 0  . metFORMIN (GLUCOPHAGE) 500 MG tablet TAKE 1 TABLET BY MOUTH TWICE DAILY WITH A MEAL. NEEDS OV BEFORE ANYMORE REFILLS. 60 tablet 0  . Multiple Vitamins-Minerals (MULTIVITAMIN WITH MINERALS) tablet Take 1 tablet daily by mouth.    . Omega-3 Fatty Acids (FISH OIL) 1000 MG CAPS Take 1 capsule by mouth daily.    Marland Kitchen omeprazole (PRILOSEC) 40 MG capsule Take 1 capsule by mouth once daily 90 capsule 0  . predniSONE (DELTASONE) 20 MG tablet 3 tabs poqday 1-2, 2 tabs poqday 3-4, 1 tab poqday 5-6 12 tablet 0  . SYMBICORT 160-4.5 MCG/ACT inhaler Inhale 2 puffs into the lungs 2 (two) times daily. 10.2 g 6  . tamsulosin (FLOMAX) 0.4 MG CAPS capsule Take 1 capsule by mouth once daily 90 capsule 0  . VENTOLIN HFA 108 (90 Base) MCG/ACT inhaler INHALE 1 TO 2 PUFFS BY MOUTH EVERY 6 HOURS AS NEEDED 18 g 5  . zafirlukast (ACCOLATE) 20 MG tablet TAKE 1 TABLET BY MOUTH TWICE DAILY BEFORE A MEAL 60 tablet 0   Current Facility-Administered Medications on File Prior to Visit  Medication Dose Route Frequency Provider Last Rate Last Admin  . 0.9 %  sodium  chloride infusion  500 mL Intravenous Once Iva Boop, MD       No Known Allergies Social History   Socioeconomic History  . Marital status: Married    Spouse name: Not on file  . Number of children: Not on file  . Years of education: Not on file  . Highest education level: Not on file  Occupational History  . Not on file  Tobacco Use  . Smoking status: Never Smoker  . Smokeless tobacco: Former Neurosurgeon    Types: Engineer, drilling  . Vaping Use: Never used  Substance and Sexual Activity  . Alcohol use: Yes    Comment: occasional   . Drug use: No  . Sexual activity: Not on file  Other Topics Concern  . Not on file  Social History Narrative  . Not on file   Social Determinants of Health   Financial Resource Strain: Not on file  Food Insecurity: Not on file   Transportation Needs: Not on file  Physical Activity: Not on file  Stress: Not on file  Social Connections: Not on file  Intimate Partner Violence: Not on file      Review of Systems  All other systems reviewed and are negative.      Objective:   Physical Exam Vitals reviewed.  Constitutional:      General: He is not in acute distress.    Appearance: He is well-developed. He is not diaphoretic.  HENT:     Head: Normocephalic and atraumatic.     Right Ear: Tympanic membrane, ear canal and external ear normal.     Left Ear: Tympanic membrane, ear canal and external ear normal.     Nose: Congestion and rhinorrhea present.     Right Sinus: No maxillary sinus tenderness or frontal sinus tenderness.     Left Sinus: No maxillary sinus tenderness or frontal sinus tenderness.     Mouth/Throat:     Mouth: Mucous membranes are moist.     Pharynx: Oropharynx is clear. No oropharyngeal exudate.  Eyes:     General: No scleral icterus.       Right eye: No discharge.        Left eye: No discharge.     Conjunctiva/sclera: Conjunctivae normal.     Pupils: Pupils are equal, round, and reactive to light.  Neck:     Thyroid: No thyromegaly.     Vascular: No JVD.     Trachea: No tracheal deviation.  Cardiovascular:     Rate and Rhythm: Normal rate and regular rhythm.     Heart sounds: Normal heart sounds. No murmur heard. No friction rub. No gallop.   Pulmonary:     Effort: Pulmonary effort is normal. Prolonged expiration present. No accessory muscle usage or respiratory distress.     Breath sounds: Decreased air movement present. No stridor. No wheezing, rhonchi or rales.  Chest:     Chest wall: No tenderness.  Abdominal:     General: Bowel sounds are normal. There is no distension.     Palpations: Abdomen is soft. There is no mass.     Tenderness: There is no abdominal tenderness. There is no guarding or rebound.  Musculoskeletal:        General: Normal range of motion.      Cervical back: Normal range of motion and neck supple.  Lymphadenopathy:     Cervical: No cervical adenopathy.  Skin:    General: Skin is warm.     Coloration:  Skin is not pale.     Findings: No erythema or rash.  Neurological:     Mental Status: He is alert and oriented to person, place, and time.     Cranial Nerves: No cranial nerve deficit.     Motor: No abnormal muscle tone.     Coordination: Coordination normal.     Deep Tendon Reflexes: Reflexes are normal and symmetric.  Psychiatric:        Behavior: Behavior normal.        Thought Content: Thought content normal.        Judgment: Judgment normal.           Assessment & Plan:  Viral upper respiratory tract infection - Plan: SARS-COV-2 RNA,(COVID-19) QUAL NAAT  I believe the patient has a viral upper respiratory infection.  I recommended using Coricidin for head congestion and rhinorrhea.  He can use Mucinex as needed for cough.  If the cough gets worse, he can replace the Mucinex with Robitussin-AC 10 mg of codeine every 6-8 hours as needed for cough.  Recommended tincture of time as I believe this is most likely a virus.  I did check the patient for Covid today.  Await the results of his Covid test.  If he develops sinus pain over next 3 to 4 days, he can start taking Augmentin 875 mg twice daily for a sinus infection.  However his exam and presentation today is more consistent with a virus and therefore I recommend that he hold off on the medication for now

## 2020-07-12 LAB — SARS-COV-2 RNA,(COVID-19) QUALITATIVE NAAT: SARS CoV2 RNA: NOT DETECTED

## 2020-07-20 ENCOUNTER — Other Ambulatory Visit: Payer: Self-pay

## 2020-07-20 ENCOUNTER — Ambulatory Visit: Payer: Medicare Other | Admitting: Internal Medicine

## 2020-07-20 VITALS — BP 130/84 | HR 77 | Ht 69.0 in | Wt 297.0 lb

## 2020-07-20 DIAGNOSIS — R0600 Dyspnea, unspecified: Secondary | ICD-10-CM | POA: Diagnosis not present

## 2020-07-20 DIAGNOSIS — I4510 Unspecified right bundle-branch block: Secondary | ICD-10-CM

## 2020-07-20 DIAGNOSIS — I10 Essential (primary) hypertension: Secondary | ICD-10-CM

## 2020-07-20 DIAGNOSIS — I351 Nonrheumatic aortic (valve) insufficiency: Secondary | ICD-10-CM

## 2020-07-20 NOTE — Progress Notes (Signed)
OFFICE CONSULT NOTE  Chief Complaint:  Longstanding dyspnea  Primary Care Physician: Donita Brooks, MD  HPI:  William Howell is a 80 y.o. male who is being seen today for the evaluation of DOE and fatigue at the request of Dr. Kendrick Fries. William Howell has a history of morbid obesity, type 2 diabetes, hypertension, asthma and obstructive sleep apnea on CPAP. He reports at least 3 or 4 years of significant dyspnea on exertion. His weight is been fairly stable although at one time in the past he lost more than 50 pounds on a commercial weight loss diet but regained the weight. Recently he's been working with his pulmonologist for shortness of breath. He says in general is improved however he is concerned about possible underlying coronary disease. Certainly has multiple coronary risk factors and a family history of heart attack in his father who also had pulmonary disease and it may been related to using his inhaler. He also has a sister who died of heart attack. William Howell feels short of breath when exerting himself, particularly walking up stairs or doing some physical activities. He denies any chest pain, pressure, heaviness or associated symptoms.  10/07/2016  William Howell returns today for follow-up. He underwent a nuclear stress test which was negative for ischemia but showed a fixed inferior defect. This could potentially be scar however suspect that likely bowel attenuation artifact given his obesity. The echocardiogram showed normal inferior wall motion in that territory without any evidence of scar. Echo did also showed normal systolic function, mild LVH, normal diastolic dysfunction and mild aortic insufficiency. I don't believe the degree of aortic insufficiency that he has would explain his shortness of breath. Rather think his symptoms are multifactorial including obesity, asthma and perhaps upper airway resistance / poor diaphragmatic excursion.  04/21/2017  William Howell was seen  today in follow-up.  He denies any worsening shortness of breath or chest pain.  He is down a few pounds and has lost more weight but gained some back during the holidays.  He previously had lost about 60 pounds with commercial weight loss program, but gained the weight back.  He says he is not currently interested in a referral to weight loss program because he is busy managing to farms.  He understands he needs to continue to lose weight.  06/01/2018  William Howell is seen today in follow-up.  His shortness of breath is stable.  Unfortunately weight is stable and excessive.  He is not been able to lose any additional weight.  He denies any chest pain.  He did have some mild aortic insufficiency on echo which does not seem to symptomatically change.  He reports compliance with CPAP but has not had it reassessed recently.  He is followed by pulmonary and may need to see a sleep specialist there to reassess his equipment.  He saw his PCP in January.  Labs in October showed total cholesterol 130, HDL 37, LDL 75 and triglycerides 93.  Hemoglobin A1c was 6.2.  07/19/2019  William Howell returns today for follow-up.  He reports a stable dyspnea on exertion.  Weight unfortunately has not come down significantly.  He continues to work on 2 different forms.  He contracted COVID-19 in December and did have monoclonal antibody infusion and outpatient treatment but was not ill enough to be admitted.  That being said he was significantly sick for at least a week.  He seems to have pretty much recovered from that.  He is  compliant with his CPAP.  He is due for lab work which we will obtain today since he is fasting.  He has a follow-up with his PCP next week.  07/20/2020  William Howell is seen today in follow-up.  He reports shortness of breath.  He says this is fairly stable and I feel is probably related to weight.  He does have a history of some aortic insufficiency by echo in 2018.  This should be reassessed.  He reports  he has lost a little weight recently.  At one point he lost a significant amount of weight with a commercial weight loss program about 50 pounds but then gained most of it back.  He says he needs to work in combination with his wife on that since she does most of the cooking.  PMHx:  Past Medical History:  Diagnosis Date  . Allergy   . Asthma   . Diabetes mellitus type II, controlled (HCC)   . GERD (gastroesophageal reflux disease)    pt states he takes omeprazole but doesnt have GERD  . Hypertension   . Obesity   . Sleep apnea    uses CPAP    Past Surgical History:  Procedure Laterality Date  . CHOLECYSTECTOMY    . DENTAL SURGERY      FAMHx:  Family History  Problem Relation Age of Onset  . Heart disease Father   . Heart attack Father   . Heart attack Sister   . Colon cancer Neg Hx   . Colon polyps Neg Hx   . Esophageal cancer Neg Hx   . Rectal cancer Neg Hx   . Stomach cancer Neg Hx   . Pancreatic cancer Neg Hx   . Liver cancer Neg Hx   . Prostate cancer Neg Hx     SOCHx:   reports that he has never smoked. He has quit using smokeless tobacco.  His smokeless tobacco use included chew. He reports current alcohol use. He reports that he does not use drugs.  ALLERGIES:  No Known Allergies  ROS: Pertinent items noted in HPI and remainder of comprehensive ROS otherwise negative.  HOME MEDS: Current Outpatient Medications on File Prior to Visit  Medication Sig Dispense Refill  . Albuterol Sulfate (VENTOLIN HFA IN) Inhale into the lungs.    Marland Kitchen amoxicillin-clavulanate (AUGMENTIN) 875-125 MG tablet Take 1 tablet by mouth 2 (two) times daily. 20 tablet 0  . aspirin EC 81 MG tablet Take 81 mg daily by mouth.    . B Complex Vitamins (B COMPLEX 1 PO) Take by mouth.    . Calcium Carb-Cholecalciferol (CALCIUM 1000 + D PO) Take 1 tablet by mouth 2 (two) times daily.    . cetirizine (ZYRTEC) 10 MG tablet Take 1 tablet by mouth once daily 90 tablet 0  . glucosamine-chondroitin  500-400 MG tablet Take 1 tablet 2 (two) times daily by mouth.    Marland Kitchen guaiFENesin-codeine 100-10 MG/5ML syrup Take 10 mLs by mouth every 6 (six) hours as needed for cough. 120 mL 0  . levofloxacin (LEVAQUIN) 500 MG tablet Take 1 tablet (500 mg total) by mouth daily. 7 tablet 0  . losartan (COZAAR) 50 MG tablet Take 1 tablet by mouth once daily 90 tablet 0  . metFORMIN (GLUCOPHAGE) 500 MG tablet TAKE 1 TABLET BY MOUTH TWICE DAILY WITH A MEAL. NEEDS OV BEFORE ANYMORE REFILLS. 60 tablet 0  . Multiple Vitamins-Minerals (MULTIVITAMIN WITH MINERALS) tablet Take 1 tablet daily by mouth.    . Omega-3  Fatty Acids (FISH OIL) 1000 MG CAPS Take 1 capsule by mouth daily.    Marland Kitchen omeprazole (PRILOSEC) 40 MG capsule Take 1 capsule by mouth once daily 90 capsule 0  . predniSONE (DELTASONE) 20 MG tablet 3 tabs poqday 1-2, 2 tabs poqday 3-4, 1 tab poqday 5-6 12 tablet 0  . SYMBICORT 160-4.5 MCG/ACT inhaler Inhale 2 puffs into the lungs 2 (two) times daily. 10.2 g 6  . tamsulosin (FLOMAX) 0.4 MG CAPS capsule Take 1 capsule by mouth once daily 90 capsule 0  . VENTOLIN HFA 108 (90 Base) MCG/ACT inhaler INHALE 1 TO 2 PUFFS BY MOUTH EVERY 6 HOURS AS NEEDED 18 g 5  . zafirlukast (ACCOLATE) 20 MG tablet TAKE 1 TABLET BY MOUTH TWICE DAILY BEFORE A MEAL 60 tablet 0   Current Facility-Administered Medications on File Prior to Visit  Medication Dose Route Frequency Provider Last Rate Last Admin  . 0.9 %  sodium chloride infusion  500 mL Intravenous Once Iva Boop, MD        LABS/IMAGING: No results found for this or any previous visit (from the past 48 hour(s)). No results found.  LIPID PANEL:    Component Value Date/Time   CHOL 141 02/21/2020 0815   CHOL 145 07/19/2019 0841   TRIG 132 02/21/2020 0815   HDL 41 02/21/2020 0815   HDL 51 07/19/2019 0841   CHOLHDL 3.4 02/21/2020 0815   VLDL 20 02/19/2016 0918   LDLCALC 77 02/21/2020 0815    WEIGHTS: Wt Readings from Last 3 Encounters:  07/20/20 297 lb (134.7  kg)  07/11/20 (!) 301 lb (136.5 kg)  05/15/20 297 lb 9.6 oz (135 kg)    VITALS: BP 130/84   Pulse 77   Ht 5\' 9"  (1.753 m)   Wt 297 lb (134.7 kg)   SpO2 96%   BMI 43.86 kg/m   EXAM: General appearance: alert, no distress and morbidly obese Neck: no carotid bruit, no JVD and thyroid not enlarged, symmetric, no tenderness/mass/nodules Lungs: clear to auscultation bilaterally Heart: regular rate and rhythm, S1, S2 normal and diastolic murmur: early diastolic 2/6, blowing at 2nd right intercostal space Abdomen: soft, non-tender; bowel sounds normal; no masses,  no organomegaly Extremities: extremities normal, atraumatic, no cyanosis or edema Pulses: 2+ and symmetric Skin: Skin color, texture, turgor normal. No rashes or lesions Neurologic: Grossly normal Psych: Pleasant  EKG: Sinus rhythm with PACs at 77, RBBB-personally reviewed  ASSESSMENT: 1. Stable dyspnea on exertion and fatigue - low risk stress test, fixed inferior attenuation artifact (09/2016) 2. LVEF 60-65%, mild AI, mild LVH (09/2016) 3. Morbid obesity 4. RBBB 5. Hypertension 6. Dyslipidemia 7. Asthma 8. OSA on CPAP 9. DM2  PLAN: 1.   William Howell does report some persistent shortness of breath.  This has been an issue in the past.  He had stress testing which was low risk in 2018 an echo which showed some AI.  I will go ahead and repeat the echo today to make sure that there is not any worsening AI although his murmur is not necessarily any louder.  I still feel that weight is a significant issue.  Blood pressures controlled.  His cholesterol is good.  He is compliant with CPAP.  Follow-up annually sooner as necessary.  2019, MD, [facility], FACP  [place]  [facility]  Medical Director of the [facility]  Cardi[facility] Reduction [facility] Diplomate of the American Board of Clinical Lipidology Attending Cardiologist  Direct Dial: [phone]  Fax: [phone]  Website:   [email or web address]  Lissandro Dilorenzo C Tylicia Sherman 07/20/2020, 9:17 AM

## 2020-07-20 NOTE — Patient Instructions (Signed)
Medication Instructions:  Your physician recommends that you continue on your current medications as directed. Please refer to the Current Medication list given to you today.  *If you need a refill on your cardiac medications before your next appointment, please call your pharmacy*   Lab Work: NONE If you have labs (blood work) drawn today and your tests are completely normal, you will receive your results only by: Marland Kitchen MyChart Message (if you have MyChart) OR . A paper copy in the mail If you have any lab test that is abnormal or we need to change your treatment, we will call you to review the results.   Testing/Procedures: Your physician has requested that you have an echocardiogram. Echocardiography is a painless test that uses sound waves to create images of your heart. It provides your doctor with information about the size and shape of your heart and how well your heart's chambers and valves are working. This procedure takes approximately one hour. There are no restrictions for this procedure. -- [address] - [address] Floor   Follow-Up: At [facility], you and your health needs are our priority.  As part of our continuing mission to provide you with exceptional heart care, we have created designated Provider Care Teams.  These Care Teams include your primary Cardiologist (physician) and Advanced Practice Providers (APPs -  Physician Assistants and Nurse Practitioners) who all work together to provide you with the care you need, when you need it.  We recommend signing up for the patient portal called "MyChart".  Sign up information is provided on this After Visit Summary.  MyChart is used to connect with patients for Virtual Visits (Telemedicine).  Patients are able to view lab/test results, encounter notes, upcoming appointments, etc.  Non-urgent messages can be sent to your provider as well.   To learn more about what you can do with MyChart, go to [email or web address].     Your next appointment:   12 month(s)  The format for your next appointment:   In Person  Provider:   You may see Dr. Rennis Golden or one of the following Advanced Practice Providers on your designated Care Team:    Azalee Course, PA-C  Micah Flesher, [place] or   Judy Pimple, [place]    Other Instructions

## 2020-07-31 ENCOUNTER — Other Ambulatory Visit: Payer: Self-pay | Admitting: Family Medicine

## 2020-08-07 NOTE — Progress Notes (Signed)
Subjective:   William Howell is a 80 y.o. male who presents for Medicare Annual/Subsequent preventive examination.  Review of Systems    N/A  Cardiac Risk Factors include: advanced age (>57men, >26 women);male gender;diabetes mellitus;hypertension;dyslipidemia;obesity (BMI >30kg/m2)     Objective:    Today's Vitals   08/11/20 0822  BP: 120/64  Pulse: 84  Temp: 97.9 F (36.6 C)  TempSrc: Oral  SpO2: 96%  Weight: 295 lb (133.8 kg)  Height: 5\' 8"  (1.727 m)   Body mass index is 44.85 kg/m.  Advanced Directives 08/11/2020 01/16/2019 07/07/2017  Does Patient Have a Medical Advance Directive? No No No  Would patient like information on creating a medical advance directive? No - Patient declined - -    Current Medications (verified) Outpatient Encounter Medications as of 08/11/2020  Medication Sig  . Albuterol Sulfate (VENTOLIN HFA IN) Inhale into the lungs.  08/13/2020 aspirin EC 81 MG tablet Take 81 mg daily by mouth.  . B Complex Vitamins (B COMPLEX 1 PO) Take by mouth.  . Calcium Carb-Cholecalciferol (CALCIUM 1000 + D PO) Take 1 tablet by mouth 2 (two) times daily.  . cetirizine (ZYRTEC) 10 MG tablet Take 1 tablet by mouth once daily  . glucosamine-chondroitin 500-400 MG tablet Take 1 tablet 2 (two) times daily by mouth.  . losartan (COZAAR) 50 MG tablet Take 1 tablet by mouth once daily  . metFORMIN (GLUCOPHAGE) 500 MG tablet TAKE 1 TABLET BY MOUTH TWICE DAILY WITH A MEAL. NEEDS OV BEFORE ANYMORE REFILLS.  . Multiple Vitamins-Minerals (MULTIVITAMIN WITH MINERALS) tablet Take 1 tablet daily by mouth.  . Omega-3 Fatty Acids (FISH OIL) 1000 MG CAPS Take 1 capsule by mouth daily.  . SYMBICORT 160-4.5 MCG/ACT inhaler Inhale 2 puffs into the lungs 2 (two) times daily.  . tamsulosin (FLOMAX) 0.4 MG CAPS capsule Take 1 capsule by mouth once daily  . VENTOLIN HFA 108 (90 Base) MCG/ACT inhaler INHALE 1 TO 2 PUFFS BY MOUTH EVERY 6 HOURS AS NEEDED  . zafirlukast (ACCOLATE) 20 MG tablet TAKE 1  TABLET BY MOUTH TWICE DAILY BEFORE A MEAL  . omeprazole (PRILOSEC) 40 MG capsule Take 1 capsule by mouth once daily (Patient not taking: Reported on 08/11/2020)  . [DISCONTINUED] amoxicillin-clavulanate (AUGMENTIN) 875-125 MG tablet Take 1 tablet by mouth 2 (two) times daily.  . [DISCONTINUED] guaiFENesin-codeine 100-10 MG/5ML syrup Take 10 mLs by mouth every 6 (six) hours as needed for cough.  . [DISCONTINUED] levofloxacin (LEVAQUIN) 500 MG tablet Take 1 tablet (500 mg total) by mouth daily.  . [DISCONTINUED] predniSONE (DELTASONE) 20 MG tablet 3 tabs poqday 1-2, 2 tabs poqday 3-4, 1 tab poqday 5-6   Facility-Administered Encounter Medications as of 08/11/2020  Medication  . 0.9 %  sodium chloride infusion    Allergies (verified) Patient has no known allergies.   History: Past Medical History:  Diagnosis Date  . Allergy   . Asthma   . Diabetes mellitus type II, controlled (HCC)   . GERD (gastroesophageal reflux disease)    pt states he takes omeprazole but doesnt have GERD  . Hypertension   . Obesity   . Sleep apnea    uses CPAP   Past Surgical History:  Procedure Laterality Date  . CHOLECYSTECTOMY    . DENTAL SURGERY     Family History  Problem Relation Age of Onset  . Heart disease Father   . Heart attack Father   . Heart attack Sister   . Colon cancer Neg Hx   .  Colon polyps Neg Hx   . Esophageal cancer Neg Hx   . Rectal cancer Neg Hx   . Stomach cancer Neg Hx   . Pancreatic cancer Neg Hx   . Liver cancer Neg Hx   . Prostate cancer Neg Hx    Social History   Socioeconomic History  . Marital status: Married    Spouse name: Not on file  . Number of children: Not on file  . Years of education: Not on file  . Highest education level: Not on file  Occupational History  . Not on file  Tobacco Use  . Smoking status: Never Smoker  . Smokeless tobacco: Former Neurosurgeon    Types: Engineer, drilling  . Vaping Use: Never used  Substance and Sexual Activity  . Alcohol  use: Yes    Comment: occasional   . Drug use: No  . Sexual activity: Not on file  Other Topics Concern  . Not on file  Social History Narrative  . Not on file   Social Determinants of Health   Financial Resource Strain: Low Risk   . Difficulty of Paying Living Expenses: Not hard at all  Food Insecurity: No Food Insecurity  . Worried About Programme researcher, broadcasting/film/video in the Last Year: Never true  . Ran Out of Food in the Last Year: Never true  Transportation Needs: No Transportation Needs  . Lack of Transportation (Medical): No  . Lack of Transportation (Non-Medical): No  Physical Activity: Inactive  . Days of Exercise per Week: 0 days  . Minutes of Exercise per Session: 0 min  Stress: No Stress Concern Present  . Feeling of Stress : Not at all  Social Connections: Moderately Isolated  . Frequency of Communication with Friends and Family: More than three times a week  . Frequency of Social Gatherings with Friends and Family: More than three times a week  . Attends Religious Services: Never  . Active Member of Clubs or Organizations: No  . Attends Banker Meetings: Never  . Marital Status: Married    Tobacco Counseling Counseling given: Not Answered   Clinical Intake:  Pre-visit preparation completed: Yes  Pain : No/denies pain     Nutritional Risks: None Diabetes: Yes CBG done?: No Did pt. bring in CBG monitor from home?: No  How often do you need to have someone help you when you read instructions, pamphlets, or other written materials from your doctor or pharmacy?: 1 - Never  Diabetic?Yes Nutrition Risk Assessment:  Has the patient had any N/V/D within the last 2 months?  No  Does the patient have any non-healing wounds?  No  Has the patient had any unintentional weight loss or weight gain?  No   Diabetes:  Is the patient diabetic?  Yes  If diabetic, was a CBG obtained today?  No  Did the patient bring in their glucometer from home?  No  How often  do you monitor your CBG's? Patient states does not check glucose at home .   Financial Strains and Diabetes Management:  Are you having any financial strains with the device, your supplies or your medication? No .  Does the patient want to be seen by Chronic Care Management for management of their diabetes?  No  Would the patient like to be referred to a Nutritionist or for Diabetic Management?  No   Diabetic Exams:  Diabetic Eye Exam: Completed 02/07/2020 Diabetic Foot Exam: Overdue, Pt has been advised about the importance  in completing this exam. Pt is scheduled for diabetic foot exam on next scheduled office visit .   Interpreter Needed?: No  Information entered by :: SCrews,LPN   Activities of Daily Living In your present state of health, do you have any difficulty performing the following activities: 08/11/2020  Hearing? N  Vision? N  Difficulty concentrating or making decisions? N  Walking or climbing stairs? Y  Comment gets fatigued when climbing stairs  Dressing or bathing? N  Doing errands, shopping? N  Preparing Food and eating ? N  Using the Toilet? N  In the past six months, have you accidently leaked urine? Y  Do you have problems with loss of bowel control? N  Managing your Medications? N  Managing your Finances? N  Housekeeping or managing your Housekeeping? N  Some recent data might be hidden    Patient Care Team: Donita BrooksPickard, Warren T, MD as PCP - General (Family Medicine) Rennis GoldenHilty, Lisette AbuKenneth C, MD as PCP - Cardiology (Cardiology) Rennis GoldenHilty, Lisette AbuKenneth C, MD as Consulting Physician (Cardiology)  Indicate any recent Medical Services you may have received from other than [facility] providers in the past year (date may be approximate).     Assessment:   This is a routine wellness examination for William GoldRussell.  Hearing/Vision screen  Hearing Screening   125Hz  250Hz  500Hz  1000Hz  2000Hz  3000Hz  4000Hz  6000Hz  8000Hz   Right ear:           Left ear:           Vision Screening  Comments: Patient gets eye exams once per year. Recently had bilateral cataract extractions. Currently wears glasses   Dietary issues and exercise activities discussed: Current Exercise Habits: Home exercise routine, Type of exercise: strength training/weights, Time (Minutes): 10, Frequency (Times/Week): 7, Weekly Exercise (Minutes/Week): 70, Intensity: Mild, Exercise limited by: None identified  Goals    . DIET - INCREASE WATER INTAKE    . Weight (lb) < 275 lb (124.7 kg)      Depression Screen PHQ 2/9 Scores 08/11/2020 07/26/2019 05/04/2018 05/09/2017 05/09/2017  PHQ - 2 Score 0 0 0 0 0    Fall Risk Fall Risk  08/11/2020 07/26/2019 05/04/2018 05/09/2017 05/09/2017  Falls in the past year? 1 0 0 No No  Number falls in past yr: 0 - - - -  Injury with Fall? 0 - - - -  Risk for fall due to : No Fall Risks - - - -  Follow up Falls evaluation completed;Falls prevention discussed Falls evaluation completed Falls evaluation completed - -    FALL RISK PREVENTION PERTAINING TO THE HOME:  Any stairs in or around the home? No  If so, are there any without handrails? No  Home free of loose throw rugs in walkways, pet beds, electrical cords, etc? Yes  Adequate lighting in your home to reduce risk of falls? Yes   ASSISTIVE DEVICES UTILIZED TO PREVENT FALLS:  Life alert? No  Use of a cane, walker or w/c? No  Grab bars in the bathroom? Yes  Shower chair or bench in shower? Yes  Elevated toilet seat or a handicapped toilet? Yes   TIMED UP AND GO:  Was the test performed? Yes .  Length of time to ambulate 10 feet: 4 sec.   Gait steady and fast without use of assistive device  Cognitive Function:   Normal cognitive status assessed by direct observation by this Nurse Health Advisor. No abnormalities found.        Immunizations Immunization History  Administered  Date(s) Administered  . Fluad Quad(high Dose 65+) 01/04/2019, 03/27/2020  . Influenza, High Dose Seasonal PF 02/17/2017  .  Influenza,inj,Quad PF,6+ Mos 02/10/2014, 03/14/2015, 01/09/2016, 02/12/2018  . Influenza-Unspecified 02/12/2018  . PFIZER(Purple Top)SARS-COV-2 Vaccination 07/10/2019, 08/04/2019  . Pneumococcal Conjugate-13 07/04/2014  . Pneumococcal Polysaccharide-23 12/26/2011  . Td 12/26/2011  . Tdap 12/26/2011  . Zoster 02/10/2014    TDAP status: Up to date  Flu Vaccine status: Up to date  Pneumococcal vaccine status: Up to date  Covid-19 vaccine status: Completed vaccines  Qualifies for Shingles Vaccine? Yes   Zostavax completed Yes   Shingrix Completed?: No.    Education has been provided regarding the importance of this vaccine. Patient has been advised to call insurance company to determine out of pocket expense if they have not yet received this vaccine. Advised may also receive vaccine at local pharmacy or Health Dept. Verbalized acceptance and understanding.  Screening Tests Health Maintenance  Topic Date Due  . Hepatitis C Screening  Never done  . COVID-19 Vaccine (3 - Booster for Pfizer series) 02/03/2020  . FOOT EXAM  07/25/2020  . HEMOGLOBIN A1C  08/20/2020  . INFLUENZA VACCINE  11/13/2020  . OPHTHALMOLOGY EXAM  02/06/2021  . TETANUS/TDAP  12/25/2021  . PNA vac Low Risk Adult  Completed  . HPV VACCINES  Aged Out    Health Maintenance  Health Maintenance Due  Topic Date Due  . Hepatitis C Screening  Never done  . COVID-19 Vaccine (3 - Booster for Pfizer series) 02/03/2020  . FOOT EXAM  07/25/2020    Colorectal cancer screening: No longer required.   Lung Cancer Screening: (Low Dose CT Chest recommended if Age 103-80 years, 30 pack-year currently smoking OR have quit w/in 15years.) does not qualify.   Lung Cancer Screening Referral: N/A   Additional Screening:  Hepatitis C Screening: does qualify;  Vision Screening: Recommended annual ophthalmology exams for early detection of glaucoma and other disorders of the eye. Is the patient up to date with their annual eye  exam?  Yes  Who is the provider or what is the name of the office in which the patient attends annual eye exams? Dr. Randon Goldsmith  If pt is not established with a provider, would they like to be referred to a provider to establish care? No .   Dental Screening: Recommended annual dental exams for proper oral hygiene  Community Resource Referral / Chronic Care Management: CRR required this visit?  No   CCM required this visit?  No      Plan:     I have personally reviewed and noted the following in the patient's chart:   . Medical and social history . Use of alcohol, tobacco or illicit drugs  . Current medications and supplements . Functional ability and status . Nutritional status . Physical activity . Advanced directives . List of other physicians . Hospitalizations, surgeries, and ER visits in previous 12 months . Vitals . Screenings to include cognitive, depression, and falls . Referrals and appointments  In addition, I have reviewed and discussed with patient certain preventive protocols, quality metrics, and best practice recommendations. A written personalized care plan for preventive services as well as general preventive health recommendations were provided to patient.     Theodora Blow, LPN   07/04/252   Nurse Notes: None

## 2020-08-11 ENCOUNTER — Other Ambulatory Visit: Payer: Self-pay

## 2020-08-11 ENCOUNTER — Ambulatory Visit (INDEPENDENT_AMBULATORY_CARE_PROVIDER_SITE_OTHER): Payer: Medicare Other

## 2020-08-11 VITALS — BP 120/64 | HR 84 | Temp 97.9°F | Ht 68.0 in | Wt 295.0 lb

## 2020-08-11 DIAGNOSIS — Z0000 Encounter for general adult medical examination without abnormal findings: Secondary | ICD-10-CM | POA: Diagnosis not present

## 2020-08-11 NOTE — Patient Instructions (Signed)
William Howell , Thank you for taking time to come for your Medicare Wellness Visit. I appreciate your ongoing commitment to your health goals. Please review the following plan we discussed and let me know if I can assist you in the future.   Screening recommendations/referrals: Colonoscopy: No longer required  Recommended yearly ophthalmology/optometry visit for glaucoma screening and checkup Recommended yearly dental visit for hygiene and checkup  Vaccinations: Influenza vaccine: up to date, next due fall 2022  Pneumococcal vaccine: Completed series  Tdap vaccine: Up to date, next due 12/25/2021 Shingles vaccine: Currently due for Shingrix, if you would like to receive we recommend that you do so at your local pharmacy as it is less expensive     Advanced directives: Advance directive discussed with you today. Even though you declined this today please call our office should you change your mind and we can give you the proper paperwork for you to fill out.   Conditions/risks identified: None   Next appointment: None   Preventive Care 65 Years and Older, Male Preventive care refers to lifestyle choices and visits with your health care provider that can promote health and wellness. What does preventive care include?  A yearly physical exam. This is also called an annual well check.  Dental exams once or twice a year.  Routine eye exams. Ask your health care provider how often you should have your eyes checked.  Personal lifestyle choices, including:  Daily care of your teeth and gums.  Regular physical activity.  Eating a healthy diet.  Avoiding tobacco and drug use.  Limiting alcohol use.  Practicing safe sex.  Taking low doses of aspirin every day.  Taking vitamin and mineral supplements as recommended by your health care provider. What happens during an annual well check? The services and screenings done by your health care provider during your annual well check will  depend on your age, overall health, lifestyle risk factors, and family history of disease. Counseling  Your health care provider may ask you questions about your:  Alcohol use.  Tobacco use.  Drug use.  Emotional well-being.  Home and relationship well-being.  Sexual activity.  Eating habits.  History of falls.  Memory and ability to understand (cognition).  Work and work Astronomer. Screening  You may have the following tests or measurements:  Height, weight, and BMI.  Blood pressure.  Lipid and cholesterol levels. These may be checked every 5 years, or more frequently if you are over 80 years old.  Skin check.  Lung cancer screening. You may have this screening every year starting at age 30 if you have a 30-pack-year history of smoking and currently smoke or have quit within the past 15 years.  Fecal occult blood test (FOBT) of the stool. You may have this test every year starting at age 55.  Flexible sigmoidoscopy or colonoscopy. You may have a sigmoidoscopy every 5 years or a colonoscopy every 10 years starting at age 79.  Prostate cancer screening. Recommendations will vary depending on your family history and other risks.  Hepatitis C blood test.  Hepatitis B blood test.  Sexually transmitted disease (STD) testing.  Diabetes screening. This is done by checking your blood sugar (glucose) after you have not eaten for a while (fasting). You may have this done every 1-3 years.  Abdominal aortic aneurysm (AAA) screening. You may need this if you are a current or former smoker.  Osteoporosis. You may be screened starting at age 4 if you are at  high risk. Talk with your health care provider about your test results, treatment options, and if necessary, the need for more tests. Vaccines  Your health care provider may recommend certain vaccines, such as:  Influenza vaccine. This is recommended every year.  Tetanus, diphtheria, and acellular pertussis (Tdap,  Td) vaccine. You may need a Td booster every 10 years.  Zoster vaccine. You may need this after age 61.  Pneumococcal 13-valent conjugate (PCV13) vaccine. One dose is recommended after age 10.  Pneumococcal polysaccharide (PPSV23) vaccine. One dose is recommended after age 32. Talk to your health care provider about which screenings and vaccines you need and how often you need them. This information is not intended to replace advice given to you by your health care provider. Make sure you discuss any questions you have with your health care provider. Document Released: 04/28/2015 Document Revised: 12/20/2015 Document Reviewed: 01/31/2015 Elsevier Interactive Patient Education  2017 [employer] Prevention in the Home Falls can cause injuries. They can happen to people of all ages. There are many things you can do to make your home safe and to help prevent falls. What can I do on the outside of my home?  Regularly fix the edges of walkways and driveways and fix any cracks.  Remove anything that might make you trip as you walk through a door, such as a raised step or threshold.  Trim any bushes or trees on the path to your home.  Use bright outdoor lighting.  Clear any walking paths of anything that might make someone trip, such as rocks or tools.  Regularly check to see if handrails are loose or broken. Make sure that both sides of any steps have handrails.  Any raised decks and porches should have guardrails on the edges.  Have any leaves, snow, or ice cleared regularly.  Use sand or salt on walking paths during winter.  Clean up any spills in your garage right away. This includes oil or grease spills. What can I do in the bathroom?  Use night lights.  Install grab bars by the toilet and in the tub and shower. Do not use towel bars as grab bars.  Use non-skid mats or decals in the tub or shower.  If you need to sit down in the shower, use a plastic, non-slip  stool.  Keep the floor dry. Clean up any water that spills on the floor as soon as it happens.  Remove soap buildup in the tub or shower regularly.  Attach bath mats securely with double-sided non-slip rug tape.  Do not have throw rugs and other things on the floor that can make you trip. What can I do in the bedroom?  Use night lights.  Make sure that you have a light by your bed that is easy to reach.  Do not use any sheets or blankets that are too big for your bed. They should not hang down onto the floor.  Have a firm chair that has side arms. You can use this for support while you get dressed.  Do not have throw rugs and other things on the floor that can make you trip. What can I do in the kitchen?  Clean up any spills right away.  Avoid walking on wet floors.  Keep items that you use a lot in easy-to-reach places.  If you need to reach something above you, use a strong step stool that has a grab bar.  Keep electrical cords out of the  way.  Do not use floor polish or wax that makes floors slippery. If you must use wax, use non-skid floor wax.  Do not have throw rugs and other things on the floor that can make you trip. What can I do with my stairs?  Do not leave any items on the stairs.  Make sure that there are handrails on both sides of the stairs and use them. Fix handrails that are broken or loose. Make sure that handrails are as long as the stairways.  Check any carpeting to make sure that it is firmly attached to the stairs. Fix any carpet that is loose or worn.  Avoid having throw rugs at the top or bottom of the stairs. If you do have throw rugs, attach them to the floor with carpet tape.  Make sure that you have a light switch at the top of the stairs and the bottom of the stairs. If you do not have them, ask someone to add them for you. What else can I do to help prevent falls?  Wear shoes that:  Do not have high heels.  Have rubber bottoms.  Are  comfortable and fit you well.  Are closed at the toe. Do not wear sandals.  If you use a stepladder:  Make sure that it is fully opened. Do not climb a closed stepladder.  Make sure that both sides of the stepladder are locked into place.  Ask someone to hold it for you, if possible.  Clearly mark and make sure that you can see:  Any grab bars or handrails.  First and last steps.  Where the edge of each step is.  Use tools that help you move around (mobility aids) if they are needed. These include:  Canes.  Walkers.  Scooters.  Crutches.  Turn on the lights when you go into a dark area. Replace any light bulbs as soon as they burn out.  Set up your furniture so you have a clear path. Avoid moving your furniture around.  If any of your floors are uneven, fix them.  If there are any pets around you, be aware of where they are.  Review your medicines with your doctor. Some medicines can make you feel dizzy. This can increase your chance of falling. Ask your doctor what other things that you can do to help prevent falls. This information is not intended to replace advice given to you by your health care provider. Make sure you discuss any questions you have with your health care provider. Document Released: 01/26/2009 Document Revised: 09/07/2015 Document Reviewed: 05/06/2014 Elsevier Interactive Patient Education  2017 [employer].

## 2020-08-22 ENCOUNTER — Other Ambulatory Visit: Payer: Self-pay | Admitting: Family Medicine

## 2020-08-28 ENCOUNTER — Ambulatory Visit (HOSPITAL_COMMUNITY): Payer: Medicare Other | Attending: Cardiology

## 2020-08-28 ENCOUNTER — Other Ambulatory Visit: Payer: Self-pay

## 2020-08-28 DIAGNOSIS — I351 Nonrheumatic aortic (valve) insufficiency: Secondary | ICD-10-CM | POA: Diagnosis not present

## 2020-08-28 DIAGNOSIS — R0600 Dyspnea, unspecified: Secondary | ICD-10-CM | POA: Diagnosis not present

## 2020-08-28 LAB — ECHOCARDIOGRAM COMPLETE
Area-P 1/2: 3.33 cm2
S' Lateral: 2.6 cm

## 2020-08-28 MED ORDER — PERFLUTREN LIPID MICROSPHERE
1.0000 mL | INTRAVENOUS | Status: AC | PRN
  Administered 2020-08-28: 3 mL via INTRAVENOUS

## 2020-09-08 ENCOUNTER — Other Ambulatory Visit: Payer: Self-pay | Admitting: *Deleted

## 2020-09-08 DIAGNOSIS — I712 Thoracic aortic aneurysm, without rupture, unspecified: Secondary | ICD-10-CM

## 2020-09-08 DIAGNOSIS — I77810 Thoracic aortic ectasia: Secondary | ICD-10-CM

## 2020-09-10 ENCOUNTER — Other Ambulatory Visit: Payer: Self-pay | Admitting: Family Medicine

## 2020-09-14 DIAGNOSIS — G4731 Primary central sleep apnea: Secondary | ICD-10-CM | POA: Diagnosis not present

## 2020-09-14 DIAGNOSIS — G4730 Sleep apnea, unspecified: Secondary | ICD-10-CM | POA: Diagnosis not present

## 2020-09-14 DIAGNOSIS — G4737 Central sleep apnea in conditions classified elsewhere: Secondary | ICD-10-CM | POA: Diagnosis not present

## 2020-09-19 ENCOUNTER — Other Ambulatory Visit: Payer: Self-pay | Admitting: Family Medicine

## 2020-09-28 ENCOUNTER — Ambulatory Visit
Admission: RE | Admit: 2020-09-28 | Discharge: 2020-09-28 | Disposition: A | Discharge status: Home / Self Care | Payer: Medicare Other | Source: Ambulatory Visit | Attending: Internal Medicine | Admitting: Internal Medicine

## 2020-09-28 DIAGNOSIS — I712 Thoracic aortic aneurysm, without rupture, unspecified: Secondary | ICD-10-CM

## 2020-09-28 DIAGNOSIS — I700 Atherosclerosis of aorta: Secondary | ICD-10-CM | POA: Diagnosis not present

## 2020-09-28 DIAGNOSIS — I77810 Thoracic aortic ectasia: Secondary | ICD-10-CM

## 2020-09-28 MED ORDER — IOPAMIDOL (ISOVUE-370) INJECTION 76%
50.0000 mL | Freq: Once | INTRAVENOUS | Status: AC | PRN
  Administered 2020-09-28: 50 mL via INTRAVENOUS

## 2020-10-03 ENCOUNTER — Other Ambulatory Visit: Payer: Self-pay | Admitting: Family Medicine

## 2020-10-17 ENCOUNTER — Other Ambulatory Visit: Payer: Self-pay | Admitting: Family Medicine

## 2020-10-19 ENCOUNTER — Other Ambulatory Visit: Payer: Self-pay | Admitting: Internal Medicine

## 2020-10-19 DIAGNOSIS — I77810 Thoracic aortic ectasia: Secondary | ICD-10-CM

## 2020-10-19 DIAGNOSIS — I712 Thoracic aortic aneurysm, without rupture, unspecified: Secondary | ICD-10-CM

## 2020-11-06 ENCOUNTER — Other Ambulatory Visit: Payer: Self-pay | Admitting: Family Medicine

## 2020-11-21 ENCOUNTER — Other Ambulatory Visit: Payer: Self-pay | Admitting: Family Medicine

## 2020-11-21 ENCOUNTER — Telehealth: Payer: Self-pay | Admitting: *Deleted

## 2020-11-21 MED ORDER — PREDNISONE 20 MG PO TABS: ORAL_TABLET | ORAL | 0 refills | Status: DC

## 2020-11-21 MED ORDER — AZITHROMYCIN 250 MG PO TABS: ORAL_TABLET | ORAL | 0 refills | Status: DC

## 2020-11-21 NOTE — Telephone Encounter (Signed)
Call placed to patient and patient wife made aware.  

## 2020-11-21 NOTE — Telephone Encounter (Signed)
Received call from patient wife Elnita Maxwell.   Reports that patient has had cough, congestion, sinus drainage, low grade fever and malaise x2 weeks.   States that he was tested for COVID and noted negative last week.   Reports that chest congestion and nasal congestion are green in color.   Patient has tried OTC medications with no relief.   Please advise.

## 2020-12-04 ENCOUNTER — Other Ambulatory Visit: Payer: Self-pay | Admitting: Family Medicine

## 2021-01-02 ENCOUNTER — Other Ambulatory Visit: Payer: Self-pay | Admitting: Family Medicine

## 2021-01-17 ENCOUNTER — Other Ambulatory Visit: Payer: Self-pay | Admitting: Family Medicine

## 2021-02-06 ENCOUNTER — Other Ambulatory Visit: Payer: Self-pay | Admitting: Family Medicine

## 2021-02-06 DIAGNOSIS — G4737 Central sleep apnea in conditions classified elsewhere: Secondary | ICD-10-CM | POA: Diagnosis not present

## 2021-02-06 DIAGNOSIS — G4730 Sleep apnea, unspecified: Secondary | ICD-10-CM | POA: Diagnosis not present

## 2021-02-06 DIAGNOSIS — G4731 Primary central sleep apnea: Secondary | ICD-10-CM | POA: Diagnosis not present

## 2021-02-14 ENCOUNTER — Other Ambulatory Visit: Payer: Self-pay | Admitting: Pulmonary Disease

## 2021-03-07 ENCOUNTER — Other Ambulatory Visit: Payer: Self-pay | Admitting: Family Medicine

## 2021-03-07 ENCOUNTER — Other Ambulatory Visit: Payer: Self-pay

## 2021-03-13 ENCOUNTER — Other Ambulatory Visit: Payer: Self-pay | Admitting: Family Medicine

## 2021-03-15 ENCOUNTER — Other Ambulatory Visit: Payer: Self-pay | Admitting: Family Medicine

## 2021-03-17 ENCOUNTER — Other Ambulatory Visit: Payer: Self-pay | Admitting: Family Medicine

## 2021-04-12 ENCOUNTER — Other Ambulatory Visit: Payer: Self-pay | Admitting: Family Medicine

## 2021-04-18 ENCOUNTER — Other Ambulatory Visit: Payer: Self-pay | Admitting: Family Medicine

## 2021-04-18 NOTE — Telephone Encounter (Signed)
Left message advising patient to schedule follow up appointment for medication management.

## 2021-05-01 ENCOUNTER — Encounter: Payer: Self-pay | Admitting: Family Medicine

## 2021-05-01 ENCOUNTER — Other Ambulatory Visit: Payer: Self-pay

## 2021-05-01 ENCOUNTER — Ambulatory Visit (INDEPENDENT_AMBULATORY_CARE_PROVIDER_SITE_OTHER): Payer: Medicare Other | Admitting: Family Medicine

## 2021-05-01 VITALS — BP 138/78 | HR 71 | Temp 97.9°F | Resp 18 | Ht 68.0 in | Wt 280.0 lb

## 2021-05-01 DIAGNOSIS — R7303 Prediabetes: Secondary | ICD-10-CM | POA: Diagnosis not present

## 2021-05-01 DIAGNOSIS — Z125 Encounter for screening for malignant neoplasm of prostate: Secondary | ICD-10-CM

## 2021-05-01 DIAGNOSIS — Z79899 Other long term (current) drug therapy: Secondary | ICD-10-CM | POA: Diagnosis not present

## 2021-05-01 MED ORDER — SYMBICORT 160-4.5 MCG/ACT IN AERO: 2.0000 | INHALATION_SPRAY | Freq: Two times a day (BID) | RESPIRATORY_TRACT | 5 refills | Status: DC

## 2021-05-01 MED ORDER — LOSARTAN POTASSIUM 50 MG PO TABS: 50.0000 mg | ORAL_TABLET | Freq: Every day | ORAL | 3 refills | Status: DC

## 2021-05-01 NOTE — Progress Notes (Signed)
Subjective:    Patient ID: William Howell, male    DOB: 15-Aug-1940, 81 y.o.   MRN: [id number]  HPI Patient is a very pleasant 81 year old Caucasian gentleman who presents today to get a refill on his medication.  We asked him to come in because its been since November 2021 that we checked his lab work.  At that time he had borderline diabetes with an A1c of 6.8.  He also has significant elevations in his PSA with a PSA of 4.57.  At that time I recommended: "PSA has increased.  It is gone from 3-4.57.  Recommend urology consultation once he is feeling better to rule out prostate cancer prior to starting finasteride to help with symptoms.  Once patient has the lab result, then order the urology consult.  If I put the order in first they will call him before he is made aware." However for what ever reason this did not transpire.  No one has rechecked his PSA since.  Past Medical History:  Diagnosis Date   Allergy    Asthma    Diabetes mellitus type II, controlled (HCC)    GERD (gastroesophageal reflux disease)    pt states he takes omeprazole but doesnt have GERD   Hypertension    Obesity    Sleep apnea    uses CPAP   Past Surgical History:  Procedure Laterality Date   CHOLECYSTECTOMY     DENTAL SURGERY     Current Outpatient Medications on File Prior to Visit  Medication Sig Dispense Refill   aspirin EC 81 MG tablet Take 81 mg daily by mouth.     B Complex Vitamins (B COMPLEX 1 PO) Take by mouth.     Calcium Carb-Cholecalciferol (CALCIUM 1000 + D PO) Take 1 tablet by mouth 2 (two) times daily.     cetirizine (EQ ALLERGY RELIEF, CETIRIZINE,) 10 MG tablet Take 1 tablet by mouth once daily 30 tablet 0   glucosamine-chondroitin 500-400 MG tablet Take 1 tablet 2 (two) times daily by mouth.     losartan (COZAAR) 50 MG tablet Take 1 tablet by mouth once daily 90 tablet 0   metFORMIN (GLUCOPHAGE) 500 MG tablet TAKE 1 TABLET BY MOUTH TWICE DAILY WITH  A  MEAL. PT NEEDS OFFICE VISIT FOR  FURTHER REFILLS. 60 tablet 0   Multiple Vitamins-Minerals (MULTIVITAMIN WITH MINERALS) tablet Take 1 tablet daily by mouth.     Omega-3 Fatty Acids (FISH OIL) 1000 MG CAPS Take 1 capsule by mouth daily.     omeprazole (PRILOSEC) 40 MG capsule Take 1 capsule by mouth once daily 90 capsule 3   SYMBICORT 160-4.5 MCG/ACT inhaler Inhale 2 puffs by mouth twice daily 11 g 2   tamsulosin (FLOMAX) 0.4 MG CAPS capsule Take 1 capsule by mouth once daily 90 capsule 3   VENTOLIN HFA 108 (90 Base) MCG/ACT inhaler INHALE 1 TO 2 PUFFS BY MOUTH EVERY 6 HOURS AS NEEDED 18 g 0   zafirlukast (ACCOLATE) 20 MG tablet TAKE 1 TABLET BY MOUTH TWICE DAILY BEFORE A MEAL 60 tablet 0   Current Facility-Administered Medications on File Prior to Visit  Medication Dose Route Frequency Provider Last Rate Last Admin   0.9 %  sodium chloride infusion  500 mL Intravenous Once Iva Boop, MD       No Known Allergies Social History   Socioeconomic History   Marital status: Married    Spouse name: Not on file   Number of children: Not on file  Years of education: Not on file   Highest education level: Not on file  Occupational History   Not on file  Tobacco Use   Smoking status: Never   Smokeless tobacco: Former    Types: Designer, multimediaChew  Vaping Use   Vaping Use: Never used  Substance and Sexual Activity   Alcohol use: Yes    Comment: occasional    Drug use: No   Sexual activity: Not on file  Other Topics Concern   Not on file  Social History Narrative   Not on file   Social Determinants of Health   Financial Resource Strain: Low Risk    Difficulty of Paying Living Expenses: Not hard at all  Food Insecurity: No Food Insecurity   Worried About Programme researcher, broadcasting/film/videounning Out of Food in the Last Year: Never true   Ran Out of Food in the Last Year: Never true  Transportation Needs: No Transportation Needs   Lack of Transportation (Medical): No   Lack of Transportation (Non-Medical): No  Physical Activity: Inactive   Days of  Exercise per Week: 0 days   Minutes of Exercise per Session: 0 min  Stress: No Stress Concern Present   Feeling of Stress : Not at all  Social Connections: Moderately Isolated   Frequency of Communication with Friends and Family: More than three times a week   Frequency of Social Gatherings with Friends and Family: More than three times a week   Attends Religious Services: Never   Database administratorActive Member of Clubs or Organizations: No   Attends Engineer, structuralClub or Organization Meetings: Never   Marital Status: Married  Catering managerntimate Partner Violence: Not At Risk   Fear of Current or Ex-Partner: No   Emotionally Abused: No   Physically Abused: No   Sexually Abused: No      Review of Systems  All other systems reviewed and are negative.     Objective:   Physical Exam Vitals reviewed.  Constitutional:      General: He is not in acute distress.    Appearance: He is well-developed. He is not diaphoretic.  HENT:     Head: Normocephalic and atraumatic.     Right Ear: External ear normal.     Left Ear: External ear normal.     Nose: Nose normal.     Mouth/Throat:     Pharynx: No oropharyngeal exudate.  Eyes:     General: No scleral icterus.       Right eye: No discharge.        Left eye: No discharge.     Conjunctiva/sclera: Conjunctivae normal.     Pupils: Pupils are equal, round, and reactive to light.  Neck:     Thyroid: No thyromegaly.     Vascular: No JVD.     Trachea: No tracheal deviation.  Cardiovascular:     Rate and Rhythm: Normal rate and regular rhythm.     Heart sounds: Normal heart sounds. No murmur heard.   No friction rub. No gallop.  Pulmonary:     Effort: Pulmonary effort is normal. Prolonged expiration present. No accessory muscle usage or respiratory distress.     Breath sounds: Decreased air movement present. No stridor. Examination of the right-upper field reveals wheezing. Examination of the left-upper field reveals wheezing. Examination of the right-lower field reveals  wheezing and rhonchi. Examination of the left-lower field reveals wheezing. Wheezing and rhonchi present. No rales.  Chest:     Chest wall: No tenderness.  Abdominal:     General: Bowel  sounds are normal. There is no distension.     Palpations: Abdomen is soft. There is no mass.     Tenderness: There is no abdominal tenderness. There is no guarding or rebound.  Musculoskeletal:        General: Normal range of motion.     Cervical back: Normal range of motion and neck supple.  Lymphadenopathy:     Cervical: No cervical adenopathy.  Skin:    General: Skin is warm.     Coloration: Skin is not pale.     Findings: No erythema or rash.  Neurological:     Mental Status: He is alert and oriented to person, place, and time.     Cranial Nerves: No cranial nerve deficit.     Motor: No abnormal muscle tone.     Coordination: Coordination normal.     Deep Tendon Reflexes: Reflexes are normal and symmetric.  Psychiatric:        Behavior: Behavior normal.        Thought Content: Thought content normal.        Judgment: Judgment normal.          Assessment & Plan:  Prediabetes - Plan: Hemoglobin A1c, CBC with Differential/Platelet, Lipid panel, Microalbumin, urine, COMPLETE METABOLIC PANEL WITH GFR  Prostate cancer screening - Plan: PSA  Morbid obesity (HCC) Biggest concern is his PSA.  I will repeat that today.  If persistently elevated, I would recommend urology consultation for possible prostate cancer.  Blood pressure today is acceptable at 138/78.  Monitor hemoglobin A1c, CBC, lipid panel, and urine microalbumin given his history of prediabetes.  His goal hemoglobin A1c is less than 6.5.  We will gladly refill his medication the morning.

## 2021-05-02 LAB — CBC WITH DIFFERENTIAL/PLATELET
Absolute Monocytes: 765 cells/uL (ref 200–950)
Basophils Absolute: 63 cells/uL (ref 0–200)
Basophils Relative: 0.7 %
Eosinophils Absolute: 486 cells/uL (ref 15–500)
Eosinophils Relative: 5.4 %
HCT: 41.3 % (ref 38.5–50.0)
Hemoglobin: 13.5 g/dL (ref 13.2–17.1)
Lymphs Abs: 2691 cells/uL (ref 850–3900)
MCH: 29.6 pg (ref 27.0–33.0)
MCHC: 32.7 g/dL (ref 32.0–36.0)
MCV: 90.6 fL (ref 80.0–100.0)
MPV: 9.8 fL (ref 7.5–12.5)
Monocytes Relative: 8.5 %
Neutro Abs: 4995 cells/uL (ref 1500–7800)
Neutrophils Relative %: 55.5 %
Platelets: 298 10*3/uL (ref 140–400)
RBC: 4.56 10*6/uL (ref 4.20–5.80)
RDW: 12.5 % (ref 11.0–15.0)
Total Lymphocyte: 29.9 %
WBC: 9 10*3/uL (ref 3.8–10.8)

## 2021-05-02 LAB — COMPLETE METABOLIC PANEL WITH GFR
AG Ratio: 1.8 (calc) (ref 1.0–2.5)
ALT: 14 U/L (ref 9–46)
AST: 16 U/L (ref 10–35)
Albumin: 4.4 g/dL (ref 3.6–5.1)
Alkaline phosphatase (APISO): 70 U/L (ref 35–144)
BUN/Creatinine Ratio: 15 (calc) (ref 6–22)
BUN: 22 mg/dL (ref 7–25)
CO2: 31 mmol/L (ref 20–32)
Calcium: 9.8 mg/dL (ref 8.6–10.3)
Chloride: 104 mmol/L (ref 98–110)
Creat: 1.49 mg/dL — ABNORMAL HIGH (ref 0.70–1.22)
Globulin: 2.4 g/dL (calc) (ref 1.9–3.7)
Glucose, Bld: 110 mg/dL — ABNORMAL HIGH (ref 65–99)
Potassium: 5 mmol/L (ref 3.5–5.3)
Sodium: 142 mmol/L (ref 135–146)
Total Bilirubin: 0.4 mg/dL (ref 0.2–1.2)
Total Protein: 6.8 g/dL (ref 6.1–8.1)
eGFR: 47 mL/min/{1.73_m2} — ABNORMAL LOW (ref >=60)

## 2021-05-02 LAB — HEMOGLOBIN A1C
Hgb A1c MFr Bld: 6.1 % of total Hgb — ABNORMAL HIGH (ref <5.7)
Mean Plasma Glucose: 128 mg/dL
eAG (mmol/L): 7.1 mmol/L

## 2021-05-02 LAB — LIPID PANEL
Cholesterol: 133 mg/dL (ref <200)
HDL: 48 mg/dL (ref >=40)
LDL Cholesterol (Calc): 67 mg/dL (calc)
Non-HDL Cholesterol (Calc): 85 mg/dL (calc) (ref <130)
Total CHOL/HDL Ratio: 2.8 (calc) (ref <5.0)
Triglycerides: 98 mg/dL (ref <150)

## 2021-05-02 LAB — MICROALBUMIN, URINE: Microalb, Ur: 0.3 mg/dL

## 2021-05-02 LAB — PSA: PSA: 4.35 ng/mL — ABNORMAL HIGH (ref <=4.00)

## 2021-05-03 ENCOUNTER — Other Ambulatory Visit: Payer: Self-pay

## 2021-05-03 DIAGNOSIS — Z87898 Personal history of other specified conditions: Secondary | ICD-10-CM

## 2021-05-16 ENCOUNTER — Other Ambulatory Visit: Payer: Self-pay | Admitting: Family Medicine

## 2021-06-08 ENCOUNTER — Other Ambulatory Visit: Payer: Self-pay | Admitting: Internal Medicine

## 2021-06-08 DIAGNOSIS — I77810 Thoracic aortic ectasia: Secondary | ICD-10-CM

## 2021-06-08 DIAGNOSIS — Z01812 Encounter for preprocedural laboratory examination: Secondary | ICD-10-CM

## 2021-06-25 DIAGNOSIS — E119 Type 2 diabetes mellitus without complications: Secondary | ICD-10-CM | POA: Diagnosis not present

## 2021-06-25 DIAGNOSIS — H35373 Puckering of macula, bilateral: Secondary | ICD-10-CM | POA: Diagnosis not present

## 2021-06-25 DIAGNOSIS — Z961 Presence of intraocular lens: Secondary | ICD-10-CM | POA: Diagnosis not present

## 2021-06-25 LAB — HM DIABETES EYE EXAM

## 2021-06-26 DIAGNOSIS — H524 Presbyopia: Secondary | ICD-10-CM | POA: Diagnosis not present

## 2021-07-20 DIAGNOSIS — G4730 Sleep apnea, unspecified: Secondary | ICD-10-CM | POA: Diagnosis not present

## 2021-07-20 DIAGNOSIS — G4731 Primary central sleep apnea: Secondary | ICD-10-CM | POA: Diagnosis not present

## 2021-08-15 ENCOUNTER — Telehealth: Payer: Self-pay | Admitting: Family Medicine

## 2021-08-15 NOTE — Telephone Encounter (Signed)
Left message for patient to call back and schedule Medicare Annual Wellness Visit (AWV) in office.  ? ?If not able to come in office, please offer to do virtually or by telephone.  Left office number and my jabber #[phone]. ? ?Last AWV:08/11/2020 ? ?Please schedule at anytime with Nurse Health Advisor. ?  ?

## 2021-09-24 ENCOUNTER — Telehealth: Payer: Self-pay | Admitting: Internal Medicine

## 2021-09-24 DIAGNOSIS — I77810 Thoracic aortic ectasia: Secondary | ICD-10-CM

## 2021-09-24 DIAGNOSIS — Z01812 Encounter for preprocedural laboratory examination: Secondary | ICD-10-CM

## 2021-09-24 NOTE — Telephone Encounter (Signed)
Left detailed message for pt letting him know that labs orders are placed. Advised pt to call back with questions.

## 2021-09-24 NOTE — Telephone Encounter (Signed)
Taryn with [facility] requesting a BMET be ordered for the patient so he can get it done prior to his CT scheduled for Friday.

## 2021-09-26 DIAGNOSIS — I77810 Thoracic aortic ectasia: Secondary | ICD-10-CM | POA: Diagnosis not present

## 2021-09-26 LAB — BASIC METABOLIC PANEL
BUN/Creatinine Ratio: 18 (ref 10–24)
BUN: 25 mg/dL (ref 8–27)
CO2: 28 mmol/L (ref 20–29)
Calcium: 9.7 mg/dL (ref 8.6–10.2)
Chloride: 104 mmol/L (ref 96–106)
Creatinine, Ser: 1.42 mg/dL — ABNORMAL HIGH (ref 0.76–1.27)
Glucose: 104 mg/dL — ABNORMAL HIGH (ref 70–99)
Potassium: 5.3 mmol/L — ABNORMAL HIGH (ref 3.5–5.2)
Sodium: 144 mmol/L (ref 134–144)
eGFR: 50 mL/min/{1.73_m2} — ABNORMAL LOW (ref >59)

## 2021-09-28 ENCOUNTER — Ambulatory Visit (INDEPENDENT_AMBULATORY_CARE_PROVIDER_SITE_OTHER)
Admission: RE | Admit: 2021-09-28 | Discharge: 2021-09-28 | Disposition: A | Discharge status: Home / Self Care | Payer: Medicare Other | Source: Ambulatory Visit | Attending: Internal Medicine | Admitting: Internal Medicine

## 2021-09-28 DIAGNOSIS — I7121 Aneurysm of the ascending aorta, without rupture: Secondary | ICD-10-CM | POA: Diagnosis not present

## 2021-09-28 DIAGNOSIS — I712 Thoracic aortic aneurysm, without rupture, unspecified: Secondary | ICD-10-CM

## 2021-09-28 DIAGNOSIS — J984 Other disorders of lung: Secondary | ICD-10-CM | POA: Diagnosis not present

## 2021-09-28 DIAGNOSIS — I77810 Thoracic aortic ectasia: Secondary | ICD-10-CM | POA: Diagnosis not present

## 2021-09-28 MED ORDER — IOHEXOL 350 MG/ML SOLN
100.0000 mL | Freq: Once | INTRAVENOUS | Status: AC | PRN
  Administered 2021-09-28: 80 mL via INTRAVENOUS

## 2021-10-01 ENCOUNTER — Other Ambulatory Visit: Payer: Self-pay | Admitting: *Deleted

## 2021-10-01 DIAGNOSIS — I712 Thoracic aortic aneurysm, without rupture, unspecified: Secondary | ICD-10-CM

## 2021-10-04 NOTE — Patient Instructions (Incomplete)
William Howell , Thank you for taking time to come for your Medicare Wellness Visit. I appreciate your ongoing commitment to your health goals. Please review the following plan we discussed and let me know if I can assist you in the future.   Screening recommendations/referrals: Colonoscopy: No longer required.  Recommended yearly ophthalmology/optometry visit for glaucoma screening and checkup Recommended yearly dental visit for hygiene and checkup  Vaccinations: Influenza vaccine: Done 03/27/2020 Repeat annually  Pneumococcal vaccine: Done 07/04/2014 and 12/26/2011 Tdap vaccine: Due every 10 years. Shingles vaccine: Done 02/10/2014. Shingrix available at your local pharmacy.   Covid-19: Done 08/04/2019 and 07/10/2019  Advanced directives: ***  Conditions/risks identified: Aim for 30 minutes of exercise or brisk walking, 6-8 glasses of water, and 5 servings of fruits and vegetables each day.   Next appointment: Follow up in one year for your annual wellness visit. 2024.  Preventive Care 81 Years and Older, Male  Preventive care refers to lifestyle choices and visits with your health care provider that can promote health and wellness. What does preventive care include? A yearly physical exam. This is also called an annual well check. Dental exams once or twice a year. Routine eye exams. Ask your health care provider how often you should have your eyes checked. Personal lifestyle choices, including: Daily care of your teeth and gums. Regular physical activity. Eating a healthy diet. Avoiding tobacco and drug use. Limiting alcohol use. Practicing safe sex. Taking low doses of aspirin every day. Taking vitamin and mineral supplements as recommended by your health care provider. What happens during an annual well check? The services and screenings done by your health care provider during your annual well check will depend on your age, overall health, lifestyle risk factors, and family  history of disease. Counseling  Your health care provider may ask you questions about your: Alcohol use. Tobacco use. Drug use. Emotional well-being. Home and relationship well-being. Sexual activity. Eating habits. History of falls. Memory and ability to understand (cognition). Work and work Astronomer. Screening  You may have the following tests or measurements: Height, weight, and BMI. Blood pressure. Lipid and cholesterol levels. These may be checked every 5 years, or more frequently if you are over 13 years old. Skin check. Lung cancer screening. You may have this screening every year starting at age 81 if you have a 30-pack-year history of smoking and currently smoke or have quit within the past 15 years. Fecal occult blood test (FOBT) of the stool. You may have this test every year starting at age 13. Flexible sigmoidoscopy or colonoscopy. You may have a sigmoidoscopy every 5 years or a colonoscopy every 10 years starting at age 13. Prostate cancer screening. Recommendations will vary depending on your family history and other risks. Hepatitis C blood test. Hepatitis B blood test. Sexually transmitted disease (STD) testing. Diabetes screening. This is done by checking your blood sugar (glucose) after you have not eaten for a while (fasting). You may have this done every 1-3 years. Abdominal aortic aneurysm (AAA) screening. You may need this if you are a current or former smoker. Osteoporosis. You may be screened starting at age 23 if you are at high risk. Talk with your health care provider about your test results, treatment options, and if necessary, the need for more tests. Vaccines  Your health care provider may recommend certain vaccines, such as: Influenza vaccine. This is recommended every year. Tetanus, diphtheria, and acellular pertussis (Tdap, Td) vaccine. You may need a Td booster every  10 years. Zoster vaccine. You may need this after age 33. Pneumococcal  13-valent conjugate (PCV13) vaccine. One dose is recommended after age 83. Pneumococcal polysaccharide (PPSV23) vaccine. One dose is recommended after age 33. Talk to your health care provider about which screenings and vaccines you need and how often you need them. This information is not intended to replace advice given to you by your health care provider. Make sure you discuss any questions you have with your health care provider. Document Released: 04/28/2015 Document Revised: 12/20/2015 Document Reviewed: 01/31/2015 Elsevier Interactive Patient Education  2017 Elim Prevention in the Home Falls can cause injuries. They can happen to people of all ages. There are many things you can do to make your home safe and to help prevent falls. What can I do on the outside of my home? Regularly fix the edges of walkways and driveways and fix any cracks. Remove anything that might make you trip as you walk through a door, such as a raised step or threshold. Trim any bushes or trees on the path to your home. Use bright outdoor lighting. Clear any walking paths of anything that might make someone trip, such as rocks or tools. Regularly check to see if handrails are loose or broken. Make sure that both sides of any steps have handrails. Any raised decks and porches should have guardrails on the edges. Have any leaves, snow, or ice cleared regularly. Use sand or salt on walking paths during winter. Clean up any spills in your garage right away. This includes oil or grease spills. What can I do in the bathroom? Use night lights. Install grab bars by the toilet and in the tub and shower. Do not use towel bars as grab bars. Use non-skid mats or decals in the tub or shower. If you need to sit down in the shower, use a plastic, non-slip stool. Keep the floor dry. Clean up any water that spills on the floor as soon as it happens. Remove soap buildup in the tub or shower regularly. Attach  bath mats securely with double-sided non-slip rug tape. Do not have throw rugs and other things on the floor that can make you trip. What can I do in the bedroom? Use night lights. Make sure that you have a light by your bed that is easy to reach. Do not use any sheets or blankets that are too big for your bed. They should not hang down onto the floor. Have a firm chair that has side arms. You can use this for support while you get dressed. Do not have throw rugs and other things on the floor that can make you trip. What can I do in the kitchen? Clean up any spills right away. Avoid walking on wet floors. Keep items that you use a lot in easy-to-reach places. If you need to reach something above you, use a strong step stool that has a grab bar. Keep electrical cords out of the way. Do not use floor polish or wax that makes floors slippery. If you must use wax, use non-skid floor wax. Do not have throw rugs and other things on the floor that can make you trip. What can I do with my stairs? Do not leave any items on the stairs. Make sure that there are handrails on both sides of the stairs and use them. Fix handrails that are broken or loose. Make sure that handrails are as long as the stairways. Check any carpeting to make  sure that it is firmly attached to the stairs. Fix any carpet that is loose or worn. Avoid having throw rugs at the top or bottom of the stairs. If you do have throw rugs, attach them to the floor with carpet tape. Make sure that you have a light switch at the top of the stairs and the bottom of the stairs. If you do not have them, ask someone to add them for you. What else can I do to help prevent falls? Wear shoes that: Do not have high heels. Have rubber bottoms. Are comfortable and fit you well. Are closed at the toe. Do not wear sandals. If you use a stepladder: Make sure that it is fully opened. Do not climb a closed stepladder. Make sure that both sides of the  stepladder are locked into place. Ask someone to hold it for you, if possible. Clearly mark and make sure that you can see: Any grab bars or handrails. First and last steps. Where the edge of each step is. Use tools that help you move around (mobility aids) if they are needed. These include: Canes. Walkers. Scooters. Crutches. Turn on the lights when you go into a dark area. Replace any light bulbs as soon as they burn out. Set up your furniture so you have a clear path. Avoid moving your furniture around. If any of your floors are uneven, fix them. If there are any pets around you, be aware of where they are. Review your medicines with your doctor. Some medicines can make you feel dizzy. This can increase your chance of falling. Ask your doctor what other things that you can do to help prevent falls. This information is not intended to replace advice given to you by your health care provider. Make sure you discuss any questions you have with your health care provider. Document Released: 01/26/2009 Document Revised: 09/07/2015 Document Reviewed: 05/06/2014 Elsevier Interactive Patient Education  2017 [employer].

## 2021-10-05 ENCOUNTER — Ambulatory Visit (INDEPENDENT_AMBULATORY_CARE_PROVIDER_SITE_OTHER): Payer: Medicare Other

## 2021-10-05 VITALS — BP 122/68 | HR 67 | Ht 68.0 in | Wt 300.2 lb

## 2021-10-05 DIAGNOSIS — Z0000 Encounter for general adult medical examination without abnormal findings: Secondary | ICD-10-CM | POA: Diagnosis not present

## 2021-10-17 ENCOUNTER — Other Ambulatory Visit: Payer: Self-pay

## 2021-10-29 ENCOUNTER — Other Ambulatory Visit: Payer: Self-pay | Admitting: Family Medicine

## 2021-10-30 NOTE — Telephone Encounter (Signed)
Requested Prescriptions  Pending Prescriptions Disp Refills  . SYMBICORT 160-4.5 MCG/ACT inhaler [Pharmacy Med Name: Symbicort 160-4.5 MCG/ACT Inhalation Aerosol] 11 g 0    Sig: Inhale 2 puffs by mouth twice daily     Pulmonology:  Combination Products Passed - 10/29/2021  9:36 AM      Passed - Valid encounter within last 12 months    Recent Outpatient Visits          6 months ago Prediabetes   [facility] Tanya Nones, Priscille Heidelberg, MD   1 year ago Viral upper respiratory tract infection   [facility] Tanya Nones Priscille Heidelberg, MD   1 year ago Need for prophylactic vaccination and inoculation against influenza   [facility] [name], Priscille Heidelberg, MD   1 year ago Prediabetes   [facility] Donita Brooks, MD   2 years ago Dysuria   [facility] [name], Priscille Heidelberg, MD

## 2021-11-25 ENCOUNTER — Other Ambulatory Visit: Payer: Self-pay | Admitting: Family Medicine

## 2021-12-25 DIAGNOSIS — G4731 Primary central sleep apnea: Secondary | ICD-10-CM | POA: Diagnosis not present

## 2021-12-25 DIAGNOSIS — G4733 Obstructive sleep apnea (adult) (pediatric): Secondary | ICD-10-CM | POA: Diagnosis not present

## 2021-12-25 DIAGNOSIS — G4730 Sleep apnea, unspecified: Secondary | ICD-10-CM | POA: Diagnosis not present

## 2022-01-22 ENCOUNTER — Ambulatory Visit (INDEPENDENT_AMBULATORY_CARE_PROVIDER_SITE_OTHER): Payer: Medicare Other | Admitting: Family Medicine

## 2022-01-22 VITALS — BP 122/70 | HR 89 | Temp 98.3°F | Ht 68.0 in | Wt 286.6 lb

## 2022-01-22 DIAGNOSIS — J069 Acute upper respiratory infection, unspecified: Secondary | ICD-10-CM

## 2022-01-22 MED ORDER — AMOXICILLIN-POT CLAVULANATE 875-125 MG PO TABS: 1.0000 | ORAL_TABLET | Freq: Two times a day (BID) | ORAL | 0 refills | Status: DC

## 2022-01-22 NOTE — Progress Notes (Signed)
Subjective:    Patient ID: William Howell, male    DOB: 02-06-1941, 81 y.o.   MRN: [id number]  Cough  Patient is an 81 year old Caucasian gentleman with a history of asthma and borderline diabetes who presents today with a head cold.  He states that his symptoms began Saturday.  Symptoms include rhinorrhea and head congestion, postnasal drip and cough.  The cough is nonproductive.  On pulmonary exam today his lungs are clear.  There is no wheezes crackles or rails.  He denies any fever or pleurisy or shortness of breath.  He denies any sore throat.  He denies any sinus pain or otalgia.  His wife did a COVID test this morning that was negative.  The rhinorrhea is clear  Past Medical History:  Diagnosis Date   Allergy    Asthma    Diabetes mellitus type II, controlled (HCC)    GERD (gastroesophageal reflux disease)    pt states he takes omeprazole but doesnt have GERD   Hypertension    Obesity    Sleep apnea    uses CPAP   Past Surgical History:  Procedure Laterality Date   CHOLECYSTECTOMY     DENTAL SURGERY     Current Outpatient Medications on File Prior to Visit  Medication Sig Dispense Refill   aspirin EC 81 MG tablet Take 81 mg daily by mouth.     B Complex Vitamins (B COMPLEX 1 PO) Take by mouth.     Calcium Carb-Cholecalciferol (CALCIUM 1000 + D PO) Take 1 tablet by mouth 2 (two) times daily.     cetirizine (ZYRTEC) 10 MG tablet Take 1 tablet (10 mg total) by mouth daily. 90 tablet 3   glucosamine-chondroitin 500-400 MG tablet Take 1 tablet 2 (two) times daily by mouth.     losartan (COZAAR) 50 MG tablet Take 1 tablet (50 mg total) by mouth daily. 90 tablet 3   metFORMIN (GLUCOPHAGE) 500 MG tablet TAKE 1 TABLET BY MOUTH TWICE DAILY WITH  A  MEAL 180 tablet 3   Multiple Vitamins-Minerals (MULTIVITAMIN WITH MINERALS) tablet Take 1 tablet daily by mouth.     Omega-3 Fatty Acids (FISH OIL) 1000 MG CAPS Take 1 capsule by mouth daily.     omeprazole (PRILOSEC) 40 MG capsule  Take 1 capsule by mouth once daily 90 capsule 3   SYMBICORT 160-4.5 MCG/ACT inhaler Inhale 2 puffs by mouth twice daily 11 g 3   tamsulosin (FLOMAX) 0.4 MG CAPS capsule Take 1 capsule by mouth once daily 90 capsule 3   VENTOLIN HFA 108 (90 Base) MCG/ACT inhaler INHALE 1 TO 2 PUFFS BY MOUTH EVERY 6 HOURS AS NEEDED 18 g 0   zafirlukast (ACCOLATE) 20 MG tablet TAKE 1 TABLET BY MOUTH TWICE DAILY BEFORE A MEAL . APPOINTMENT REQUIRED FOR FUTURE REFILLS 180 tablet 3   Current Facility-Administered Medications on File Prior to Visit  Medication Dose Route Frequency Provider Last Rate Last Admin   0.9 %  sodium chloride infusion  500 mL Intravenous Once Iva Boop, MD       No Known Allergies Social History   Socioeconomic History   Marital status: Married    Spouse name: Elnita Maxwell   Number of children: 5   Years of education: Not on file   Highest education level: Not on file  Occupational History   Not on file  Tobacco Use   Smoking status: Never   Smokeless tobacco: Former    Types: Engineer, drilling  Vaping Use: Never used  Substance and Sexual Activity   Alcohol use: Yes    Comment: occasional    Drug use: No   Sexual activity: Not Currently  Other Topics Concern   Not on file  Social History Narrative   3 children, 2 stepchildren   12 grandchildren   7 great grandchildren.   Retired Psychologist, forensic.    Social Determinants of Health   Financial Resource Strain: Low Risk  (10/05/2021)   Overall Financial Resource Strain (CARDIA)    Difficulty of Paying Living Expenses: Not hard at all  Food Insecurity: No Food Insecurity (10/05/2021)   Hunger Vital Sign    Worried About Running Out of Food in the Last Year: Never true    Ran Out of Food in the Last Year: Never true  Transportation Needs: No Transportation Needs (10/05/2021)   PRAPARE - Administrator, Civil Service (Medical): No    Lack of Transportation (Non-Medical): No  Physical Activity:  Insufficiently Active (10/05/2021)   Exercise Vital Sign    Days of Exercise per Week: 3 days    Minutes of Exercise per Session: 30 min  Stress: No Stress Concern Present (10/05/2021)   [employer] of Occupational Health - Occupational Stress Questionnaire    Feeling of Stress : Not at all  Social Connections: Moderately Integrated (10/05/2021)   Social Connection and Isolation Panel [NHANES]    Frequency of Communication with Friends and Family: More than three times a week    Frequency of Social Gatherings with Friends and Family: More than three times a week    Attends Religious Services: 1 to 4 times per year    Active Member of [employer] or Organizations: No    Attends Banker Meetings: Never    Marital Status: Married  Catering manager Violence: Not At Risk (10/05/2021)   Humiliation, Afraid, Rape, and Kick questionnaire    Fear of Current or Ex-Partner: No    Emotionally Abused: No    Physically Abused: No    Sexually Abused: No      Review of Systems  Respiratory:  Positive for cough.   All other systems reviewed and are negative.      Objective:   Physical Exam Vitals reviewed.  Constitutional:      General: He is not in acute distress.    Appearance: He is well-developed. He is not diaphoretic.  HENT:     Head: Normocephalic and atraumatic.     Right Ear: Tympanic membrane, ear canal and external ear normal.     Left Ear: Tympanic membrane, ear canal and external ear normal.     Nose: Congestion and rhinorrhea present.     Mouth/Throat:     Pharynx: No oropharyngeal exudate or posterior oropharyngeal erythema.  Eyes:     General: No scleral icterus.       Right eye: No discharge.        Left eye: No discharge.     Conjunctiva/sclera: Conjunctivae normal.     Pupils: Pupils are equal, round, and reactive to light.  Neck:     Thyroid: No thyromegaly.     Vascular: No JVD.     Trachea: No tracheal deviation.  Cardiovascular:     Rate and  Rhythm: Normal rate and regular rhythm.     Heart sounds: Normal heart sounds. No murmur heard.    No friction rub. No gallop.  Pulmonary:     Effort: Pulmonary effort is  normal. Prolonged expiration present. No accessory muscle usage or respiratory distress.     Breath sounds: Decreased air movement present. No stridor. No wheezing, rhonchi or rales.  Chest:     Chest wall: No tenderness.  Musculoskeletal:     Cervical back: Normal range of motion and neck supple.  Lymphadenopathy:     Cervical: No cervical adenopathy.  Skin:    General: Skin is warm.     Coloration: Skin is not pale.     Findings: No erythema or rash.  Neurological:     Mental Status: He is alert and oriented to person, place, and time.     Cranial Nerves: No cranial nerve deficit.     Motor: No abnormal muscle tone.     Coordination: Coordination normal.     Deep Tendon Reflexes: Reflexes are normal and symmetric.  Psychiatric:        Behavior: Behavior normal.        Thought Content: Thought content normal.        Judgment: Judgment normal.           Assessment & Plan:  Viral URI with cough I believe the patient has been dealing with a bad head cold.  I explained to him that antibiotics do not help with that problem.  This has to run its course.  I recommended Coricidin HBP for head congestion and rhinorrhea.  I recommended Mucinex for chest congestion and as a mucolytic.  He can use Afrin 2 sprays each nostril for 3 days to help with the head congestion and rhinorrhea.  Allow tincture of time.  I did give the patient a prescription for Augmentin.  If he develops severe pain in his sinuses, high fever, headaches, or symptoms persist greater than 9 days, I want him to start taking antibiotic for possible sinus infection but at the present time I think it is a viral upper respiratory infection but I dissuaded him from getting the antibiotic

## 2022-01-24 DIAGNOSIS — G4731 Primary central sleep apnea: Secondary | ICD-10-CM | POA: Diagnosis not present

## 2022-01-24 DIAGNOSIS — G4733 Obstructive sleep apnea (adult) (pediatric): Secondary | ICD-10-CM | POA: Diagnosis not present

## 2022-01-24 DIAGNOSIS — G4730 Sleep apnea, unspecified: Secondary | ICD-10-CM | POA: Diagnosis not present

## 2022-03-11 ENCOUNTER — Ambulatory Visit: Payer: Medicare Other | Admitting: Family Medicine

## 2022-03-11 ENCOUNTER — Encounter: Payer: Self-pay | Admitting: Family Medicine

## 2022-03-11 VITALS — BP 130/78 | HR 69 | Temp 98.2°F | Ht 68.0 in | Wt 284.0 lb

## 2022-03-11 DIAGNOSIS — R052 Subacute cough: Secondary | ICD-10-CM

## 2022-03-11 DIAGNOSIS — J209 Acute bronchitis, unspecified: Secondary | ICD-10-CM | POA: Diagnosis not present

## 2022-03-11 DIAGNOSIS — J4540 Moderate persistent asthma, uncomplicated: Secondary | ICD-10-CM

## 2022-03-11 MED ORDER — PREDNISONE 50 MG PO TABS: ORAL_TABLET | ORAL | 0 refills | Status: DC

## 2022-03-11 MED ORDER — GUAIFENESIN-CODEINE 100-10 MG/5ML PO SOLN: 5.0000 mL | Freq: Two times a day (BID) | ORAL | 0 refills | Status: DC | PRN

## 2022-03-11 NOTE — Progress Notes (Signed)
Acute Office Visit  Subjective:     Patient ID: William Howell, male    DOB: [birth year], 81 y.o.   MRN: [id number]  Chief Complaint  Patient presents with   Follow-up    congested using inhaler a lot/couhging a lot    HPI Patient is in today for productive cough and increased inhaler use for several weeks associated with shortness of breath and wheezing, fatigue. His congestion is improved with antibiotics. Has tried Albuterol and Nyquil/Mucinex cough syrup with mild improvement. Did complete his course of Augmentin in October and saw improvement in some symptoms. Denies fever, chest pain.    Review of Systems  Constitutional: Negative.   HENT: Negative.    Respiratory:  Positive for cough, shortness of breath and wheezing.   Cardiovascular: Negative.     Past Medical History:  Diagnosis Date   Allergy    Asthma    Diabetes mellitus type II, controlled ([place])    GERD (gastroesophageal reflux disease)    pt states he takes omeprazole but doesnt have GERD   Hypertension    Obesity    Sleep apnea    uses CPAP   Past Surgical History:  Procedure Laterality Date   CHOLECYSTECTOMY     DENTAL SURGERY     Current Outpatient Medications on File Prior to Visit  Medication Sig Dispense Refill   amoxicillin-clavulanate (AUGMENTIN) 875-125 MG tablet Take 1 tablet by mouth 2 (two) times daily. 20 tablet 0   B Complex Vitamins (B COMPLEX 1 PO) Take by mouth.     Calcium Carb-Cholecalciferol (CALCIUM 1000 + D PO) Take 1 tablet by mouth 2 (two) times daily.     cetirizine (ZYRTEC) 10 MG tablet Take 1 tablet (10 mg total) by mouth daily. 90 tablet 3   glucosamine-chondroitin 500-400 MG tablet Take 1 tablet 2 (two) times daily by mouth.     losartan (COZAAR) 50 MG tablet Take 1 tablet (50 mg total) by mouth daily. 90 tablet 3   metFORMIN (GLUCOPHAGE) 500 MG tablet TAKE 1 TABLET BY MOUTH TWICE DAILY WITH  A  MEAL 180 tablet 3   Multiple Vitamins-Minerals (MULTIVITAMIN WITH  MINERALS) tablet Take 1 tablet daily by mouth.     Omega-3 Fatty Acids (FISH OIL) 1000 MG CAPS Take 1 capsule by mouth daily.     omeprazole (PRILOSEC) 40 MG capsule Take 1 capsule by mouth once daily 90 capsule 3   SYMBICORT 160-4.5 MCG/ACT inhaler Inhale 2 puffs by mouth twice daily 11 g 3   tamsulosin (FLOMAX) 0.4 MG CAPS capsule Take 1 capsule by mouth once daily 90 capsule 3   VENTOLIN HFA 108 (90 Base) MCG/ACT inhaler INHALE 1 TO 2 PUFFS BY MOUTH EVERY 6 HOURS AS NEEDED 18 g 0   zafirlukast (ACCOLATE) 20 MG tablet TAKE 1 TABLET BY MOUTH TWICE DAILY BEFORE A MEAL . APPOINTMENT REQUIRED FOR FUTURE REFILLS 180 tablet 3   aspirin EC 81 MG tablet Take 81 mg daily by mouth. (Patient not taking: Reported on 03/11/2022)     Current Facility-Administered Medications on File Prior to Visit  Medication Dose Route Frequency Provider Last Rate Last Admin   0.9 %  sodium chloride infusion  500 mL Intravenous Once Gatha Mayer, MD       No Known Allergies      Objective:    BP 130/78   Pulse 69   Temp 98.2 F (36.8 C) (Oral)   Ht 5\' 8"  (1.727 m)   Wt  284 lb (128.8 kg)   SpO2 96%   BMI 43.18 kg/m    Physical Exam Vitals and nursing note reviewed.  Constitutional:      Appearance: Normal appearance. He is normal weight.  HENT:     Head: Normocephalic and atraumatic.  Cardiovascular:     Rate and Rhythm: Normal rate and regular rhythm.     Pulses: Normal pulses.     Heart sounds: Normal heart sounds.  Pulmonary:     Effort: Pulmonary effort is normal.     Breath sounds: Examination of the right-upper field reveals wheezing. Examination of the left-upper field reveals wheezing. Examination of the right-lower field reveals wheezing. Examination of the left-lower field reveals wheezing. Wheezing present.  Skin:    General: Skin is warm and dry.     Capillary Refill: Capillary refill takes less than 2 seconds.  Neurological:     General: No focal deficit present.     Mental Status:  He is alert and oriented to person, place, and time. Mental status is at baseline.  Psychiatric:        Mood and Affect: Mood normal.        Behavior: Behavior normal.        Thought Content: Thought content normal.        Judgment: Judgment normal.     No results found for any visits on 03/11/22.      Assessment & Plan:   Problem List Items Addressed This Visit       Respiratory   Asthma, chronic   Relevant Medications   predniSONE (DELTASONE) 50 MG tablet   Acute bronchitis - Primary    Patient seems to have a subacute cough with symptoms overall improving outside of the cough and wheezing. Start prednisone for 7 days for asthma exacerbation and continue Albuterol 1-2 puffs every 6 hours as needed for wheezing and shortness of breath. Start guaifenesin-codeine cough syrup twice daily. Return to office if symptoms persist or worsen to include fever or uncontrolled wheezing or shortness of breath.      Other Visit Diagnoses     Subacute cough           Meds ordered this encounter  Medications   predniSONE (DELTASONE) 50 MG tablet    Sig: Take one tablet daily for 7 days    Dispense:  7 tablet    Refill:  0    Order Specific Question:   Supervising Provider    Answer:   Lynnea Ferrier T [[id number]]   guaiFENesin-codeine 100-10 MG/5ML syrup    Sig: Take 5 mLs by mouth 2 (two) times daily as needed for cough.    Dispense:  120 mL    Refill:  0    Order Specific Question:   Supervising Provider    Answer:   Lynnea Ferrier T [[id number]]    No follow-ups on file.  Park Meo, FNP

## 2022-03-11 NOTE — Assessment & Plan Note (Signed)
Patient seems to have a subacute cough with symptoms overall improving outside of the cough and wheezing. Start prednisone for 7 days for asthma exacerbation and continue Albuterol 1-2 puffs every 6 hours as needed for wheezing and shortness of breath. Start guaifenesin-codeine cough syrup twice daily. Return to office if symptoms persist or worsen to include fever or uncontrolled wheezing or shortness of breath.

## 2022-03-19 ENCOUNTER — Ambulatory Visit (INDEPENDENT_AMBULATORY_CARE_PROVIDER_SITE_OTHER): Payer: Medicare Other

## 2022-03-19 ENCOUNTER — Encounter: Payer: Self-pay | Admitting: Pulmonary Disease

## 2022-03-19 ENCOUNTER — Ambulatory Visit: Payer: Medicare Other | Admitting: Pulmonary Disease

## 2022-03-19 VITALS — BP 118/62 | HR 85 | Temp 98.2°F | Ht 68.0 in | Wt 287.4 lb

## 2022-03-19 DIAGNOSIS — R052 Subacute cough: Secondary | ICD-10-CM | POA: Diagnosis not present

## 2022-03-19 DIAGNOSIS — R059 Cough, unspecified: Secondary | ICD-10-CM | POA: Diagnosis not present

## 2022-03-19 MED ORDER — PREDNISONE 20 MG PO TABS: 20.0000 mg | ORAL_TABLET | Freq: Every day | ORAL | 0 refills | Status: DC

## 2022-03-19 MED ORDER — DOXYCYCLINE HYCLATE 100 MG PO TABS: 100.0000 mg | ORAL_TABLET | Freq: Two times a day (BID) | ORAL | 0 refills | Status: DC

## 2022-03-19 NOTE — Patient Instructions (Addendum)
Obtain chest x-ray today  Prescription for doxycycline will be sent to new pharmacy  Prednisone 20 mg daily for 7 days  Follow-up in about 6 weeks  You are welcome to call us if you are not feeling better after completing the course of antibiotics and steroids

## 2022-03-19 NOTE — Progress Notes (Signed)
William Howell    [id number]    16-Jul-1940  Primary Care Physician:[name], Priscille Heidelberg, MD  Referring Physician: Donita Brooks, MD [address] [place],  [place] [address]  Chief complaint:   Cough for about 2 months  HPI:  Cough and worsening shortness of breath  Cough productive of yellow phlegm  Did use a course of Augmentin and recently did use a course of prednisone  Cough, shortness of breath Some chest discomfort from coughing  Has not been having any fevers or chills  More short of breath than his usual  Has obstructive sleep apnea for which he remains compliant with CPAP use   Outpatient Encounter Medications as of 03/19/2022  Medication Sig   aspirin EC 81 MG tablet Take 81 mg by mouth daily.   B Complex Vitamins (B COMPLEX 1 PO) Take by mouth.   Calcium Carb-Cholecalciferol (CALCIUM 1000 + D PO) Take 1 tablet by mouth 2 (two) times daily.   cetirizine (ZYRTEC) 10 MG tablet Take 1 tablet (10 mg total) by mouth daily.   glucosamine-chondroitin 500-400 MG tablet Take 1 tablet 2 (two) times daily by mouth.   losartan (COZAAR) 50 MG tablet Take 1 tablet (50 mg total) by mouth daily.   metFORMIN (GLUCOPHAGE) 500 MG tablet TAKE 1 TABLET BY MOUTH TWICE DAILY WITH  A  MEAL   Multiple Vitamins-Minerals (MULTIVITAMIN WITH MINERALS) tablet Take 1 tablet daily by mouth.   Omega-3 Fatty Acids (FISH OIL) 1000 MG CAPS Take 1 capsule by mouth daily.   omeprazole (PRILOSEC) 40 MG capsule Take 1 capsule by mouth once daily   predniSONE (DELTASONE) 50 MG tablet Take one tablet daily for 7 days   SYMBICORT 160-4.5 MCG/ACT inhaler Inhale 2 puffs by mouth twice daily   tamsulosin (FLOMAX) 0.4 MG CAPS capsule Take 1 capsule by mouth once daily   VENTOLIN HFA 108 (90 Base) MCG/ACT inhaler INHALE 1 TO 2 PUFFS BY MOUTH EVERY 6 HOURS AS NEEDED   zafirlukast (ACCOLATE) 20 MG tablet TAKE 1 TABLET BY MOUTH TWICE DAILY BEFORE A MEAL . APPOINTMENT REQUIRED FOR FUTURE  REFILLS   guaiFENesin-codeine 100-10 MG/5ML syrup Take 5 mLs by mouth 2 (two) times daily as needed for cough. (Patient not taking: Reported on 03/19/2022)   [DISCONTINUED] amoxicillin-clavulanate (AUGMENTIN) 875-125 MG tablet Take 1 tablet by mouth 2 (two) times daily. (Patient not taking: Reported on 03/19/2022)   Facility-Administered Encounter Medications as of 03/19/2022  Medication   0.9 %  sodium chloride infusion    Allergies as of 03/19/2022   (No Known Allergies)    Past Medical History:  Diagnosis Date   Allergy    Asthma    Diabetes mellitus type II, controlled (HCC)    GERD (gastroesophageal reflux disease)    pt states he takes omeprazole but doesnt have GERD   Hypertension    Obesity    Sleep apnea    uses CPAP    Past Surgical History:  Procedure Laterality Date   CHOLECYSTECTOMY     DENTAL SURGERY      Family History  Problem Relation Age of Onset   Heart disease Father    Heart attack Father    Heart attack Sister    Colon cancer Neg Hx    Colon polyps Neg Hx    Esophageal cancer Neg Hx    Rectal cancer Neg Hx    Stomach cancer Neg Hx    Pancreatic  cancer Neg Hx    Liver cancer Neg Hx    Prostate cancer Neg Hx     Social History   Socioeconomic History   Marital status: Married    Spouse name: Elnita Maxwell   Number of children: 5   Years of education: Not on file   Highest education level: Not on file  Occupational History   Not on file  Tobacco Use   Smoking status: Never   Smokeless tobacco: Former    Types: Associate Professor Use: Never used  Substance and Sexual Activity   Alcohol use: Yes    Comment: occasional    Drug use: No   Sexual activity: Not Currently  Other Topics Concern   Not on file  Social History Narrative   3 children, 2 stepchildren   12 grandchildren   7 great grandchildren.   Retired Psychologist, forensic.    Social Determinants of Health   Financial Resource Strain: Low Risk  (10/05/2021)   Overall  Financial Resource Strain (CARDIA)    Difficulty of Paying Living Expenses: Not hard at all  Food Insecurity: No Food Insecurity (10/05/2021)   Hunger Vital Sign    Worried About Running Out of Food in the Last Year: Never true    Ran Out of Food in the Last Year: Never true  Transportation Needs: No Transportation Needs (10/05/2021)   PRAPARE - Administrator, Civil Service (Medical): No    Lack of Transportation (Non-Medical): No  Physical Activity: Insufficiently Active (10/05/2021)   Exercise Vital Sign    Days of Exercise per Week: 3 days    Minutes of Exercise per Session: 30 min  Stress: No Stress Concern Present (10/05/2021)   [employer] of Occupational Health - Occupational Stress Questionnaire    Feeling of Stress : Not at all  Social Connections: Moderately Integrated (10/05/2021)   Social Connection and Isolation Panel [NHANES]    Frequency of Communication with Friends and Family: More than three times a week    Frequency of Social Gatherings with Friends and Family: More than three times a week    Attends Religious Services: 1 to 4 times per year    Active Member of [employer] or Organizations: No    Attends Banker Meetings: Never    Marital Status: Married  Catering manager Violence: Not At Risk (10/05/2021)   Humiliation, Afraid, Rape, and Kick questionnaire    Fear of Current or Ex-Partner: No    Emotionally Abused: No    Physically Abused: No    Sexually Abused: No    Review of Systems  Respiratory:  Positive for apnea, cough and shortness of breath.   Psychiatric/Behavioral:  Positive for sleep disturbance.     Vitals:   03/19/22 1037  BP: 118/62  Pulse: 85  Temp: 98.2 F (36.8 C)  SpO2: 95%     Physical Exam Constitutional:      Appearance: He is obese.  HENT:     Head: Normocephalic.     Mouth/Throat:     Mouth: Mucous membranes are moist.  Eyes:     Pupils: Pupils are equal, round, and reactive to light.   Cardiovascular:     Rate and Rhythm: Normal rate and regular rhythm.     Heart sounds: No murmur heard.    No friction rub.  Pulmonary:     Effort: No respiratory distress.     Breath sounds: No stridor. No wheezing or  rhonchi.  Musculoskeletal:     Cervical back: No rigidity or tenderness.  Neurological:     Mental Status: He is alert.  Psychiatric:        Mood and Affect: Mood normal.    Data Reviewed: BiPAP compliance shows excellent compliance with BiPAP on CPAP of 14/10 Residual AHI of 4.1  Assessment:  Subacute cough  Nonresolution of symptoms  Failed course of antibiotics recently  Obstructive sleep apnea -Compliant with BiPAP -Continues to benefit from BiPAP  Plan/Recommendations: Call in a prescription for doxycycline to be used for 7 days  Prescription for prednisone 20 mg daily for 7 days  Obtain chest x-ray today  Follow-up in about 6 to 8 weeks  Encouraged to call with significant concerns   Virl Diamond MD [facility] 03/19/2022, 10:59 AM  CC: Donita Brooks, MD

## 2022-03-20 ENCOUNTER — Other Ambulatory Visit: Payer: Self-pay | Admitting: Family Medicine

## 2022-03-21 ENCOUNTER — Telehealth: Payer: Self-pay | Admitting: Pulmonary Disease

## 2022-03-21 DIAGNOSIS — R911 Solitary pulmonary nodule: Secondary | ICD-10-CM

## 2022-03-21 DIAGNOSIS — J40 Bronchitis, not specified as acute or chronic: Secondary | ICD-10-CM

## 2022-03-21 NOTE — Telephone Encounter (Signed)
Called patient and he states that he would like the results of his chest xray.   Chest xray was done 03/19/22  Please advise sir

## 2022-03-27 NOTE — Telephone Encounter (Signed)
X-ray findings can be seen in bronchitis  -breathing tubes appears inflamed  Description of a small spot in the lung will be best evaluated by obtaining a CT scan of the chest The CT scan that he had done for his heart did not mention anything about a spot on the lung 6 months ago  CT scan of the chest without contrast if patient agreeable should be ordered to evaluate a possible spot in the left lung

## 2022-03-27 NOTE — Telephone Encounter (Signed)
Called patient and went over results of chest xray. Patient verbalized understanding of CT scan of chest. Nothing further needed

## 2022-04-01 ENCOUNTER — Other Ambulatory Visit: Payer: Self-pay | Admitting: Pulmonary Disease

## 2022-04-04 ENCOUNTER — Ambulatory Visit (HOSPITAL_COMMUNITY)
Admission: RE | Admit: 2022-04-04 | Discharge: 2022-04-04 | Disposition: A | Discharge status: Home / Self Care | Payer: Medicare Other | Source: Ambulatory Visit | Attending: Pulmonary Disease | Admitting: Pulmonary Disease

## 2022-04-04 DIAGNOSIS — J40 Bronchitis, not specified as acute or chronic: Secondary | ICD-10-CM | POA: Diagnosis not present

## 2022-04-04 DIAGNOSIS — J849 Interstitial pulmonary disease, unspecified: Secondary | ICD-10-CM | POA: Diagnosis not present

## 2022-04-05 ENCOUNTER — Telehealth: Payer: Self-pay

## 2022-04-05 ENCOUNTER — Other Ambulatory Visit: Payer: Self-pay | Admitting: Family Medicine

## 2022-04-05 MED ORDER — PREDNISONE 20 MG PO TABS: ORAL_TABLET | ORAL | 0 refills | Status: DC

## 2022-04-05 MED ORDER — LEVOFLOXACIN 500 MG PO TABS: 500.0000 mg | ORAL_TABLET | Freq: Every day | ORAL | 0 refills | Status: AC

## 2022-04-05 NOTE — Telephone Encounter (Signed)
Called spoke w/wife, cheryle, aware Rx is sent to pharmacy per Dr. Glennon Mac for pt 04/11/22, wife aware (I will send in steroids and levaquin and I want to see him next week to see what's is going on. )  Nothing further.

## 2022-04-05 NOTE — Telephone Encounter (Signed)
Pt's wife called in stating that pt was seen recently and has taken all the antibiotics prescribed. Pt's wife states that pt is still not getting any better and would like to know if pt could get a refill of antibiotic sent in to pharmacy. Please advise.  Cb#: [phone]

## 2022-04-05 NOTE — Telephone Encounter (Signed)
Pt's daughter, Efraim Kaufmann, called stating that pt is still not feeling well even after taking the abx per Amber,FNP, 03/11/22. Per Daughter (NP), stated that listened to pt's chest and there is still wheezing/crackles.   Request for something else? Pls advice?

## 2022-04-11 ENCOUNTER — Ambulatory Visit: Payer: Medicare Other | Admitting: Family Medicine

## 2022-04-11 ENCOUNTER — Encounter: Payer: Self-pay | Admitting: Family Medicine

## 2022-04-11 VITALS — BP 126/78 | HR 101 | Ht 68.0 in | Wt 285.0 lb

## 2022-04-11 DIAGNOSIS — J470 Bronchiectasis with acute lower respiratory infection: Secondary | ICD-10-CM | POA: Diagnosis not present

## 2022-04-11 DIAGNOSIS — J209 Acute bronchitis, unspecified: Secondary | ICD-10-CM

## 2022-04-11 DIAGNOSIS — J189 Pneumonia, unspecified organism: Secondary | ICD-10-CM | POA: Diagnosis not present

## 2022-04-11 DIAGNOSIS — J849 Interstitial pulmonary disease, unspecified: Secondary | ICD-10-CM | POA: Diagnosis not present

## 2022-04-11 DIAGNOSIS — J440 Chronic obstructive pulmonary disease with acute lower respiratory infection: Secondary | ICD-10-CM

## 2022-04-11 MED ORDER — BREZTRI AEROSPHERE 160-9-4.8 MCG/ACT IN AERO: 2.0000 | INHALATION_SPRAY | Freq: Two times a day (BID) | RESPIRATORY_TRACT | 11 refills | Status: DC

## 2022-04-11 MED ORDER — LEVOFLOXACIN 500 MG PO TABS: 500.0000 mg | ORAL_TABLET | Freq: Every day | ORAL | 0 refills | Status: AC

## 2022-04-11 NOTE — Progress Notes (Signed)
Subjective:    Patient ID: William Howell, male    DOB: [birth year], 81 y.o.   MRN: [id number]  Patient has had a persistent cough for several months ever since October.  He saw his pulmonologist in December and was given doxycycline and prednisone.  Despite this the cough persisted and he was developing worsening chest congestion, shortness of breath, and wheezing.  Patient had CT scan along 10/21 we did bronchiectasis the right lung with bronchitis and a right lower lobe opacity concerning for pneumonia along with worsening interstitial lung disease.  Patient was placed on prednisone and Levaquin and he states he is doing much better however today on examination he still has prominent right basilar crackles and expiratory wheezing.  He is using his Symbicort Past Medical History:  Diagnosis Date   Allergy    Asthma    Diabetes mellitus type II, controlled ([place])    GERD (gastroesophageal reflux disease)    pt states he takes omeprazole but doesnt have GERD   Hypertension    Obesity    Sleep apnea    uses CPAP   Past Surgical History:  Procedure Laterality Date   CHOLECYSTECTOMY     DENTAL SURGERY     Current Outpatient Medications on File Prior to Visit  Medication Sig Dispense Refill   aspirin EC 81 MG tablet Take 81 mg by mouth daily.     B Complex Vitamins (B COMPLEX 1 PO) Take by mouth.     Calcium Carb-Cholecalciferol (CALCIUM 1000 + D PO) Take 1 tablet by mouth 2 (two) times daily.     cetirizine (ZYRTEC) 10 MG tablet Take 1 tablet (10 mg total) by mouth daily. 90 tablet 3   doxycycline (VIBRA-TABS) 100 MG tablet Take 1 tablet (100 mg total) by mouth 2 (two) times daily. 14 tablet 0   glucosamine-chondroitin 500-400 MG tablet Take 1 tablet 2 (two) times daily by mouth.     levofloxacin (LEVAQUIN) 500 MG tablet Take 1 tablet (500 mg total) by mouth daily for 7 days. 7 tablet 0   losartan (COZAAR) 50 MG tablet Take 1 tablet (50 mg total) by mouth daily. 90 tablet 3    metFORMIN (GLUCOPHAGE) 500 MG tablet TAKE 1 TABLET BY MOUTH TWICE DAILY WITH  A  MEAL 180 tablet 3   Multiple Vitamins-Minerals (MULTIVITAMIN WITH MINERALS) tablet Take 1 tablet daily by mouth.     Omega-3 Fatty Acids (FISH OIL) 1000 MG CAPS Take 1 capsule by mouth daily.     omeprazole (PRILOSEC) 40 MG capsule Take 1 capsule by mouth once daily 90 capsule 0   SYMBICORT 160-4.5 MCG/ACT inhaler Inhale 2 puffs by mouth twice daily 11 g 3   tamsulosin (FLOMAX) 0.4 MG CAPS capsule Take 1 capsule by mouth once daily 90 capsule 3   VENTOLIN HFA 108 (90 Base) MCG/ACT inhaler INHALE 1 TO 2 PUFFS BY MOUTH EVERY 6 HOURS AS NEEDED 18 g 0   zafirlukast (ACCOLATE) 20 MG tablet TAKE 1 TABLET BY MOUTH TWICE DAILY BEFORE A MEAL . APPOINTMENT REQUIRED FOR FUTURE REFILLS 180 tablet 3   Current Facility-Administered Medications on File Prior to Visit  Medication Dose Route Frequency Provider Last Rate Last Admin   0.9 %  sodium chloride infusion  500 mL Intravenous Once Gatha Mayer, MD       No Known Allergies Social History   Socioeconomic History   Marital status: Married    Spouse name: Malachy Mood   Number of children: 5  Years of education: Not on file   Highest education level: Not on file  Occupational History   Not on file  Tobacco Use   Smoking status: Never   Smokeless tobacco: Former    Types: Designer, multimedia Use   Vaping Use: Never used  Substance and Sexual Activity   Alcohol use: Yes    Comment: occasional    Drug use: No   Sexual activity: Not Currently  Other Topics Concern   Not on file  Social History Narrative   3 children, 2 stepchildren   12 grandchildren   7 great grandchildren.   Retired Psychologist, forensic.    Social Determinants of Health   Financial Resource Strain: Low Risk  (10/05/2021)   Overall Financial Resource Strain (CARDIA)    Difficulty of Paying Living Expenses: Not hard at all  Food Insecurity: No Food Insecurity (10/05/2021)   Hunger Vital Sign     Worried About Running Out of Food in the Last Year: Never true    Ran Out of Food in the Last Year: Never true  Transportation Needs: No Transportation Needs (10/05/2021)   PRAPARE - Administrator, Civil Service (Medical): No    Lack of Transportation (Non-Medical): No  Physical Activity: Insufficiently Active (10/05/2021)   Exercise Vital Sign    Days of Exercise per Week: 3 days    Minutes of Exercise per Session: 30 min  Stress: No Stress Concern Present (10/05/2021)   [employer] of Occupational Health - Occupational Stress Questionnaire    Feeling of Stress : Not at all  Social Connections: Moderately Integrated (10/05/2021)   Social Connection and Isolation Panel [NHANES]    Frequency of Communication with Friends and Family: More than three times a week    Frequency of Social Gatherings with Friends and Family: More than three times a week    Attends Religious Services: 1 to 4 times per year    Active Member of [employer] or Organizations: No    Attends Banker Meetings: Never    Marital Status: Married  Catering manager Violence: Not At Risk (10/05/2021)   Humiliation, Afraid, Rape, and Kick questionnaire    Fear of Current or Ex-Partner: No    Emotionally Abused: No    Physically Abused: No    Sexually Abused: No      Review of Systems  Respiratory:  Positive for cough.   All other systems reviewed and are negative.      Objective:   Physical Exam Vitals reviewed.  Constitutional:      General: He is not in acute distress.    Appearance: He is well-developed. He is not diaphoretic.  HENT:     Head: Normocephalic and atraumatic.     Right Ear: Tympanic membrane, ear canal and external ear normal.     Left Ear: Tympanic membrane, ear canal and external ear normal.     Mouth/Throat:     Pharynx: No oropharyngeal exudate or posterior oropharyngeal erythema.  Eyes:     General: No scleral icterus.       Right eye: No discharge.        Left  eye: No discharge.     Conjunctiva/sclera: Conjunctivae normal.     Pupils: Pupils are equal, round, and reactive to light.  Neck:     Thyroid: No thyromegaly.     Vascular: No JVD.     Trachea: No tracheal deviation.  Cardiovascular:     Rate and  Rhythm: Normal rate and regular rhythm.     Heart sounds: Normal heart sounds. No murmur heard.    No friction rub. No gallop.  Pulmonary:     Effort: Pulmonary effort is normal. Prolonged expiration present. No accessory muscle usage or respiratory distress.     Breath sounds: Decreased air movement present. No stridor. Wheezing and rales present. No rhonchi.  Chest:     Chest wall: No tenderness.  Musculoskeletal:     Cervical back: Normal range of motion and neck supple.  Lymphadenopathy:     Cervical: No cervical adenopathy.  Skin:    General: Skin is warm.     Coloration: Skin is not pale.     Findings: No erythema or rash.  Neurological:     Mental Status: He is alert and oriented to person, place, and time.     Cranial Nerves: No cranial nerve deficit.     Motor: No abnormal muscle tone.     Coordination: Coordination normal.     Deep Tendon Reflexes: Reflexes are normal and symmetric.  Psychiatric:        Behavior: Behavior normal.        Thought Content: Thought content normal.        Judgment: Judgment normal.           Assessment & Plan:  Pneumonia of right lower lobe due to infectious organism  Bronchiectasis with acute lower respiratory infection ([place])  Acute bronchitis with COPD ([place])  Interstitial lung disease ([place]) I reviewed his CT scan with him.  I explained that the interstitial lung disease has progressed since June.  However he also has bronchiectasis and right lower lobe pneumonia.  I will extend out the Levaquin for an additional week based on his abnormal pulmonary exam today.  I want to upgrade his Symbicort to [facility] 2 puffs inhaled twice daily.  I have asked the patient to start a flutter valve  program to improve pulmonary toilet and also add Mucinex.  Recheck in 1 week or sooner if worse

## 2022-04-22 ENCOUNTER — Ambulatory Visit (INDEPENDENT_AMBULATORY_CARE_PROVIDER_SITE_OTHER): Payer: Medicare Other | Admitting: Family Medicine

## 2022-04-22 ENCOUNTER — Encounter: Payer: Self-pay | Admitting: Family Medicine

## 2022-04-22 VITALS — BP 126/82 | HR 79 | Ht 68.0 in | Wt 286.0 lb

## 2022-04-22 DIAGNOSIS — J189 Pneumonia, unspecified organism: Secondary | ICD-10-CM

## 2022-04-22 DIAGNOSIS — J849 Interstitial pulmonary disease, unspecified: Secondary | ICD-10-CM

## 2022-04-22 DIAGNOSIS — J470 Bronchiectasis with acute lower respiratory infection: Secondary | ICD-10-CM | POA: Diagnosis not present

## 2022-04-22 DIAGNOSIS — J479 Bronchiectasis, uncomplicated: Secondary | ICD-10-CM | POA: Diagnosis not present

## 2022-04-22 NOTE — Progress Notes (Signed)
Subjective:    Patient ID: William Howell, male    DOB: 10-Jul-1940, 82 y.o.   MRN: [id number] 04/11/22 Patient has had a persistent cough for several months ever since October.  He saw his pulmonologist in December and was given doxycycline and prednisone.  Despite this the cough persisted and he was developing worsening chest congestion, shortness of breath, and wheezing.  Patient had CT scan along 10/21 we did bronchiectasis the right lung with bronchitis and a right lower lobe opacity concerning for pneumonia along with worsening interstitial lung disease.  Patient was placed on prednisone and Levaquin and he states he is doing much better however today on examination he still has prominent right basilar crackles and expiratory wheezing.  He is using his Symbicort.  At that time, my plan was:  I reviewed his CT scan with him.  I explained that the interstitial lung disease has progressed since June.  However he also has bronchiectasis and right lower lobe pneumonia.  I will extend out the Levaquin for an additional week based on his abnormal pulmonary exam today.  I want to upgrade his Symbicort to [facility] 2 puffs inhaled twice daily.  I have asked the patient to start a flutter valve program to improve pulmonary toilet and also add Mucinex.  Recheck in 1 week or sooner if worse  04/22/22 Patient returns today for reevaluation.  He continues to report shortness of breath with activity.  His wife states that he becomes easily winded just walking around outside his home.  On examination he still has rhonchorous breath sounds in both lungs right greater than left.  The wheezing has subsided in both lungs.  His pulse oximetry today is excellent at 99% on room air however his exercise tolerance is poor.  He has not been using the flutter valve yet.  They have tried to find it but they have been unable to locate 1.  He has not been taking Mucinex however I believe that I failed to mention that at his last  visit.  He did just acquire the [facility] but he has not started it yet.  He has completed the Levaquin Past Medical History:  Diagnosis Date   Allergy    Asthma    Diabetes mellitus type II, controlled ([facility])    GERD (gastroesophageal reflux disease)    pt states he takes omeprazole but doesnt have GERD   Hypertension    Obesity    Sleep apnea    uses CPAP   Past Surgical History:  Procedure Laterality Date   CHOLECYSTECTOMY     DENTAL SURGERY     Current Outpatient Medications on File Prior to Visit  Medication Sig Dispense Refill   aspirin EC 81 MG tablet Take 81 mg by mouth daily.     B Complex Vitamins (B COMPLEX 1 PO) Take by mouth.     Budeson-Glycopyrrol-Formoterol (BREZTRI AEROSPHERE) 160-9-4.8 MCG/ACT AERO Inhale 2 puffs into the lungs 2 (two) times daily. 10.7 g 11   Calcium Carb-Cholecalciferol (CALCIUM 1000 + D PO) Take 1 tablet by mouth 2 (two) times daily.     cetirizine (ZYRTEC) 10 MG tablet Take 1 tablet (10 mg total) by mouth daily. 90 tablet 3   glucosamine-chondroitin 500-400 MG tablet Take 1 tablet 2 (two) times daily by mouth.     losartan (COZAAR) 50 MG tablet Take 1 tablet (50 mg total) by mouth daily. 90 tablet 3   metFORMIN (GLUCOPHAGE) 500 MG tablet TAKE 1 TABLET BY  MOUTH TWICE DAILY WITH  A  MEAL 180 tablet 3   Multiple Vitamins-Minerals (MULTIVITAMIN WITH MINERALS) tablet Take 1 tablet daily by mouth.     Omega-3 Fatty Acids (FISH OIL) 1000 MG CAPS Take 1 capsule by mouth daily.     omeprazole (PRILOSEC) 40 MG capsule Take 1 capsule by mouth once daily 90 capsule 0   SYMBICORT 160-4.5 MCG/ACT inhaler Inhale 2 puffs by mouth twice daily 11 g 3   tamsulosin (FLOMAX) 0.4 MG CAPS capsule Take 1 capsule by mouth once daily 90 capsule 3   VENTOLIN HFA 108 (90 Base) MCG/ACT inhaler INHALE 1 TO 2 PUFFS BY MOUTH EVERY 6 HOURS AS NEEDED 18 g 0   zafirlukast (ACCOLATE) 20 MG tablet TAKE 1 TABLET BY MOUTH TWICE DAILY BEFORE A MEAL . APPOINTMENT REQUIRED FOR FUTURE  REFILLS 180 tablet 3   Current Facility-Administered Medications on File Prior to Visit  Medication Dose Route Frequency Provider Last Rate Last Admin   0.9 %  sodium chloride infusion  500 mL Intravenous Once Iva Boop, MD       No Known Allergies Social History   Socioeconomic History   Marital status: Married    Spouse name: Elnita Maxwell   Number of children: 5   Years of education: Not on file   Highest education level: Not on file  Occupational History   Not on file  Tobacco Use   Smoking status: Never   Smokeless tobacco: Former    Types: Associate Professor Use: Never used  Substance and Sexual Activity   Alcohol use: Yes    Comment: occasional    Drug use: No   Sexual activity: Not Currently  Other Topics Concern   Not on file  Social History Narrative   3 children, 2 stepchildren   12 grandchildren   7 great grandchildren.   Retired Psychologist, forensic.    Social Determinants of Health   Financial Resource Strain: Low Risk  (10/05/2021)   Overall Financial Resource Strain (CARDIA)    Difficulty of Paying Living Expenses: Not hard at all  Food Insecurity: No Food Insecurity (10/05/2021)   Hunger Vital Sign    Worried About Running Out of Food in the Last Year: Never true    Ran Out of Food in the Last Year: Never true  Transportation Needs: No Transportation Needs (10/05/2021)   PRAPARE - Administrator, Civil Service (Medical): No    Lack of Transportation (Non-Medical): No  Physical Activity: Insufficiently Active (10/05/2021)   Exercise Vital Sign    Days of Exercise per Week: 3 days    Minutes of Exercise per Session: 30 min  Stress: No Stress Concern Present (10/05/2021)   [employer] of Occupational Health - Occupational Stress Questionnaire    Feeling of Stress : Not at all  Social Connections: Moderately Integrated (10/05/2021)   Social Connection and Isolation Panel [NHANES]    Frequency of Communication with Friends and  Family: More than three times a week    Frequency of Social Gatherings with Friends and Family: More than three times a week    Attends Religious Services: 1 to 4 times per year    Active Member of [employer] or Organizations: No    Attends Banker Meetings: Never    Marital Status: Married  Catering manager Violence: Not At Risk (10/05/2021)   Humiliation, Afraid, Rape, and Kick questionnaire    Fear of Current or Ex-Partner:  No    Emotionally Abused: No    Physically Abused: No    Sexually Abused: No      Review of Systems  Respiratory:  Positive for cough.   All other systems reviewed and are negative.      Objective:   Physical Exam Vitals reviewed.  Constitutional:      General: He is not in acute distress.    Appearance: He is well-developed. He is not diaphoretic.  HENT:     Head: Normocephalic and atraumatic.     Right Ear: Tympanic membrane, ear canal and external ear normal.     Left Ear: Tympanic membrane, ear canal and external ear normal.     Mouth/Throat:     Pharynx: No oropharyngeal exudate or posterior oropharyngeal erythema.  Eyes:     General: No scleral icterus.       Right eye: No discharge.        Left eye: No discharge.     Conjunctiva/sclera: Conjunctivae normal.     Pupils: Pupils are equal, round, and reactive to light.  Neck:     Thyroid: No thyromegaly.     Vascular: No JVD.     Trachea: No tracheal deviation.  Cardiovascular:     Rate and Rhythm: Normal rate and regular rhythm.     Heart sounds: Normal heart sounds. No murmur heard.    No friction rub. No gallop.  Pulmonary:     Effort: Pulmonary effort is normal. Prolonged expiration present. No accessory muscle usage or respiratory distress.     Breath sounds: Decreased air movement present. No stridor. Rhonchi and rales present. No wheezing.  Chest:     Chest wall: No tenderness.  Musculoskeletal:     Cervical back: Normal range of motion and neck supple.  Lymphadenopathy:      Cervical: No cervical adenopathy.  Skin:    General: Skin is warm.     Coloration: Skin is not pale.     Findings: No erythema or rash.  Neurological:     Mental Status: He is alert and oriented to person, place, and time.     Cranial Nerves: No cranial nerve deficit.     Motor: No abnormal muscle tone.     Coordination: Coordination normal.     Deep Tendon Reflexes: Reflexes are normal and symmetric.  Psychiatric:        Behavior: Behavior normal.        Thought Content: Thought content normal.        Judgment: Judgment normal.           Assessment & Plan:  Pneumonia of right lower lobe due to infectious organism  Bronchiectasis with acute lower respiratory infection ([place])  Interstitial lung disease ([place]) At this point, I do not feel additional antibiotics are necessary.  Patient is afebrile his pulse oximetry is outstanding.  We need to focus on pulmonary toilet and trying to help him cough up the mucus.  I recommended using the flutter valve.  I recommended adding Mucinex to his medication regimen.  Also recommended using Breztri to improve his vital capacity to improve mucus clearance.  At this point I would have to defer to his pulmonary specialist any additional treatments for interstitial lung disease that may exist.  He respectfully requests a transfer to a different pulmonologist due to trouble with communication.   I will contact [place] pulmonology and request this on his behalf.

## 2022-04-23 ENCOUNTER — Other Ambulatory Visit: Payer: Self-pay

## 2022-04-23 DIAGNOSIS — Z23 Encounter for immunization: Secondary | ICD-10-CM

## 2022-05-01 ENCOUNTER — Telehealth: Payer: Self-pay | Admitting: Pulmonary Disease

## 2022-05-01 NOTE — Telephone Encounter (Signed)
Received referral from Dr [name]. Pt is requesting to transfer care from Dr Ander Slade to Dr Chase Caller. ILD is listed as a dx in this referral. Please advise.

## 2022-05-01 NOTE — Telephone Encounter (Signed)
Dr. Ander Slade and Dr. Chase Caller. Are you fine with this switch. Please advise

## 2022-05-01 NOTE — Telephone Encounter (Signed)
  Happy to see  Plan  - do ful PFT  - do ILD questionnaire  - do blod , ANA, DS-DNA, RF, anti-CCP,  Quantiferon Gold & Hypersensitivity Pneumonitis Panel      PFT     Latest Ref Rng & Units 12/12/2015   10:53 AM  PFT Results  FVC-Pre L 2.86   FVC-Predicted Pre % 70   Pre FEV1/FVC % % 80   FEV1-Pre L 2.28   FEV1-Predicted Pre % 77   DLCO uncorrected ml/min/mmHg 26.92   DLCO UNC% % 86   DLVA Predicted % 122   TLC L 5.07   TLC % Predicted % 74   RV % Predicted % 73      HRCT 04/04/22  arrative & Impression  CLINICAL DATA:  Follow-up lung nodule seen on radiographs   EXAM: CT CHEST WITHOUT CONTRAST   TECHNIQUE: Multidetector CT imaging of the chest was performed following the standard protocol without IV contrast.   RADIATION DOSE REDUCTION: This exam was performed according to the departmental dose-optimization program which includes automated exposure control, adjustment of the mA and/or kV according to patient size and/or use of iterative reconstruction technique.   COMPARISON:  Radiographs 03/19/2022 and CTA chest 09/28/2021   FINDINGS: Cardiovascular: Normal heart size. No pericardial effusion. Aortic atherosclerotic calcification. The ascending aorta measures approximately 44 mm in diameter (coronal image 86) this previously measured 41 mm on CTA 09/28/2021 though direct comparison is limited by lack of IV contrast.   Mediastinum/Nodes: Increased size of multiple mediastinal lymph nodes. For example a right paratracheal node measures 1.0 cm (3/33), previously 0.6 cm. Unremarkable esophagus.   Lungs/Pleura: Patchy reticulonodular opacities and interstitial thickening are slightly progressed from 09/28/2021. Increased bronchial wall thickening and bronchiectasis/bronchiolectasis greatest in the right lower lobe. Focal atelectasis/consolidation in the posterior right lower lobe. No pleural effusion or pneumothorax. No CT correlate for nodule seen on  radiographs 03/19/2022.   Upper Abdomen: Cholecystectomy. Colonic diverticulosis without diverticulitis. No acute abnormality   Musculoskeletal: No chest wall mass or suspicious bone lesions identified.   IMPRESSION: 1. Pulmonary findings compatible with bronchitis/bronchiolitis with right lower lobe pneumonia. 2. Similar to slight progression of interstitial lung disease compared with 09/28/2021. 3. Ascending aortic aneurysm measures 44 mm in diameter on today's exam. This may be slightly increased from 09/28/2021 however comparison is difficult given differences in obliquity and lack of IV contrast. Continued annual imaging follow-up with CTA or MRA is recommended.     Electronically Signed   By: Placido Sou M.D.   On: 04/08/2022 00:02

## 2022-05-02 NOTE — Telephone Encounter (Signed)
Okay with me 

## 2022-05-03 NOTE — Telephone Encounter (Signed)
Spoke with pt and scheduled him for a New Patient/ ILD consult with Dr. Chase Caller on 05/16/22 at [place]. Mailed ILD paperwork to verified address. Nothing further needed at this time.

## 2022-05-11 ENCOUNTER — Other Ambulatory Visit: Payer: Self-pay | Admitting: Family Medicine

## 2022-05-13 NOTE — Telephone Encounter (Signed)
Requested Prescriptions  Pending Prescriptions Disp Refills   EQ ALLERGY RELIEF, CETIRIZINE, 10 MG tablet [Pharmacy Med Name: EQ Allergy Relief (Cetirizine) 10 MG Oral Tablet] 90 tablet 0    Sig: Take 1 tablet by mouth once daily     Ear, Nose, and Throat:  Antihistamines 2 Failed - 05/11/2022 11:35 AM      Failed - Cr in normal range and within 360 days    Creat  Date Value Ref Range Status  05/01/2021 1.49 (H) 0.70 - 1.22 mg/dL Final   Creatinine, Ser  Date Value Ref Range Status  09/26/2021 1.42 (H) 0.76 - 1.27 mg/dL Final         Failed - Valid encounter within last 12 months    Recent Outpatient Visits           1 year ago Prediabetes   [place] [name], MD   1 year ago Viral upper respiratory tract infection   [place] [name], MD   2 years ago Need for prophylactic vaccination and inoculation against influenza   [place] [name], [name], MD   2 years ago Prediabetes   [place] [name], [name], MD   2 years ago [place] [name], [name], MD

## 2022-05-16 ENCOUNTER — Other Ambulatory Visit (INDEPENDENT_AMBULATORY_CARE_PROVIDER_SITE_OTHER): Payer: Medicare Other

## 2022-05-16 ENCOUNTER — Ambulatory Visit: Payer: Medicare Other | Admitting: Internal Medicine

## 2022-05-16 ENCOUNTER — Encounter: Payer: Self-pay | Admitting: Internal Medicine

## 2022-05-16 VITALS — BP 142/72 | HR 86 | Temp 98.2°F | Ht 67.0 in | Wt 291.6 lb

## 2022-05-16 DIAGNOSIS — R0609 Other forms of dyspnea: Secondary | ICD-10-CM

## 2022-05-16 DIAGNOSIS — J849 Interstitial pulmonary disease, unspecified: Secondary | ICD-10-CM

## 2022-05-16 DIAGNOSIS — R053 Chronic cough: Secondary | ICD-10-CM

## 2022-05-16 LAB — CBC WITH DIFFERENTIAL/PLATELET
Basophils Absolute: 0.1 10*3/uL (ref 0.0–0.1)
Basophils Relative: 0.8 % (ref 0.0–3.0)
Eosinophils Absolute: 0.5 10*3/uL (ref 0.0–0.7)
Eosinophils Relative: 4.9 % (ref 0.0–5.0)
HCT: 40.6 % (ref 39.0–52.0)
Hemoglobin: 13.3 g/dL (ref 13.0–17.0)
Lymphocytes Relative: 27.4 % (ref 12.0–46.0)
Lymphs Abs: 3.1 10*3/uL (ref 0.7–4.0)
MCHC: 32.7 g/dL (ref 30.0–36.0)
MCV: 90.4 fl (ref 78.0–100.0)
Monocytes Absolute: 1.1 10*3/uL — ABNORMAL HIGH (ref 0.1–1.0)
Monocytes Relative: 10.3 % (ref 3.0–12.0)
Neutro Abs: 6.3 10*3/uL (ref 1.4–7.7)
Neutrophils Relative %: 56.6 % (ref 43.0–77.0)
Platelets: 338 10*3/uL (ref 150.0–400.0)
RBC: 4.49 Mil/uL (ref 4.22–5.81)
RDW: 14.3 % (ref 11.5–15.5)
WBC: 11.1 10*3/uL — ABNORMAL HIGH (ref 4.0–10.5)

## 2022-05-16 LAB — HEPATIC FUNCTION PANEL
ALT: 13 U/L (ref 0–53)
AST: 15 U/L (ref 0–37)
Albumin: 4.3 g/dL (ref 3.5–5.2)
Alkaline Phosphatase: 73 U/L (ref 39–117)
Bilirubin, Direct: 0.1 mg/dL (ref 0.0–0.3)
Total Bilirubin: 0.3 mg/dL (ref 0.2–1.2)
Total Protein: 7.2 g/dL (ref 6.0–8.3)

## 2022-05-16 LAB — SEDIMENTATION RATE: Sed Rate: 49 mm/hr — ABNORMAL HIGH (ref 0–20)

## 2022-05-16 NOTE — Progress Notes (Signed)
OV 05/16/2022 -transfer of care to the ILD center with Dr. Marchelle Gearing.    Subjective:  Patient ID: William Howell, male , DOB: 1940/10/06 , age 82 y.o. , MRN: [id number] , ADDRESS: [id number] [place] [address] PCP Donita Brooks, MD Patient Care Team: Donita Brooks, MD as PCP - General (Family Medicine) Rennis Golden Lisette Abu, MD as PCP - Cardiology (Cardiology) Rennis Golden Lisette Abu, MD as Consulting Physician (Cardiology)  This Provider for this visit: Treatment Team:  Attending Provider: Kalman Shan, MD    05/16/2022 -   Chief Complaint  Patient presents with   Consult    Cough,      HPI William Howell 82 y.o. -is maternal grandfather to Dr. Vilma Meckel pulmonologist at [facility] health.  He is transferring his care to Dr. Marchelle Gearing [me] because of concern of interstitial lung disease.  He is a lifelong former and single.  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  He does a lot of heavy manual work.  He has significant exposures that because of his farm work.  He has also obesity and has sleep apnea.  He uses CPAP at night previously followed by Dr. Val Eagle (currently unclear who his new sleep doctor will be].  He reports dyspnea on exertion for at least the last 10-12 years but for the last 1 year it is progressive.  It is relieved by rest.  However since October 2023 has had a cough it initially got better but then November 2023 it came back again.  He recollects being treated for pneumonia.  Around this time in December 2023 a few days before Christmas he did have a CT scan of the chest that I personally visualized it does show right lower lobe density consistent with pneumonia].  Since then after treatment of pneumonia his cough is better but is still not back at baseline.  His shortness of breath is also not back at baseline.  Regarding his cough overall it was worse compared to a year ago although better compared to fall 2023.  He does bring out  some occasional clear to light yellow phlegm.  He does cough at night.  He does also have wheezing with the cough he does clear the throat but there is no hemoptysis.  The cough does not get worse when he lies down.  There is no tickle in his throat.  [facility] Comprehensive ILD Questionnaire  Symptoms:   SYMPTOM SCALE - ILD 05/16/2022  Current weight   O2 use ra  Shortness of Breath 0 -> 5 scale with 5 being worst (score 6 If unable to do)  At rest 1  Simple tasks - showers, clothes change, eating, shaving 2  Household (dishes, doing bed, laundry) 4  Shopping 3  Walking level at own pace 4  Walking up Stairs 4  Total (30-36) Dyspnea Score 11      Non-dyspnea symptoms (0-> 5 scale) 05/16/2022  How bad is your cough? 3  How bad is your fatigue 4  How bad is nausea 0  How bad is vomiting?  0  How bad is diarrhea? 0  How bad is anxiety? 1  How bad is depression 1  Any chronic pain - if so where and how bad 1     Past Medical History :  -Obesity -Sleep apnea on CPAP -Review of the external records indicate that in 2019 he was being treated for asthma by Dr.  Douglas Mcquaid. -Denies any connective tissue disease -Has hypertension Pneumonia right lower lobe December 2023 -He has had both COVID-vaccine and COVID disease but was never hospitalized for COVID.   ROS:  --Fatigue Shortness of breath Chronic cough Some weight loss Does not snore because of CPAP -   FAMILY HISTORY of LUNG DISEASE:  -No family history of pulmonary fibrosis but his father did have COPD  PERSONAL EXPOSURE HISTORY:  -Non-smoker.  No marijuana use.  No cocaine use no intravenous drug use  HOME  EXPOSURE and HOBBY DETAILS :  -Lives and is fine to take a farm house single[place].  The home is 82 years old.  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  Since 1970s has been active farming.  He has been exposed to working in [place].  Is been a daily farmer a lot of chemical exposure.  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  He is also done sewage work.  None animal feed production.  Been a consideration is.  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  Has done foundry work, Freight forwarder, Paramedic, Glass blower/designer, woodwork presents on 5 working.  Has been exposed to pesticides.  PULMONARY TOXICITY HISTORY (27 items):  -He has been on and off prednisone for years but otherwise detailed pulmonary drug toxicity is negative  INVESTIGATIONS: -Last pulmonary function test in 2017 with reduced FVC but normal DLCO.  This is consistent with obesity. -CT chest without contrast December 2023:  -He does have right lower lobe pneumonia  -He does have ILD that I suspect is in a pattern not consistent with UIP/alternative to UIP pattern  -Onset: In 2017 CT scan of the chest he did not seem to have it.  However 2022 he has some ILD changes. -   CT Chest data - CTWO contrast 04/04/22  Narrative & Impression  CLINICAL DATA:  Follow-up lung nodule seen on radiographs   EXAM: CT CHEST WITHOUT CONTRAST   TECHNIQUE: Multidetector CT imaging of the chest was performed following the standard protocol without IV contrast.   RADIATION DOSE REDUCTION: This exam was performed according to the departmental dose-optimization program which includes automated exposure control, adjustment of the mA and/or kV according to patient size and/or use of iterative reconstruction technique.   COMPARISON:  Radiographs 03/19/2022 and CTA chest 09/28/2021   FINDINGS: Cardiovascular: Normal heart size. No pericardial  effusion. Aortic atherosclerotic calcification. The ascending aorta measures approximately 44 mm in diameter (coronal image 86) this previously measured 41 mm on CTA 09/28/2021 though direct comparison is limited by lack of IV contrast.   Mediastinum/Nodes: Increased size of multiple mediastinal lymph nodes. For example a right paratracheal node measures 1.0 cm (3/33), previously 0.6 cm. Unremarkable esophagus.   Lungs/Pleura: Patchy reticulonodular opacities and interstitial thickening are slightly progressed from 09/28/2021. Increased bronchial wall thickening and bronchiectasis/bronchiolectasis greatest in the right lower lobe. Focal atelectasis/consolidation in the posterior right lower lobe. No pleural effusion or pneumothorax. No CT correlate for nodule seen on radiographs 03/19/2022.   Upper Abdomen: Cholecystectomy. Colonic diverticulosis without diverticulitis. No acute abnormality   Musculoskeletal: No chest wall mass or suspicious bone  lesions identified.   IMPRESSION: 1. Pulmonary findings compatible with bronchitis/bronchiolitis with right lower lobe pneumonia. 2. Similar to slight progression of interstitial lung disease compared with 09/28/2021. 3. Ascending aortic aneurysm measures 44 mm in diameter on today's exam. This may be slightly increased from 09/28/2021 however comparison is difficult given differences in obliquity and lack of IV contrast. Continued annual imaging follow-up with CTA or MRA is recommended.     Electronically Signed   By: Minerva Fester M.D.   On: 04/08/2022 00:02    No results found.   PFT     Latest Ref Rng & Units 12/12/2015   10:53 AM  PFT Results  FVC-Pre L 2.86   FVC-Predicted Pre % 70   Pre FEV1/FVC % % 80   FEV1-Pre L 2.28   FEV1-Predicted Pre % 77   DLCO uncorrected ml/min/mmHg 26.92   DLCO UNC% % 86   DLVA Predicted % 122   TLC L 5.07   TLC % Predicted % 74   RV % Predicted % 73      Latest Reference Range  & Units 01/15/16 11:41  Sheep Sorrel IgE kU/L <0.10  Pecan/Hickory Tree IgE kU/L <0.10  IgE (Immunoglobulin E), Serum <115 kU/L 72  Allergen, D pternoyssinus,d7 kU/L <0.10  Cat Dander kU/L <0.10  Dog Dander kU/L <0.10  [place] Grass kU/L <0.10  Johnson Grass kU/L <0.10  Timothy Grass kU/L <0.10  Cockroach kU/L <0.10  Aspergillus fumigatus, m3 kU/L <0.10  Allergen, Comm Silver Charletta Cousin, t9 kU/L <0.10  Allergen, Cottonwood, t14 kU/L <0.10  Elm IgE kU/L <0.10  Allergen, Mulberry, t76 kU/L <0.10  Allergen, Oak,t7 kU/L <0.10  Common Ragweed kU/L <0.10  Allergen, Mouse Urine Protein, e78 kU/L <0.10  D. farinae kU/L <0.10  Allergen, Cedar tree, t12 kU/L <0.10  Box Elder IgE kU/L <0.10  Rough Pigweed  IgE kU/L <0.10    Latest Reference Range & Units 06/30/14 08:24 12/12/15 10:54 02/19/16 09:18 05/09/17 08:57 02/12/18 09:08 01/16/19 16:21 07/19/19 08:41 02/21/20 08:15 05/01/21 09:11 09/26/21 10:08  Creatinine 0.76 - 1.27 mg/dL 5.00 3.70 4.88 (H) 8.91 (H) 1.33 (H) 1.31 (H) 1.29 (H) 1.26 (H) 1.49 (H) 1.42 (H)  (H): Data is abnormally high   Latest Reference Range & Units 06/30/14 08:24 12/12/15 10:54 07/22/16 10:11 05/09/17 08:57 02/12/18 09:08 01/16/19 16:21 02/21/20 08:15 05/01/21 09:11  Hemoglobin 13.2 - 17.1 g/dL 69.4 50.3 88.8 28.0 03.4 13.2 13.8 13.5    has a past medical history of Allergy, Asthma, Diabetes mellitus type II, controlled (HCC), GERD (gastroesophageal reflux disease), Hypertension, Obesity, and Sleep apnea.   reports that he has never smoked. He has quit using smokeless tobacco.  His smokeless tobacco use included chew.  Past Surgical History:  Procedure Laterality Date   CHOLECYSTECTOMY     DENTAL SURGERY      No Known Allergies  Immunization History  Administered Date(s) Administered   Fluad Quad(high Dose 65+) 01/04/2019, 03/27/2020, 04/22/2022   Influenza, High Dose Seasonal PF 02/17/2017   Influenza,inj,Quad PF,6+ Mos 02/10/2014, 03/14/2015, 01/09/2016,  02/12/2018   Influenza-Unspecified 02/12/2018   PFIZER(Purple Top)SARS-COV-2 Vaccination 07/10/2019, 08/04/2019   Pneumococcal Conjugate-13 07/04/2014   Pneumococcal Polysaccharide-23 12/26/2011   Td 12/26/2011   Tdap 12/26/2011   Zoster Recombinat (Shingrix) 02/20/2021, 04/18/2021   Zoster, Live 02/10/2014    Family History  Problem Relation Age of Onset   Heart disease Father    Heart attack Father    Heart attack Sister    Colon cancer Neg Hx  Colon polyps Neg Hx    Esophageal cancer Neg Hx    Rectal cancer Neg Hx    Stomach cancer Neg Hx    Pancreatic cancer Neg Hx    Liver cancer Neg Hx    Prostate cancer Neg Hx      Current Outpatient Medications:    aspirin EC 81 MG tablet, Take 81 mg by mouth daily., Disp: , Rfl:    B Complex Vitamins (B COMPLEX 1 PO), Take by mouth., Disp: , Rfl:    Budeson-Glycopyrrol-Formoterol (BREZTRI AEROSPHERE) 160-9-4.8 MCG/ACT AERO, Inhale 2 puffs into the lungs 2 (two) times daily., Disp: 10.7 g, Rfl: 11   Calcium Carb-Cholecalciferol (CALCIUM 1000 + D PO), Take 1 tablet by mouth 2 (two) times daily., Disp: , Rfl:    EQ ALLERGY RELIEF, CETIRIZINE, 10 MG tablet, Take 1 tablet by mouth once daily, Disp: 90 tablet, Rfl: 0   glucosamine-chondroitin 500-400 MG tablet, Take 1 tablet 2 (two) times daily by mouth., Disp: , Rfl:    losartan (COZAAR) 50 MG tablet, Take 1 tablet (50 mg total) by mouth daily., Disp: 90 tablet, Rfl: 3   metFORMIN (GLUCOPHAGE) 500 MG tablet, TAKE 1 TABLET BY MOUTH TWICE DAILY WITH  A  MEAL, Disp: 180 tablet, Rfl: 3   Multiple Vitamins-Minerals (MULTIVITAMIN WITH MINERALS) tablet, Take 1 tablet daily by mouth., Disp: , Rfl:    Omega-3 Fatty Acids (FISH OIL) 1000 MG CAPS, Take 1 capsule by mouth daily., Disp: , Rfl:    omeprazole (PRILOSEC) 40 MG capsule, Take 1 capsule by mouth once daily, Disp: 90 capsule, Rfl: 0   SYMBICORT 160-4.5 MCG/ACT inhaler, Inhale 2 puffs by mouth twice daily, Disp: 11 g, Rfl: 3   tamsulosin  (FLOMAX) 0.4 MG CAPS capsule, Take 1 capsule by mouth once daily, Disp: 90 capsule, Rfl: 3   VENTOLIN HFA 108 (90 Base) MCG/ACT inhaler, INHALE 1 TO 2 PUFFS BY MOUTH EVERY 6 HOURS AS NEEDED, Disp: 18 g, Rfl: 0   zafirlukast (ACCOLATE) 20 MG tablet, TAKE 1 TABLET BY MOUTH TWICE DAILY BEFORE A MEAL . APPOINTMENT REQUIRED FOR FUTURE REFILLS, Disp: 180 tablet, Rfl: 3  Current Facility-Administered Medications:    0.9 %  sodium chloride infusion, 500 mL, Intravenous, Once, Gatha Mayer, MD      Objective:   Vitals:   05/16/22 0857  BP: (!) 142/72  Pulse: 86  Temp: 98.2 F (36.8 C)  TempSrc: Oral  SpO2: 96%  Weight: 291 lb 9.6 oz (132.3 kg)  Height: 5\' 7"  (1.702 m)    Estimated body mass index is 45.67 kg/m as calculated from the following:   Height as of this encounter: 5\' 7"  (1.702 m).   Weight as of this encounter: 291 lb 9.6 oz (132.3 kg).  @WEIGHTCHANGE @  [employer]   05/16/22 0857  Weight: 291 lb 9.6 oz (132.3 kg)     Physical Exam    General: No distress. Obese.Looks well Neuro: Alert and Oriented x 3. GCS 15. Speech normal Psych: Pleasant Resp:  Barrel Chest - no.  Wheeze - no, Crackles - YES, No overt respiratory distress CVS: Normal heart sounds. Murmurs - no Ext: Stigmata of Connective Tissue Disease - no HEENT: Normal upper airway. PEERL +. No post nasal drip        Assessment:       ICD-10-CM   1. ILD (interstitial lung disease) (HCC)  J84.9 Pulmonary function test    Sed Rate (ESR)    Antinuclear Antib (ANA)  ANA+ENA+DNA/DS+Scl 70+SjoSSA/B    Rheumatoid Factor    Cyclic citrul peptide antibody, IgG    Anti-scleroderma antibody    CK Total (and CKMB)    Aldolase    Hypersensitivity Pneumonitis    QuantiFERON-TB Gold Plus    Sjogrens syndrome-A extractable nuclear antibody    Sjogrens syndrome-B extractable nuclear antibody    CT Chest High Resolution    Hepatic function panel    CBC with Differential    AMB referral to pulmonary  rehabilitation    CANCELED: CT Chest High Resolution    2. DOE (dyspnea on exertion)  R06.09 ECHOCARDIOGRAM COMPLETE    3. Chronic cough  R05.3      He is at risk candidate for idiopathic pulmonary fibrosis because of his age greater than 29, male gender and significant work-related exposures.  And also basal crackles.  However his CT scan of the chest is not consistent with it.  Radiologist feels there is some progression but I am not so sure.  In addition to CT chest does not appear to have a pattern consistent with UIP/IPF.  At this point in time I think the best thing is to ensure the resolution of the pneumonia and to also initiate workup for his ILD in terms of serology and hypersensitive pneumonitis panel.  After that get a CT scan of the chest to make sure the pneumonia is resolved and also get a high-resolution CT chest to get a better definition of the ILD.  Based on this her to talk about antifibrotic therapy versus wait and watch versus some form for lung biopsy.  Given his comorbidities I will probably avoid surgical lung biopsy if however he went on that route.  Will also get echocardiogram.  Will refer him to pulmonary rehabilitation.  Also encouraged him to talk about weight loss medications with his primary care doctor Plan:     Patient Instructions     ICD-10-CM   1. ILD (interstitial lung disease) (HCC)  J84.9 Pulmonary function test    Sed Rate (ESR)    Antinuclear Antib (ANA)    ANA+ENA+DNA/DS+Scl 70+SjoSSA/B    Rheumatoid Factor    Cyclic citrul peptide antibody, IgG    Anti-scleroderma antibody    CK Total (and CKMB)    Aldolase    Hypersensitivity Pneumonitis    QuantiFERON-TB Gold Plus    Sjogrens syndrome-A extractable nuclear antibody    Sjogrens syndrome-B extractable nuclear antibody    CT Chest High Resolution    CANCELED: CT Chest High Resolution    2. DOE (dyspnea on exertion)  R06.09     3. Chronic cough  R05.3         - I am concerned you   have Interstitial Lung Disease (ILD)  -  There are MANY varieties of this - To narrow down possibilities and assess severity please do the following tests  - do full PFT (last in 2017)  - do High Resolution CT chest wo contrast - supine and prone, inspiratory and expiratory images   - do CT End of feb 2024/early march 2024 (2 month followup)  - do autoimmune panel: Serum: ESR, ANA, DS-DNA, RF, anti-CCP, ssA, ssB, scl-70, Total CK,  Aldolase,  Hypersensitivity Pneumonitis Panel, Quantiferon Gold  - do cbc wtith diff, bmet, lft, bnp  - do echo - anytime next few to several weeks  - refer pulmonary rehab at [facility]  - Talk to PCP Donita Brooks, MD about weight loss drug  Followup  -  early march 2024 but after  completing all of the above; 30 min visit with Dr Chase Caller  ( Level 05 visit: Estb 40-54 min   in  visit type: on-site physical face to visit  in total care time and counseling or/and coordination of care by this undersigned MD - Dr Brand Males. This includes one or more of the following on this same day 05/16/2022: pre-charting, chart review, note writing, documentation discussion of test results, diagnostic or treatment recommendations, prognosis, risks and benefits of management options, instructions, education, compliance or risk-factor reduction. It excludes time spent by the [place] or office staff in the care of the patient. Actual time 42 min)    SIGNATURE    Dr. Brand Males, M.D., F.C.C.P,  Pulmonary and Critical Care Medicine Staff Physician, [place] Director - Interstitial Lung Disease  Program  [facility] [place], [place], [address]  Pager: [phone], If no answer or between  15:00h - 7:00h: call [phone] Telephone: [phone]  2:34 PM 05/16/2022

## 2022-05-16 NOTE — Patient Instructions (Addendum)
ICD-10-CM   1. ILD (interstitial lung disease) (HCC)  J84.9 Pulmonary function test    Sed Rate (ESR)    Antinuclear Antib (ANA)    ANA+ENA+DNA/DS+Scl 70+SjoSSA/B    Rheumatoid Factor    Cyclic citrul peptide antibody, IgG    Anti-scleroderma antibody    CK Total (and CKMB)    Aldolase    Hypersensitivity Pneumonitis    QuantiFERON-TB Gold Plus    Sjogrens syndrome-A extractable nuclear antibody    Sjogrens syndrome-B extractable nuclear antibody    CT Chest High Resolution    CANCELED: CT Chest High Resolution    2. DOE (dyspnea on exertion)  R06.09     3. Chronic cough  R05.3         - I am concerned you  have Interstitial Lung Disease (ILD)  -  There are MANY varieties of this - To narrow down possibilities and assess severity please do the following tests  - do full PFT (last in 2017)  - do High Resolution CT chest wo contrast - supine and prone, inspiratory and expiratory images   - do CT End of feb 2024/early march 2024 (2 month followup)  - do autoimmune panel: Serum: ESR, ANA, DS-DNA, RF, anti-CCP, ssA, ssB, scl-70, Total CK,  Aldolase,  Hypersensitivity Pneumonitis Panel, Quantiferon Gold  - do cbc wtith diff, bmet, lft, bnp  - do echo - anytime next few to several weeks  - refer pulmonary rehab at [facility]  - Talk to PCP [name], MD about weight loss drug  Followup  - early march 2024 but after  completing all of the above; 30 min visit with Dr Chase Caller

## 2022-05-17 ENCOUNTER — Telehealth: Payer: Self-pay

## 2022-05-17 NOTE — Telephone Encounter (Signed)
Pt called this morning, would like for pcp to please review his pulmonologist result/resport from his visit and to call pt back w/pcp's opinions/thoughts.

## 2022-05-18 ENCOUNTER — Other Ambulatory Visit: Payer: Self-pay | Admitting: Family Medicine

## 2022-05-20 ENCOUNTER — Other Ambulatory Visit: Payer: Self-pay

## 2022-05-20 DIAGNOSIS — E119 Type 2 diabetes mellitus without complications: Secondary | ICD-10-CM

## 2022-05-20 LAB — ANTI-NUCLEAR AB-TITER (ANA TITER)
ANA TITER: 1:80 {titer} — ABNORMAL HIGH
ANA Titer 1: 1:40 {titer} — ABNORMAL HIGH

## 2022-05-20 LAB — CK TOTAL AND CKMB (NOT AT ARMC)
CK, MB: <0.7 ng/mL (ref 0–5.0)
Total CK: 66 U/L (ref 44–196)

## 2022-05-20 LAB — ANA: Anti Nuclear Antibody (ANA): POSITIVE — AB

## 2022-05-20 LAB — RHEUMATOID FACTOR: Rheumatoid fact SerPl-aCnc: <14 IU/mL (ref <14)

## 2022-05-20 LAB — QUANTIFERON-TB GOLD PLUS
Mitogen-NIL: >10 IU/mL
NIL: 0.01 IU/mL
QuantiFERON-TB Gold Plus: NEGATIVE
TB1-NIL: <0 IU/mL
TB2-NIL: 0.01 IU/mL

## 2022-05-20 LAB — CYCLIC CITRUL PEPTIDE ANTIBODY, IGG: Cyclic Citrullin Peptide Ab: <16 UNITS

## 2022-05-20 LAB — ANTI-SCLERODERMA ANTIBODY: Scleroderma (Scl-70) (ENA) Antibody, IgG: <1 AI

## 2022-05-20 LAB — ALDOLASE: Aldolase: 4 U/L (ref <=8.1)

## 2022-05-20 LAB — SJOGRENS SYNDROME-A EXTRACTABLE NUCLEAR ANTIBODY: SSA (Ro) (ENA) Antibody, IgG: <1 AI

## 2022-05-20 MED ORDER — OZEMPIC (0.25 OR 0.5 MG/DOSE) 2 MG/3ML ~~LOC~~ SOPN: 0.5000 mg | PEN_INJECTOR | SUBCUTANEOUS | 1 refills | Status: DC

## 2022-05-22 LAB — HYPERSENSITIVITY PNEUMONITIS
A. Pullulans Abs: NEGATIVE
A.Fumigatus #1 Abs: NEGATIVE
Micropolyspora faeni, IgG: NEGATIVE
Pigeon Serum Abs: NEGATIVE
Thermoact. Saccharii: NEGATIVE
Thermoactinomyces vulgaris, IgG: NEGATIVE

## 2022-05-24 ENCOUNTER — Other Ambulatory Visit: Payer: Self-pay | Admitting: Family Medicine

## 2022-05-24 NOTE — Telephone Encounter (Signed)
Requested medication (s) are due for refill today - expired Rx  Requested medication (s) are on the active medication list -yes  Future visit scheduled -no  Last refill: 05/17/21 #180 3RF  Notes to clinic: expired Rx  Requested Prescriptions  Pending Prescriptions Disp Refills   zafirlukast (ACCOLATE) 20 MG tablet [Pharmacy Med Name: Zafirlukast 20 MG Oral Tablet] 180 tablet 0    Sig: TAKE 1 TABLET BY MOUTH TWICE DAILY BEFORE A MEAL . APPOINTMENT REQUIRED FOR FUTURE REFILLS     Pulmonology:  Leukotriene Inhibitors Failed - 05/24/2022 12:46 PM      Failed - Valid encounter within last 12 months    Recent Outpatient Visits           1 year ago Prediabetes   [place] [name], [name], MD   1 year ago Viral upper respiratory tract infection   [place] [name], MD   2 years ago Need for prophylactic vaccination and inoculation against influenza   [place] [name], [name], MD   2 years ago Prediabetes   [place] [name], [name], MD   2 years ago [place], [name], MD       Future Appointments             In 1 month Brand Males, MD [place] [facility] Prescriptions  Pending Prescriptions Disp Refills   zafirlukast (ACCOLATE) 20 MG tablet [Pharmacy Med Name: Zafirlukast 20 MG Oral Tablet] 180 tablet 0    Sig: TAKE 1 TABLET BY MOUTH TWICE DAILY BEFORE A MEAL . APPOINTMENT REQUIRED FOR FUTURE REFILLS     Pulmonology:  Leukotriene Inhibitors Failed - 05/24/2022 12:46 PM      Failed - Valid encounter within last 12 months    Recent Outpatient Visits           1 year ago Prediabetes   [place] [name], [name], MD   1 year ago Viral upper respiratory tract infection   [place] [name], MD   2 years ago Need for prophylactic  vaccination and inoculation against influenza   [place] [name], [name], MD   2 years ago Prediabetes   [place] [name], [name], MD   2 years ago [place] [name], [name], MD       Future Appointments             In 1 month Brand Males, MD [facility] at [facility]

## 2022-05-29 DIAGNOSIS — K080 Exfoliation of teeth due to systemic causes: Secondary | ICD-10-CM | POA: Diagnosis not present

## 2022-05-30 ENCOUNTER — Telehealth: Payer: Self-pay

## 2022-05-30 NOTE — Telephone Encounter (Signed)
PA for Ozempic:  William Howell (Key: [id number])  Your information has been submitted to [place]. [employer] [place] will review the request and notify you of the determination decision directly, typically within 3 business days of your submission and once all necessary information is received.  You will also receive your request decision electronically. To check for an update later, open the request again from your dashboard.  If [employer] [place] has not responded within the specified timeframe or if you have any questions about your PA submission, contact [place] directly at [facility]) [phone] or ([place]) [phone].

## 2022-06-12 ENCOUNTER — Ambulatory Visit
Admission: RE | Admit: 2022-06-12 | Discharge: 2022-06-12 | Disposition: A | Discharge status: Home / Self Care | Payer: Medicare Other | Source: Ambulatory Visit | Attending: Internal Medicine | Admitting: Internal Medicine

## 2022-06-12 DIAGNOSIS — J929 Pleural plaque without asbestos: Secondary | ICD-10-CM | POA: Diagnosis not present

## 2022-06-12 DIAGNOSIS — J84112 Idiopathic pulmonary fibrosis: Secondary | ICD-10-CM | POA: Diagnosis not present

## 2022-06-12 DIAGNOSIS — I700 Atherosclerosis of aorta: Secondary | ICD-10-CM | POA: Diagnosis not present

## 2022-06-12 DIAGNOSIS — R918 Other nonspecific abnormal finding of lung field: Secondary | ICD-10-CM | POA: Diagnosis not present

## 2022-06-12 DIAGNOSIS — J849 Interstitial pulmonary disease, unspecified: Secondary | ICD-10-CM

## 2022-06-13 ENCOUNTER — Other Ambulatory Visit: Payer: Self-pay | Admitting: Family Medicine

## 2022-06-13 NOTE — Telephone Encounter (Signed)
Requested Prescriptions  Pending Prescriptions Disp Refills   tamsulosin (FLOMAX) 0.4 MG CAPS capsule [Pharmacy Med Name: Tamsulosin HCl 0.4 MG Oral Capsule] 90 capsule 0    Sig: Take 1 capsule by mouth once daily     Urology: Alpha-Adrenergic Blocker Failed - 06/13/2022  7:09 AM      Failed - PSA in normal range and within 360 days    PSA  Date Value Ref Range Status  05/01/2021 4.35 (H) < OR = 4.00 ng/mL Final    Comment:    The total PSA value from this assay system is  standardized against the WHO standard. The test  result will be approximately 20% lower when compared  to the equimolar-standardized total PSA (Beckman  Coulter). Comparison of serial PSA results should be  interpreted with this fact in mind. . This test was performed using the Siemens  chemiluminescent method. Values obtained from  different assay methods cannot be used interchangeably. PSA levels, regardless of value, should not be interpreted as absolute evidence of the presence or absence of disease.          Failed - Last BP in normal range    BP Readings from Last 1 Encounters:  05/16/22 (!) 142/72         Failed - Valid encounter within last 12 months    Recent Outpatient Visits           1 year ago Prediabetes   [place] [facility], William Mcgee, MD   1 year ago Viral upper respiratory tract infection   [facility] William Mcgee, MD   2 years ago Need for prophylactic vaccination and inoculation against influenza   [place] [facility], William Mcgee, MD   2 years ago Prediabetes   William [facility], William Mcgee, MD   2 years ago William [place]ell, William Mcgee, MD       Future Appointments             In 2 weeks William Males, MD [facility]             omeprazole William [facility]ell) 40 MG capsule [Pharmacy Med Name: Omeprazole 40 MG Oral Capsule Delayed Release]  90 capsule 3    Sig: Take 1 capsule by mouth once daily     Gastroenterology: Proton Pump Inhibitors Failed - 06/13/2022  7:09 AM      Failed - Valid encounter within last 12 months    Recent Outpatient Visits           1 year ago Prediabetes   [place] [name], MD   1 year ago Viral upper respiratory tract infection   [place] [name], MD   2 years ago Need for prophylactic vaccination and inoculation against influenza   [facility], William Mcgee, MD   2 years ago Prediabetes   [place] [facility]ell, William Mcgee, MD   2 years ago William Howell, William Mcgee, MD       Future Appointments             In 2 weeks William Males, MD [facility] [place] [facility]             zafirlukast (ACCOLATE) 20 MG tablet [Pharmacy Med Name: Zafirlukast 20 MG Oral Tablet] 180 tablet  3    Sig: TAKE 1 TABLET BY MOUTH TWICE DAILY BEFORE  A  MEAL.  APPOINTMENT  REQUIRED  FOR  FUTURE  REFILLS     Pulmonology:  Leukotriene Inhibitors Failed - 06/13/2022  7:09 AM      Failed - Valid encounter within last 12 months    Recent Outpatient Visits           1 year ago Prediabetes   [place], William Mcgee, MD   1 year ago Viral upper respiratory tract infection   [place] [name], MD   2 years ago Need for prophylactic vaccination and inoculation against influenza   [place] Howell, William Mcgee, MD   2 years ago Prediabetes   [place], William Mcgee, MD   2 years ago William [facility]ell, William Mcgee, MD       Future Appointments             In 2 weeks William Males, MD [facility] at [place] [facility]

## 2022-06-13 NOTE — Telephone Encounter (Signed)
Requested medications are due for refill today.  yes  Requested medications are on the active medications list.  yes  Last refill. 03/07/2021 #90 3 rf  Future visit scheduled.   no  Notes to clinic.  Labs are expired.    Requested Prescriptions  Pending Prescriptions Disp Refills   tamsulosin (FLOMAX) 0.4 MG CAPS capsule [Pharmacy Med Name: Tamsulosin HCl 0.4 MG Oral Capsule] 90 capsule 0    Sig: Take 1 capsule by mouth once daily     Urology: Alpha-Adrenergic Blocker Failed - 06/13/2022  7:09 AM      Failed - PSA in normal range and within 360 days    PSA  Date Value Ref Range Status  05/01/2021 4.35 (H) < OR = 4.00 ng/mL Final    Comment:    The total PSA value from this assay system is  standardized against the WHO standard. The test  result will be approximately 20% lower when compared  to the equimolar-standardized total PSA (Beckman  Coulter). Comparison of serial PSA results should be  interpreted with this fact in mind. . This test was performed using the Siemens  chemiluminescent method. Values obtained from  different assay methods cannot be used interchangeably. PSA levels, regardless of value, should not be interpreted as absolute evidence of the presence or absence of disease.          Failed - Last BP in normal range    BP Readings from Last 1 Encounters:  05/16/22 (!) 142/72         Failed - Valid encounter within last 12 months    Recent Outpatient Visits           1 year ago Prediabetes   [name], [name], MD   1 year ago Viral upper respiratory tract infection   [place] [name], MD   2 years ago Need for prophylactic vaccination and inoculation against influenza   [place] [name], [name], MD   2 years ago Prediabetes   [name], [name], MD   2 years ago [place] [name], [name], MD       Future  Appointments             In 2 weeks Brand Males, MD [facility] at [place]   omeprazole (PRILOSEC) 40 MG capsule 90 capsule 3    Sig: Take 1 capsule by mouth once daily     Gastroenterology: Proton Pump Inhibitors Failed - 06/13/2022  7:09 AM      Failed - Valid encounter within last 12 months    Recent Outpatient Visits           1 year ago Prediabetes   [place] [name], MD   1 year ago Viral upper respiratory tract infection   [place] [name], MD   2 years ago Need for prophylactic vaccination and inoculation against influenza   [place] [name], [name], MD   2 years ago Prediabetes   [name], [name], MD   2 years ago [place] [name], [name], MD       Future Appointments  In 2 weeks Brand Males, MD [facility] at [facility]             zafirlukast (ACCOLATE) 20 MG tablet 180 tablet 3    Sig: TAKE 1 TABLET BY MOUTH TWICE DAILY BEFORE  A  MEAL.  APPOINTMENT  REQUIRED  FOR  FUTURE  REFILLS     Pulmonology:  Leukotriene Inhibitors Failed - 06/13/2022  7:09 AM      Failed - Valid encounter within last 12 months    Recent Outpatient Visits           1 year ago Prediabetes   [name], [name], MD   1 year ago Viral upper respiratory tract infection   [place] [name], MD   2 years ago Need for prophylactic vaccination and inoculation against influenza   [place] [name], [name], MD   2 years ago Prediabetes   [name], [name], MD   2 years ago [place] [name], [name], MD       Future Appointments             In 2 weeks Brand Males, MD [facility] at [facility]

## 2022-06-17 ENCOUNTER — Ambulatory Visit (INDEPENDENT_AMBULATORY_CARE_PROVIDER_SITE_OTHER): Payer: Medicare Other | Admitting: Family Medicine

## 2022-06-17 ENCOUNTER — Encounter: Payer: Self-pay | Admitting: Family Medicine

## 2022-06-17 DIAGNOSIS — E119 Type 2 diabetes mellitus without complications: Secondary | ICD-10-CM

## 2022-06-17 MED ORDER — LOSARTAN POTASSIUM 50 MG PO TABS: 50.0000 mg | ORAL_TABLET | Freq: Every day | ORAL | 3 refills | Status: DC

## 2022-06-17 MED ORDER — OZEMPIC (0.25 OR 0.5 MG/DOSE) 2 MG/3ML ~~LOC~~ SOPN: 0.5000 mg | PEN_INJECTOR | SUBCUTANEOUS | 1 refills | Status: DC

## 2022-06-17 MED ORDER — TAMSULOSIN HCL 0.4 MG PO CAPS: 0.4000 mg | ORAL_CAPSULE | Freq: Every day | ORAL | 3 refills | Status: DC

## 2022-06-17 MED ORDER — ZAFIRLUKAST 20 MG PO TABS: ORAL_TABLET | ORAL | 3 refills | Status: DC

## 2022-06-17 MED ORDER — METFORMIN HCL 500 MG PO TABS: 500.0000 mg | ORAL_TABLET | Freq: Two times a day (BID) | ORAL | 3 refills | Status: DC

## 2022-06-17 NOTE — Progress Notes (Signed)
Subjective:    Patient ID: William Howell, male    DOB: [birth year], 82 y.o.   MRN: [id number] 04/11/22 Patient has had a persistent cough for several months ever since October.  He saw his pulmonologist in December and was given doxycycline and prednisone.  Despite this the cough persisted and he was developing worsening chest congestion, shortness of breath, and wheezing.  Patient had CT scan along 10/21 we did bronchiectasis the right lung with bronchitis and a right lower lobe opacity concerning for pneumonia along with worsening interstitial lung disease.  Patient was placed on prednisone and Levaquin and he states he is doing much better however today on examination he still has prominent right basilar crackles and expiratory wheezing.  He is using his Symbicort.  At that time, my plan was:  I reviewed his CT scan with him.  I explained that the interstitial lung disease has progressed since June.  However he also has bronchiectasis and right lower lobe pneumonia.  I will extend out the Levaquin for an additional week based on his abnormal pulmonary exam today.  I want to upgrade his Symbicort to [facility] 2 puffs inhaled twice daily.  I have asked the patient to start a flutter valve program to improve pulmonary toilet and also add Mucinex.  Recheck in 1 week or sooner if worse  04/22/22 Patient returns today for reevaluation.  He continues to report shortness of breath with activity.  His wife states that he becomes easily winded just walking around outside his home.  On examination he still has rhonchorous breath sounds in both lungs right greater than left.  The wheezing has subsided in both lungs.  His pulse oximetry today is excellent at 99% on room air however his exercise tolerance is poor.  He has not been using the flutter valve yet.  They have tried to find it but they have been unable to locate 1.  He has not been taking Mucinex however I believe that I failed to mention that at his last  visit.  He did just acquire the [facility] but he has not started it yet.  He has completed the Levaquin.  At that time, my plan was: At this point, I do not feel additional antibiotics are necessary.  Patient is afebrile his pulse oximetry is outstanding.  We need to focus on pulmonary toilet and trying to help him cough up the mucus.  I recommended using the flutter valve.  I recommended adding Mucinex to his medication regimen.  Also recommended using Breztri to improve his vital capacity to improve mucus clearance.  At this point I would have to defer to his pulmonary specialist any additional treatments for interstitial lung disease that may exist.  He respectfully requests a transfer to a different pulmonologist due to trouble with communication.   I will contact [place] [facility] and request this on his behalf.  06/17/22 Patient is here today for refills on his metformin.  He has a history of diabetes.  His last A1c was 6.3 that was over a year ago.  Since I saw the patient, he has met with his pulmonologist and they have ordered a high-resolution CT scan of the chest.  I reviewed the findings with him today.  The clinical appearance favors a hypersensitivity pneumonitis similar to farmers lung.  Patient is going his best to avoid irritants.  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  He thinks his breathing may be a little bit better.  His blood pressure today is well-controlled.  He has not started the Ozempic.  We discussed Ozempic as a means to help achieve weight loss try to help with breathing and control blood sugar however he has not yet started the medication Past Medical History:  Diagnosis Date   Allergy    Asthma    Diabetes mellitus type II, controlled ([place])    GERD (gastroesophageal reflux disease)    pt states he takes omeprazole but doesnt have GERD   Hypertension    Obesity    Sleep apnea    uses CPAP   Past  Surgical History:  Procedure Laterality Date   CHOLECYSTECTOMY     DENTAL SURGERY     Current Outpatient Medications on File Prior to Visit  Medication Sig Dispense Refill   aspirin EC 81 MG tablet Take 81 mg by mouth daily.     B Complex Vitamins (B COMPLEX 1 PO) Take by mouth.     Budeson-Glycopyrrol-Formoterol (BREZTRI AEROSPHERE) 160-9-4.8 MCG/ACT AERO Inhale 2 puffs into the lungs 2 (two) times daily. 10.7 g 11   Calcium Carb-Cholecalciferol (CALCIUM 1000 + D PO) Take 1 tablet by mouth 2 (two) times daily.     EQ ALLERGY RELIEF, CETIRIZINE, 10 MG tablet Take 1 tablet by mouth once daily 90 tablet 0   glucosamine-chondroitin 500-400 MG tablet Take 1 tablet 2 (two) times daily by mouth.     Multiple Vitamins-Minerals (MULTIVITAMIN WITH MINERALS) tablet Take 1 tablet daily by mouth.     Omega-3 Fatty Acids (FISH OIL) 1000 MG CAPS Take 1 capsule by mouth daily.     omeprazole (PRILOSEC) 40 MG capsule Take 1 capsule by mouth once daily 90 capsule 3   VENTOLIN HFA 108 (90 Base) MCG/ACT inhaler INHALE 1 TO 2 PUFFS BY MOUTH EVERY 6 HOURS AS NEEDED 18 g 0   SYMBICORT 160-4.5 MCG/ACT inhaler Inhale 2 puffs by mouth twice daily (Patient not taking: Reported on 06/17/2022) 11 g 3   No current facility-administered medications on file prior to visit.   No Known Allergies Social History   Socioeconomic History   Marital status: Married    Spouse name: Malachy Mood   Number of children: 5   Years of education: Not on file   Highest education level: Not on file  Occupational History   Not on file  Tobacco Use   Smoking status: Never   Smokeless tobacco: Former    Types: Nurse, children's Use: Never used  Substance and Sexual Activity   Alcohol use: Yes    Comment: occasional    Drug use: No   Sexual activity: Not Currently  Other Topics Concern   Not on file  Social History Narrative   3 children, 2 stepchildren   12 grandchildren   7 great grandchildren.   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    Social Determinants of Health   Financial Resource Strain: Low Risk  (10/05/2021)   Overall Financial Resource Strain (CARDIA)    Difficulty of Paying Living Expenses: Not hard at all  Food Insecurity: No Food Insecurity (10/05/2021)   Hunger Vital Sign    Worried About Running Out of Food in the Last Year: Never true    Ran Out of Food in the Last Year: Never true  Transportation Needs: No Transportation Needs (10/05/2021)   PRAPARE - Transportation    Lack of Transportation (  Medical): No    Lack of Transportation (Non-Medical): No  Physical Activity: Insufficiently Active (10/05/2021)   Exercise Vital Sign    Days of Exercise per Week: 3 days    Minutes of Exercise per Session: 30 min  Stress: No Stress Concern Present (10/05/2021)   [place]    Feeling of Stress : Not at all  Social Connections: Moderately Integrated (10/05/2021)   Social Connection and Isolation Panel [NHANES]    Frequency of Communication with Friends and Family: More than three times a week    Frequency of Social Gatherings with Friends and Family: More than three times a week    Attends Religious Services: 1 to 4 times per year    Active Member of [employer] or Organizations: No    Attends Archivist Meetings: Never    Marital Status: Married  Human resources officer Violence: Not At Risk (10/05/2021)   Humiliation, Afraid, Rape, and Kick questionnaire    Fear of Current or Ex-Partner: No    Emotionally Abused: No    Physically Abused: No    Sexually Abused: No      Review of Systems  Respiratory:  Positive for cough.   All other systems reviewed and are negative.      Objective:   Physical Exam Vitals reviewed.  Constitutional:      General: He is not in acute distress.    Appearance: He is well-developed. He is not diaphoretic.  HENT:     Head: Normocephalic and atraumatic.     Right Ear: Tympanic membrane, ear  canal and external ear normal.     Left Ear: Tympanic membrane, ear canal and external ear normal.     Mouth/Throat:     Pharynx: No oropharyngeal exudate or posterior oropharyngeal erythema.  Eyes:     General: No scleral icterus.       Right eye: No discharge.        Left eye: No discharge.     Conjunctiva/sclera: Conjunctivae normal.     Pupils: Pupils are equal, round, and reactive to light.  Neck:     Thyroid: No thyromegaly.     Vascular: No JVD.     Trachea: No tracheal deviation.  Cardiovascular:     Rate and Rhythm: Normal rate and regular rhythm.     Heart sounds: Normal heart sounds. No murmur heard.    No friction rub. No gallop.  Pulmonary:     Effort: Pulmonary effort is normal. Prolonged expiration present. No accessory muscle usage or respiratory distress.     Breath sounds: Decreased air movement present. No stridor. Rhonchi and rales present. No wheezing.  Chest:     Chest wall: No tenderness.  Musculoskeletal:     Cervical back: Normal range of motion and neck supple.  Lymphadenopathy:     Cervical: No cervical adenopathy.  Skin:    General: Skin is warm.     Coloration: Skin is not pale.     Findings: No erythema or rash.  Neurological:     Mental Status: He is alert and oriented to person, place, and time.     Cranial Nerves: No cranial nerve deficit.     Motor: No abnormal muscle tone.     Coordination: Coordination normal.     Deep Tendon Reflexes: Reflexes are normal and symmetric.  Psychiatric:        Behavior: Behavior normal.        Thought Content: Thought  content normal.        Judgment: Judgment normal.           Assessment & Plan:  Morbid obesity ([place]) - Plan: Semaglutide,0.25 or 0.'5MG'$ /DOS, (OZEMPIC, 0.25 OR 0.5 MG/DOSE,) 2 MG/3ML SOPN  Diabetes mellitus without complication ([place]) - Plan: Semaglutide,0.25 or 0.'5MG'$ /DOS, (OZEMPIC, 0.25 OR 0.5 MG/DOSE,) 2 MG/3ML SOPN, Hemoglobin A1c I think weight loss would help his breathing by  reducing resistance and possible contribution of obesity hypoventilation syndrome.  Therefore I recommended he start Ozempic 0.5 mg subcu weekly and uptitrate monthly as tolerated.  I will check an A1c today.  Goal A1c is less than 6.5.  Continue the metformin at the present time

## 2022-06-18 ENCOUNTER — Other Ambulatory Visit: Payer: Self-pay | Admitting: Family Medicine

## 2022-06-18 LAB — HEMOGLOBIN A1C
Hgb A1c MFr Bld: 6.3 % of total Hgb — ABNORMAL HIGH (ref <5.7)
Mean Plasma Glucose: 134 mg/dL
eAG (mmol/L): 7.4 mmol/L

## 2022-06-19 ENCOUNTER — Telehealth: Payer: Self-pay

## 2022-06-19 NOTE — Telephone Encounter (Signed)
Fax sent to [facility] for Zafirlukast 20 mg. Fax [phone].

## 2022-06-21 ENCOUNTER — Ambulatory Visit
Admission: RE | Admit: 2022-06-21 | Discharge: 2022-06-21 | Disposition: A | Discharge status: Home / Self Care | Payer: Medicare Other | Source: Ambulatory Visit | Attending: Internal Medicine | Admitting: Internal Medicine

## 2022-06-21 DIAGNOSIS — I080 Rheumatic disorders of both mitral and aortic valves: Secondary | ICD-10-CM | POA: Insufficient documentation

## 2022-06-21 DIAGNOSIS — I10 Essential (primary) hypertension: Secondary | ICD-10-CM | POA: Diagnosis not present

## 2022-06-21 DIAGNOSIS — R0609 Other forms of dyspnea: Secondary | ICD-10-CM | POA: Insufficient documentation

## 2022-06-21 DIAGNOSIS — E119 Type 2 diabetes mellitus without complications: Secondary | ICD-10-CM | POA: Diagnosis not present

## 2022-06-21 LAB — ECHOCARDIOGRAM COMPLETE
AR max vel: 2.77 cm2
AV Area VTI: 2.88 cm2
AV Area mean vel: 2.67 cm2
AV Mean grad: 4 mmHg
AV Peak grad: 7.2 mmHg
Ao pk vel: 1.34 m/s
Area-P 1/2: 2.83 cm2
S' Lateral: 3 cm

## 2022-06-21 NOTE — Progress Notes (Signed)
*  PRELIMINARY RESULTS* Echocardiogram 2D Echocardiogram has been performed.  [name] 06/21/2022, 9:33 AM

## 2022-06-23 NOTE — Progress Notes (Signed)
Echo essentially normal but for mild heart muscle stiffness tha comes with age/weight. Pls let William Howell know

## 2022-06-28 ENCOUNTER — Ambulatory Visit (INDEPENDENT_AMBULATORY_CARE_PROVIDER_SITE_OTHER): Payer: Medicare Other | Admitting: Internal Medicine

## 2022-06-28 ENCOUNTER — Encounter: Payer: Self-pay | Admitting: Internal Medicine

## 2022-06-28 ENCOUNTER — Ambulatory Visit: Payer: Medicare Other | Admitting: Internal Medicine

## 2022-06-28 VITALS — BP 122/66 | HR 86 | Temp 97.6°F | Ht 67.5 in | Wt 269.8 lb

## 2022-06-28 DIAGNOSIS — J849 Interstitial pulmonary disease, unspecified: Secondary | ICD-10-CM

## 2022-06-28 DIAGNOSIS — R053 Chronic cough: Secondary | ICD-10-CM

## 2022-06-28 DIAGNOSIS — R0609 Other forms of dyspnea: Secondary | ICD-10-CM

## 2022-06-28 DIAGNOSIS — J40 Bronchitis, not specified as acute or chronic: Secondary | ICD-10-CM

## 2022-06-28 LAB — PULMONARY FUNCTION TEST
DL/VA % pred: 131 %
DL/VA: 5.1 ml/min/mmHg/L
DLCO cor % pred: 86 %
DLCO cor: 20.45 ml/min/mmHg
DLCO unc % pred: 83 %
DLCO unc: 19.66 ml/min/mmHg
FEF 25-75 Post: 2.35 L/sec
FEF 25-75 Pre: 1.57 L/sec
FEF2575-%Change-Post: 50 %
FEF2575-%Pred-Post: 128 %
FEF2575-%Pred-Pre: 85 %
FEV1-%Change-Post: 13 %
FEV1-%Pred-Post: 68 %
FEV1-%Pred-Pre: 60 %
FEV1-Post: 1.86 L
FEV1-Pre: 1.64 L
FEV1FVC-%Change-Post: 10 %
FEV1FVC-%Pred-Pre: 107 %
FEV6-%Change-Post: 6 %
FEV6-%Pred-Post: 61 %
FEV6-%Pred-Pre: 58 %
FEV6-Post: 2.2 L
FEV6-Pre: 2.06 L
FEV6FVC-%Change-Post: 0 %
FEV6FVC-%Pred-Post: 107 %
FEV6FVC-%Pred-Pre: 107 %
FVC-%Change-Post: 2 %
FVC-%Pred-Post: 57 %
FVC-%Pred-Pre: 55 %
FVC-Post: 2.2 L
FVC-Pre: 2.14 L
Post FEV1/FVC ratio: 85 %
Post FEV6/FVC ratio: 100 %
Pre FEV1/FVC ratio: 77 %
Pre FEV6/FVC Ratio: 100 %
RV % pred: 70 %
RV: 1.86 L
TLC % pred: 63 %
TLC: 4.38 L

## 2022-06-28 MED ORDER — DOXYCYCLINE HYCLATE 100 MG PO TABS: 100.0000 mg | ORAL_TABLET | Freq: Two times a day (BID) | ORAL | 0 refills | Status: DC

## 2022-06-28 MED ORDER — PREDNISONE 10 MG PO TABS: ORAL_TABLET | ORAL | 0 refills | Status: AC

## 2022-06-28 NOTE — Progress Notes (Signed)
Full PFT performed today. °

## 2022-06-28 NOTE — Patient Instructions (Addendum)
ICD-10-CM   1. ILD (interstitial lung disease) ([place])  J84.9     2. DOE (dyspnea on exertion)  R06.09     3. Chronic cough  R05.3     4. Bronchitis  J40      You have interstitial lung disease.  Current burden appears to be mild although there is likely more fibrosis than in the past.  He also seems to having some subacute bronchitis after COVID-19 in mid February 2024  Glad you are losing weight after dental extraction and recently started Ozempic which is causing some diarrhea after the first dose  Plan - do not recommend biopsy at this point (risk > benefit)  - I think would benefit from antifibrotic treatment  -I will confirm this after discussing with Dr. Silas Flood and also discussing about progression with radiologist  -I will probably recommend pirfenidone [Esbriet] which can cause some nausea all but also diminished appetite and weight loss  -I will keep nintedanib as second line because of diarrhea with Ozempic  -I will call you within the next week with an update after discussing with thoracic radiology  +/-  discuss in case conference (basically to quantify progression on CT scan 2017 -> 2024)  -Stop fish oil and also Breztril  -For acute bronchitis  - Take doxycycline 100mg  po twice daily x 5 days; take after meals and avoid sunlight  - Please take prednisone 40 mg x1 day, then 30 mg x1 day, then 20 mg x1 day, then 10 mg x1 day, and then 5 mg x1 day and stop  Follow-up - Return in 8 weeks to see Dr. Chase Caller nurse practitioner  -Symptoms score and Walking desaturation test at follow-up

## 2022-06-28 NOTE — Progress Notes (Signed)
OV 05/16/2022 -transfer of care to the ILD center with Dr. Chase Caller.    Subjective:  Patient ID: William Howell, male , DOB: [birth year] , age 82 y.o. , MRN: [id number] , ADDRESS: [place] [id number] PCP [name], MD Patient Care Team: [name], MD as PCP - General (Family Medicine) Debara Pickett Nadean Corwin, MD as PCP - Cardiology (Cardiology) Debara Pickett Nadean Corwin, MD as Consulting Physician (Cardiology)  This Provider for this visit: Treatment Team:  Attending Provider: Brand Males, MD    05/16/2022 -   Chief Complaint  Patient presents with   Consult    Cough,      HPI William Howell 82 y.o. -is maternal grandfather to Dr. Larey Days pulmonologist at [facility] health.  He is transferring his care to Dr. Chase Caller [me] because of concern of interstitial lung disease.  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  He does a lot of heavy manual work.  He has significant exposures that because of his farm work.  He has also obesity and has sleep apnea.  He uses CPAP at night previously followed by Dr. Jenetta Downer (currently unclear who his new sleep doctor will be].  He reports dyspnea on exertion for at least the last 10-12 years but for the last 1 year it is progressive.  It is relieved by rest.  However since October 2023 has had a cough it initially got better but then November 2023 it came back again.  He recollects being treated for pneumonia.  Around this time in December 2023 a few days before Christmas he did have a CT scan of the chest that I personally visualized it does show right lower lobe density consistent with pneumonia].  Since then after treatment of pneumonia his cough is better but is still not back at baseline.  His shortness of breath is also not back at baseline.  Regarding his cough overall it was worse compared to a year ago although better compared to fall 2023.  He does bring out some  occasional clear to light yellow phlegm.  He does cough at night.  He does also have wheezing with the cough he does clear the throat but there is no hemoptysis.  The cough does not get worse when he lies down.  There is no tickle in his throat.  [facility] Comprehensive ILD Questionnaire  Symptoms:   Past Medical History :  -Obesity -Sleep apnea on CPAP -Review of the external records indicate that in 2019 he was being treated for asthma by Dr. Simonne Maffucci. -Denies any connective tissue disease -Has hypertension Pneumonia right lower lobe December 2023 -He has had both COVID-vaccine and COVID disease but was never hospitalized for COVID.   ROS:  --Fatigue Shortness of breath Chronic cough Some weight loss Does not snore because of CPAP   FAMILY HISTORY of LUNG DISEASE:  -No family history of pulmonary fibrosis but his father did have COPD  PERSONAL EXPOSURE HISTORY:  -Non-smoker.  No marijuana use.  No cocaine use no intravenous drug use  HOME  EXPOSURE and HOBBY DETAILS :  -Lives and is fine to take a farm house single[place].  The home is 82 years old.  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  Since 1970s has been active farming.  He has been exposed to working in [employer].  Is been a daily farmer a lot of chemical exposure.  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  He is also done sewage work.  None animal feed production.  Been a consideration is.  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  Has done foundry work, Freight forwarder, Paramedic, Glass blower/designer, woodwork presents on 5  working.  Has been exposed to pesticides.  PULMONARY TOXICITY HISTORY (27 items):  -He has been on and off prednisone for years but otherwise detailed pulmonary drug toxicity is negative  INVESTIGATIONS: -Last pulmonary function test in 2017 with reduced FVC but normal DLCO.  This is consistent with obesity. -CT chest without contrast December 2023:  -He does have right lower lobe pneumonia  -He does have ILD that I suspect is in a pattern not consistent with UIP/alternative to UIP pattern  -Onset: In 2017 CT scan of the chest he did not seem to have it v GGO/Air trapping  However 2022 he has some ILD changes. -   CT Chest data - CTWO contrast 04/04/22  Narrative & Impression  CLINICAL DATA:  Follow-up lung nodule seen on radiographs   EXAM: CT CHEST WITHOUT CONTRAST   TECHNIQUE: Multidetector CT imaging of the chest was performed following the standard protocol without IV contrast.   RADIATION DOSE REDUCTION: This exam was performed according to the departmental dose-optimization program which includes automated exposure control, adjustment of the mA and/or kV according to patient size and/or use of iterative reconstruction technique.   COMPARISON:  Radiographs 03/19/2022 and CTA chest 09/28/2021   FINDINGS: Cardiovascular: Normal heart size. No pericardial effusion. Aortic atherosclerotic calcification. The ascending aorta measures approximately 44 mm in diameter (coronal image 86) this previously measured 41 mm on CTA 09/28/2021 though direct comparison is limited by lack of IV contrast.   Mediastinum/Nodes: Increased size of multiple mediastinal lymph nodes. For example a right paratracheal node measures 1.0 cm (3/33), previously 0.6 cm. Unremarkable esophagus.   Lungs/Pleura: Patchy reticulonodular opacities and interstitial thickening are slightly progressed from 09/28/2021. Increased bronchial wall thickening and bronchiectasis/bronchiolectasis greatest in the  right lower lobe. Focal atelectasis/consolidation in the posterior right lower lobe. No pleural effusion or pneumothorax. No CT correlate for nodule seen on radiographs 03/19/2022.   Upper Abdomen: Cholecystectomy. Colonic diverticulosis without diverticulitis. No acute abnormality   Musculoskeletal: No chest wall mass or suspicious bone lesions identified.   IMPRESSION: 1. Pulmonary findings compatible with bronchitis/bronchiolitis with right lower lobe pneumonia. 2. Similar to slight progression of interstitial lung disease compared with 09/28/2021. 3. Ascending aortic aneurysm measures 44 mm in diameter on today's exam. This may be slightly increased from 09/28/2021 however comparison is difficult given differences in obliquity and lack of IV contrast. Continued annual imaging follow-up with CTA or MRA is recommended.     Electronically Signed   By: Placido Sou M.D.   On: 04/08/2022 00:02     OV 06/28/2022  Subjective:  Patient ID: William Howell, male , DOB: 09/15/1940 , age 6 y.o. , MRN: [id number] , ADDRESS: [address] [place] [address] PCP [name], MD Patient Care Team: [name], MD as PCP - General (Family Medicine) Debara Pickett Nadean Corwin, MD as PCP -  Cardiology (Cardiology) Debara Pickett Nadean Corwin, MD as Consulting Physician (Cardiology)  This Provider for this visit: Treatment Team:  Attending Provider: Brand Males, MD  ILD workup in progress  06/28/2022 -   Chief Complaint  Patient presents with   Follow-up    COVID 05/28/2022.  Cough with yellow mucus.  SOB with exertion.   ILD workup in pgoress.  HPI William Howell 82 y.o. -here with wife to review CT, PFT and serology. Says In interim had dental extraction and after that has had dimnished PO and lost 27#. Now a week ago started Ozempoc. HE is hoping to lose more weight but is already having some diarrhea./ Also mid feb 2024 had a bout of covid that was mild though is  having some small amout of cough with yellow sputum .  His PFTs show mild decline in FVC and DLCO in 7 year. . Visualization of images - I Agree is features are NOT cw UIP, Agree is more c/w HP or NSIP. Agree is mild. However, I am not sure of radiologic progression.   We decissused that biopsy is indicated but I did recommend that risk for SLB is > benefit. Did indicate at some point we can consider bronch biopy but at this point was to sort out if there is progerssion on CT . PFTs ae suggesting progression. On radiology repot (Dec 2023 suggested progression) but the current 2024 reporrt is equivocal. If there is progression, would recommend anti-fibrotic but did indicate to him tht markers of ILD for him are indicative of mild burden and likely only very slow progression. I will dw Dr Burt Ek radiologist for anotehr opinion and also in our MDD conference  We discussed esbriet v ofev and advised him that I preferred esbriet for him because he is alrady having some diarrhea with ozempic and due to cardiac black boox warnings with ofev   Will also d/w his grandson Dr Silas Flood  SYMPTOM SCALE - ILD 05/16/2022 06/28/2022   Current weight    O2 use ra ra  Shortness of Breath 0 -> 5 scale with 5 being worst (score 6 If unable to do)   At rest 1 1  Simple tasks - showers, clothes change, eating, shaving 2 2  Household (dishes, doing bed, laundry) 4 3  Shopping 3 3  Walking level at own pace 4 3  Walking up Stairs 4 3  Total (30-36) Dyspnea Score 11 15      Non-dyspnea symptoms (0-> 5 scale) 05/16/2022 06/28/2022   How bad is your cough? 3 2  How bad is your fatigue 4 3  How bad is nausea 0 0  How bad is vomiting?  0 0  How bad is diarrhea? 0 0 - mild after ozempic start a wekk aog  How bad is anxiety? 1 0  How bad is depression 1 0  Any chronic pain - if so where and how bad 1 0      PFT     Latest Ref Rng & Units 06/28/2022    9:00 AM 12/12/2015   10:53 AM  PFT Results  FVC-Pre L  2.14  P 2.86   FVC-Predicted Pre % 55  P 70   FVC-Post L 2.20  P   FVC-Predicted Post % 57  P   Pre FEV1/FVC % % 77  P 80   Post FEV1/FCV % % 85  P   FEV1-Pre L 1.64  P 2.28   FEV1-Predicted Pre % 60  P 77  FEV1-Post L 1.86  P   DLCO uncorrected ml/min/mmHg 19.66  P 26.92   DLCO UNC% % 83  P 86   DLCO corrected ml/min/mmHg 20.45  P   DLCO COR %Predicted % 86  P   DLVA Predicted % 131  P 122   TLC L 4.38  P 5.07   TLC % Predicted % 63  P 74   RV % Predicted % 70  P 73     P Preliminary result    Latest Reference Range & Units 05/16/22 10:29  Anti Nuclear Antibody (ANA) NEGATIVE  POSITIVE !  ANA Pattern 1  Nuclear, Speckled !  ANA Titer 1 titer [id number] (H)  Cyclic Citrullin Peptide Ab UNITS <16  RA Latex Turbid. <14 IU/mL <14  SSA (Ro) (ENA) Antibody, IgG <1.0 NEG AI <1.0 NEG  Scleroderma (Scl-70) (ENA) Antibody, IgG <1.0 NEG AI <1.0 NEG  !: Data is abnormal (H): Data is abnormally high    HRCT 06/12/22  arrative & Impression  CLINICAL DATA:  Interstitial lung disease   EXAM: CT CHEST WITHOUT CONTRAST   TECHNIQUE: Multidetector CT imaging of the chest was performed following the standard protocol without intravenous contrast. High resolution imaging of the lungs, as well as inspiratory and expiratory imaging, was performed.   RADIATION DOSE REDUCTION: This exam was performed according to the departmental dose-optimization program which includes automated exposure control, adjustment of the mA and/or kV according to patient size and/or use of iterative reconstruction technique.   COMPARISON:  04/04/2022, 01/15/2016   FINDINGS: Cardiovascular: Aortic atherosclerosis. Normal heart size. No pericardial effusion.   Mediastinum/Nodes: No enlarged mediastinal, hilar, or axillary lymph nodes. Thyroid gland, trachea, and esophagus demonstrate no significant findings.   Lungs/Pleura: Mild pulmonary fibrosis in a pattern without clear apical to basal gradient, featuring  irregular peripheral interstitial opacity, some septal thickening, associated ground-glass opacity, mild tubular bronchiectasis and some suggestion of architectural distortion. Multiple scattered areas of fine centrilobular and tree-in-bud nodularity, heterogeneous airspace opacity, and small consolidations are fluctuant in comparison to prior examination. Mild diffuse bilateral bronchial wall thickening. No significant air trapping on expiratory phase imaging. No pleural effusion or pneumothorax.   Upper Abdomen: No acute abnormality.   Musculoskeletal: No chest wall abnormality. No acute osseous findings.   IMPRESSION: 1. Mild pulmonary fibrosis in a pattern without clear apical to basal gradient, featuring irregular peripheral interstitial opacity, some septal thickening, associated ground-glass opacity, mild tubular bronchiectasis and some suggestion of architectural distortion. Findings are suggestive of an alternative diagnosis (not UIP) per consensus guidelines, favor chronic hypersensitivity pneumonitis or chronic fibrotic phase NSIP given this appearance: Diagnosis of Idiopathic Pulmonary Fibrosis: An Official ATS/ERS/JRS/ALAT Clinical Practice Guideline. [place], Iss 5, [phone], Dec 14 2016. 2. Multiple scattered areas of fine centrilobular and tree-in-bud nodularity, heterogeneous airspace opacity, and small consolidations are fluctuant in comparison to prior examination consistent with ongoing atypical infection such as atypical mycobacterium. 3. Mild diffuse bilateral bronchial wall thickening.   Aortic Atherosclerosis (ICD10-I70.0).     Electronically Signed   By: Delanna Ahmadi M.D.   On: 06/13/2022 20:27    ECHO     1. Left ventricular ejection fraction, by estimation, is 55 to 60%. The  left ventricle has normal function. The left ventricle has no regional  wall motion abnormalities. Left ventricular diastolic parameters are   consistent with Grade I diastolic  dysfunction (impaired relaxation).   2. Right ventricular systolic function is normal. The right ventricular  size is normal.   3. The mitral valve is normal in structure. Mild mitral valve  regurgitation. No evidence of mitral stenosis.   4. The aortic valve has an indeterminant number of cusps. Aortic valve  regurgitation is mild. Aortic valve sclerosis/calcification is present,  without any evidence of aortic stenosis.   5. There is borderline dilatation of the aortic root, measuring 40 mm.   6. The inferior vena cava is normal in size with greater than 50%  respiratory variability, suggesting right atrial pressure of 3 mmHg.    has a past medical history of Allergy, Asthma, Diabetes mellitus type II, controlled ([place]), GERD (gastroesophageal reflux disease), Hypertension, Obesity, and Sleep apnea.   reports that he has never smoked. He has quit using smokeless tobacco.  His smokeless tobacco use included chew.  Past Surgical History:  Procedure Laterality Date   CHOLECYSTECTOMY     DENTAL SURGERY      No Known Allergies  Immunization History  Administered Date(s) Administered   Fluad Quad(high Dose 65+) 01/04/2019, 03/27/2020, 04/22/2022   Influenza, High Dose Seasonal PF 02/17/2017   Influenza,inj,Quad PF,6+ Mos 02/10/2014, 03/14/2015, 01/09/2016, 02/12/2018   Influenza-Unspecified 02/12/2018   PFIZER(Purple Top)SARS-COV-2 Vaccination 07/10/2019, 08/04/2019   Pneumococcal Conjugate-13 07/04/2014   Pneumococcal Polysaccharide-23 12/26/2011   Td 12/26/2011   Tdap 12/26/2011   Zoster Recombinat (Shingrix) 02/20/2021, 04/18/2021   Zoster, Live 02/10/2014    Family History  Problem Relation Age of Onset   Heart disease Father    Heart attack Father    Heart attack Sister    Colon cancer Neg Hx    Colon polyps Neg Hx    Esophageal cancer Neg Hx    Rectal cancer Neg Hx    Stomach cancer Neg Hx    Pancreatic cancer Neg Hx    Liver  cancer Neg Hx    Prostate cancer Neg Hx      Current Outpatient Medications:    aspirin EC 81 MG tablet, Take 81 mg by mouth daily., Disp: , Rfl:    B Complex Vitamins (B COMPLEX 1 PO), Take by mouth., Disp: , Rfl:    Budeson-Glycopyrrol-Formoterol (BREZTRI AEROSPHERE) 160-9-4.8 MCG/ACT AERO, Inhale 2 puffs into the lungs 2 (two) times daily., Disp: 10.7 g, Rfl: 11   Calcium Carb-Cholecalciferol (CALCIUM 1000 + D PO), Take 1 tablet by mouth 2 (two) times daily., Disp: , Rfl:    EQ ALLERGY RELIEF, CETIRIZINE, 10 MG tablet, Take 1 tablet by mouth once daily, Disp: 90 tablet, Rfl: 0   losartan (COZAAR) 50 MG tablet, Take 1 tablet (50 mg total) by mouth daily., Disp: 90 tablet, Rfl: 3   metFORMIN (GLUCOPHAGE) 500 MG tablet, Take 1 tablet (500 mg total) by mouth 2 (two) times daily with a meal., Disp: 180 tablet, Rfl: 3   Multiple Vitamins-Minerals (MULTIVITAMIN WITH MINERALS) tablet, Take 1 tablet daily by mouth., Disp: , Rfl:    Omega-3 Fatty Acids (FISH OIL) 1000 MG CAPS, Take 1 capsule by mouth daily., Disp: , Rfl:    omeprazole (PRILOSEC) 40 MG capsule, Take 1 capsule by mouth once daily, Disp: 90 capsule, Rfl: 3   Semaglutide,0.25 or 0.5MG /DOS, (OZEMPIC, 0.25 OR 0.5 MG/DOSE,) 2 MG/3ML SOPN, Inject 0.5 mg into the skin once a week., Disp: 3 mL, Rfl: 1   tamsulosin (FLOMAX) 0.4 MG CAPS capsule, Take 1 capsule (0.4 mg total) by mouth daily., Disp: 90 capsule, Rfl: 3   VENTOLIN HFA 108 (90 Base) MCG/ACT inhaler, INHALE 1 TO 2 PUFFS BY  MOUTH EVERY 6 HOURS AS NEEDED, Disp: 18 g, Rfl: 0   zafirlukast (ACCOLATE) 20 MG tablet, TAKE 1 TABLET BY MOUTH TWICE DAILY BEFORE  A  MEAL.  APPOINTMENT  REQUIRED  FOR  FUTURE  REFILLS, Disp: 180 tablet, Rfl: 3   glucosamine-chondroitin 500-400 MG tablet, Take 1 tablet 2 (two) times daily by mouth. (Patient not taking: Reported on 06/28/2022), Disp: , Rfl:    SYMBICORT 160-4.5 MCG/ACT inhaler, Inhale 2 puffs by mouth twice daily (Patient not taking: Reported on  06/17/2022), Disp: 11 g, Rfl: 3      Objective:   Vitals:   06/28/22 1019  BP: 122/66  Pulse: 86  Temp: 97.6 F (36.4 C)  TempSrc: Oral  SpO2: 100%  Weight: 269 lb 12.8 oz (122.4 kg)  Height: 5' 7.5" (1.715 m)    Estimated body mass index is 41.63 kg/m as calculated from the following:   Height as of this encounter: 5' 7.5" (1.715 m).   Weight as of this encounter: 269 lb 12.8 oz (122.4 kg).  @WEIGHTCHANGE @  [employer]   06/28/22 1019  Weight: 269 lb 12.8 oz (122.4 kg)     Physical Exam General: No distress. Obese  BUT LOST WEIGH Neuro: Alert and Oriented x 3. GCS 15. Speech normal Psych: Pleasant Resp:  Barrel Chest - no.  Wheeze - no, Crackles - no, No overt respiratory distress CVS: Normal heart sounds. Murmurs - no Ext: Stigmata of Connective Tissue Disease - no HEENT: Normal upper airway. PEERL +. No post nasal drip        Assessment:       ICD-10-CM   1. ILD (interstitial lung disease) ([place])  J84.9     2. DOE (dyspnea on exertion)  R06.09     3. Chronic cough  R05.3     4. Bronchitis  J40          Plan:     Patient Instructions     ICD-10-CM   1. ILD (interstitial lung disease) ([place])  J84.9     2. DOE (dyspnea on exertion)  R06.09     3. Chronic cough  R05.3     4. Bronchitis  J40      You have interstitial lung disease.  Current burden appears to be mild although there is more fibrosis than in the past.  He also seems to having some subacute bronchitis after COVID-19 in mid February 2024  Glad you are losing weight after dental extraction and recently started Ozempic which is causing some diarrhea after the first dose  Plan - do not recommend biopsy at this point (risk > benefit)  - I think would benefit from antifibrotic treatment  -I will confirm this after discussing with Dr. Silas Flood and also discussing about progression with radiologist  -I will probably recommend pirfenidone [Esbriet] which can cause some nausea all but  also diminished appetite and weight loss  -I will keep nintedanib as second line because of diarrhea with Ozempic  -I will call you within the next week with an update  -Stop fish oil and also Breztril  -For acute bronchitis  - Take doxycycline 100mg  po twice daily x 5 days; take after meals and avoid sunlight  - Please take prednisone 40 mg x1 day, then 30 mg x1 day, then 20 mg x1 day, then 10 mg x1 day, and then 5 mg x1 day and stop  Follow-up - Return in 8 weeks to see Dr. Chase Caller nurse practitioner  -Symptoms score  and Walking desaturation test at follow-up   ( Level 05 visit: Estb 40-54 min   in  visit type: on-site physical face to visit  in total care time and counseling or/and coordination of care by this undersigned MD - Dr Brand Males. This includes one or more of the following on this same day 06/28/2022: pre-charting, chart review, note writing, documentation discussion of test results, diagnostic or treatment recommendations, prognosis, risks and benefits of management options, instructions, education, compliance or risk-factor reduction. It excludes time spent by the [place] or office staff in the care of the patient. Actual time 66 min)   SIGNATURE    Dr. Brand Males, M.D., F.C.C.P,  Pulmonary and Critical Care Medicine Staff Physician, [place] Director - Interstitial Lung Disease  Program  P[facility] [place] [facility] [place], [place], [address]  Pager: [phone], If no answer or between  15:00h - 7:00h: call [phone] Telephone: [phone]  10:46 AM 06/28/2022

## 2022-06-28 NOTE — Patient Instructions (Signed)
Full PFT performed today. °

## 2022-07-01 DIAGNOSIS — H52203 Unspecified astigmatism, bilateral: Secondary | ICD-10-CM | POA: Diagnosis not present

## 2022-07-01 LAB — HM DIABETES EYE EXAM

## 2022-07-03 DIAGNOSIS — G4737 Central sleep apnea in conditions classified elsewhere: Secondary | ICD-10-CM | POA: Diagnosis not present

## 2022-07-03 DIAGNOSIS — G4731 Primary central sleep apnea: Secondary | ICD-10-CM | POA: Diagnosis not present

## 2022-07-03 DIAGNOSIS — G4730 Sleep apnea, unspecified: Secondary | ICD-10-CM | POA: Diagnosis not present

## 2022-07-08 ENCOUNTER — Encounter: Payer: Self-pay | Admitting: Family Medicine

## 2022-07-23 ENCOUNTER — Ambulatory Visit: Payer: Self-pay | Admitting: Internal Medicine

## 2022-07-26 NOTE — Progress Notes (Signed)
Interstitial Lung Disease Multidisciplinary Conference   William Howell    MRN [id number]    DOB 1940-06-14  Primary Care Physician:[name], Priscille Heidelberg, MD  Referring Physician: Dr. Marchelle Gearing  Time of Conference: 7.00am- 8.00am Date of conference: 07/23/2022 Location of Conference: -  Virtual  Participating Pulmonary: Dr. Kalman Shan, Dr Chilton Greathouse, Dr. 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 First HRCT 2017, Some other CTs in 2022 and 2023. He has dyspnea on exertion x 12  years but progressive x 1 year.  End of 2023 got Rx for PNA>  but this could be all obesity. His PFTs 2017 -> 2023 show decline 2.86L/70% -> 2.14L/55%. DLCO: has gone from 26.9 -> 19.9 in same period but both listed as normal > 80%. Need to figure out if he should go an anti-fibrotic so need to see CT and see if he has progressive phenotype on ILD because symptoms could be from obesity  and also because overall burden of ILD is mild.   PFT    Latest Ref Rng & Units 06/28/2022    9:00 AM 12/12/2015   10:53 AM  PFT Results  FVC-Pre L 2.14  2.86   FVC-Predicted Pre % 55  70   FVC-Post L 2.20    FVC-Predicted Post % 57    Pre FEV1/FVC % % 77  80   Post FEV1/FCV % % 85    FEV1-Pre L 1.64  2.28   FEV1-Predicted Pre % 60  77   FEV1-Post L 1.86    DLCO uncorrected ml/min/mmHg 19.66  26.92   DLCO UNC% % 83  86   DLCO corrected ml/min/mmHg 20.45    DLCO COR %Predicted % 86    DLVA Predicted % 131  122   TLC L 4.38  5.07   TLC % Predicted % 63  74   RV % Predicted % 70  73       MDD discussion of CT scan    - Date or time period of scan:  HRCT: 06/12/2022 HRCT: 01/15/2016  - Discussion synopsis:  Feb 2024: ILD + , Upper lobe pednomnance, Primary feature is ground glass, There is volume loss and archietctural distorion in Upper Lobe.   - What is  the final conclusion per 2018 ATS/Fleischner Criteria - Alternative Diagnosis. Dx #1 is Chronic HP.  and worse since 2017 slowly. Also, in current FEb 2024 there is some acue process with nodularity - cocerconsuggestive of acute atypical process such as MAC   - Concordance with official report: DISCORDANT  Pathology discussion of biopsy: No lung biopsy   MDD Impression/Recs: Rx as HP - steroid can be problematic. Rx as progressive phentopye with anti-fibrotic. Also do bronc with BAL but no need for TBbx   Time Spent in preparation and discussion:  > 30 min    SIGNATURE   Dr. Kalman Shan, M.D., F.C.C.P,  Pulmonary and Critical Care Medicine Staff Physician, [facility] Director - Interstitial Lung Disease  Program  Pulmonary Fibrosis [facility] [place], [place], [address]  Pager: [phone], If no answer or between  15:00h - 7:00h: call [phone] Telephone: [phone]  2:03 PM 07/26/2022 ...................................................................................................................Marland Kitchen References: Diagnosis of Hypersensitivity Pneumonitis in Adults. An Official ATS/JRS/ALAT Clinical Practice Guideline. Ragu G et al, Am J Respir Crit Care  Med. 2020 Aug 1;202(3):e36-e69.       Diagnosis of Idiopathic Pulmonary Fibrosis. An Official ATS/ERS/JRS/ALAT Clinical Practice Guideline. Raghu G et al, Am J Respir Crit Care Med. 2018 Sep 1;198(5):e44-e68.   IPF Suspected   Histopath ology Pattern      UIP  Probable UIP  Indeterminate for  UIP  Alternative  diagnosis    UIP  IPF  IPF  IPF  Non-IPF dx   HRCT   Probabe UIP  IPF  IPF  IPF (Likely)**  Non-IPF dx  Pattern  Indeterminate for UIP  IPF  IPF (Likely)**  Indeterminate  for IPF**  Non-IPF dx    Alternative diagnosis  IPF (Likely)**/ non-IPF dx  Non-IPF dx  Non-IPF dx  Non-IPF dx     Idiopathic pulmonary fibrosis  diagnosis based upon HRCT and Biopsy paterns.  ** IPF is the likely diagnosis when any of following features are present:  Moderate-to-severe traction bronchiectasis/bronchiolectasis (defined as mild traction bronchiectasis/bronchiolectasis in four or more lobes including the lingual as a lobe, or moderate to severe traction bronchiectasis in two or more lobes) in a man over age 42 years or in a woman over age 85 years Extensive (>30%) reticulation on HRCT and an age >70 years  Increased neutrophils and/or absence of lymphocytosis in BAL fluid  Multidisciplinary discussion reaches a confident diagnosis of IPF.   **Indeterminate for IPF  Without an adequate biopsy is unlikely to be IPF  With an adequate biopsy may be reclassified to a more specific diagnosis after multidisciplinary discussion and/or additional consultation.   dx = diagnosis; HRCT = high-resolution computed tomography; IPF = idiopathic pulmonary fibrosis; UIP = usual interstitial pneumonia.

## 2022-08-03 DIAGNOSIS — G4737 Central sleep apnea in conditions classified elsewhere: Secondary | ICD-10-CM | POA: Diagnosis not present

## 2022-08-03 DIAGNOSIS — G4731 Primary central sleep apnea: Secondary | ICD-10-CM | POA: Diagnosis not present

## 2022-08-03 DIAGNOSIS — G4730 Sleep apnea, unspecified: Secondary | ICD-10-CM | POA: Diagnosis not present

## 2022-08-08 ENCOUNTER — Other Ambulatory Visit: Payer: Self-pay | Admitting: Family Medicine

## 2022-08-15 ENCOUNTER — Ambulatory Visit (INDEPENDENT_AMBULATORY_CARE_PROVIDER_SITE_OTHER): Payer: Medicare Other | Admitting: Family Medicine

## 2022-08-15 VITALS — BP 124/76 | HR 81 | Temp 97.5°F | Ht 70.0 in | Wt 268.8 lb

## 2022-08-15 DIAGNOSIS — Z23 Encounter for immunization: Secondary | ICD-10-CM | POA: Diagnosis not present

## 2022-08-15 DIAGNOSIS — L03116 Cellulitis of left lower limb: Secondary | ICD-10-CM

## 2022-08-15 MED ORDER — CEPHALEXIN 500 MG PO CAPS: 500.0000 mg | ORAL_CAPSULE | Freq: Three times a day (TID) | ORAL | 0 refills | Status: DC

## 2022-08-15 NOTE — Addendum Note (Signed)
Addended by: Venia Carbon K on: 08/15/2022 11:52 AM   Modules accepted: Orders

## 2022-08-15 NOTE — Progress Notes (Signed)
Subjective:    Patient ID: William Howell, male    DOB: 1940/08/02, 82 y.o.   MRN: [id number] Less than a week ago, the patient suffered a superficial laceration to his anterior left shin on a rusty "disc" from his tractor.  The skin now is erythematous and warm to the touch.  It is tender.  His last tetanus shot was 11 years ago Past Medical History:  Diagnosis Date   Allergy    Asthma    Diabetes mellitus type II, controlled (HCC)    GERD (gastroesophageal reflux disease)    pt states he takes omeprazole but doesnt have GERD   Hypertension    Obesity    Sleep apnea    uses CPAP   Past Surgical History:  Procedure Laterality Date   CHOLECYSTECTOMY     DENTAL SURGERY     Current Outpatient Medications on File Prior to Visit  Medication Sig Dispense Refill   aspirin EC 81 MG tablet Take 81 mg by mouth daily.     B Complex Vitamins (B COMPLEX 1 PO) Take by mouth.     Calcium Carb-Cholecalciferol (CALCIUM 1000 + D PO) Take 1 tablet by mouth 2 (two) times daily.     cetirizine (ZYRTEC) 10 MG tablet Take 1 tablet by mouth once daily 90 tablet 0   doxycycline (VIBRA-TABS) 100 MG tablet Take 1 tablet (100 mg total) by mouth 2 (two) times daily. 10 tablet 0   glucosamine-chondroitin 500-400 MG tablet Take 1 tablet by mouth 2 (two) times daily.     losartan (COZAAR) 50 MG tablet Take 1 tablet (50 mg total) by mouth daily. 90 tablet 3   metFORMIN (GLUCOPHAGE) 500 MG tablet Take 1 tablet (500 mg total) by mouth 2 (two) times daily with a meal. 180 tablet 3   Multiple Vitamins-Minerals (MULTIVITAMIN WITH MINERALS) tablet Take 1 tablet daily by mouth.     omeprazole (PRILOSEC) 40 MG capsule Take 1 capsule by mouth once daily 90 capsule 3   SYMBICORT 160-4.5 MCG/ACT inhaler Inhale 2 puffs by mouth twice daily 11 g 3   tamsulosin (FLOMAX) 0.4 MG CAPS capsule Take 1 capsule (0.4 mg total) by mouth daily. 90 capsule 3   VENTOLIN HFA 108 (90 Base) MCG/ACT inhaler INHALE 1 TO 2 PUFFS BY MOUTH  EVERY 6 HOURS AS NEEDED 18 g 0   zafirlukast (ACCOLATE) 20 MG tablet TAKE 1 TABLET BY MOUTH TWICE DAILY BEFORE  A  MEAL.  APPOINTMENT  REQUIRED  FOR  FUTURE  REFILLS 180 tablet 3   No current facility-administered medications on file prior to visit.   No Known Allergies Social History   Socioeconomic History   Marital status: Married    Spouse name: Elnita Maxwell   Number of children: 5   Years of education: Not on file   Highest education level: Not on file  Occupational History   Not on file  Tobacco Use   Smoking status: Never   Smokeless tobacco: Former    Types: Associate Professor Use: Never used  Substance and Sexual Activity   Alcohol use: Yes    Comment: occasional    Drug use: No   Sexual activity: Not Currently  Other Topics Concern   Not on file  Social History Narrative   3 children, 2 stepchildren   12 grandchildren   7 great grandchildren.   Retired Psychologist, forensic.    Social Determinants of Corporate investment banker  Strain: Low Risk  (10/05/2021)   Overall Financial Resource Strain (CARDIA)    Difficulty of Paying Living Expenses: Not hard at all  Food Insecurity: No Food Insecurity (10/05/2021)   Hunger Vital Sign    Worried About Running Out of Food in the Last Year: Never true    Ran Out of Food in the Last Year: Never true  Transportation Needs: No Transportation Needs (10/05/2021)   PRAPARE - Administrator, Civil Service (Medical): No    Lack of Transportation (Non-Medical): No  Physical Activity: Insufficiently Active (10/05/2021)   Exercise Vital Sign    Days of Exercise per Week: 3 days    Minutes of Exercise per Session: 30 min  Stress: No Stress Concern Present (10/05/2021)   [employer] of Occupational Health - Occupational Stress Questionnaire    Feeling of Stress : Not at all  Social Connections: Moderately Integrated (10/05/2021)   Social Connection and Isolation Panel [NHANES]    Frequency of Communication with  Friends and Family: More than three times a week    Frequency of Social Gatherings with Friends and Family: More than three times a week    Attends Religious Services: 1 to 4 times per year    Active Member of [employer] or Organizations: No    Attends Banker Meetings: Never    Marital Status: Married  Catering manager Violence: Not At Risk (10/05/2021)   Humiliation, Afraid, Rape, and Kick questionnaire    Fear of Current or Ex-Partner: No    Emotionally Abused: No    Physically Abused: No    Sexually Abused: No      Review of Systems  Respiratory:  Positive for cough.   All other systems reviewed and are negative.      Objective:   Physical Exam Vitals reviewed.  Constitutional:      General: He is not in acute distress.    Appearance: He is well-developed. He is not diaphoretic.  HENT:     Head: Normocephalic and atraumatic.  Neck:     Thyroid: No thyromegaly.     Vascular: No JVD.     Trachea: No tracheal deviation.  Cardiovascular:     Rate and Rhythm: Normal rate and regular rhythm.     Heart sounds: Normal heart sounds. No murmur heard.    No friction rub. No gallop.  Pulmonary:     Effort: Pulmonary effort is normal. Prolonged expiration present. No accessory muscle usage or respiratory distress.     Breath sounds: Decreased air movement present. No stridor. No wheezing, rhonchi or rales.  Chest:     Chest wall: No tenderness.  Musculoskeletal:     Cervical back: Normal range of motion and neck supple.       Legs:  Lymphadenopathy:     Cervical: No cervical adenopathy.  Skin:    General: Skin is warm.     Coloration: Skin is not pale.     Findings: Erythema and lesion present. No rash.  Neurological:     Mental Status: He is alert.     Motor: No abnormal muscle tone.     Deep Tendon Reflexes: Reflexes are normal and symmetric.           Assessment & Plan:  Cellulitis of left lower extremity Begin Keflex 500 mg 3 times daily for 7 days.   The patient's tetanus shot was updated.  Recheck if worsening

## 2022-08-20 ENCOUNTER — Encounter: Payer: Self-pay | Admitting: Internal Medicine

## 2022-08-20 ENCOUNTER — Ambulatory Visit: Payer: Medicare Other | Admitting: Internal Medicine

## 2022-08-20 VITALS — BP 120/66 | HR 78 | Ht 70.0 in | Wt 272.6 lb

## 2022-08-20 DIAGNOSIS — J849 Interstitial pulmonary disease, unspecified: Secondary | ICD-10-CM | POA: Diagnosis not present

## 2022-08-20 DIAGNOSIS — R0609 Other forms of dyspnea: Secondary | ICD-10-CM | POA: Diagnosis not present

## 2022-08-20 DIAGNOSIS — J679 Hypersensitivity pneumonitis due to unspecified organic dust: Secondary | ICD-10-CM | POA: Diagnosis not present

## 2022-08-20 DIAGNOSIS — R053 Chronic cough: Secondary | ICD-10-CM

## 2022-08-20 LAB — POCT EXHALED NITRIC OXIDE: FeNO level (ppb): 45

## 2022-08-20 NOTE — Addendum Note (Signed)
Addended by: Hedda Slade on: 08/20/2022 12:33 PM   Modules accepted: Orders

## 2022-08-20 NOTE — Patient Instructions (Addendum)
ICD-10-CM   1. ILD (interstitial lung disease) (HCC)  J84.9     2. 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 DOE (dyspnea on exertion)  R06.09     4. 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  There is also allergic airways - FeNO somewhat high and can be causing cough  Plan  #ILD - At some point we can consider bronchoscopy with lavage but no surgical lung biopsy due to risk -Also holding off on chronic prednisone because of risk -Start nintedanib low-dose protocol 100 mg twice daily  -Decided to go with nintedanib now that you are off Ozempic  -Risks of diarrhea extremely rare cardiac issues and diverticulitis all explained  -Benefit of no need for sunscreen prior to pirfenidone -Make sure there is no mold in the house -Avoid mold, 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 Marchelle Gearing to ensure good uptake with nintedanib  -This can be video visit -12 weeks -30 minutes with Dr. Marchelle Gearing but after pulmonary function test imprinted on him

## 2022-08-20 NOTE — Progress Notes (Signed)
OV 05/16/2022 -transfer of care to the ILD center with Dr. Marchelle Gearing.    Subjective:  Patient ID: William Howell, male , DOB: 1941-03-03 , age 82 y.o. , MRN: [id number] , ADDRESS: [address] [place] [address] PCP Donita Brooks, MD Patient Care Team: Donita Brooks, MD as PCP - General (Family Medicine) Rennis Golden Lisette Abu, MD as PCP - Cardiology (Cardiology) Rennis Golden Lisette Abu, MD as Consulting Physician (Cardiology)  This Provider for this visit: Treatment Team:  Attending Provider: Kalman Shan, MD    05/16/2022 -   Chief Complaint  Patient presents with   Consult    Cough,      HPI William Howell 82 y.o. -is maternal grandfather to Dr. Vilma Meckel pulmonologist at [facility] health.  He is transferring his care to Dr. Marchelle Gearing [me] because of concern of interstitial lung disease.  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  He does a lot of heavy manual work.  He has significant exposures that because of his farm work.  He has also obesity and has sleep apnea.  He uses CPAP at night previously followed by Dr. Val Eagle (currently unclear who his new sleep doctor will be].  He reports dyspnea on exertion for at least the last 10-12 years but for the last 1 year it is progressive.  It is relieved by rest.  However since October 2023 has had a cough it initially got better but then November 2023 it came back again.  He recollects being treated for pneumonia.  Around this time in December 2023 a few days before Christmas he did have a CT scan of the chest that I personally visualized it does show right lower lobe density consistent with pneumonia].  Since then after treatment of pneumonia his cough is better but is still not back at baseline.  His shortness of breath is also not back at baseline.  Regarding his cough overall it was worse compared to a year ago although better compared to fall 2023.  He does bring out some  occasional clear to light yellow phlegm.  He does cough at night.  He does also have wheezing with the cough he does clear the throat but there is no hemoptysis.  The cough does not get worse when he lies down.  There is no tickle in his throat.  [place] [facility] Comprehensive ILD Questionnaire  Symptoms:   Past Medical History :  -Obesity -Sleep apnea on CPAP -Review of the external records indicate that in 2019 he was being treated for asthma by Dr. Max Fickle. -Denies any connective tissue disease -Has hypertension Pneumonia right lower lobe December 2023 -He has had both COVID-vaccine and COVID disease but was never hospitalized for COVID.   ROS:  --Fatigue Shortness of breath Chronic cough Some weight loss Does not snore because of CPAP   FAMILY HISTORY of LUNG DISEASE:  -No family history of pulmonary fibrosis but his father did have COPD  PERSONAL EXPOSURE HISTORY:  -Non-smoker.  No marijuana use.  No cocaine use no intravenous drug use  HOME  EXPOSURE and HOBBY DETAILS :  -Lives and is fine to take a farm house single[place].  The home is 82 years old.  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  Since 1970s has been active farming.  He has been exposed to working in [employer].  Is been a daily farmer a lot of chemical exposure.  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  He is also done sewage work.  None animal feed production.  Been a consideration is.  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  Has done foundry work, Microbiologist, Management consultant, Location manager, woodwork presents on 5  working.  Has been exposed to pesticides.  PULMONARY TOXICITY HISTORY (27 items):  -He has been on and off prednisone for years but otherwise detailed pulmonary drug toxicity is negative  INVESTIGATIONS: -Last pulmonary function test in 2017 with reduced FVC but normal DLCO.  This is consistent with obesity. -CT chest without contrast December 2023:  -He does have right lower lobe pneumonia  -He does have ILD that I suspect is in a pattern not consistent with UIP/alternative to UIP pattern  -Onset: In 2017 CT scan of the chest he did not seem to have it v GGO/Air trapping  However 2022 he has some ILD changes. -   CT Chest data - CTWO contrast 04/04/22  Narrative & Impression  CLINICAL DATA:  Follow-up lung nodule seen on radiographs   EXAM: CT CHEST WITHOUT CONTRAST   TECHNIQUE: Multidetector CT imaging of the chest was performed following the standard protocol without IV contrast.   RADIATION DOSE REDUCTION: This exam was performed according to the departmental dose-optimization program which includes automated exposure control, adjustment of the mA and/or kV according to patient size and/or use of iterative reconstruction technique.   COMPARISON:  Radiographs 03/19/2022 and CTA chest 09/28/2021   FINDINGS: Cardiovascular: Normal heart size. No pericardial effusion. Aortic atherosclerotic calcification. The ascending aorta measures approximately 44 mm in diameter (coronal image 86) this previously measured 41 mm on CTA 09/28/2021 though direct comparison is limited by lack of IV contrast.   Mediastinum/Nodes: Increased size of multiple mediastinal lymph nodes. For example a right paratracheal node measures 1.0 cm (3/33), previously 0.6 cm. Unremarkable esophagus.   Lungs/Pleura: Patchy reticulonodular opacities and interstitial thickening are slightly progressed from 09/28/2021. Increased bronchial wall thickening and bronchiectasis/bronchiolectasis greatest in the  right lower lobe. Focal atelectasis/consolidation in the posterior right lower lobe. No pleural effusion or pneumothorax. No CT correlate for nodule seen on radiographs 03/19/2022.   Upper Abdomen: Cholecystectomy. Colonic diverticulosis without diverticulitis. No acute abnormality   Musculoskeletal: No chest wall mass or suspicious bone lesions identified.   IMPRESSION: 1. Pulmonary findings compatible with bronchitis/bronchiolitis with right lower lobe pneumonia. 2. Similar to slight progression of interstitial lung disease compared with 09/28/2021. 3. Ascending aortic aneurysm measures 44 mm in diameter on today's exam. This may be slightly increased from 09/28/2021 however comparison is difficult given differences in obliquity and lack of IV contrast. Continued annual imaging follow-up with CTA or MRA is recommended.     Electronically Signed   By: Minerva Fester M.D.   On: 04/08/2022 00:02     OV 06/28/2022  Subjective:  Patient ID: William Howell, male , DOB: 04-Mar-1941 , age 12 y.o. , MRN: [id number] , ADDRESS: [address] [place] [address] PCP Donita Brooks, MD Patient Care Team: Donita Brooks, MD as PCP - General (Family Medicine) Rennis Golden Lisette Abu, MD as PCP -  Cardiology (Cardiology) Rennis Golden Lisette Abu, MD as Consulting Physician (Cardiology)  This Provider for this visit: Treatment Team:  Attending Provider: Kalman Shan, MD  ILD workup in progress  06/28/2022 -   Chief Complaint  Patient presents with   Follow-up    COVID 05/28/2022.  Cough with yellow mucus.  SOB with exertion.   ILD workup in pgoress.  HPI William Howell 82 y.o. -here with wife to review CT, PFT and serology. Says In interim had dental extraction and after that has had dimnished PO and lost 27#. Now a week ago started Ozempoc. HE is hoping to lose more weight but is already having some diarrhea./ Also mid feb 2024 had a bout of covid that was mild though is  having some small amout of cough with yellow sputum .  His PFTs show mild decline in FVC and DLCO in 7 year. . Visualization of images - I Agree is features are NOT cw UIP, Agree is more c/w HP or NSIP. Agree is mild. However, I am not sure of radiologic progression.   We decissused that biopsy is indicated but I did recommend that risk for SLB is > benefit. Did indicate at some point we can consider bronch biopy but at this point was to sort out if there is progerssion on CT . PFTs ae suggesting progression. On radiology repot (Dec 2023 suggested progression) but the current 2024 reporrt is equivocal. If there is progression, would recommend anti-fibrotic but did indicate to him tht markers of ILD for him are indicative of mild burden and likely only very slow progression. 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 First HRCT 2017, Some other CTs in 2022 and 2023. He has dyspnea on exertion x 12  years but progressive x 1 year.  End of 2023 got Rx for PNA>  but this could be all obesity. His PFTs 2017 -> 2023 show decline 2.86L/70% -> 2.14L/55%. DLCO: has gone from 26.9 -> 19.9 in same period but both listed as normal > 80%. Need to figure out if he should go an anti-fibrotic so need to see CT and see if he has progressive phenotype on ILD because symptoms could be from obesity  and also because overall burden of ILD is mild.   - Date or time period of scan:  HRCT: 06/12/2022 HRCT: 01/15/2016 - RAST allergy neg 2017  - Discussion synopsis:  Feb 2024: ILD + , Upper lobe pednomnance, Primary feature is  ground glass, There is volume loss and archietctural distorion in Upper Lobe.   - What is the final conclusion per 2018 ATS/Fleischner Criteria - Alternative Diagnosis. Dx #1 is Chronic HP.  and worse since 2017 slowly. Also, in current FEb 2024 there is some acue process with nodularity - cocerconsuggestive of acute atypical process such as MAC   - Concordance with official report: DISCORDANT  Pathology discussion of biopsy: No lung biopsy   MDD Impression/Recs:   - Rx as HP - steroid can be problematic. Rx as progressive phentopye with anti-fibrotic. Also do bronc with BAL but no need for  TBbx      OV 08/20/2022  Subjective:  Patient ID: William Howell, male , DOB: 01-04-1941 , age 17 y.o. , MRN: [id number] , ADDRESS: [address] [place] [address] PCP Donita Brooks, MD Patient Care Team: Donita Brooks, MD as PCP - General (Family Medicine) Rennis Golden Lisette Abu, MD as PCP - Cardiology (Cardiology) Rennis Golden Lisette Abu, MD as Consulting Physician (Cardiology)  This Provider for this visit: Treatment Team:  Attending Provider: Kalman Shan, MD    08/20/2022 -   Chief Complaint  Patient presents with   Follow-up    F/up   ILD workup in progress: Post multidisciplinary case conference diagnosis hypersensitive pneumonitis from farming  -Disease burden is mild  Also chronic cough  -Normal RAST allergy testing 2017  -Elevated nitric oxide May 2024  -Chronic elevated used to feels  Morbid obesity  -Previously on Ozempic and stopped because of side effects; early 2024  HPI William Howell Plate 82 y.o. -returns for follow-up.  He says after the last visit and treating with antibiotic and prednisone his cough did improve but is still there.  He does not wake up because of the cough at night but it does wake his wife up.  He says it is a little bit better but he does still have yellow phlegm.  Review of the labs indicate in 2017 RAST allergy panel was normal.  His  blood eosinophils are always elevated.  His exam nitric oxide is slightly up.  His MAR shows that he is supposed to be taking Symbicort but he does not recollect taking it.  MAR also shows him to be taking zafirlukast but he is not sure if he is taking it.  I asked him to restart his Symbicort.  Regarding his ILD: We discussed with the case conference and the consensus was that the overall disease burden is mild but it is definitely progressive over many years.  The thought process is that he has hypersensitive pneumonitis.  Steroids was deemed risk particularly over the long-term because of his multiple comorbidities, obesity and his age.  Surgical lung biopsy also would be risky.  Bronchoscopy could be considered an antifibrotic could be considered.  Last visit I thought you should go with pirfenidone because he was on Ozempic and he was having some diarrhea.  However now he is off Ozempic because of the side effects.  He did lose weight.  He feels he is gaining weight without the Ozempic.  He will talk to his primary care about other drugs.  We discussed the consensus in the conference that antifibrotic should be indicated for him even though the disease burden is very mild because it is progressive slowly.  We discussed pirfenidone versus nintedanib.  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  We did talk about the diarrhea risk and also extremely high cardiac risk.  Overall shared decision making to take nintedanib.  Decided to start with a low-dose protocol and then slowly escalate.  Also discussed avoidance measures for mold.  He states he is to be exposed heavily to hay and mold and chemicals and fertilizer.  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  I advised him to be diligent about avoiding these exposures.  He is in agreement.    SYMPTOM SCALE - ILD 05/16/2022 06/28/2022  08/20/2022 272#  Current weight     O2 use ra ra ra  Shortness of Breath 0 -> 5 scale with 5 being worst (score 6 If unable to do)    At rest 1 1 3   Simple tasks - showers, clothes change, eating, shaving 2 2 3   Household (dishes, doing bed, laundry) 4 3 3   Shopping 3 3 4   Walking level at own pace 4 3 4   Walking up Stairs 4 3 4   Total (30-36) Dyspnea Score 11 15 21       Non-dyspnea symptoms (0-> 5 scale) 05/16/2022 06/28/2022  08/20/2022   How bad is your cough? 3 2 3   How bad is your fatigue 4 3 - Rx predn/abx 3  How bad is nausea 0 0 0  How bad is vomiting?  0 0 0  How bad is diarrhea? 0 0 - mild after ozempic start a wekk aog 0  How bad is anxiety? 1 0 0  How bad is depression 1 0 0  Any chronic pain - if so where and how bad 1 0 0     Simple office walk 224 (66+46 x 2) feet Pod A at [facility] x  3 laps goal with forehead probe 08/20/2022    O2 used ra   Number laps completed 15 x sit stand   Comments about pace good   Resting Pulse Ox/HR 97% and 79/min   Final Pulse Ox/HR 98% and 94/min   Desaturated </= 88% no   Desaturated <= 3% points no   Got Tachycardic >/= 90/min yes   Symptoms at end of test Level 4 dyspnea of 10   Miscellaneous comments x      PFT     Latest Ref Rng & Units 06/28/2022    9:00 AM 12/12/2015   10:53 AM  PFT Results  FVC-Pre L 2.14  2.86   FVC-Predicted Pre % 55  70   FVC-Post L 2.20    FVC-Predicted Post % 57    Pre FEV1/FVC % % 77  80   Post FEV1/FCV % % 85    FEV1-Pre L 1.64  2.28   FEV1-Predicted Pre % 60  77   FEV1-Post L 1.86    DLCO uncorrected ml/min/mmHg 19.66  26.92   DLCO UNC% % 83  86   DLCO corrected ml/min/mmHg 20.45    DLCO COR %Predicted % 86    DLVA Predicted % 131  122   TLC L 4.38  5.07   TLC % Predicted % 63  74   RV % Predicted % 70  73      Latest Reference Range & Units 06/30/14 08:24 12/12/15 10:54 07/22/16 10:11 05/09/17 08:57 02/12/18 09:08 01/16/19 16:21 02/21/20 08:15 05/01/21 09:11 05/16/22 10:29  Eosinophils Absolute 0.0 - 0.7 K/uL 0.7 0.7 0.2 432 373  0.1 422 486 0.5     has a past medical history of Allergy, Asthma, Diabetes mellitus type II, controlled (HCC), GERD (gastroesophageal reflux disease), Hypertension, Obesity, and Sleep apnea.   reports that he has never smoked. He has quit using smokeless tobacco.  His smokeless tobacco use included chew.  Past Surgical History:  Procedure Laterality Date   CHOLECYSTECTOMY     DENTAL SURGERY      No Known Allergies  Immunization History  Administered Date(s) Administered   Fluad Quad(high Dose 65+) 01/04/2019, 03/27/2020, 04/22/2022   Influenza, High Dose Seasonal PF 02/17/2017   Influenza,inj,Quad PF,6+ Mos 02/10/2014, 03/14/2015, 01/09/2016, 02/12/2018   Influenza-Unspecified 02/12/2018   PFIZER(Purple Top)SARS-COV-2 Vaccination 07/10/2019, 08/04/2019   Pneumococcal Conjugate-13 07/04/2014  Pneumococcal Polysaccharide-23 12/26/2011   Td 12/26/2011   Tdap 12/26/2011, 08/15/2022   Zoster Recombinat (Shingrix) 02/20/2021, 04/18/2021   Zoster, Live 02/10/2014    Family History  Problem Relation Age of Onset   Heart disease Father    Heart attack Father    Heart attack Sister    Colon cancer Neg Hx    Colon polyps Neg Hx    Esophageal cancer Neg Hx    Rectal cancer Neg Hx    Stomach cancer Neg Hx    Pancreatic cancer Neg Hx    Liver cancer Neg Hx    Prostate cancer Neg Hx      Current Outpatient Medications:    aspirin EC 81 MG tablet, Take 81 mg by mouth daily., Disp: , Rfl:    B Complex Vitamins (B COMPLEX 1 PO), Take by mouth., Disp: , Rfl:    Calcium Carb-Cholecalciferol (CALCIUM 1000 + D PO), Take 1 tablet by mouth 2 (two) times daily., Disp: , Rfl:    cephALEXin (KEFLEX) 500 MG capsule, Take 1 capsule (500 mg total) by mouth 3 (three) times daily., Disp: 21 capsule, Rfl: 0   cetirizine (ZYRTEC) 10 MG tablet, Take 1 tablet by mouth once daily, Disp: 90 tablet, Rfl: 0   doxycycline (VIBRA-TABS) 100 MG tablet, Take 1 tablet (100 mg total) by mouth 2 (two) times  daily., Disp: 10 tablet, Rfl: 0   glucosamine-chondroitin 500-400 MG tablet, Take 1 tablet by mouth 2 (two) times daily., Disp: , Rfl:    losartan (COZAAR) 50 MG tablet, Take 1 tablet (50 mg total) by mouth daily., Disp: 90 tablet, Rfl: 3   metFORMIN (GLUCOPHAGE) 500 MG tablet, Take 1 tablet (500 mg total) by mouth 2 (two) times daily with a meal., Disp: 180 tablet, Rfl: 3   Multiple Vitamins-Minerals (MULTIVITAMIN WITH MINERALS) tablet, Take 1 tablet daily by mouth., Disp: , Rfl:    omeprazole (PRILOSEC) 40 MG capsule, Take 1 capsule by mouth once daily, Disp: 90 capsule, Rfl: 3   SYMBICORT 160-4.5 MCG/ACT inhaler, Inhale 2 puffs by mouth twice daily, Disp: 11 g, Rfl: 3   tamsulosin (FLOMAX) 0.4 MG CAPS capsule, Take 1 capsule (0.4 mg total) by mouth daily., Disp: 90 capsule, Rfl: 3   VENTOLIN HFA 108 (90 Base) MCG/ACT inhaler, INHALE 1 TO 2 PUFFS BY MOUTH EVERY 6 HOURS AS NEEDED, Disp: 18 g, Rfl: 0   zafirlukast (ACCOLATE) 20 MG tablet, TAKE 1 TABLET BY MOUTH TWICE DAILY BEFORE  A  MEAL.  APPOINTMENT  REQUIRED  FOR  FUTURE  REFILLS, Disp: 180 tablet, Rfl: 3      Objective:   Vitals:   08/20/22 0854  BP: 120/66  Pulse: 78  SpO2: 99%  Weight: 272 lb 9.6 oz (123.7 kg)  Height: 5\' 10"  (1.778 m)    Estimated body mass index is 39.11 kg/m as calculated from the following:   Height as of this encounter: 5\' 10"  (1.778 m).   Weight as of this encounter: 272 lb 9.6 oz (123.7 kg).  @WEIGHTCHANGE @  [employer]   08/20/22 0854  Weight: 272 lb 9.6 oz (123.7 kg)     Physical Exam   General: No distress. Obese. Looks well Neuro: Alert and Oriented x 3. GCS 15. Speech normal Psych: Pleasant Resp:  Barrel Chest - no.  Wheeze - no, Crackles - mild , No overt respiratory distress CVS: Normal heart sounds. Murmurs - no Ext: Stigmata of Connective Tissue Disease - no HEENT: Normal upper airway. PEERL +.  No post nasal drip        Assessment:       ICD-10-CM   1. ILD (interstitial  lung disease) (HCC)  J84.9 POCT EXHALED NITRIC OXIDE    2. 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 DOE (dyspnea on exertion)  R06.09     4. Chronic cough  R05.3          Plan:     Patient Instructions     ICD-10-CM   1. ILD (interstitial lung disease) (HCC)  J84.9     2. 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 DOE (dyspnea on exertion)  R06.09     4. 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  There is also allergic airways - FeNO somewhat high and can be causing cough  Plan  #ILD - At some point we can consider bronchoscopy with lavage but no surgical lung biopsy due to risk -Also holding off on chronic prednisone because of risk -Start nintedanib low-dose protocol 100 mg twice daily  -Decided to go with nintedanib now that you are off Ozempic  -Risks of diarrhea extremely rare cardiac issues and diverticulitis all explained  -Benefit of no need for sunscreen prior to pirfenidone -Make sure there is no mold in the house -Avoid mold, 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 Marchelle Gearing to ensure good uptake with nintedanib  -This can be video visit -12 weeks -30 minutes with Dr. Marchelle Gearing but after pulmonary function test imprinted on him    SIGNATURE    Dr. Kalman Shan, M.D., F.C.C.P,   Pulmonary and Critical Care Medicine Staff Physician, [facility] Center Director - Interstitial Lung Disease  Program  Pulmonary Fibrosis [facility] at [facility] [place], [place], [address]  Pager: [phone], If no answer or between  15:00h - 7:00h: call [phone] Telephone: [phone]  10:04 AM 08/20/2022

## 2022-08-21 ENCOUNTER — Telehealth: Payer: Self-pay

## 2022-08-21 DIAGNOSIS — J849 Interstitial pulmonary disease, unspecified: Secondary | ICD-10-CM

## 2022-08-21 NOTE — Telephone Encounter (Signed)
Received New start paperwork for OFEV. Will update as we work through the benefits process.  Submitted a Prior Authorization request to [facility] for OFEV via CoverMyMeds. Will update once we receive a response.  Key: [id number]

## 2022-08-22 ENCOUNTER — Other Ambulatory Visit (HOSPITAL_COMMUNITY): Payer: Self-pay

## 2022-08-22 NOTE — Telephone Encounter (Signed)
Spoke with patient. He will drop off proof of income tomorrow afternoon or next week if possible  Chesley Mires, PharmD, MPH, BCPS, CPP Clinical Pharmacist (Rheumatology and Pulmonology)

## 2022-08-22 NOTE — Telephone Encounter (Signed)
Received notification from [facility] regarding a prior authorization for OFEV. Authorization has been APPROVED from 08/21/22 to 08/21/23. Approval letter sent to scan center.  Per test claim, copay for 30 days supply is [phone]  Authorization # [id number] Phone # [phone]  Placed in awaiting response folder pending income docs from patient. ATC patient - unable to reach. Left VM. MyChart message sent  Chesley Mires, PharmD, MPH, BCPS, CPP Clinical Pharmacist (Rheumatology and Pulmonology)

## 2022-08-26 NOTE — Telephone Encounter (Signed)
Submitted Patient Assistance Application to [employer] for OFEV along with provider portion, PA and income documents. Will update patient when we receive a response.  Phone #: [phone] Fax #: [phone] 

## 2022-09-02 DIAGNOSIS — G4737 Central sleep apnea in conditions classified elsewhere: Secondary | ICD-10-CM | POA: Diagnosis not present

## 2022-09-02 DIAGNOSIS — G4731 Primary central sleep apnea: Secondary | ICD-10-CM | POA: Diagnosis not present

## 2022-09-02 DIAGNOSIS — G4730 Sleep apnea, unspecified: Secondary | ICD-10-CM | POA: Diagnosis not present

## 2022-09-20 ENCOUNTER — Other Ambulatory Visit: Payer: Self-pay | Admitting: Emergency Medicine

## 2022-09-20 ENCOUNTER — Ambulatory Visit
Admission: RE | Admit: 2022-09-20 | Discharge: 2022-09-20 | Disposition: A | Discharge status: Home / Self Care | Payer: Medicare Other | Source: Ambulatory Visit | Attending: Internal Medicine | Admitting: Internal Medicine

## 2022-09-20 DIAGNOSIS — I712 Thoracic aortic aneurysm, without rupture, unspecified: Secondary | ICD-10-CM | POA: Diagnosis not present

## 2022-09-20 MED ORDER — IOPAMIDOL (ISOVUE-370) INJECTION 76%
75.0000 mL | Freq: Once | INTRAVENOUS | Status: AC | PRN
  Administered 2022-09-20: 75 mL via INTRAVENOUS

## 2022-09-27 ENCOUNTER — Other Ambulatory Visit: Payer: Self-pay | Admitting: Family Medicine

## 2022-09-27 ENCOUNTER — Telehealth: Payer: Self-pay

## 2022-09-27 DIAGNOSIS — M1990 Unspecified osteoarthritis, unspecified site: Secondary | ICD-10-CM | POA: Diagnosis not present

## 2022-09-27 DIAGNOSIS — Z6841 Body Mass Index (BMI) 40.0 and over, adult: Secondary | ICD-10-CM | POA: Diagnosis not present

## 2022-09-27 MED ORDER — CYCLOBENZAPRINE HCL 10 MG PO TABS: 10.0000 mg | ORAL_TABLET | Freq: Three times a day (TID) | ORAL | 0 refills | Status: DC | PRN

## 2022-09-27 NOTE — Telephone Encounter (Signed)
Pt called in stating that he may have pulled a muscle and would like to know if pcp would send in a muscle relaxer or prednisone please. Pt would like to speak with nurse please. Please advise  Cb#: [phone]

## 2022-09-30 ENCOUNTER — Encounter: Payer: Self-pay | Admitting: Primary Care

## 2022-09-30 ENCOUNTER — Ambulatory Visit (INDEPENDENT_AMBULATORY_CARE_PROVIDER_SITE_OTHER): Payer: Medicare Other | Admitting: Primary Care

## 2022-09-30 VITALS — BP 122/82 | HR 81 | Temp 98.3°F | Ht 67.0 in | Wt 273.0 lb

## 2022-09-30 DIAGNOSIS — J849 Interstitial pulmonary disease, unspecified: Secondary | ICD-10-CM | POA: Diagnosis not present

## 2022-09-30 MED ORDER — SYMBICORT 160-4.5 MCG/ACT IN AERO: 2.0000 | INHALATION_SPRAY | Freq: Two times a day (BID) | RESPIRATORY_TRACT | 3 refills | Status: DC

## 2022-09-30 NOTE — Assessment & Plan Note (Addendum)
-   Stable; Cough is not bothersome. Using Symbicort two puffs q 12 hours as needed

## 2022-09-30 NOTE — Assessment & Plan Note (Addendum)
-   He has progressive ILD, symptom burden is mild. 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  Recommend he start low dose Nintedanib 100mg  twice daily in early May 2024 but was not approved for patient assistance. We will reach out to our pharmacy team and Dr. Marchelle Gearing. No changes. He will need LFTs at follow-up. Due for spirometry in August and HRCT in Feb 2025.  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

## 2022-09-30 NOTE — Telephone Encounter (Signed)
Patient has not been started on OFEV, he tells me he was denied for patient assistance. His portion would be 1600 dollars and he can not afford that. Are there other options?

## 2022-09-30 NOTE — Progress Notes (Signed)
@Patient  ID: William Howell, male    DOB: 06/23/1940, 82 y.o.   MRN: [id number]  Chief Complaint  Patient presents with   Follow-up    Referring provider: Donita Brooks, MD  HPI: 82 year old male, never smoked. PMH significant for aortic valve regurgitation, RBBB, asthma, sleep apnea, ILD, covid 19, diabetes, obesity. Patient of Dr. Marchelle Gearing, last seen on 08/20/22.   Previous [facility] pulmonary encounter:  08/20/2022 -       Chief Complaint  Patient presents with   Follow-up      F/up    ILD workup in progress: Post multidisciplinary case conference diagnosis hypersensitive pneumonitis from farming             -Disease burden is mild   Also chronic cough             -Normal RAST allergy testing 2017             -Elevated nitric oxide May 2024             -Chronic elevated used to feels   Morbid obesity             -Previously on Ozempic and stopped because of side effects; early 2024   HPI William Howell 82 y.o. -returns for follow-up.  He says after the last visit and treating with antibiotic and prednisone his cough did improve but is still there.  He does not wake up because of the cough at night but it does wake his wife up.  He says it is a little bit better but he does still have yellow phlegm.  Review of the labs indicate in 2017 RAST allergy panel was normal.  His blood eosinophils are always elevated.  His exam nitric oxide is slightly up.  His MAR shows that he is supposed to be taking Symbicort but he does not recollect taking it.  MAR also shows him to be taking zafirlukast but he is not sure if he is taking it.  I asked him to restart his Symbicort.   Regarding his ILD: We discussed with the case conference and the consensus was that the overall disease burden is mild but it is definitely progressive over many years.  The thought process is that he has hypersensitive pneumonitis.  Steroids was deemed risk particularly over the long-term because of his multiple  comorbidities, obesity and his age.  Surgical lung biopsy also would be risky.  Bronchoscopy could be considered an antifibrotic could be considered.  Last visit I thought you should go with pirfenidone because he was on Ozempic and he was having some diarrhea.  However now he is off Ozempic because of the side effects.  He did lose weight.  He feels he is gaining weight without the Ozempic.  He will talk to his primary care about other drugs.  We discussed the consensus in the conference that antifibrotic should be indicated for him even though the disease burden is very mild because it is progressive slowly.  We discussed pirfenidone versus nintedanib.  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  We did talk about the diarrhea risk and also extremely high cardiac risk.  Overall shared decision making to take nintedanib.  Decided to start with a low-dose protocol and then slowly escalate.   Also discussed avoidance measures for mold.  He states he is to be exposed heavily to hay and mold and chemicals  and fertilizer.  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  I advised him to be diligent about avoiding these exposures.  He is in agreement.   09/30/2022- Interim hx  Patient presents today for 6 week follow-up/new OFEV start. He was started on low dose Nintedanib 100mg  twice daily in early May. He has progressive ILD, symptom burden is mild. 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   Breathing is baseline, no changes. He has no acute complaints today. He has not been coughing like he was. FENO was elevated at previous visit indicating allergic component contributing to cough. He has used Symbicort a couple of times but does not require daily.    SYMPTOM SCALE - ILD 05/16/2022 06/28/2022   08/20/2022 272# 09/30/2022   Current weight         O2 use ra ra ra RA   Shortness of Breath 0 -> 5 scale with 5 being worst (score 6 If unable to do)       At rest 1 1 3   0  Simple tasks - showers, clothes change, eating, shaving 2 2 3 3   Household (dishes, doing bed, laundry) 4 3 3 3   Shopping 3 3 4 3   Walking level at own pace 4 3 4 3   Walking up Stairs 4 3 4 4   Total (30-36) Dyspnea Score 11 15 21 16        No Known Allergies  Immunization History  Administered Date(s) Administered   Fluad Quad(high Dose 65+) 01/04/2019, 03/27/2020, 04/22/2022   Influenza, High Dose Seasonal PF 02/17/2017   Influenza,inj,Quad PF,6+ Mos 02/10/2014, 03/14/2015, 01/09/2016, 02/12/2018   Influenza-Unspecified 02/12/2018   PFIZER(Purple Top)SARS-COV-2 Vaccination 07/10/2019, 08/04/2019   Pneumococcal Conjugate-13 07/04/2014   Pneumococcal Polysaccharide-23 12/26/2011   Td 12/26/2011   Tdap 12/26/2011, 08/15/2022   Zoster Recombinat (Shingrix) 02/20/2021, 04/18/2021   Zoster, Live 02/10/2014    Past Medical History:  Diagnosis Date   Allergy    Asthma    Diabetes mellitus type II, controlled (HCC)    GERD (gastroesophageal reflux disease)    pt states he takes omeprazole but doesnt have GERD   Hypertension    Obesity    Sleep apnea    uses CPAP    Tobacco History: Social History   Tobacco Use  Smoking Status Never  Smokeless Tobacco Former   Types: Chew   Counseling given: Not Answered   Outpatient Medications Prior to Visit  Medication Sig Dispense Refill   aspirin EC 81 MG tablet Take 81 mg by mouth daily.     B Complex Vitamins (B COMPLEX 1 PO) Take by mouth.     Calcium Carb-Cholecalciferol (CALCIUM 1000 + D PO) Take 1 tablet by mouth 2 (two) times daily.     cetirizine (ZYRTEC) 10 MG tablet Take 1 tablet by mouth once daily 90 tablet 0   cyclobenzaprine (FLEXERIL) 10 MG tablet Take 1 tablet (10 mg total) by mouth 3 (three) times daily as needed for muscle spasms. 30 tablet 0   glucosamine-chondroitin 500-400 MG tablet Take 1 tablet by mouth 2 (two) times daily.     losartan (COZAAR) 50 MG tablet Take 1 tablet (50 mg total) by mouth daily. 90  tablet 3   metFORMIN (GLUCOPHAGE) 500 MG tablet Take 1 tablet (500 mg total) by mouth 2 (two) times daily with a meal. 180 tablet 3   Multiple Vitamins-Minerals (MULTIVITAMIN WITH MINERALS) tablet Take 1 tablet daily by mouth.     omeprazole (PRILOSEC) 40 MG capsule Take 1  capsule by mouth once daily 90 capsule 3   tamsulosin (FLOMAX) 0.4 MG CAPS capsule Take 1 capsule (0.4 mg total) by mouth daily. 90 capsule 3   VENTOLIN HFA 108 (90 Base) MCG/ACT inhaler INHALE 1 TO 2 PUFFS BY MOUTH EVERY 6 HOURS AS NEEDED 18 g 0   zafirlukast (ACCOLATE) 20 MG tablet TAKE 1 TABLET BY MOUTH TWICE DAILY BEFORE  A  MEAL.  APPOINTMENT  REQUIRED  FOR  FUTURE  REFILLS 180 tablet 3   cephALEXin (KEFLEX) 500 MG capsule Take 1 capsule (500 mg total) by mouth 3 (three) times daily. 21 capsule 0   doxycycline (VIBRA-TABS) 100 MG tablet Take 1 tablet (100 mg total) by mouth 2 (two) times daily. 10 tablet 0   SYMBICORT 160-4.5 MCG/ACT inhaler Inhale 2 puffs by mouth twice daily 11 g 3   No facility-administered medications prior to visit.      Review of Systems  Review of Systems  Constitutional: Negative.   HENT: Negative.    Respiratory:  Negative for cough, shortness of breath and wheezing.   Cardiovascular: Negative.      Physical Exam  BP 122/82 (BP Location: Left Arm, Patient Position: Sitting, Cuff Size: Normal)   Pulse 81   Temp 98.3 F (36.8 C) (Temporal)   Ht 5\' 7"  (1.702 m)   Wt 273 lb (123.8 kg)   SpO2 97%   BMI 42.76 kg/m  Physical Exam Constitutional:      General: He is not in acute distress.    Appearance: Normal appearance. He is obese. He is not ill-appearing.  Cardiovascular:     Rate and Rhythm: Normal rate.  Pulmonary:     Effort: Pulmonary effort is normal.     Breath sounds: Normal breath sounds.  Musculoskeletal:        General: Normal range of motion.  Skin:    General: Skin is warm and dry.  Neurological:     General: No focal deficit present.     Mental Status: He  is alert and oriented to person, place, and time. Mental status is at baseline.  Psychiatric:        Mood and Affect: Mood normal.        Behavior: Behavior normal.        Thought Content: Thought content normal.        Judgment: Judgment normal.      Lab Results:  CBC    Component Value Date/Time   WBC 11.1 (H) 05/16/2022 1029   RBC 4.49 05/16/2022 1029   HGB 13.3 05/16/2022 1029   HCT 40.6 05/16/2022 1029   PLT 338.0 05/16/2022 1029   MCV 90.4 05/16/2022 1029   MCH 29.6 05/01/2021 0911   MCHC 32.7 05/16/2022 1029   RDW 14.3 05/16/2022 1029   LYMPHSABS 3.1 05/16/2022 1029   MONOABS 1.1 (H) 05/16/2022 1029   EOSABS 0.5 05/16/2022 1029   BASOSABS 0.1 05/16/2022 1029    BMET    Component Value Date/Time   NA 144 09/26/2021 1008   K 5.3 (H) 09/26/2021 1008   CL 104 09/26/2021 1008   CO2 28 09/26/2021 1008   GLUCOSE 104 (H) 09/26/2021 1008   GLUCOSE 110 (H) 05/01/2021 0911   BUN 25 09/26/2021 1008   CREATININE 1.42 (H) 09/26/2021 1008   CREATININE 1.49 (H) 05/01/2021 0911   CALCIUM 9.7 09/26/2021 1008   GFRNONAA 54 (L) 02/21/2020 0815   GFRAA 62 02/21/2020 0815    BNP No results found for: "BNP"  ProBNP    Component Value Date/Time   PROBNP 41.0 12/12/2015 1138    Imaging: CT ANGIO CHEST AORTA W/CM & OR WO/CM  Result Date: 09/20/2022 CLINICAL DATA:  thoracic aortic aneursym EXAM: CT ANGIOGRAPHY CHEST WITH CONTRAST TECHNIQUE: Multidetector CT imaging of the chest was performed using the standard protocol during bolus administration of intravenous contrast. Multiplanar CT image reconstructions and MIPs were obtained to evaluate the vascular anatomy. RADIATION DOSE REDUCTION: This exam was performed according to the departmental dose-optimization program which includes automated exposure control, adjustment of the mA and/or kV according to patient size and/or use of iterative reconstruction technique. CONTRAST:  75mL ISOVUE-370 IOPAMIDOL (ISOVUE-370) INJECTION 76%  COMPARISON:  Chest XR, 03/19/2022. CT chest 06/12/2022. CTA chest, 09/28/2021. FINDINGS: CARDIOVASCULAR: Limitations by motion: Moderate Preferential opacification of the thoracic aorta. No evidence of thoracic aortic dissection. Aortic Root: --Valve: 2.6 cm --Sinuses: 3.9 cm --Sinotubular Junction: 3.6 cm Thoracic Aorta: --Ascending Aorta: 4.1 cm --Aortic Arch: 3.2 cm --Descending Aorta: 3.2 cm Other: Conventional 3-sided LEFT aortic arch. No atherosclerosis or hemodynamic stenosis at the origins of the great vessels. Normal heart size. No pericardial effusion. No segmental or larger pulmonary embolus. Mediastinum/Nodes: No enlarged mediastinal, hilar, or axillary lymph nodes. Thyroid gland, trachea, and esophagus demonstrate no significant findings. Lungs/Pleura: Similar degree and appearance of apically-predominant interstitial thickening, consistent with previously described pulmonary fibrosis. No focal consolidation, mass or suspicious pulmonary nodule. No pleural effusion or pneumothorax. Upper Abdomen: No acute abnormality.  Cholecystectomy. Musculoskeletal: No acute chest wall abnormality. Mild chronic superior endplate deformity at C2 vertebral body, likely a Schmorl's node. No acute or significant osseous findings. Review of the MIP images confirms the above findings. IMPRESSION: 1. 4.1 cm fusiform dilatation of the ascending thoracic aorta. Continue annual imaging followup by CTA or MRA. This recommendation follows 2010 ACCF/AHA/AATS/ACR/ASA/SCA/SCAI/SIR/STS/SVM Guidelines for the Diagnosis and Management of Patients with Thoracic Aortic Disease. Circulation. 2010; 121: Z610-R604. Aortic aneurysm NOS (ICD10-I71.9) 2. Apically-predominant interstitial thickening, consistent with pulmonary fibrosis. Findings better appreciated on described on recent comparison high-resolution CT chest (06/12/2022). Roanna Banning, MD [facility] Electronically Signed    By: Roanna Banning M.D.   On: 09/20/2022 10:53     Assessment & Plan:   ILD (interstitial lung disease) (HCC) - He has progressive ILD, symptom burden is mild. 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  Recommend he start low dose Nintedanib 100mg  twice daily in early May 2024 but was not approved for patient assistance. We will reach out to our pharmacy team and Dr. Marchelle Gearing. No changes. He will need LFTs at follow-up. Due for spirometry in August and HRCT in Feb 2025.  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   Asthma, chronic - Stable; Cough is not bothersome. Using Symbicort two puffs q 12 hours as needed   Glenford Bayley, NP 09/30/2022

## 2022-09-30 NOTE — Patient Instructions (Addendum)
We have reached out to pharmacy team about other options for medication coverage  Use Symbicort 2 puffs every 12 hours as needed for cough/asthma symptoms   You will need labs at follow-up once you have started antifibrotics   Follow-up: Please schedule visit in August with Dr. Marchelle Gearing with Spirometry/DLCO (30 min-ILD slot)

## 2022-10-01 ENCOUNTER — Other Ambulatory Visit: Payer: Self-pay | Admitting: Family Medicine

## 2022-10-01 ENCOUNTER — Ambulatory Visit: Payer: Medicare Other | Admitting: Primary Care

## 2022-10-01 ENCOUNTER — Telehealth: Payer: Self-pay

## 2022-10-01 MED ORDER — OFEV 150 MG PO CAPS: 150.0000 mg | ORAL_CAPSULE | Freq: Two times a day (BID) | ORAL | 1 refills | Status: DC

## 2022-10-01 MED ORDER — PREDNISONE 20 MG PO TABS: ORAL_TABLET | ORAL | 0 refills | Status: DC

## 2022-10-01 NOTE — Telephone Encounter (Signed)
Patient enrolled into pulmonary fibrosis grant through [employer] for total of $5500.  Award Period: 04/04/2022 - 10/01/2023 ID: [id number] BIN: [id number] PCN: [id number] Group: [id number] For pharmacy inquiries, contact PDMI at [phone] For patient inquiries, contact PAF at [phone]  Rx for Ofev 150mg  twice daily sent to [facility]. Will take 7-10 days to process rx as he is new patient. Will f/u with pt.  Chesley Mires, PharmD, MPH, BCPS, CPP Clinical Pharmacist (Rheumatology and Pulmonology)

## 2022-10-01 NOTE — Telephone Encounter (Signed)
Pt called and states he is still having issues with spasms in his thigh that radiates down his leg. I have made him an appointment for Thursday. Pt asks if he should have an x-ray done or if there is anything else he can do? A muscle relaxer was sent in for him on 6/14. Thanks.

## 2022-10-02 ENCOUNTER — Telehealth: Payer: Self-pay

## 2022-10-02 DIAGNOSIS — J849 Interstitial pulmonary disease, unspecified: Secondary | ICD-10-CM

## 2022-10-02 DIAGNOSIS — J84112 Idiopathic pulmonary fibrosis: Secondary | ICD-10-CM

## 2022-10-02 MED ORDER — OFEV 100 MG PO CAPS: 100.0000 mg | ORAL_CAPSULE | Freq: Two times a day (BID) | ORAL | 1 refills | Status: DC

## 2022-10-02 NOTE — Telephone Encounter (Signed)
ATC about Ofev new start, unable to reach, left voicemail for call back

## 2022-10-02 NOTE — Telephone Encounter (Signed)
Subjective:  Patient called today by [facility] pharmacy team for Ofev new start counseling.   Patient was last seen by Dr. Marchelle Gearing on 08/20/22. Pertinent past medical history includes ILD, sleep apnea, and asthma. No prior therapy.  History of CAD: unclear, possible MI from 2018 stress test History of MI: possible Current anticoagulant use: No History of HTN: Yes  History of elevated LFTs: No History of diarrhea, nausea, vomiting: No  Objective: No Known Allergies  Outpatient Encounter Medications as of 10/02/2022  Medication Sig   aspirin EC 81 MG tablet Take 81 mg by mouth daily.   B Complex Vitamins (B COMPLEX 1 PO) Take by mouth.   Calcium Carb-Cholecalciferol (CALCIUM 1000 + D PO) Take 1 tablet by mouth 2 (two) times daily.   cetirizine (ZYRTEC) 10 MG tablet Take 1 tablet by mouth once daily   cyclobenzaprine (FLEXERIL) 10 MG tablet Take 1 tablet (10 mg total) by mouth 3 (three) times daily as needed for muscle spasms.   glucosamine-chondroitin 500-400 MG tablet Take 1 tablet by mouth 2 (two) times daily.   losartan (COZAAR) 50 MG tablet Take 1 tablet (50 mg total) by mouth daily.   metFORMIN (GLUCOPHAGE) 500 MG tablet Take 1 tablet (500 mg total) by mouth 2 (two) times daily with a meal.   Multiple Vitamins-Minerals (MULTIVITAMIN WITH MINERALS) tablet Take 1 tablet daily by mouth.   Nintedanib (OFEV) 150 MG CAPS Take 1 capsule (150 mg total) by mouth 2 (two) times daily.   omeprazole (PRILOSEC) 40 MG capsule Take 1 capsule by mouth once daily   predniSONE (DELTASONE) 20 MG tablet 3 tabs poqday 1-2, 2 tabs poqday 3-4, 1 tab poqday 5-6   SYMBICORT 160-4.5 MCG/ACT inhaler Inhale 2 puffs into the lungs 2 (two) times daily.   tamsulosin (FLOMAX) 0.4 MG CAPS capsule Take 1 capsule (0.4 mg total) by mouth daily.   VENTOLIN HFA 108 (90 Base) MCG/ACT inhaler INHALE 1 TO 2 PUFFS BY MOUTH EVERY 6 HOURS AS NEEDED   zafirlukast (ACCOLATE) 20 MG tablet TAKE 1 TABLET BY MOUTH TWICE  DAILY BEFORE  A  MEAL.  APPOINTMENT  REQUIRED  FOR  FUTURE  REFILLS   No facility-administered encounter medications on file as of 10/02/2022.     Immunization History  Administered Date(s) Administered   Fluad Quad(high Dose 65+) 01/04/2019, 03/27/2020, 04/22/2022   Influenza, High Dose Seasonal PF 02/17/2017   Influenza,inj,Quad PF,6+ Mos 02/10/2014, 03/14/2015, 01/09/2016, 02/12/2018   Influenza-Unspecified 02/12/2018   PFIZER(Purple Top)SARS-COV-2 Vaccination 07/10/2019, 08/04/2019   Pneumococcal Conjugate-13 07/04/2014   Pneumococcal Polysaccharide-23 12/26/2011   Td 12/26/2011   Tdap 12/26/2011, 08/15/2022   Zoster Recombinat (Shingrix) 02/20/2021, 04/18/2021   Zoster, Live 02/10/2014      PFT's TLC  Date Value Ref Range Status  06/28/2022 4.38 L Final      CMP     Component Value Date/Time   NA 144 09/26/2021 1008   K 5.3 (H) 09/26/2021 1008   CL 104 09/26/2021 1008   CO2 28 09/26/2021 1008   GLUCOSE 104 (H) 09/26/2021 1008   GLUCOSE 110 (H) 05/01/2021 0911   BUN 25 09/26/2021 1008   CREATININE 1.42 (H) 09/26/2021 1008   CREATININE 1.49 (H) 05/01/2021 0911   CALCIUM 9.7 09/26/2021 1008   PROT 7.2 05/16/2022 1029   PROT 6.8 07/19/2019 0841   ALBUMIN 4.3 05/16/2022 1029   ALBUMIN 4.3 07/19/2019 0841   AST 15 05/16/2022 1029   ALT 13 05/16/2022 1029   ALKPHOS 73 05/16/2022  1029   BILITOT 0.3 05/16/2022 1029   BILITOT 0.4 07/19/2019 0841   GFRNONAA 54 (L) 02/21/2020 0815   GFRAA 62 02/21/2020 0815      CBC    Component Value Date/Time   WBC 11.1 (H) 05/16/2022 1029   RBC 4.49 05/16/2022 1029   HGB 13.3 05/16/2022 1029   HCT 40.6 05/16/2022 1029   PLT 338.0 05/16/2022 1029   MCV 90.4 05/16/2022 1029   MCH 29.6 05/01/2021 0911   MCHC 32.7 05/16/2022 1029   RDW 14.3 05/16/2022 1029   LYMPHSABS 3.1 05/16/2022 1029   MONOABS 1.1 (H) 05/16/2022 1029   EOSABS 0.5 05/16/2022 1029   BASOSABS 0.1 05/16/2022 1029      LFT's    Latest Ref Rng & Units  05/16/2022   10:29 AM 05/01/2021    9:11 AM 02/21/2020    8:15 AM  Hepatic Function  Total Protein 6.0 - 8.3 g/dL 7.2  6.8  6.6   Albumin 3.5 - 5.2 g/dL 4.3     AST 0 - 37 U/L 15  16  17    ALT 0 - 53 U/L 13  14  13    Alk Phosphatase 39 - 117 U/L 73     Total Bilirubin 0.2 - 1.2 mg/dL 0.3  0.4  0.4   Bilirubin, Direct 0.0 - 0.3 mg/dL 0.1         HRCT (47/82/95) MPRESSION: 1. Mild pulmonary fibrosis in a pattern without clear apical to basal gradient, featuring irregular peripheral interstitial opacity, some septal thickening, associated ground-glass opacity, mild tubular bronchiectasis and some suggestion of architectural distortion. Findings are suggestive of an alternative diagnosis (not UIP) per consensus guidelines, favor chronic hypersensitivity pneumonitis or chronic fibrotic phase NSIP given this appearance: Diagnosis of Idiopathic Pulmonary Fibrosis: An Official ATS/ERS/JRS/ALAT Clinical Practice Guideline. Am Rosezetta Schlatter Crit Care Med Vol 198, Iss 5, [phone], Dec 14 2016. 2. Multiple scattered areas of fine centrilobular and tree-in-bud nodularity, heterogeneous airspace opacity, and small consolidations are fluctuant in comparison to prior examination consistent with ongoing atypical infection such as atypical mycobacterium. 3. Mild diffuse bilateral bronchial wall thickening.  Assessment and Plan  Ofev Medication Management Thoroughly counseled patient on the efficacy, mechanism of action, dosing, administration, adverse effects, and monitoring parameters of Ofev. Patient verbalized understanding.  Goals of Therapy: Will not stop or reverse the progression of ILD. It will slow the progression of ILD.  Inhibits tyrosine kinase inhibitors which slow the fibrosis/progression of ILD -Significant reduction in the rate of disease progression was observed after treatment (61.1% [before] vs 33.3% [after], P?=?0.008) over 42 weeks.  Dosing: 100 mg (one capsule) by mouth twice  daily (approx 12 hours apart). Discussed taking with food approximately 12 hours apart. Discussed that capsule should not be crushed or split.  Adverse Effects: Nausea, vomiting, diarrhea (2 in 3 patients) appetite loss, weight loss - management of diarrhea with loperamide discussed including max use of 48 hours and max of 8 capsules per day. Abdominal pain (up to 1 in 5 patients) Nasopharyngitis (13%), UTI (6%) Risk of thrombosis (3%) and acute MI (2%) Hypertension (5%) Dizziness Fatigue (10%)  Monitoring: Monitor for diarrhea, nausea and vomiting, GI perforation, hepatotoxicity  Monitor LFTs - baseline, monthly for first 6 months, then every 3 months routinely Pregnancy test - baseline prn CBC w differential at baseline and every 3 months routinely  Access: Approval of Ofev through: insurance and grant Rx sent to: [facility] (pulmonary fibrosis team). [phone]  Medication Reconciliation A  drug regimen assessment was performed, including review of allergies, interactions, disease-state management, dosing and immunization history. Medications were reviewed with the patient, including name, instructions, indication, goals of therapy, potential side effects, importance of adherence, and safe use.  Anticoagulant use: No  Immunizations Patient is indicated for the influenzae, pneumonia, and shingles vaccinations. Patient has received 2 COVID19 vaccines.  This appointment required 20 minutes of patient care (this includes precharting, chart review, review of results, face-to-face care, etc.).  Thank you for involving pharmacy to assist in providing this patient's care.   Message sent on MyChart to patient with pharmacy information.  Darolyn Rua, PharmD Student [employer]

## 2022-10-03 ENCOUNTER — Ambulatory Visit (INDEPENDENT_AMBULATORY_CARE_PROVIDER_SITE_OTHER): Payer: Medicare Other | Admitting: Family Medicine

## 2022-10-03 ENCOUNTER — Encounter: Payer: Self-pay | Admitting: Family Medicine

## 2022-10-03 VITALS — BP 144/76 | HR 91 | Temp 97.4°F | Ht 67.0 in | Wt 271.8 lb

## 2022-10-03 DIAGNOSIS — M5432 Sciatica, left side: Secondary | ICD-10-CM | POA: Diagnosis not present

## 2022-10-03 MED ORDER — OXYCODONE-ACETAMINOPHEN 5-325 MG PO TABS: 1.0000 | ORAL_TABLET | ORAL | 0 refills | Status: AC | PRN

## 2022-10-03 MED ORDER — PREDNISONE 20 MG PO TABS: ORAL_TABLET | ORAL | 0 refills | Status: DC

## 2022-10-03 NOTE — Progress Notes (Signed)
Subjective:    Patient ID: William Howell, male    DOB: 03-Feb-1941, 82 y.o.   MRN: [id number]  Patient reports severe pain in his left posterior lateral hip.  The pain radiates down his lateral posterior lateral hip below his knee into his left calf all the way down to his left foot.  The pain is intense.  It is deep and throbbing.  He tried a muscle relaxer without any relief.  Friday I started him on a short prednisone taper pack which has helped some.  He denies any bowel or bladder incontinence.  He denies any saddle anesthesia.  He denies any leg weakness.  There is no visible abnormality in the left leg.  There is no bruising.  I am unable to elicit any tenderness to palpation in the hamstring, quadricep, calf muscle, or shin muscles.  There is no visible rash.  There is no warmth or erythema. Past Medical History:  Diagnosis Date   Allergy    Asthma    Diabetes mellitus type II, controlled (HCC)    GERD (gastroesophageal reflux disease)    pt states he takes omeprazole but doesnt have GERD   Hypertension    Obesity    Sleep apnea    uses CPAP   Past Surgical History:  Procedure Laterality Date   CHOLECYSTECTOMY     DENTAL SURGERY     Current Outpatient Medications on File Prior to Visit  Medication Sig Dispense Refill   aspirin EC 81 MG tablet Take 81 mg by mouth daily.     B Complex Vitamins (B COMPLEX 1 PO) Take by mouth.     Calcium Carb-Cholecalciferol (CALCIUM 1000 + D PO) Take 1 tablet by mouth 2 (two) times daily.     cetirizine (ZYRTEC) 10 MG tablet Take 1 tablet by mouth once daily 90 tablet 0   cyclobenzaprine (FLEXERIL) 10 MG tablet Take 1 tablet (10 mg total) by mouth 3 (three) times daily as needed for muscle spasms. 30 tablet 0   glucosamine-chondroitin 500-400 MG tablet Take 1 tablet by mouth 2 (two) times daily.     losartan (COZAAR) 50 MG tablet Take 1 tablet (50 mg total) by mouth daily. 90 tablet 3   metFORMIN (GLUCOPHAGE) 500 MG tablet Take 1 tablet (500  mg total) by mouth 2 (two) times daily with a meal. 180 tablet 3   Multiple Vitamins-Minerals (MULTIVITAMIN WITH MINERALS) tablet Take 1 tablet daily by mouth.     Nintedanib (OFEV) 100 MG CAPS Take 1 capsule (100 mg total) by mouth 2 (two) times daily. 180 capsule 1   omeprazole (PRILOSEC) 40 MG capsule Take 1 capsule by mouth once daily 90 capsule 3   SYMBICORT 160-4.5 MCG/ACT inhaler Inhale 2 puffs into the lungs 2 (two) times daily. 10.2 g 3   tamsulosin (FLOMAX) 0.4 MG CAPS capsule Take 1 capsule (0.4 mg total) by mouth daily. 90 capsule 3   VENTOLIN HFA 108 (90 Base) MCG/ACT inhaler INHALE 1 TO 2 PUFFS BY MOUTH EVERY 6 HOURS AS NEEDED 18 g 0   zafirlukast (ACCOLATE) 20 MG tablet TAKE 1 TABLET BY MOUTH TWICE DAILY BEFORE  A  MEAL.  APPOINTMENT  REQUIRED  FOR  FUTURE  REFILLS 180 tablet 3   No current facility-administered medications on file prior to visit.   No Known Allergies Social History   Socioeconomic History   Marital status: Married    Spouse name: Elnita Maxwell   Number of children: 5   Years  of education: Not on file   Highest education level: Not on file  Occupational History   Not on file  Tobacco Use   Smoking status: Never   Smokeless tobacco: Former    Types: Designer, multimedia Use   Vaping Use: Never used  Substance and Sexual Activity   Alcohol use: Yes    Comment: occasional    Drug use: No   Sexual activity: Not Currently  Other Topics Concern   Not on file  Social History Narrative   3 children, 2 stepchildren   12 grandchildren   7 great grandchildren.   Retired Psychologist, forensic.    Social Determinants of Health   Financial Resource Strain: Low Risk  (10/05/2021)   Overall Financial Resource Strain (CARDIA)    Difficulty of Paying Living Expenses: Not hard at all  Food Insecurity: No Food Insecurity (10/05/2021)   Hunger Vital Sign    Worried About Running Out of Food in the Last Year: Never true    Ran Out of Food in the Last Year: Never true   Transportation Needs: No Transportation Needs (10/05/2021)   PRAPARE - Administrator, Civil Service (Medical): No    Lack of Transportation (Non-Medical): No  Physical Activity: Insufficiently Active (10/05/2021)   Exercise Vital Sign    Days of Exercise per Week: 3 days    Minutes of Exercise per Session: 30 min  Stress: No Stress Concern Present (10/05/2021)   [employer] of Occupational Health - Occupational Stress Questionnaire    Feeling of Stress : Not at all  Social Connections: Moderately Integrated (10/05/2021)   Social Connection and Isolation Panel [NHANES]    Frequency of Communication with Friends and Family: More than three times a week    Frequency of Social Gatherings with Friends and Family: More than three times a week    Attends Religious Services: 1 to 4 times per year    Active Member of [employer] or Organizations: No    Attends Banker Meetings: Never    Marital Status: Married  Catering manager Violence: Not At Risk (10/05/2021)   Humiliation, Afraid, Rape, and Kick questionnaire    Fear of Current or Ex-Partner: No    Emotionally Abused: No    Physically Abused: No    Sexually Abused: No      Review of Systems  All other systems reviewed and are negative.      Objective:   Physical Exam Vitals reviewed.  Constitutional:      General: He is not in acute distress.    Appearance: He is well-developed. He is not diaphoretic.  HENT:     Head: Normocephalic and atraumatic.  Neck:     Thyroid: No thyromegaly.     Vascular: No JVD.     Trachea: No tracheal deviation.  Cardiovascular:     Rate and Rhythm: Normal rate and regular rhythm.     Heart sounds: Normal heart sounds. No murmur heard.    No friction rub. No gallop.  Pulmonary:     Effort: Pulmonary effort is normal. Prolonged expiration present. No accessory muscle usage or respiratory distress.     Breath sounds: Decreased air movement present. No stridor. No wheezing,  rhonchi or rales.  Chest:     Chest wall: No tenderness.  Musculoskeletal:     Cervical back: Normal range of motion and neck supple.     Lumbar back: No swelling, spasms, tenderness or bony tenderness. Decreased range  of motion.     Right lower leg: No edema.     Left lower leg: No edema.       Legs:  Lymphadenopathy:     Cervical: No cervical adenopathy.  Skin:    General: Skin is warm.  Neurological:     Mental Status: He is alert.     Motor: No abnormal muscle tone.     Deep Tendon Reflexes: Reflexes are normal and symmetric.           Assessment & Plan:  Left sided sciatica Patient is dealing with left-sided sciatica.  Begin prednisone 60 mg daily for 4 days, 40 mg daily for 4 days, 20 mg daily for 4 days then discontinue.  Recommended no heavy lifting.  Recommended rest.  Proceed with imaging of the low back numbness burning and sciatica pains persist despite conservative therapy

## 2022-10-08 ENCOUNTER — Telehealth: Payer: Self-pay | Admitting: Pharmacist

## 2022-10-08 NOTE — Telephone Encounter (Signed)
Called back patient, he stated that he has been in contact with [facility] pharmacy, set up shipment and provided them the grant information. Nothing further needed from [place] at this time.   Darolyn Rua, PharmD Student [employer]

## 2022-10-08 NOTE — Telephone Encounter (Signed)
PT ret Taryn's call on [facility]. Please try [phone]

## 2022-10-23 ENCOUNTER — Ambulatory Visit: Payer: Medicare Other | Admitting: Emergency Medicine

## 2022-10-24 MED ORDER — OFEV 100 MG PO CAPS: 100.0000 mg | ORAL_CAPSULE | Freq: Two times a day (BID) | ORAL | 1 refills | Status: DC

## 2022-10-24 NOTE — Telephone Encounter (Addendum)
Spoke with patient - he states he has not received O[facility]v shipment and is unsure why. He was referred back to PAP by [facility].  I spoke with [facility]. They stated that dx code associated with rx is not billable through grant. Rx resent for correct dx code. [facility] rep has also emailed billing team regarding new incoming rx.  Chesley Mires, PharmD, MPH, BCPS, CPP Clinical Pharmacist (Rheumatology and Pulmonology)

## 2022-10-24 NOTE — Addendum Note (Signed)
Addended by: Murrell Redden on: 10/24/2022 02:41 PM   Modules accepted: Orders

## 2022-10-31 NOTE — Telephone Encounter (Signed)
Per [facility] portal, Ofev has been shipped to patient on 10/29/22  Chesley Mires, PharmD, MPH, BCPS, CPP Clinical Pharmacist (Rheumatology and Pulmonology)

## 2022-11-06 ENCOUNTER — Other Ambulatory Visit: Payer: Self-pay | Admitting: Family Medicine

## 2022-11-20 ENCOUNTER — Ambulatory Visit (INDEPENDENT_AMBULATORY_CARE_PROVIDER_SITE_OTHER): Payer: Medicare Other | Admitting: Internal Medicine

## 2022-11-20 DIAGNOSIS — J849 Interstitial pulmonary disease, unspecified: Secondary | ICD-10-CM

## 2022-11-20 LAB — PULMONARY FUNCTION TEST
DL/VA % pred: 135 %
DL/VA: 5.25 ml/min/mmHg/L
DLCO cor % pred: 94 %
DLCO cor: 22.11 ml/min/mmHg
DLCO unc % pred: 94 %
DLCO unc: 22.11 ml/min/mmHg
FEF 25-75 Pre: 2.63 L/sec
FEF2575-%Pred-Pre: 147 %
FEV1-%Pred-Pre: 78 %
FEV1-Pre: 2.08 L
FEV1FVC-%Pred-Pre: 121 %
FEV6-%Pred-Pre: 67 %
FEV6-Pre: 2.38 L
FEV6FVC-%Pred-Pre: 106 %
FVC-%Pred-Pre: 63 %
FVC-Pre: 2.4 L
Pre FEV1/FVC ratio: 86 %
Pre FEV6/FVC Ratio: 99 %

## 2022-11-20 NOTE — Progress Notes (Signed)
Spiro/DLCO performed today. 

## 2022-11-20 NOTE — Patient Instructions (Signed)
Spiro/DLCO performed today. 

## 2022-11-25 ENCOUNTER — Ambulatory Visit (INDEPENDENT_AMBULATORY_CARE_PROVIDER_SITE_OTHER): Payer: Medicare Other | Admitting: Internal Medicine

## 2022-11-25 ENCOUNTER — Encounter: Payer: Self-pay | Admitting: Internal Medicine

## 2022-11-25 VITALS — BP 120/80 | HR 68 | Ht 67.0 in | Wt 274.8 lb

## 2022-11-25 DIAGNOSIS — Z5181 Encounter for therapeutic drug level monitoring: Secondary | ICD-10-CM | POA: Diagnosis not present

## 2022-11-25 DIAGNOSIS — J679 Hypersensitivity pneumonitis due to unspecified organic dust: Secondary | ICD-10-CM | POA: Diagnosis not present

## 2022-11-25 DIAGNOSIS — J45991 Cough variant asthma: Secondary | ICD-10-CM

## 2022-11-25 DIAGNOSIS — R053 Chronic cough: Secondary | ICD-10-CM

## 2022-11-25 DIAGNOSIS — I10 Essential (primary) hypertension: Secondary | ICD-10-CM

## 2022-11-25 DIAGNOSIS — J849 Interstitial pulmonary disease, unspecified: Secondary | ICD-10-CM

## 2022-11-25 LAB — HEPATIC FUNCTION PANEL
ALT: 11 U/L (ref 0–53)
AST: 14 U/L (ref 0–37)
Albumin: 4.1 g/dL (ref 3.5–5.2)
Alkaline Phosphatase: 67 U/L (ref 39–117)
Bilirubin, Direct: 0.1 mg/dL (ref 0.0–0.3)
Total Bilirubin: 0.4 mg/dL (ref 0.2–1.2)
Total Protein: 6.7 g/dL (ref 6.0–8.3)

## 2022-11-25 NOTE — Progress Notes (Signed)
OV 05/16/2022 -transfer of care to the ILD center with Dr. Marchelle Gearing.    Subjective:  Patient ID: William Howell, male , DOB: 09/10/1940 , age 82 y.o. , MRN: [id number] , ADDRESS: [address] [place] [address] PCP Donita Brooks, MD Patient Care Team: Donita Brooks, MD as PCP - General (Family Medicine) Rennis Golden Lisette Abu, MD as PCP - Cardiology (Cardiology) Rennis Golden Lisette Abu, MD as Consulting Physician (Cardiology)  This Provider for this visit: Treatment Team:  Attending Provider: Kalman Shan, MD    05/16/2022 -   Chief Complaint  Patient presents with   Consult    Cough,      HPI William Howell 82 y.o. -is maternal grandfather to Dr. Vilma Meckel pulmonologist at [facility].  He is transferring his care to Dr. Marchelle Gearing [me] because of concern of interstitial lung disease.  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  He does a lot of heavy manual work.  He has significant exposures that because of his farm work.  He has also obesity and has sleep apnea.  He uses CPAP at night previously followed by Dr. Val Eagle (currently unclear who his new sleep doctor will be].  He reports dyspnea on exertion for at least the last 10-12 years but for the last 1 year it is progressive.  It is relieved by rest.  However since October 2023 has had a cough it initially got better but then November 2023 it came back again.  He recollects being treated for pneumonia.  Around this time in December 2023 a few days before Christmas he did have a CT scan of the chest that I personally visualized it does show right lower lobe density consistent with pneumonia].  Since then after treatment of pneumonia his cough is better but is still not back at baseline.  His shortness of breath is also not back at baseline.  Regarding his cough overall it was worse compared to a year ago although better compared to fall 2023.  He does bring out some  occasional clear to light yellow phlegm.  He does cough at night.  He does also have wheezing with the cough he does clear the throat but there is no hemoptysis.  The cough does not get worse when he lies down.  There is no tickle in his throat.  [facility] Comprehensive ILD Questionnaire  Symptoms:   Past Medical History :  -Obesity -Sleep apnea on CPAP -Review of the external records indicate that in 2019 he was being treated for asthma by Dr. Max Fickle. -Denies any connective tissue disease -Has hypertension Pneumonia right lower lobe December 2023 -He has had both COVID-vaccine and COVID disease but was never hospitalized for COVID.   ROS:  --Fatigue Shortness of breath Chronic cough Some weight loss Does not snore because of CPAP   FAMILY HISTORY of LUNG DISEASE:  -No family history of pulmonary fibrosis but his father did have COPD  PERSONAL EXPOSURE HISTORY:  -Non-smoker.  No marijuana use.  No cocaine use no intravenous drug use  HOME  EXPOSURE and HOBBY DETAILS :  -Lives and is fine to take a farm house single[place].  The home is 82 years old.  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  Since 1970s has been active farming.  He has been exposed to working in [employer].  Is been a daily farmer a lot of chemical exposure.  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  He is also done sewage work.  None animal feed production.  Been a consideration is.  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  Has done foundry work, Microbiologist, Management consultant, Location manager, woodwork presents on 5  working.  Has been exposed to pesticides.  PULMONARY TOXICITY HISTORY (27 items):  -He has been on and off prednisone for years but otherwise detailed pulmonary drug toxicity is negative  INVESTIGATIONS: -Last pulmonary function test in 2017 with reduced FVC but normal DLCO.  This is consistent with obesity. -CT chest without contrast December 2023:  -He does have right lower lobe pneumonia  -He does have ILD that I suspect is in a pattern not consistent with UIP/alternative to UIP pattern  -Onset: In 2017 CT scan of the chest he did not seem to have it v GGO/Air trapping  However 2022 he has some ILD changes. -   CT Chest data - CTWO contrast 04/04/22  Narrative & Impression  CLINICAL DATA:  Follow-up lung nodule seen on radiographs   EXAM: CT CHEST WITHOUT CONTRAST   TECHNIQUE: Multidetector CT imaging of the chest was performed following the standard protocol without IV contrast.   RADIATION DOSE REDUCTION: This exam was performed according to the departmental dose-optimization program which includes automated exposure control, adjustment of the mA and/or kV according to patient size and/or use of iterative reconstruction technique.   COMPARISON:  Radiographs 03/19/2022 and CTA chest 09/28/2021   FINDINGS: Cardiovascular: Normal heart size. No pericardial effusion. Aortic atherosclerotic calcification. The ascending aorta measures approximately 44 mm in diameter (coronal image 86) this previously measured 41 mm on CTA 09/28/2021 though direct comparison is limited by lack of IV contrast.   Mediastinum/Nodes: Increased size of multiple mediastinal lymph nodes. For example a right paratracheal node measures 1.0 cm (3/33), previously 0.6 cm. Unremarkable esophagus.   Lungs/Pleura: Patchy reticulonodular opacities and interstitial thickening are slightly progressed from 09/28/2021. Increased bronchial wall thickening and bronchiectasis/bronchiolectasis greatest in the  right lower lobe. Focal atelectasis/consolidation in the posterior right lower lobe. No pleural effusion or pneumothorax. No CT correlate for nodule seen on radiographs 03/19/2022.   Upper Abdomen: Cholecystectomy. Colonic diverticulosis without diverticulitis. No acute abnormality   Musculoskeletal: No chest wall mass or suspicious bone lesions identified.   IMPRESSION: 1. Pulmonary findings compatible with bronchitis/bronchiolitis with right lower lobe pneumonia. 2. Similar to slight progression of interstitial lung disease compared with 09/28/2021. 3. Ascending aortic aneurysm measures 44 mm in diameter on today's exam. This may be slightly increased from 09/28/2021 however comparison is difficult given differences in obliquity and lack of IV contrast. Continued annual imaging follow-up with CTA or MRA is recommended.     Electronically Signed   By: Minerva Fester M.D.   On: 04/08/2022 00:02     OV 06/28/2022  Subjective:  Patient ID: William Howell, male , DOB: 1940-08-30 , age 71 y.o. , MRN: [id number] , ADDRESS: [address] [place] [address] PCP Donita Brooks, MD Patient Care Team: Donita Brooks, MD as PCP - General (Family Medicine) Rennis Golden Lisette Abu, MD as PCP -  Cardiology (Cardiology) Rennis Golden Lisette Abu, MD as Consulting Physician (Cardiology)  This Provider for this visit: Treatment Team:  Attending Provider: Kalman Shan, MD  ILD workup in progress  06/28/2022 -   Chief Complaint  Patient presents with   Follow-up    COVID 05/28/2022.  Cough with yellow mucus.  SOB with exertion.   ILD workup in pgoress.  HPI YAHIR PALLEY 82 y.o. -here with wife to review CT, PFT and serology. Says In interim had dental extraction and after that has had dimnished PO and lost 27#. Now a week ago started Ozempoc. HE is hoping to lose more weight but is already having some diarrhea./ Also mid feb 2024 had a bout of covid that was mild though is  having some small amout of cough with yellow sputum .  His PFTs show mild decline in FVC and DLCO in 7 year. . Visualization of images - I Agree is features are NOT cw UIP, Agree is more c/w HP or NSIP. Agree is mild. However, I am not sure of radiologic progression.   We decissused that biopsy is indicated but I did recommend that risk for SLB is > benefit. Did indicate at some point we can consider bronch biopy but at this point was to sort out if there is progerssion on CT . PFTs ae suggesting progression. On radiology repot (Dec 2023 suggested progression) but the current 2024 reporrt is equivocal. If there is progression, would recommend anti-fibrotic but did indicate to him tht markers of ILD for him are indicative of mild burden and likely only very slow progression. 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 First HRCT 2017, Some other CTs in 2022 and 2023. He has dyspnea on exertion x 12  years but progressive x 1 year.  End of 2023 got Rx for PNA>  but this could be all obesity. His PFTs 2017 -> 2023 show decline 2.86L/70% -> 2.14L/55%. DLCO: has gone from 26.9 -> 19.9 in same period but both listed as normal > 80%. Need to figure out if he should go an anti-fibrotic so need to see CT and see if he has progressive phenotype on ILD because symptoms could be from obesity  and also because overall burden of ILD is mild.   - Date or time period of scan:  HRCT: 06/12/2022 HRCT: 01/15/2016 - RAST allergy neg 2017  - Discussion synopsis:  Feb 2024: ILD + , Upper lobe pednomnance, Primary feature is  ground glass, There is volume loss and archietctural distorion in Upper Lobe.   - What is the final conclusion per 2018 ATS/Fleischner Criteria - Alternative Diagnosis. Dx #1 is Chronic HP.  and worse since 2017 slowly. Also, in current FEb 2024 there is some acue process with nodularity - cocerconsuggestive of acute atypical process such as MAC   - Concordance with official report: DISCORDANT  Pathology discussion of biopsy: No lung biopsy   MDD Impression/Recs:   - Rx as HP - steroid can be problematic. Rx as progressive phentopye with anti-fibrotic. Also do bronc with BAL but no need for  TBbx      OV 08/20/2022  Subjective:  Patient ID: William Howell, male , DOB: 02/16/1941 , age 70 y.o. , MRN: [id number] , ADDRESS: [address] [place] [address] PCP Donita Brooks, MD Patient Care Team: Donita Brooks, MD as PCP - General (Family Medicine) Rennis Golden Lisette Abu, MD as PCP - Cardiology (Cardiology) Rennis Golden Lisette Abu, MD as Consulting Physician (Cardiology)  This Provider for this visit: Treatment Team:  Attending Provider: Kalman Shan, MD    08/20/2022 -   Chief Complaint  Patient presents with   Follow-up    F/up  HPI William Howell 82 y.o. -returns for follow-up.  He says after the last visit and treating with antibiotic and prednisone his cough did improve but is still there.  He does not wake up because of the cough at night but it does wake his wife up.  He says it is a little bit better but he does still have yellow phlegm.  Review of the labs indicate in 2017 RAST allergy panel was normal.  His blood eosinophils are always elevated.  His exam nitric oxide is slightly up.  His MAR shows that he is supposed to be taking Symbicort but he does not recollect taking it.  MAR also shows him to be taking zafirlukast but he is not sure if he is taking it.  I asked him to restart his Symbicort.  Regarding his ILD: We discussed with the case conference and the  consensus was that the overall disease burden is mild but it is definitely progressive over many years.  The thought process is that he has hypersensitive pneumonitis.  Steroids was deemed risk particularly over the long-term because of his multiple comorbidities, obesity and his age.  Surgical lung biopsy also would be risky.  Bronchoscopy could be considered an antifibrotic could be considered.  Last visit I thought you should go with pirfenidone because he was on Ozempic and he was having some diarrhea.  However now he is off Ozempic because of the side effects.  He did lose weight.  He feels he is gaining weight without the Ozempic.  He will talk to his primary care about other drugs.  We discussed the consensus in the conference that antifibrotic should be indicated for him even though the disease burden is very mild because it is progressive slowly.  We discussed pirfenidone versus nintedanib.  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  We did talk about the diarrhea risk and also extremely high cardiac risk.  Overall shared decision making to take nintedanib.  Decided to start with a low-dose protocol and then slowly escalate.  Also discussed avoidance measures for mold.  He states he is to be exposed heavily to hay and mold and chemicals and fertilizer.  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  I advised him to be diligent about avoiding these exposures.  He is in agreement.   09/30/2022- Interim hx  Patient presents today for 6 week follow-up/new OFEV start. He was started on low dose Nintedanib 100mg  twice daily in early May. He has progressive ILD, symptom burden is mild. 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   Breathing is baseline, no changes. He has no acute complaints today. He has not been coughing like he was. FENO was elevated at previous visit indicating allergic component contributing to cough. He has used Symbicort a couple of times  but does  not require daily.     OV 11/25/2022  Subjective:  Patient ID: William Howell, male , DOB: [birth year] , age 72 y.o. , MRN: [id number] , ADDRESS: [address] [place] [address] PCP Donita Brooks, MD Patient Care Team: Donita Brooks, MD as PCP - General (Family Medicine) Rennis Golden Lisette Abu, MD as PCP - Cardiology (Cardiology) Rennis Golden Lisette Abu, MD as Consulting Physician (Cardiology)  This Provider for this visit: Treatment Team:  Attending Provider: Kalman Shan, MD   ILD workup in progress: Post multidisciplinary case conference diagnosis hypersensitive pneumonitis from farming  -Disease burden is mild  Also chronic cough  -Normal RAST allergy testing 2017  -Elevated nitric oxide May 2024    Morbid obesity  -Previously on Ozempic and stopped because of side effects; early 2024   11/25/2022 -   Chief Complaint  Patient presents with   Follow-up    F/up on PFT, BP high since starting ofev, diarrhea, and weight gain more the 2 lbs.      HPI William Howell 82 y.o. -presents for follow-up.  He is here with his wife who is an independent historian.  He had to go to the beach and so he delayed starting his nintedanib.  Has been taking it for 2 weeks now on the low-dose protocol 100 mg twice daily.  He had diarrhea initially but this resolved.  He is concerned that his unintended might be increasing his blood pressure.  Last night was 170 systolic.  This morning after he took his losartan and after the morning dose nintedanib here in our office it is normal.  However he does not check his night blood pressure on a consistent basis.  He checked it randomly.  We do not know if prenintedanib his night blood pressure was going up because he never checked it.  He also uses a wrist cuff monitor.  Therefore I shared with him the some uncertainty whether his nintedanib is really causing hypertension although I did admit that nintedanib can increase the blood  pressure but it is an uncommon side effect.  Currently is not having any cough.  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  He quit taking his Symbicort for no particular reason.  I recommended he restart it.  And also use a mask with his farming work.  He had pulmonary function test today and is stable.  Symptom scores are stable.    SYMPTOM SCALE - ILD 05/16/2022 06/28/2022   08/20/2022 272# 09/30/2022  11/25/2022   Current weight          O2 use ra ra ra RA  ra  Shortness of Breath 0 -> 5 scale with 5 being worst (score 6 If unable to do)        At rest 1 1 3  0 2  Simple tasks - showers, clothes change, eating, shaving 2 2 3 3 3   Household (dishes, doing bed, laundry) 4 3 3 3 4   Shopping 3 3 4 3 3   Walking level at own pace 4 3 4 3 3   Walking up Stairs 4 3 4 4 4   Total (30-36) Dyspnea Score 11 15 21 16 19      Non-dyspnea symptoms (0-> 5 scale) 05/16/2022 06/28/2022  08/20/2022  11/25/2022   How bad is your cough? 3 2 3 2   How bad is your fatigue 4 3 - Rx predn/abx 3 3  How bad is nausea 0 0 0 2  How bad is vomiting?  0 0 0 0  How bad is diarrhea? 0 0 - mild after ozempic start a wekk aog 0 1  How bad is anxiety? 1 0 0   How bad is depression 1 0 0   Any chronic pain - if so where and how bad 1 0 0      Simple office walk 224 (66+46 x 2) feet Pod A at [facility] x  3 laps goal with forehead probe 08/20/2022    O2 used ra   Number laps completed 15 x sit stand   Comments about pace good   Resting Pulse Ox/HR 97% and 79/min   Final Pulse Ox/HR 98% and 94/min   Desaturated </= 88% no   Desaturated <= 3% points no   Got Tachycardic >/= 90/min yes   Symptoms at end of test Level 4 dyspnea of 10   Miscellaneous comments x      PFT     Latest Ref Rng & Units 11/20/2022    8:57 AM 06/28/2022    9:00 AM 12/12/2015   10:53 AM  ILD indicators  FVC-Pre L 2.40  P 2.14  2.86   FVC-Predicted Pre % 63  P 55  70   FVC-Post L  2.20    FVC-Predicted Post %  57    TLC L  4.38  5.07    TLC Predicted %  63  74   DLCO uncorrected ml/min/mmHg 22.11  P 19.66  26.92   DLCO UNC %Pred % 94  P 83  86   DLCO Corrected ml/min/mmHg 22.11  P 20.45    DLCO COR %Pred % 94  P 86      P Preliminary result      LAB RESULTS last 96 hours No results found.  LAB RESULTS last 90 days Recent Results (from the past 2160 hour(s))  Pulmonary function test     Status: None (Preliminary result)   Collection Time: 11/20/22  8:57 AM  Result Value Ref Range   FVC-Pre 2.40 L   FVC-%Pred-Pre 63 %   FEV1-Pre 2.08 L   FEV1-%Pred-Pre 78 %   FEV6-Pre 2.38 L   FEV6-%Pred-Pre 67 %   Pre FEV1/FVC ratio 86 %   FEV1FVC-%Pred-Pre 121 %   Pre FEV6/FVC Ratio 99 %   FEV6FVC-%Pred-Pre 106 %   FEF 25-75 Pre 2.63 L/sec   FEF2575-%Pred-Pre 147 %   DLCO unc 22.11 ml/min/mmHg   DLCO unc % pred 94 %   DLCO cor 22.11 ml/min/mmHg   DLCO cor % pred 94 %   DL/VA 4.13 ml/min/mmHg/L   DL/VA % pred 244 %         has a past medical history of Allergy, Asthma, Diabetes mellitus type II, controlled (HCC), GERD (gastroesophageal reflux disease), Hypertension, Obesity, and Sleep apnea.   reports that he has never smoked. He has quit using smokeless tobacco.  His smokeless tobacco use included chew.  Past Surgical History:  Procedure Laterality Date   CHOLECYSTECTOMY     DENTAL SURGERY      No Known Allergies  Immunization History  Administered Date(s) Administered   Fluad Quad(high Dose 65+) 01/04/2019, 03/27/2020, 04/22/2022   Influenza, High Dose Seasonal PF 02/17/2017   Influenza,inj,Quad PF,6+ Mos 02/10/2014, 03/14/2015, 01/09/2016, 02/12/2018   Influenza-Unspecified 02/12/2018   PFIZER(Purple Top)SARS-COV-2 Vaccination 07/10/2019, 08/04/2019   Pneumococcal Conjugate-13 07/04/2014   Pneumococcal Polysaccharide-23 12/26/2011   Td 12/26/2011   Tdap 12/26/2011, 08/15/2022   Zoster Recombinant(Shingrix) 02/20/2021, 04/18/2021   Zoster, Live  02/10/2014    Family History  Problem Relation  Age of Onset   Heart disease Father    Heart attack Father    Heart attack Sister    Colon cancer Neg Hx    Colon polyps Neg Hx    Esophageal cancer Neg Hx    Rectal cancer Neg Hx    Stomach cancer Neg Hx    Pancreatic cancer Neg Hx    Liver cancer Neg Hx    Prostate cancer Neg Hx      Current Outpatient Medications:    aspirin EC 81 MG tablet, Take 81 mg by mouth daily., Disp: , Rfl:    B Complex Vitamins (B COMPLEX 1 PO), Take by mouth., Disp: , Rfl:    Calcium Carb-Cholecalciferol (CALCIUM 1000 + D PO), Take 1 tablet by mouth 2 (two) times daily., Disp: , Rfl:    cetirizine (ZYRTEC) 10 MG tablet, Take 1 tablet by mouth once daily, Disp: 90 tablet, Rfl: 0   cyclobenzaprine (FLEXERIL) 10 MG tablet, Take 1 tablet (10 mg total) by mouth 3 (three) times daily as needed for muscle spasms., Disp: 30 tablet, Rfl: 0   glucosamine-chondroitin 500-400 MG tablet, Take 1 tablet by mouth 2 (two) times daily., Disp: , Rfl:    losartan (COZAAR) 50 MG tablet, Take 1 tablet (50 mg total) by mouth daily., Disp: 90 tablet, Rfl: 3   metFORMIN (GLUCOPHAGE) 500 MG tablet, Take 1 tablet (500 mg total) by mouth 2 (two) times daily with a meal., Disp: 180 tablet, Rfl: 3   Multiple Vitamins-Minerals (MULTIVITAMIN WITH MINERALS) tablet, Take 1 tablet daily by mouth., Disp: , Rfl:    Nintedanib (OFEV) 100 MG CAPS, Take 1 capsule (100 mg total) by mouth 2 (two) times daily., Disp: 180 capsule, Rfl: 1   omeprazole (PRILOSEC) 40 MG capsule, Take 1 capsule by mouth once daily, Disp: 90 capsule, Rfl: 3   predniSONE (DELTASONE) 20 MG tablet, 3 tabs poqday 1-2, 2 tabs poqday 3-4, 1 tab poqday 5-6, Disp: 12 tablet, Rfl: 0   SYMBICORT 160-4.5 MCG/ACT inhaler, Inhale 2 puffs into the lungs 2 (two) times daily., Disp: 10.2 g, Rfl: 3   tamsulosin (FLOMAX) 0.4 MG CAPS capsule, Take 1 capsule (0.4 mg total) by mouth daily., Disp: 90 capsule, Rfl: 3   VENTOLIN HFA 108 (90 Base) MCG/ACT inhaler, INHALE 1 TO 2 PUFFS BY MOUTH  EVERY 6 HOURS AS NEEDED, Disp: 18 g, Rfl: 0   zafirlukast (ACCOLATE) 20 MG tablet, TAKE 1 TABLET BY MOUTH TWICE DAILY BEFORE  A  MEAL.  APPOINTMENT  REQUIRED  FOR  FUTURE  REFILLS, Disp: 180 tablet, Rfl: 3      Objective:   Vitals:   11/25/22 0929  BP: 120/80  Pulse: 68  SpO2: 97%  Weight: 274 lb 12.8 oz (124.6 kg)  Height: 5\' 7"  (1.702 m)    Estimated body mass index is 43.04 kg/m as calculated from the following:   Height as of this encounter: 5\' 7"  (1.702 m).   Weight as of this encounter: 274 lb 12.8 oz (124.6 kg).  @WEIGHTCHANGE @  [employer]   11/25/22 0929  Weight: 274 lb 12.8 oz (124.6 kg)     Physical Exam   General: No distress. obese O2 at rest: no Cane present: no Sitting in wheel chair: no Frail: no Obese: yes Neuro: Alert and Oriented x 3. GCS 15. Speech normal Psych: Pleasant Resp:  Barrel Chest - no.  Wheeze - no, Crackles - no, No  overt respiratory distress CVS: Normal heart sounds. Murmurs - no Ext: Stigmata of Connective Tissue Disease - no HEENT: Normal upper airway. PEERL +. No post nasal drip        Assessment:       ICD-10-CM   1. ILD (interstitial lung disease) (HCC)  J84.9     2. 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 Therapeutic drug monitoring  Z51.81     4. Chronic cough  R05.3     5. Cough variant asthma  J45.991     6. Hypertension, unspecified type  I10          Plan:     Patient Instructions     ICD-10-CM   1. ILD (interstitial lung disease) (HCC)  J84.9     2. 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 Therapeutic drug monitoring  Z51.81     4. Chronic cough  R05.3     5. Cough variant asthma  J45.991     6. 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  -Make sure you wear a mask at least especially a cane 95 -Check liver function test 11/25/2022 -Continue nintedanib 100 mg twice daily -Do video visit in 4-6 weeks to monitor nintedanib uptake -Check spirometry and DLCO in 5 months -Will do high-resolution CT chest supine and prone in February 2025    #COUgh -  - can be partly due to allergic asthma and why you are short course prednisone responsivle  Plan  - restart symbicort 2 puff twice daily  - will monitor  -if persistent can consider biologic injection therapy -Take Accolate of the [facility] [you are not taking this at this moment per your history]  #High blood pressure  -Ninetadnib (Ofev) might be causing the high blood pressure but on the other hand.  Night high blood pressure could just be something that was happening even before you started the Ofev and you just became aware right now  Plan - Continue losartan as before - Continue Ofev for your fibrosis - Have someone check your blood pressure consistently at daytime and at nighttime using an arm cuff [instead of a wrist cuff] and track your recordings  Follow-up - 6 weeks with nurse practitioner or Dr. Marchelle Gearing to ensure good uptake with nintedanib  -This is video visit -16 weeks -30 minutes with Dr. Marchelle Gearing but after pulmonary function test imprinted on him   FOLLOWUP Return in about 6 weeks (around 01/06/2023) for 6 weeks videow ith APP and 16 weeks with Barbee Mamula 30 min after PFT.    SIGNATURE    Dr. Kalman Shan, M.D., F.C.C.P,  Pulmonary and Critical Care Medicine Staff Physician, [facility] Director - Interstitial Lung Disease  Program  Pulmonary Fibrosis [facility] at [facility] [place], [place], [address]  Pager: [phone], If no answer or between  15:00h - 7:00h:  call [phone] Telephone: [phone]  10:07 AM 11/25/2022

## 2022-11-25 NOTE — Patient Instructions (Addendum)
ICD-10-CM   1. ILD (interstitial lung disease) (HCC)  J84.9     2. 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 Therapeutic drug monitoring  Z51.81     4. Chronic cough  R05.3     5. Cough variant asthma  J45.991     6. 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  -Make sure you wear a mask at least especially a cane 95 -Check liver function test 11/25/2022 -Continue nintedanib 100 mg twice daily -Do video visit in 4-6 weeks to monitor nintedanib uptake -Check spirometry and DLCO in 5 months -Will do high-resolution CT chest supine and prone in February 2025    #COUgh -  - can be partly due to allergic asthma and why you are short course prednisone responsivle  Plan  - restart symbicort 2 puff twice daily  - will monitor  -if persistent can consider biologic injection therapy -Take Accolate of the [facility] [you are not taking this at this moment per your history]  #High blood pressure  -Ninetadnib (Ofev) might be causing the high blood pressure but on the other hand.  Night high blood pressure could just be something that was happening even before you started the Ofev and you just became aware right now  Plan - Continue losartan as before - Continue Ofev for your fibrosis - Have someone check your blood pressure consistently at daytime and at nighttime using an arm cuff [instead of a wrist cuff] and track your recordings  Follow-up - 6 weeks with nurse practitioner or Dr. Marchelle Gearing to ensure good uptake with nintedanib  -This is video visit -16 weeks -30 minutes with Dr. Marchelle Gearing but after pulmonary function test  imprinted on him

## 2022-12-09 ENCOUNTER — Ambulatory Visit: Payer: Medicare Other | Admitting: Internal Medicine

## 2023-01-01 DIAGNOSIS — G4731 Primary central sleep apnea: Secondary | ICD-10-CM | POA: Diagnosis not present

## 2023-01-01 DIAGNOSIS — G4737 Central sleep apnea in conditions classified elsewhere: Secondary | ICD-10-CM | POA: Diagnosis not present

## 2023-01-01 DIAGNOSIS — G4730 Sleep apnea, unspecified: Secondary | ICD-10-CM | POA: Diagnosis not present

## 2023-01-14 ENCOUNTER — Ambulatory Visit: Payer: Medicare Other | Admitting: Internal Medicine

## 2023-01-21 ENCOUNTER — Telehealth: Payer: Self-pay

## 2023-01-21 ENCOUNTER — Telehealth: Payer: Self-pay | Admitting: Internal Medicine

## 2023-01-21 ENCOUNTER — Other Ambulatory Visit: Payer: Self-pay | Admitting: Internal Medicine

## 2023-01-21 ENCOUNTER — Encounter: Payer: Self-pay | Admitting: Internal Medicine

## 2023-01-21 ENCOUNTER — Ambulatory Visit (HOSPITAL_BASED_OUTPATIENT_CLINIC_OR_DEPARTMENT_OTHER): Payer: Medicare Other | Admitting: Internal Medicine

## 2023-01-21 ENCOUNTER — Ambulatory Visit: Payer: Medicare Other | Admitting: Internal Medicine

## 2023-01-21 VITALS — BP 137/83 | HR 72 | Ht 66.3 in | Wt 259.4 lb

## 2023-01-21 DIAGNOSIS — J209 Acute bronchitis, unspecified: Secondary | ICD-10-CM

## 2023-01-21 DIAGNOSIS — J45991 Cough variant asthma: Secondary | ICD-10-CM

## 2023-01-21 DIAGNOSIS — J679 Hypersensitivity pneumonitis due to unspecified organic dust: Secondary | ICD-10-CM | POA: Diagnosis not present

## 2023-01-21 DIAGNOSIS — R630 Anorexia: Secondary | ICD-10-CM

## 2023-01-21 DIAGNOSIS — R432 Parageusia: Secondary | ICD-10-CM | POA: Diagnosis not present

## 2023-01-21 DIAGNOSIS — R143 Flatulence: Secondary | ICD-10-CM

## 2023-01-21 DIAGNOSIS — Z5181 Encounter for therapeutic drug level monitoring: Secondary | ICD-10-CM

## 2023-01-21 DIAGNOSIS — K521 Toxic gastroenteritis and colitis: Secondary | ICD-10-CM

## 2023-01-21 DIAGNOSIS — J849 Interstitial pulmonary disease, unspecified: Secondary | ICD-10-CM | POA: Diagnosis not present

## 2023-01-21 LAB — CBC WITH DIFFERENTIAL/PLATELET
Basophils Absolute: 0 10*3/uL (ref 0.0–0.1)
Basophils Relative: 0.3 % (ref 0.0–3.0)
Eosinophils Absolute: 0.6 10*3/uL (ref 0.0–0.7)
Eosinophils Relative: 5.1 % — ABNORMAL HIGH (ref 0.0–5.0)
HCT: 41.4 % (ref 39.0–52.0)
Hemoglobin: 13.1 g/dL (ref 13.0–17.0)
Lymphocytes Relative: 23.7 % (ref 12.0–46.0)
Lymphs Abs: 2.8 10*3/uL (ref 0.7–4.0)
MCHC: 31.6 g/dL (ref 30.0–36.0)
MCV: 92 fL (ref 78.0–100.0)
Monocytes Absolute: 1.1 10*3/uL — ABNORMAL HIGH (ref 0.1–1.0)
Monocytes Relative: 9 % (ref 3.0–12.0)
Neutro Abs: 7.3 10*3/uL (ref 1.4–7.7)
Neutrophils Relative %: 61.9 % (ref 43.0–77.0)
Platelets: 313 10*3/uL (ref 150.0–400.0)
RBC: 4.5 Mil/uL (ref 4.22–5.81)
RDW: 14.3 % (ref 11.5–15.5)
WBC: 11.7 10*3/uL — ABNORMAL HIGH (ref 4.0–10.5)

## 2023-01-21 LAB — PULMONARY FUNCTION TEST
DL/VA % pred: 147 %
DL/VA: 5.82 ml/min/mmHg/L
DLCO cor % pred: 96 %
DLCO cor: 20.39 ml/min/mmHg
DLCO unc % pred: 96 %
DLCO unc: 20.39 ml/min/mmHg
FEF 25-75 Pre: 1.33 L/s
FEF2575-%Pred-Pre: 89 %
FEV1-%Pred-Pre: 77 %
FEV1-Pre: 1.76 L
FEV1FVC-%Pred-Pre: 106 %
FEV6-%Pred-Pre: 77 %
FEV6-Pre: 2.32 L
FEV6FVC-%Pred-Pre: 108 %
FVC-%Pred-Pre: 71 %
FVC-Pre: 2.32 L
Pre FEV1/FVC ratio: 76 %
Pre FEV6/FVC Ratio: 100 %

## 2023-01-21 MED ORDER — PREDNISONE 10 MG PO TABS: ORAL_TABLET | ORAL | 0 refills | Status: DC

## 2023-01-21 MED ORDER — AZITHROMYCIN 250 MG PO TABS: 250.0000 mg | ORAL_TABLET | Freq: Every day | ORAL | 0 refills | Status: DC

## 2023-01-21 NOTE — Patient Instructions (Signed)
Spirometry and DLCO Performed Today.  

## 2023-01-21 NOTE — Telephone Encounter (Signed)
Prescription has been sent.

## 2023-01-21 NOTE — Telephone Encounter (Signed)
PT's wife calling. They were here today to see Dr. Elvera Lennox. Upset the RX's have not been called into pharm.   AVS States:  Z PAK - Take prednisone 40 mg daily x 2 days, then 20mg  daily x 2 days, then 10mg  daily x 2 days, then 5mg  daily x 2 days and Stop

## 2023-01-21 NOTE — Progress Notes (Signed)
OV 05/16/2022 -transfer of care to the ILD center with Dr. Marchelle Gearing.    Subjective:  Patient ID: William Howell, male , DOB: 10/12/1940 , age 82 y.o. , MRN: [id number] , ADDRESS: [address] [place] [address] PCP Donita Brooks, MD Patient Care Team: Donita Brooks, MD as PCP - General (Family Medicine) Rennis Golden Lisette Abu, MD as PCP - Cardiology (Cardiology) Rennis Golden Lisette Abu, MD as Consulting Physician (Cardiology)  This Provider for this visit: Treatment Team:  Attending Provider: Kalman Shan, MD    05/16/2022 -   Chief Complaint  Patient presents with   Consult    Cough,      HPI William Howell 82 y.o. -is maternal grandfather to Dr. Vilma Meckel pulmonologist at [facility].  He is transferring his care to Dr. Marchelle Gearing [me] because of concern of interstitial lung disease.  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  He does a lot of heavy manual work.  He has significant exposures that because of his farm work.  He has also obesity and has sleep apnea.  He uses CPAP at night previously followed by Dr. Val Eagle (currently unclear who his new sleep doctor will be].  He reports dyspnea on exertion for at least the last 10-12 years but for the last 1 year it is progressive.  It is relieved by rest.  However since October 2023 has had a cough it initially got better but then November 2023 it came back again.  He recollects being treated for pneumonia.  Around this time in December 2023 a few days before Christmas he did have a CT scan of the chest that I personally visualized it does show right lower lobe density consistent with pneumonia].  Since then after treatment of pneumonia his cough is better but is still not back at baseline.  His shortness of breath is also not back at baseline.  Regarding his cough overall it was worse compared to a year ago although better compared to fall 2023.  He does bring out some  occasional clear to light yellow phlegm.  He does cough at night.  He does also have wheezing with the cough he does clear the throat but there is no hemoptysis.  The cough does not get worse when he lies down.  There is no tickle in his throat.  [facility] Comprehensive ILD Questionnaire  Symptoms:   Past Medical History :  -Obesity -Sleep apnea on CPAP -Review of the external records indicate that in 2019 he was being treated for asthma by Dr. Max Fickle. -Denies any connective tissue disease -Has hypertension Pneumonia right lower lobe December 2023 -He has had both COVID-vaccine and COVID disease but was never hospitalized for COVID.   ROS:  --Fatigue Shortness of breath Chronic cough Some weight loss Does not snore because of CPAP   FAMILY HISTORY of LUNG DISEASE:  -No family history of pulmonary fibrosis but his father did have COPD  PERSONAL EXPOSURE HISTORY:  -Non-smoker.  No marijuana use.  No cocaine use no intravenous drug use  HOME  EXPOSURE and HOBBY DETAILS :  -Lives and is fine to take a farm house single[place].  The home is 82 years old.  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  Since 1970s has been active farming.  He has been exposed to working in [employer].  Is been a daily farmer a lot of chemical exposure.  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  He is also done sewage work.  None animal feed production.  Been a consideration is.  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  Has done foundry work, Microbiologist, Management consultant, Location manager, woodwork presents on 5  working.  Has been exposed to pesticides.  PULMONARY TOXICITY HISTORY (27 items):  -He has been on and off prednisone for years but otherwise detailed pulmonary drug toxicity is negative  INVESTIGATIONS: -Last pulmonary function test in 2017 with reduced FVC but normal DLCO.  This is consistent with obesity. -CT chest without contrast December 2023:  -He does have right lower lobe pneumonia  -He does have ILD that I suspect is in a pattern not consistent with UIP/alternative to UIP pattern  -Onset: In 2017 CT scan of the chest he did not seem to have it v GGO/Air trapping  However 2022 he has some ILD changes. -   CT Chest data - CTWO contrast 04/04/22  Narrative & Impression  CLINICAL DATA:  Follow-up lung nodule seen on radiographs   EXAM: CT CHEST WITHOUT CONTRAST   TECHNIQUE: Multidetector CT imaging of the chest was performed following the standard protocol without IV contrast.   RADIATION DOSE REDUCTION: This exam was performed according to the departmental dose-optimization program which includes automated exposure control, adjustment of the mA and/or kV according to patient size and/or use of iterative reconstruction technique.   COMPARISON:  Radiographs 03/19/2022 and CTA chest 09/28/2021   FINDINGS: Cardiovascular: Normal heart size. No pericardial effusion. Aortic atherosclerotic calcification. The ascending aorta measures approximately 44 mm in diameter (coronal image 86) this previously measured 41 mm on CTA 09/28/2021 though direct comparison is limited by lack of IV contrast.   Mediastinum/Nodes: Increased size of multiple mediastinal lymph nodes. For example a right paratracheal node measures 1.0 cm (3/33), previously 0.6 cm. Unremarkable esophagus.   Lungs/Pleura: Patchy reticulonodular opacities and interstitial thickening are slightly progressed from 09/28/2021. Increased bronchial wall thickening and bronchiectasis/bronchiolectasis greatest in the  right lower lobe. Focal atelectasis/consolidation in the posterior right lower lobe. No pleural effusion or pneumothorax. No CT correlate for nodule seen on radiographs 03/19/2022.   Upper Abdomen: Cholecystectomy. Colonic diverticulosis without diverticulitis. No acute abnormality   Musculoskeletal: No chest wall mass or suspicious bone lesions identified.   IMPRESSION: 1. Pulmonary findings compatible with bronchitis/bronchiolitis with right lower lobe pneumonia. 2. Similar to slight progression of interstitial lung disease compared with 09/28/2021. 3. Ascending aortic aneurysm measures 44 mm in diameter on today's exam. This may be slightly increased from 09/28/2021 however comparison is difficult given differences in obliquity and lack of IV contrast. Continued annual imaging follow-up with CTA or MRA is recommended.     Electronically Signed   By: Minerva Fester M.D.   On: 04/08/2022 00:02     OV 06/28/2022  Subjective:  Patient ID: William Howell, male , DOB: 01/02/1941 , age 82 y.o. , MRN: [id number] , ADDRESS: [address] [place] [address] PCP Donita Brooks, MD Patient Care Team: Donita Brooks, MD as PCP - General (Family Medicine) Rennis Golden Lisette Abu, MD as PCP -  Cardiology (Cardiology) Rennis Golden Lisette Abu, MD as Consulting Physician (Cardiology)  This Provider for this visit: Treatment Team:  Attending Provider: Kalman Shan, MD  ILD workup in progress  06/28/2022 -   Chief Complaint  Patient presents with   Follow-up    COVID 05/28/2022.  Cough with yellow mucus.  SOB with exertion.   ILD workup in pgoress.  HPI William Howell 82 y.o. -here with wife to review CT, PFT and serology. Says In interim had dental extraction and after that has had dimnished PO and lost 27#. Now a week ago started Ozempoc. HE is hoping to lose more weight but is already having some diarrhea./ Also mid feb 2024 had a bout of covid that was mild though is  having some small amout of cough with yellow sputum .  His PFTs show mild decline in FVC and DLCO in 7 year. . Visualization of images - I Agree is features are NOT cw UIP, Agree is more c/w HP or NSIP. Agree is mild. However, I am not sure of radiologic progression.   We decissused that biopsy is indicated but I did recommend that risk for SLB is > benefit. Did indicate at some point we can consider bronch biopy but at this point was to sort out if there is progerssion on CT . PFTs ae suggesting progression. On radiology repot (Dec 2023 suggested progression) but the current 2024 reporrt is equivocal. If there is progression, would recommend anti-fibrotic but did indicate to him tht markers of ILD for him are indicative of mild burden and likely only very slow progression. 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 First HRCT 2017, Some other CTs in 2022 and 2023. He has dyspnea on exertion x 12  years but progressive x 1 year.  End of 2023 got Rx for PNA>  but this could be all obesity. His PFTs 2017 -> 2023 show decline 2.86L/70% -> 2.14L/55%. DLCO: has gone from 26.9 -> 19.9 in same period but both listed as normal > 80%. Need to figure out if he should go an anti-fibrotic so need to see CT and see if he has progressive phenotype on ILD because symptoms could be from obesity  and also because overall burden of ILD is mild.   - Date or time period of scan:  HRCT: 06/12/2022 HRCT: 01/15/2016 - RAST allergy neg 2017  - Discussion synopsis:  Feb 2024: ILD + , Upper lobe pednomnance, Primary feature is  ground glass, There is volume loss and archietctural distorion in Upper Lobe.   - What is the final conclusion per 2018 ATS/Fleischner Criteria - Alternative Diagnosis. Dx #1 is Chronic HP.  and worse since 2017 slowly. Also, in current FEb 2024 there is some acue process with nodularity - cocerconsuggestive of acute atypical process such as MAC   - Concordance with official report: DISCORDANT  Pathology discussion of biopsy: No lung biopsy   MDD Impression/Recs:   - Rx as HP - steroid can be problematic. Rx as progressive phentopye with anti-fibrotic. Also do bronc with BAL but no need for  TBbx      OV 08/20/2022  Subjective:  Patient ID: William Howell, male , DOB: 10/28/1940 , age 77 y.o. , MRN: [id number] , ADDRESS: [address] [place] [address] PCP Donita Brooks, MD Patient Care Team: Donita Brooks, MD as PCP - General (Family Medicine) Rennis Golden Lisette Abu, MD as PCP - Cardiology (Cardiology) Rennis Golden Lisette Abu, MD as Consulting Physician (Cardiology)  This Provider for this visit: Treatment Team:  Attending Provider: Kalman Shan, MD    08/20/2022 -   Chief Complaint  Patient presents with   Follow-up    F/up  HPI William Howell 82 y.o. -returns for follow-up.  He says after the last visit and treating with antibiotic and prednisone his cough did improve but is still there.  He does not wake up because of the cough at night but it does wake his wife up.  He says it is a little bit better but he does still have yellow phlegm.  Review of the labs indicate in 2017 RAST allergy panel was normal.  His blood eosinophils are always elevated.  His exam nitric oxide is slightly up.  His MAR shows that he is supposed to be taking Symbicort but he does not recollect taking it.  MAR also shows him to be taking zafirlukast but he is not sure if he is taking it.  I asked him to restart his Symbicort.  Regarding his ILD: We discussed with the case conference and the  consensus was that the overall disease burden is mild but it is definitely progressive over many years.  The thought process is that he has hypersensitive pneumonitis.  Steroids was deemed risk particularly over the long-term because of his multiple comorbidities, obesity and his age.  Surgical lung biopsy also would be risky.  Bronchoscopy could be considered an antifibrotic could be considered.  Last visit I thought you should go with pirfenidone because he was on Ozempic and he was having some diarrhea.  However now he is off Ozempic because of the side effects.  He did lose weight.  He feels he is gaining weight without the Ozempic.  He will talk to his primary care about other drugs.  We discussed the consensus in the conference that antifibrotic should be indicated for him even though the disease burden is very mild because it is progressive slowly.  We discussed pirfenidone versus nintedanib.  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  We did talk about the diarrhea risk and also extremely high cardiac risk.  Overall shared decision making to take nintedanib.  Decided to start with a low-dose protocol and then slowly escalate.  Also discussed avoidance measures for mold.  He states he is to be exposed heavily to hay and mold and chemicals and fertilizer.  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  I advised him to be diligent about avoiding these exposures.  He is in agreement.   09/30/2022- Interim hx  Patient presents today for 6 week follow-up/new OFEV start. He was started on low dose Nintedanib 100mg  twice daily in early May. He has progressive ILD, symptom burden is mild. 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   Breathing is baseline, no changes. He has no acute complaints today. He has not been coughing like he was. FENO was elevated at previous visit indicating allergic component contributing to cough. He has used Symbicort a couple of times  but does  not require daily.     OV 11/25/2022  Subjective:  Patient ID: William Howell, male , DOB: Dec 02, 1940 , age 36 y.o. , MRN: [id number] , ADDRESS: [address] [place] [address] PCP Donita Brooks, MD Patient Care Team: Donita Brooks, MD as PCP - General (Family Medicine) Rennis Golden Lisette Abu, MD as PCP - Cardiology (Cardiology) Rennis Golden Lisette Abu, MD as Consulting Physician (Cardiology)  This Provider for this visit: Treatment Team:  Attending Provider: Kalman Shan, MD   ILD workup in progress: Post multidisciplinary case conference diagnosis hypersensitive pneumonitis from farming  -Disease burden is mild  Also chronic cough  -Normal RAST allergy testing 2017  -Elevated nitric oxide May 2024    Morbid obesity  -Previously on Ozempic and stopped because of side effects; early 2024   11/25/2022 -   Chief Complaint  Patient presents with   Follow-up    F/up on PFT, BP high since starting ofev, diarrhea, and weight gain more the 2 lbs.      HPI William Howell 82 y.o. -presents for follow-up.  He is here with his wife who is an independent historian.  He had to go to the beach and so he delayed starting his nintedanib.  Has been taking it for 2 weeks now on the low-dose protocol 100 mg twice daily.  He had diarrhea initially but this resolved.  He is concerned that his unintended might be increasing his blood pressure.  Last night was 170 systolic.  This morning after he took his losartan and after the morning dose nintedanib here in our office it is normal.  However he does not check his night blood pressure on a consistent basis.  He checked it randomly.  We do not know if prenintedanib his night blood pressure was going up because he never checked it.  He also uses a wrist cuff monitor.  Therefore I shared with him the some uncertainty whether his nintedanib is really causing hypertension although I did admit that nintedanib can increase the blood  pressure but it is an uncommon side effect.  Currently is not having any cough.  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  He quit taking his Symbicort for no particular reason.  I recommended he restart it.  And also use a mask with his farming work.  He had pulmonary function test today and is stable.  Symptom scores are stable.      OV 01/21/2023  Subjective:  Patient ID: William Howell, male , DOB: 01-27-1941 , age 62 y.o. , MRN: [id number] , ADDRESS: [address] [place] [address] PCP Donita Brooks, MD Patient Care Team: Donita Brooks, MD as PCP - General (Family Medicine) Rennis Golden Lisette Abu, MD as PCP - Cardiology (Cardiology) Rennis Golden Lisette Abu, MD as Consulting Physician (Cardiology)  This Provider for this visit: Treatment Team:  Attending Provider: Kalman Shan, MD    01/21/2023 -   Chief Complaint  Patient presents with   Follow-up    Pt states he has not been well the last few days, cough, wheezing. Pt has not been using his albuterol inhaler or symbicort.    -Hypersensitivity progressive phenotype  -Wheezing phenotype not taking his Symbicort.  High blood eosinophils but normal RAST panel  - Nintedanib/Ofev requires intensive drug monitoring due to high concerns for Adverse effects of , including  Drug Induced Liver Injury, significant GI side effects that include but not limited to Diarrhea, Nausea, Vomiting,  and other  system side effects that include Fatigue,  weight loss. Cardiac side effects are a black box warning as well. These will be monitored with  blood work such as LFT initially once a month for 6 months and then quarterly   HPI William Howell 82 y.o. 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  He is here with his wife she is an independent historian.  He continues on nintedanib but he is having significant side effects.  He says initially they came on but resolved but for the last few weeks the back.   For the last few days he is only taking 100 mg once daily.  The symptoms are decreased appetite decreased p.o. intake decreased taste increased diarrhea and also flatulence.  He did pulmonary function test today and is stable through the course of this year.  In addition for the last 5 days is reporting increased cough increase sputum.  Change in color of sputum to yellow.  He is increased his farming activities for the fall.  He does unique technque Education administrator crop planting.  This increases the nitrogen content of the soil.  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  He says he is using a cover tractor.  There are no sick contacts.  The weather is also changes no fall in this range.  But it is unclear why he is having the symptoms.  He is got active wheezing today wife is really concerned about this he is also concerned about this.  He is not taking his Symbicort.  This because it was expensive.  Will try to get another inhaler for him.  He does have some samples of Breztri.    SYMPTOM SCALE - ILD 05/16/2022 06/28/2022   08/20/2022 272# 09/30/2022  11/25/2022  01/21/2023 ofev  Current weight           O2 use ra ra ra RA  ra ra  Shortness of Breath 0 -> 5 scale with 5 being worst (score 6 If unable to do)       2  At rest 1 1 3  0 2 2  Simple tasks - showers, clothes change, eating, shaving 2 2 3 3 3 2   Household (dishes, doing bed, laundry) 4 3 3 3 4 3   Shopping 3 3 4 3 3 3   Walking level at own pace 4 3 4 3 3 3   Walking up Stairs 4 3 4 4 4 4   Total (30-36) Dyspnea Score 11 15 21 16 19 17      Non-dyspnea symptoms (0-> 5 scale) 05/16/2022 06/28/2022  08/20/2022  11/25/2022  01/21/2023   How bad is your cough? 3 2 3 2 3   How bad is your fatigue 4 3 - Rx predn/abx 3 3 2   How bad is nausea 0 0 0 2 0  How bad is vomiting?  0 0 0 0 0  How bad is diarrhea? 0 0 - mild after ozempic start a wekk aog 0 1 3  How bad is anxiety? 1 0 0  0  How bad is depression 1 0 0  0  Any chronic  pain - if so where and how bad 1 0 0       Simple office walk 224 (66+46 x 2) feet Pod A at [facility] x  3 laps goal with forehead probe 08/20/2022    O2 used ra   Number laps completed 15 x sit stand   Comments about  pace good   Resting Pulse Ox/HR 97% and 79/min   Final Pulse Ox/HR 98% and 94/min   Desaturated </= 88% no   Desaturated <= 3% points no   Got Tachycardic >/= 90/min yes   Symptoms at end of test Level 4 dyspnea of 10   Miscellaneous comments x      PFT     Latest Ref Rng & Units 01/21/2023   11:30 AM 11/20/2022    8:57 AM 06/28/2022    9:00 AM 12/12/2015   10:53 AM  ILD indicators  FVC-Pre L 2.32  P 2.40  2.14  2.86   FVC-Predicted Pre % 71  P 63  55  70   FVC-Post L   2.20    FVC-Predicted Post %   57    TLC L   4.38  5.07   TLC Predicted %   63  74   DLCO uncorrected ml/min/mmHg 20.39  P 22.11  19.66  26.92   DLCO UNC %Pred % 96  P 94  83  86   DLCO Corrected ml/min/mmHg 20.39  P 22.11  20.45    DLCO COR %Pred % 96  P 94  86      P Preliminary result    No Known Allergies   LAB RESULTS last 96 hours No results found.  LAB RESULTS last 90 days Recent Results (from the past 2160 hour(s))  Pulmonary function test     Status: None   Collection Time: 11/20/22  8:57 AM  Result Value Ref Range   FVC-Pre 2.40 L   FVC-%Pred-Pre 63 %   FEV1-Pre 2.08 L   FEV1-%Pred-Pre 78 %   FEV6-Pre 2.38 L   FEV6-%Pred-Pre 67 %   Pre FEV1/FVC ratio 86 %   FEV1FVC-%Pred-Pre 121 %   Pre FEV6/FVC Ratio 99 %   FEV6FVC-%Pred-Pre 106 %   FEF 25-75 Pre 2.63 L/sec   FEF2575-%Pred-Pre 147 %   DLCO unc 22.11 ml/min/mmHg   DLCO unc % pred 94 %   DLCO cor 22.11 ml/min/mmHg   DLCO cor % pred 94 %   DL/VA 4.25 ml/min/mmHg/L   DL/VA % pred 956 %  Hepatic function panel     Status: None   Collection Time: 11/25/22 10:22 AM  Result Value Ref Range   Total Bilirubin 0.4 0.2 - 1.2 mg/dL   Bilirubin, Direct 0.1 0.0 - 0.3 mg/dL   Alkaline Phosphatase 67 39 - 117 U/L   AST 14 0  - 37 U/L   ALT 11 0 - 53 U/L   Total Protein 6.7 6.0 - 8.3 g/dL   Albumin 4.1 3.5 - 5.2 g/dL  Pulmonary function test     Status: None (Preliminary result)   Collection Time: 01/21/23 11:30 AM  Result Value Ref Range   FVC-Pre 2.32 L   FVC-%Pred-Pre 71 %   FEV1-Pre 1.76 L   FEV1-%Pred-Pre 77 %   FEV6-Pre 2.32 L   FEV6-%Pred-Pre 77 %   Pre FEV1/FVC ratio 76 %   FEV1FVC-%Pred-Pre 106 %   Pre FEV6/FVC Ratio 100 %   FEV6FVC-%Pred-Pre 108 %   FEF 25-75 Pre 1.33 L/sec   FEF2575-%Pred-Pre 89 %   DLCO unc 20.39 ml/min/mmHg   DLCO unc % pred 96 %   DLCO cor 20.39 ml/min/mmHg   DLCO cor % pred 96 %   DL/VA 3.87 ml/min/mmHg/L   DL/VA % pred 564 %     Latest Reference Range & Units 06/30/14 08:24 12/12/15 10:54 07/22/16 10:11  05/09/17 08:57 02/12/18 09:08 01/16/19 16:21 02/21/20 08:15 05/01/21 09:11 05/16/22 10:29  Eosinophils Absolute 0.0 - 0.7 K/uL 0.7 0.7 0.2 432 373 0.1 422 486 0.5       has a past medical history of Allergy, Asthma, Diabetes mellitus type II, controlled (HCC), GERD (gastroesophageal reflux disease), Hypertension, Obesity, and Sleep apnea.   reports that he has never smoked. He has quit using smokeless tobacco.  His smokeless tobacco use included chew.  Past Surgical History:  Procedure Laterality Date   CHOLECYSTECTOMY     DENTAL SURGERY      No Known Allergies  Immunization History  Administered Date(s) Administered   Fluad Quad(high Dose 65+) 01/04/2019, 03/27/2020, 04/22/2022   Influenza, High Dose Seasonal PF 02/17/2017   Influenza,inj,Quad PF,6+ Mos 02/10/2014, 03/14/2015, 01/09/2016, 02/12/2018   Influenza-Unspecified 02/12/2018   PFIZER(Purple Top)SARS-COV-2 Vaccination 07/10/2019, 08/04/2019   Pneumococcal Conjugate-13 07/04/2014   Pneumococcal Polysaccharide-23 12/26/2011   Td 12/26/2011   Tdap 12/26/2011, 08/15/2022   Zoster Recombinant(Shingrix) 02/20/2021, 04/18/2021   Zoster, Live 02/10/2014    Family History  Problem Relation Age of  Onset   Heart disease Father    Heart attack Father    Heart attack Sister    Colon cancer Neg Hx    Colon polyps Neg Hx    Esophageal cancer Neg Hx    Rectal cancer Neg Hx    Stomach cancer Neg Hx    Pancreatic cancer Neg Hx    Liver cancer Neg Hx    Prostate cancer Neg Hx      Current Outpatient Medications:    B Complex Vitamins (B COMPLEX 1 PO), Take by mouth., Disp: , Rfl:    Calcium Carb-Cholecalciferol (CALCIUM 1000 + D PO), Take 1 tablet by mouth 2 (two) times daily., Disp: , Rfl:    cetirizine (ZYRTEC) 10 MG tablet, Take 1 tablet by mouth once daily, Disp: 90 tablet, Rfl: 0   glucosamine-chondroitin 500-400 MG tablet, Take 1 tablet by mouth 2 (two) times daily., Disp: , Rfl:    losartan (COZAAR) 50 MG tablet, Take 1 tablet (50 mg total) by mouth daily., Disp: 90 tablet, Rfl: 3   metFORMIN (GLUCOPHAGE) 500 MG tablet, Take 1 tablet (500 mg total) by mouth 2 (two) times daily with a meal., Disp: 180 tablet, Rfl: 3   Multiple Vitamins-Minerals (MULTIVITAMIN WITH MINERALS) tablet, Take 1 tablet daily by mouth., Disp: , Rfl:    Nintedanib (OFEV) 100 MG CAPS, Take 1 capsule (100 mg total) by mouth 2 (two) times daily., Disp: 180 capsule, Rfl: 1   omeprazole (PRILOSEC) 40 MG capsule, Take 1 capsule by mouth once daily, Disp: 90 capsule, Rfl: 3   tamsulosin (FLOMAX) 0.4 MG CAPS capsule, Take 1 capsule (0.4 mg total) by mouth daily., Disp: 90 capsule, Rfl: 3   VENTOLIN HFA 108 (90 Base) MCG/ACT inhaler, INHALE 1 TO 2 PUFFS BY MOUTH EVERY 6 HOURS AS NEEDED, Disp: 18 g, Rfl: 0      Objective:   Vitals:   01/21/23 1304  BP: 137/83  Pulse: 72  SpO2: 94%  Weight: 259 lb 6.4 oz (117.7 kg)  Height: 5' 6.3" (1.684 m)    Estimated body mass index is 41.49 kg/m as calculated from the following:   Height as of this encounter: 5' 6.3" (1.684 m).   Weight as of this encounter: 259 lb 6.4 oz (117.7 kg).  @WEIGHTCHANGE @  [employer]   01/21/23 1304  Weight: 259 lb 6.4 oz (117.7  kg)  Physical Exam   General: No distress. Obese. Looks ok O2 at rest: no Cane present: no Sitting in wheel chair: no Frail: no Obese: no Neuro: Alert and Oriented x 3. GCS 15. Speech normal Psych: Pleasant Resp:  Barrel Chest - no.  Wheeze - YES DIFFUSE, Crackles - no, No overt respiratory distress CVS: Normal heart sounds. Murmurs - no Ext: Stigmata of Connective Tissue Disease - no HEENT: Normal upper airway. PEERL +. No post nasal drip        Assessment:       ICD-10-CM   1. ILD (interstitial lung disease) (HCC)  J84.9 CBC with Differential    Hepatic function panel    2. 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 Therapeutic drug monitoring  Z51.81     4. Dysgeusia  R43.2     5. Flatulence  R14.3     6. Decreased appetite  R63.0     7. Diarrhea due to drug  K52.1     8. Cough variant asthma  J45.991     9. 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   -Pulmonary function test is stable at this visit MArch 2024 compared to  01/21/2023 -Symptoms are also stable - nintedanib causing LOT of sid effects -  Plan --Check liver function test 01/21/2023 -STOP  nintedanib for  3 weeks  - then restarted 100 mg once daily for 1 week and then escalate to 100 mg twice daily  -If GI symptoms record after restart of nintedanib then we have to stop it altogether.     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 Sherrin Daisy Return in about 6 weeks (around 03/04/2023) for 15 min visit, VIDEO VISIT, with Dr Marchelle Gearing.    SIGNATURE    Dr. Kalman Shan, M.D., F.C.C.P,  Pulmonary and Critical Care Medicine Staff Physician, [facility] Director - Interstitial Lung Disease  Program  Pulmonary Fibrosis [place] [facility] at [facility], [place], [address]  Pager: [phone], If no answer or between  15:00h - 7:00h: call [phone] Telephone: [phone]  1:30 PM 01/21/2023

## 2023-01-21 NOTE — Patient Instructions (Addendum)
ILD (interstitial lung disease) (HCC) Hypersensitivity pneumonitis due to organic dust [facility]) Therapeutic drug monitoring Dysgeusia Flatulence Decreased appetite Diarrhea due to drug    #ILD secondary to hypersensitive pneumonitis.   -Pulmonary function test is stable at this visit MArch 2024 compared to  01/21/2023 -Symptoms are also stable - nintedanib causing LOT of sid effects -  Plan --Check liver function test 01/21/2023 -STOP  nintedanib for  3 weeks  - then restarted 100 mg once daily for 1 week and then escalate to 100 mg twice daily  -If GI symptoms record after restart of nintedanib then we have to stop it altogether.     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 Marchelle Gearing

## 2023-01-21 NOTE — Progress Notes (Signed)
Spirometry and DLCO Performed Today.  

## 2023-01-22 ENCOUNTER — Other Ambulatory Visit: Payer: Self-pay

## 2023-01-22 LAB — HEPATIC FUNCTION PANEL
ALT: 10 U/L (ref 0–53)
AST: 12 U/L (ref 0–37)
Albumin: 4 g/dL (ref 3.5–5.2)
Alkaline Phosphatase: 75 U/L (ref 39–117)
Bilirubin, Direct: 0.1 mg/dL (ref 0.0–0.3)
Total Bilirubin: 0.5 mg/dL (ref 0.2–1.2)
Total Protein: 6.9 g/dL (ref 6.0–8.3)

## 2023-01-22 MED ORDER — AZITHROMYCIN 250 MG PO TABS: ORAL_TABLET | ORAL | 0 refills | Status: AC

## 2023-01-25 NOTE — Telephone Encounter (Signed)
Encounter open in error 

## 2023-01-30 ENCOUNTER — Telehealth: Payer: Self-pay | Admitting: Internal Medicine

## 2023-01-30 NOTE — Telephone Encounter (Signed)
LEt CORDALE MANERA know that blood eos is HIGH and I /dw his grandson Dr Judeth Horn - this can be causing his reepated wheezing. One way to help reduce his cough, and repeated flare up is to go on a biologic like fasenra or dupixent  Plan  - if patient on board, pls refer to pharmacy

## 2023-01-31 ENCOUNTER — Other Ambulatory Visit: Payer: Self-pay | Admitting: Family Medicine

## 2023-01-31 DIAGNOSIS — G4737 Central sleep apnea in conditions classified elsewhere: Secondary | ICD-10-CM | POA: Diagnosis not present

## 2023-01-31 DIAGNOSIS — G4730 Sleep apnea, unspecified: Secondary | ICD-10-CM | POA: Diagnosis not present

## 2023-01-31 DIAGNOSIS — G4731 Primary central sleep apnea: Secondary | ICD-10-CM | POA: Diagnosis not present

## 2023-02-01 NOTE — Telephone Encounter (Signed)
Mychart message sent to pt of results and recommendations per Dr. Marchelle Gearing. Will update once pt responds.

## 2023-02-03 NOTE — Telephone Encounter (Signed)
Requested medication (s) are due for refill today: ?  Requested medication (s) are on the active medication list: No  Last refill:  04/11/22  Future visit scheduled: No  Notes to clinic:  Manual review.    Requested Prescriptions  Pending Prescriptions Disp Refills   levofloxacin (LEVAQUIN) 500 MG tablet [Pharmacy Med Name: levoFLOXacin 500 MG Oral Tablet] 7 tablet 0    Sig: Take 1 tablet by mouth once daily for 7 days     Off-Protocol Failed - 01/31/2023  6:16 PM      Failed - Medication not assigned to a protocol, review manually.      Failed - Valid encounter within last 12 months    Recent Outpatient Visits           1 year ago Prediabetes   [facility] Medicine Tanya Nones, Priscille Heidelberg, MD   2 years ago Viral upper respiratory tract infection   [facility] Medicine Tanya Nones Priscille Heidelberg, MD   2 years ago Need for prophylactic vaccination and inoculation against influenza   [facility] Medicine [name], Priscille Heidelberg, MD   2 years ago Prediabetes   [facility] Tanya Nones, Priscille Heidelberg, MD   3 years ago Dysuria   [facility] [name], Priscille Heidelberg, MD       Future Appointments             In 2 months Kalman Shan, MD [facility] Pulmonary Care at [facility]

## 2023-02-11 ENCOUNTER — Ambulatory Visit (INDEPENDENT_AMBULATORY_CARE_PROVIDER_SITE_OTHER): Payer: Medicare Other | Admitting: Family Medicine

## 2023-02-11 VITALS — BP 120/72 | HR 60 | Temp 97.5°F | Ht 66.3 in | Wt 265.2 lb

## 2023-02-11 DIAGNOSIS — Z23 Encounter for immunization: Secondary | ICD-10-CM | POA: Diagnosis not present

## 2023-02-11 DIAGNOSIS — E119 Type 2 diabetes mellitus without complications: Secondary | ICD-10-CM | POA: Diagnosis not present

## 2023-02-11 MED ORDER — BREZTRI AEROSPHERE 160-9-4.8 MCG/ACT IN AERO: 2.0000 | INHALATION_SPRAY | Freq: Two times a day (BID) | RESPIRATORY_TRACT | 11 refills | Status: DC

## 2023-02-11 NOTE — Progress Notes (Signed)
Subjective:    Patient ID: William Howell, male    DOB: 12-Mar-1941, 82 y.o.   MRN: [id number]  06/17/22 Patient is here today for refills on his metformin.  He has a history of diabetes.  His last A1c was 6.3 that was over a year ago.  Since I saw the patient, he has met with his pulmonologist and they have ordered a high-resolution CT scan of the chest.  I reviewed the findings with him today.  The clinical appearance favors a hypersensitivity pneumonitis similar to farmers lung.  Patient is going his best to avoid irritants.  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  He thinks his breathing may be a little bit better.  His blood pressure today is well-controlled.  He has not started the Ozempic.  We discussed Ozempic as a means to help achieve weight loss try to help with breathing and control blood sugar however he has not yet started the medication.  At that time, my plan was: I think weight loss would help his breathing by reducing resistance and possible contribution of obesity hypoventilation syndrome.  Therefore I recommended he start Ozempic 0.5 mg subcu weekly and uptitrate monthly as tolerated.  I will check an A1c today.  Goal A1c is less than 6.5.  Continue the metformin at the present time  02/11/23 Patient is here today to recheck his diabetes.  His last hemoglobin A1c was 6.3 in March.  Since that time, the patient had to stop Ozempic because of an upset stomach.  He also had to quit ofev due to diarrhea.  Of note, he is on metformin but he states that metformin does not cause diarrhea for him.  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  Patient has had pain.  The flu shot.  He is due for an RSV vaccine.  He is due for a COVID-vaccine.  Blood pressure today is excellent at 120/72. Past Medical History:  Diagnosis Date   Allergy    Asthma    Diabetes mellitus type  II, controlled (HCC)    GERD (gastroesophageal reflux disease)    pt states he takes omeprazole but doesnt have GERD   Hypertension    Obesity    Sleep apnea    uses CPAP   Past Surgical History:  Procedure Laterality Date   CHOLECYSTECTOMY     DENTAL SURGERY     Current Outpatient Medications on File Prior to Visit  Medication Sig Dispense Refill   azithromycin (ZITHROMAX) 250 MG tablet TAKE 1 TABLET BY MOUTH ONCE DAILY 6 each 0   B Complex Vitamins (B COMPLEX 1 PO) Take by mouth.     Calcium Carb-Cholecalciferol (CALCIUM 1000 + D PO) Take 1 tablet by mouth 2 (two) times daily.     cetirizine (ZYRTEC) 10 MG tablet Take 1 tablet by mouth once daily 90 tablet 0   glucosamine-chondroitin 500-400 MG tablet Take 1 tablet by mouth 2 (two) times daily.     losartan (COZAAR) 50 MG tablet Take 1 tablet (50 mg total) by mouth daily. 90 tablet 3   metFORMIN (GLUCOPHAGE) 500 MG tablet Take 1 tablet (500 mg total) by mouth 2 (two) times daily with a meal. 180 tablet 3   Multiple Vitamins-Minerals (MULTIVITAMIN WITH MINERALS) tablet Take 1 tablet daily by mouth.     Nintedanib (OFEV) 100 MG CAPS Take  1 capsule (100 mg total) by mouth 2 (two) times daily. 180 capsule 1   omeprazole (PRILOSEC) 40 MG capsule Take 1 capsule by mouth once daily 90 capsule 3   predniSONE (DELTASONE) 10 MG tablet Take 4 tabs for 2 days, then 3 tabs for 2 days, 2 tabs for 2 days, then 1 tab for 2 days, then stop. 20 tablet 0   tamsulosin (FLOMAX) 0.4 MG CAPS capsule Take 1 capsule (0.4 mg total) by mouth daily. 90 capsule 3   VENTOLIN HFA 108 (90 Base) MCG/ACT inhaler INHALE 1 TO 2 PUFFS BY MOUTH EVERY 6 HOURS AS NEEDED 18 g 0   No current facility-administered medications on file prior to visit.   No Known Allergies Social History   Socioeconomic History   Marital status: Married    Spouse name: Elnita Maxwell   Number of children: 5   Years of education: Not on file   Highest education level: Not on file  Occupational  History   Not on file  Tobacco Use   Smoking status: Never   Smokeless tobacco: Former    Types: Engineer, drilling   Vaping status: Never Used  Substance and Sexual Activity   Alcohol use: Yes    Comment: occasional    Drug use: No   Sexual activity: Not Currently  Other Topics Concern   Not on file  Social History Narrative   3 children, 2 stepchildren   12 grandchildren   7 great grandchildren.   Retired Psychologist, forensic.    Social Determinants of Health   Financial Resource Strain: Low Risk  (10/05/2021)   Overall Financial Resource Strain (CARDIA)    Difficulty of Paying Living Expenses: Not hard at all  Food Insecurity: No Food Insecurity (10/05/2021)   Hunger Vital Sign    Worried About Running Out of Food in the Last Year: Never true    Ran Out of Food in the Last Year: Never true  Transportation Needs: No Transportation Needs (10/05/2021)   PRAPARE - Administrator, Civil Service (Medical): No    Lack of Transportation (Non-Medical): No  Physical Activity: Insufficiently Active (10/05/2021)   Exercise Vital Sign    Days of Exercise per Week: 3 days    Minutes of Exercise per Session: 30 min  Stress: No Stress Concern Present (10/05/2021)   [employer] of Occupational Health - Occupational Stress Questionnaire    Feeling of Stress : Not at all  Social Connections: Moderately Integrated (10/05/2021)   Social Connection and Isolation Panel [NHANES]    Frequency of Communication with Friends and Family: More than three times a week    Frequency of Social Gatherings with Friends and Family: More than three times a week    Attends Religious Services: 1 to 4 times per year    Active Member of [employer] or Organizations: No    Attends Banker Meetings: Never    Marital Status: Married  Catering manager Violence: Not At Risk (10/05/2021)   Humiliation, Afraid, Rape, and Kick questionnaire    Fear of Current or Ex-Partner: No    Emotionally  Abused: No    Physically Abused: No    Sexually Abused: No      Review of Systems  Respiratory:  Positive for cough.   All other systems reviewed and are negative.      Objective:   Physical Exam Vitals reviewed.  Constitutional:      General: He is not in  acute distress.    Appearance: He is well-developed. He is not diaphoretic.  HENT:     Head: Normocephalic and atraumatic.     Right Ear: Tympanic membrane, ear canal and external ear normal.     Left Ear: Tympanic membrane, ear canal and external ear normal.     Mouth/Throat:     Pharynx: No oropharyngeal exudate or posterior oropharyngeal erythema.  Eyes:     General: No scleral icterus.       Right eye: No discharge.        Left eye: No discharge.     Conjunctiva/sclera: Conjunctivae normal.     Pupils: Pupils are equal, round, and reactive to light.  Neck:     Thyroid: No thyromegaly.     Vascular: No JVD.     Trachea: No tracheal deviation.  Cardiovascular:     Rate and Rhythm: Normal rate and regular rhythm.     Heart sounds: Normal heart sounds. No murmur heard.    No friction rub. No gallop.  Pulmonary:     Effort: Pulmonary effort is normal. Prolonged expiration present. No accessory muscle usage or respiratory distress.     Breath sounds: Decreased air movement present. No stridor. Rhonchi and rales present. No wheezing.  Chest:     Chest wall: No tenderness.  Musculoskeletal:     Cervical back: Normal range of motion and neck supple.  Lymphadenopathy:     Cervical: No cervical adenopathy.  Skin:    General: Skin is warm.     Coloration: Skin is not pale.     Findings: No erythema or rash.  Neurological:     Mental Status: He is alert and oriented to person, place, and time.     Cranial Nerves: No cranial nerve deficit.     Motor: No abnormal muscle tone.     Coordination: Coordination normal.     Deep Tendon Reflexes: Reflexes are normal and symmetric.  Psychiatric:        Behavior: Behavior  normal.        Thought Content: Thought content normal.        Judgment: Judgment normal.           Assessment & Plan:  Diabetes mellitus without complication (HCC) - Plan: Hemoglobin A1c, BASIC METABOLIC PANEL WITH GFR, Protein / Creatinine Ratio, Urine  Flu vaccine need - Plan: Flu Vaccine Trivalent High Dose (Fluad) Blood pressure today is well-controlled.  Patient received his flu shot.  I reviewed his recent CBC and LFTs from pulmonology.  Check hemoglobin A1c.  Goal A1c is less than 6.5.  Consider Jardiance if elevated.  Check BMP to monitor renal function along with urine protein creatinine ratio.  If patient experiences diarrhea again with any new medication, I would recommend trying stop metformin prior to abandoning the respiratory medicine.  Recommended COVID and RSV vaccinations given his history of hypersensitivity pneumonitis, pulmonary fibrosis, and hypereosinophilic asthma

## 2023-02-12 LAB — BASIC METABOLIC PANEL WITH GFR
BUN/Creatinine Ratio: 17 (calc) (ref 6–22)
BUN: 22 mg/dL (ref 7–25)
CO2: 30 mmol/L (ref 20–32)
Calcium: 9.8 mg/dL (ref 8.6–10.3)
Chloride: 102 mmol/L (ref 98–110)
Creat: 1.26 mg/dL — ABNORMAL HIGH (ref 0.70–1.22)
Glucose, Bld: 86 mg/dL (ref 65–99)
Potassium: 5.1 mmol/L (ref 3.5–5.3)
Sodium: 142 mmol/L (ref 135–146)
eGFR: 57 mL/min/{1.73_m2} — ABNORMAL LOW (ref >=60)

## 2023-02-12 LAB — PROTEIN / CREATININE RATIO, URINE
Creatinine, Urine: 137 mg/dL (ref 20–320)
Protein/Creat Ratio: 139 mg/g{creat} (ref 25–148)
Protein/Creatinine Ratio: 0.139 mg/mg{creat} (ref 0.025–0.148)
Total Protein, Urine: 19 mg/dL (ref 5–25)

## 2023-02-12 LAB — HEMOGLOBIN A1C
Hgb A1c MFr Bld: 6.4 %{Hb} — ABNORMAL HIGH (ref <5.7)
Mean Plasma Glucose: 137 mg/dL
eAG (mmol/L): 7.6 mmol/L

## 2023-02-21 ENCOUNTER — Telehealth: Payer: Self-pay

## 2023-02-21 NOTE — Telephone Encounter (Signed)
levofloxacin (LEVAQUIN) 500 MG tablet not on current list.

## 2023-02-21 NOTE — Telephone Encounter (Signed)
Prescription Request  02/21/2023  LOV: 02/11/23  What is the name of the medication or equipment? levofloxacin (LEVAQUIN) 500 MG tablet [[id number]]  ENDED  Have you contacted your pharmacy to request a refill? Yes   Which pharmacy would you like this sent to?  [id number] - [place] ([place]), [place] - [address] [place] [address] Phone: [phone] Fax: [phone]    Patient notified that their request is being sent to the clinical staff for review and that they should receive a response within 2 business days.   Please advise at [facility] [phone]

## 2023-03-03 DIAGNOSIS — G4737 Central sleep apnea in conditions classified elsewhere: Secondary | ICD-10-CM | POA: Diagnosis not present

## 2023-03-03 DIAGNOSIS — G4730 Sleep apnea, unspecified: Secondary | ICD-10-CM | POA: Diagnosis not present

## 2023-03-03 DIAGNOSIS — G4731 Primary central sleep apnea: Secondary | ICD-10-CM | POA: Diagnosis not present

## 2023-03-10 ENCOUNTER — Other Ambulatory Visit: Payer: Self-pay | Admitting: Family Medicine

## 2023-03-11 NOTE — Telephone Encounter (Signed)
Requested Prescriptions  Pending Prescriptions Disp Refills   EQ ALLERGY RELIEF, CETIRIZINE, 10 MG tablet [Pharmacy Med Name: EQ Allergy Relief (Cetirizine) 10 MG Oral Tablet] 90 tablet 0    Sig: Take 1 tablet by mouth once daily     Ear, Nose, and Throat:  Antihistamines 2 Failed - 03/10/2023  9:45 AM      Failed - Cr in normal range and within 360 days    Creat  Date Value Ref Range Status  02/11/2023 1.26 (H) 0.70 - 1.22 mg/dL Final   Creatinine, Urine  Date Value Ref Range Status  02/11/2023 137 20 - 320 mg/dL Final         Failed - Valid encounter within last 12 months    Recent Outpatient Visits           1 year ago Prediabetes   [facility] Medicine Donita Brooks, MD   2 years ago Viral upper respiratory tract infection   [facility] Medicine Donita Brooks, MD   2 years ago Need for prophylactic vaccination and inoculation against influenza   [facility] Medicine [name], Priscille Heidelberg, MD   3 years ago Prediabetes   [facility] Tanya Nones, Priscille Heidelberg, MD   3 years ago Dysuria   [facility] Medicine [name], Priscille Heidelberg, MD       Future Appointments             In 1 month Kalman Shan, MD [facility] Pulmonary Care at [facility]

## 2023-03-26 ENCOUNTER — Telehealth: Payer: Self-pay | Admitting: Family Medicine

## 2023-03-26 ENCOUNTER — Telehealth: Payer: Self-pay

## 2023-03-26 NOTE — Telephone Encounter (Signed)
Copied from CRM [id number]. Topic: Appointments - Appointment Scheduling >> Mar 26, 2023 10:09 AM Cassiday T wrote: Patient/patient representative is calling to schedule an appointment. Refer to attachments for appointment information.

## 2023-03-26 NOTE — Telephone Encounter (Signed)
PATIENT IS CALLING IN TO SEE IF HE CAN GET SOME MEDICATION CALLED IN PATIENT IS COMING DOWN WITH A BAD COLD

## 2023-03-27 ENCOUNTER — Other Ambulatory Visit: Payer: Self-pay | Admitting: Family Medicine

## 2023-03-27 MED ORDER — HYDROCODONE BIT-HOMATROP MBR 5-1.5 MG/5ML PO SOLN: 5.0000 mL | Freq: Three times a day (TID) | ORAL | 0 refills | Status: DC | PRN

## 2023-04-02 ENCOUNTER — Encounter: Payer: Self-pay | Admitting: Family Medicine

## 2023-04-02 ENCOUNTER — Ambulatory Visit (INDEPENDENT_AMBULATORY_CARE_PROVIDER_SITE_OTHER): Payer: Medicare Other | Admitting: Family Medicine

## 2023-04-02 VITALS — BP 130/80 | HR 58 | Temp 97.9°F | Ht 66.0 in | Wt 264.0 lb

## 2023-04-02 DIAGNOSIS — J069 Acute upper respiratory infection, unspecified: Secondary | ICD-10-CM | POA: Insufficient documentation

## 2023-04-02 MED ORDER — VENTOLIN HFA 108 (90 BASE) MCG/ACT IN AERS: 2.0000 | INHALATION_SPRAY | Freq: Four times a day (QID) | RESPIRATORY_TRACT | 0 refills | Status: AC | PRN

## 2023-04-02 NOTE — Progress Notes (Signed)
Subjective:  HPI: William Howell is a 82 y.o. male presenting on 04/02/2023 for Acute Visit (cold symptoms and diarrhea)   HPI Patient is in today for a lingering mucopurulent cough for 2 weeks that has overall improved after a recent cough. His cough is mild and is not keeping him up at night. 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 He does have a history of ILD. Shortness of breath is at baseline and he has not had to use his inhaler, however does request a refill for this as his is expired. His wife is also sick with mild cold symptoms. Denies sinus congestion or pressure, rhinorrhea, ear pain, sore throat, fever, body aches, chills.  Review of Systems  All other systems reviewed and are negative.   Relevant past medical history reviewed and updated as indicated.   Past Medical History:  Diagnosis Date   Allergy    Asthma    Diabetes mellitus type II, controlled (HCC)    GERD (gastroesophageal reflux disease)    pt states he takes omeprazole but doesnt have GERD   Hypertension    Obesity    Sleep apnea    uses CPAP     Past Surgical History:  Procedure Laterality Date   CHOLECYSTECTOMY     DENTAL SURGERY      Allergies and medications reviewed and updated.   Current Outpatient Medications:    B Complex Vitamins (B COMPLEX 1 PO), Take by mouth., Disp: , Rfl:    Budeson-Glycopyrrol-Formoterol (BREZTRI AEROSPHERE) 160-9-4.8 MCG/ACT AERO, Inhale 2 puffs into the lungs 2 (two) times daily., Disp: 10.7 g, Rfl: 11   EQ ALLERGY RELIEF, CETIRIZINE, 10 MG tablet, Take 1 tablet by mouth once daily, Disp: 90 tablet, Rfl: 0   glucosamine-chondroitin 500-400 MG tablet, Take 1 tablet by mouth 2 (two) times daily., Disp: , Rfl:    HYDROcodone bit-homatropine (HYCODAN) 5-1.5 MG/5ML syrup, Take 5 mLs by mouth every 8 (eight) hours as needed for cough., Disp: 120 mL, Rfl: 0   losartan (COZAAR) 50 MG tablet, Take 1 tablet (50 mg total) by mouth daily., Disp: 90 tablet, Rfl: 3    metFORMIN (GLUCOPHAGE) 500 MG tablet, Take 1 tablet (500 mg total) by mouth 2 (two) times daily with a meal., Disp: 180 tablet, Rfl: 3   Multiple Vitamins-Minerals (MULTIVITAMIN WITH MINERALS) tablet, Take 1 tablet daily by mouth., Disp: , Rfl:    Nintedanib (OFEV) 100 MG CAPS, Take 1 capsule (100 mg total) by mouth 2 (two) times daily., Disp: 180 capsule, Rfl: 1   omeprazole (PRILOSEC) 40 MG capsule, Take 1 capsule by mouth once daily, Disp: 90 capsule, Rfl: 3   tamsulosin (FLOMAX) 0.4 MG CAPS capsule, Take 1 capsule (0.4 mg total) by mouth daily., Disp: 90 capsule, Rfl: 3   Calcium Carb-Cholecalciferol (CALCIUM 1000 + D PO), Take 1 tablet by mouth 2 (two) times daily. (Patient not taking: Reported on 04/02/2023), Disp: , Rfl:    VENTOLIN HFA 108 (90 Base) MCG/ACT inhaler, Inhale 2 puffs into the lungs every 6 (six) hours as needed for wheezing or shortness of breath., Disp: 18 g, Rfl: 0  No Known Allergies  Objective:   BP 130/80   Pulse (!) 58   Temp 97.9 F (36.6 C) (Oral)   Ht 5\' 6"  (1.676 m)   Wt 264 lb (119.7 kg)   SpO2 98%   BMI 42.61 kg/m      04/02/2023    3:03 PM 02/11/2023   11:11 AM 01/21/2023  1:04 PM  Vitals with BMI  Height 5\' 6"  5' 6.3" 5' 6.3"  Weight 264 lbs 265 lbs 3 oz 259 lbs 6 oz  BMI 42.63 42.42 41.49  Systolic 130 120 601  Diastolic 80 72 83  Pulse 58 60 72     Physical Exam Vitals and nursing note reviewed.  Constitutional:      Appearance: Normal appearance. He is normal weight.  HENT:     Head: Normocephalic and atraumatic.     Right Ear: Tympanic membrane, ear canal and external ear normal.     Left Ear: Tympanic membrane, ear canal and external ear normal.     Nose: Nose normal.     Mouth/Throat:     Mouth: Mucous membranes are moist.     Pharynx: Oropharynx is clear.  Eyes:     Conjunctiva/sclera: Conjunctivae normal.  Cardiovascular:     Rate and Rhythm: Normal rate and regular rhythm.     Pulses: Normal pulses.     Heart sounds:  Normal heart sounds.  Pulmonary:     Effort: Pulmonary effort is normal.     Breath sounds: Decreased air movement present.  Musculoskeletal:     Cervical back: Normal range of motion. No tenderness.  Lymphadenopathy:     Cervical: No cervical adenopathy.  Skin:    General: Skin is warm and dry.     Capillary Refill: Capillary refill takes less than 2 seconds.  Neurological:     General: No focal deficit present.     Mental Status: He is alert and oriented to person, place, and time. Mental status is at baseline.  Psychiatric:        Mood and Affect: Mood normal.        Behavior: Behavior normal.        Thought Content: Thought content normal.        Judgment: Judgment normal.     Assessment & Plan:  Viral URI with cough Assessment & Plan: Reassured patient that symptoms and exam findings are most consistent with a viral upper respiratory infection and explained lack of efficacy of antibiotics against viruses. He is feeling much better today, no signs of pneumonia or bacterial infection, no worsening SOB or wheeze. Discussed expected course and features suggestive of secondary bacterial infection.  Continue supportive care and albuterol PRN. Return to office if symptoms persist or worsen.   Other orders -     Ventolin HFA; Inhale 2 puffs into the lungs every 6 (six) hours as needed for wheezing or shortness of breath.  Dispense: 18 g; Refill: 0     Follow up plan: Return if symptoms worsen or fail to improve.  Park Meo, FNP

## 2023-04-02 NOTE — Assessment & Plan Note (Signed)
Reassured patient that symptoms and exam findings are most consistent with a viral upper respiratory infection and explained lack of efficacy of antibiotics against viruses. He is feeling much better today, no signs of pneumonia or bacterial infection, no worsening SOB or wheeze. Discussed expected course and features suggestive of secondary bacterial infection.  Continue supportive care and albuterol PRN. Return to office if symptoms persist or worsen.

## 2023-04-21 ENCOUNTER — Encounter: Payer: Self-pay | Admitting: Internal Medicine

## 2023-04-21 ENCOUNTER — Telehealth: Payer: Medicare Other | Admitting: Internal Medicine

## 2023-04-21 ENCOUNTER — Telehealth: Payer: Self-pay | Admitting: Internal Medicine

## 2023-04-21 DIAGNOSIS — J849 Interstitial pulmonary disease, unspecified: Secondary | ICD-10-CM | POA: Diagnosis not present

## 2023-04-21 DIAGNOSIS — Z5181 Encounter for therapeutic drug level monitoring: Secondary | ICD-10-CM | POA: Diagnosis not present

## 2023-04-21 DIAGNOSIS — R110 Nausea: Secondary | ICD-10-CM

## 2023-04-21 DIAGNOSIS — D721 Eosinophilia, unspecified: Secondary | ICD-10-CM | POA: Diagnosis not present

## 2023-04-21 DIAGNOSIS — J45991 Cough variant asthma: Secondary | ICD-10-CM

## 2023-04-21 MED ORDER — ONDANSETRON HCL 4 MG PO TABS: 4.0000 mg | ORAL_TABLET | Freq: Two times a day (BID) | ORAL | 3 refills | Status: AC | PRN

## 2023-04-21 NOTE — Progress Notes (Addendum)
 OV 05/16/2022 -transfer of care to the ILD center with Dr. Geronimo.    Subjective:  Patient ID: William Howell, male , DOB: [birth year] , age 83 y.o. , MRN: [id number] , ADDRESS: [address] [place] [address] PCP William Butler DASEN, MD Patient Care Team: William Butler DASEN, MD as PCP - General (Family Medicine) William Vinie BROCKS, MD as PCP - Cardiology (Cardiology) William Vinie BROCKS, MD as Consulting Physician (Cardiology)  This Provider for this visit: Treatment Team:  Attending Provider: Geronimo Amel, MD    05/16/2022 -   Chief Complaint  Patient presents with   Consult    Cough,      HPI William Howell 83 y.o. -is maternal grandfather to William Howell pulmonologist at [facility].  He is transferring his care to Dr. Geronimo [me] because of concern of interstitial lung disease.  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  He does a lot of heavy manual work.  He has significant exposures that because of his farm work.  He has also obesity and has sleep apnea.  He uses CPAP at night previously followed by Dr. MALVA (currently unclear who his new sleep doctor will be].  He reports dyspnea on exertion for at least the last 10-12 years but for the last 1 year it is progressive.  It is relieved by rest.  However since October 2023 has had a cough it initially got better but then November 2023 it came back again.  He recollects being treated for pneumonia.  Around this time in December 2023 a few days before Christmas he did have a CT scan of the chest that I personally visualized it does show right lower lobe density consistent with pneumonia].  Since then after treatment of pneumonia his cough is better but is still not back at baseline.  His shortness of breath is also not back at baseline.  Regarding his cough overall it was worse compared to a year ago although better compared to fall 2023.  He does bring out some  occasional clear to light yellow phlegm.  He does cough at night.  He does also have wheezing with the cough he does clear the throat but there is no hemoptysis.  The cough does not get worse when he lies down.  There is no tickle in his throat.  [facility] Comprehensive ILD Questionnaire  Symptoms:   Past Medical History :  -Obesity -Sleep apnea on CPAP -Review of the external records indicate that in 2019 he was being treated for asthma by Dr. Vicenta Lennert. -Denies any connective tissue disease -Has hypertension Pneumonia right lower lobe December 2023 -He has had both COVID-vaccine and COVID disease but was never hospitalized for COVID.   ROS:  --Fatigue Shortness of breath Chronic cough Some weight loss Does not snore because of CPAP   FAMILY HISTORY of LUNG DISEASE:  -No family history of pulmonary fibrosis but his father did have COPD  PERSONAL EXPOSURE HISTORY:  -Non-smoker.  No marijuana use.  No cocaine use no intravenous drug use  HOME  EXPOSURE and HOBBY DETAILS :  -Lives and is fine to take a farm house single[place]family [place].  The home is 83 years old.  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  Since 1970s has been active farming.  He has been exposed to working in [employer].  Is been a daily farmer a lot of chemical exposure.  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  He is also done sewage work.  None animal feed production.  Been a consideration is.  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  Has done foundry work, microbiologist, management consultant, location manager, woodwork presents on 5  working.  Has been exposed to pesticides.  PULMONARY TOXICITY HISTORY (27 items):  -He has been on and off prednisone  for years but otherwise detailed pulmonary drug toxicity is negative  INVESTIGATIONS: -Last pulmonary function test in 2017 with reduced FVC but normal DLCO.  This is consistent with obesity. -CT chest without contrast December 2023:  -He does have right lower lobe pneumonia  -He does have ILD that I suspect is in a pattern not consistent with UIP/alternative to UIP pattern  -Onset: In 2017 CT scan of the chest he did not seem to have it v GGO/Air trapping  However 2022 he has some ILD changes. -   CT Chest data - CTWO contrast 04/04/22  Narrative & Impression  CLINICAL DATA:  Follow-up lung nodule seen on radiographs   EXAM: CT CHEST WITHOUT CONTRAST   TECHNIQUE: Multidetector CT imaging of the chest was performed following the standard protocol without IV contrast.   RADIATION DOSE REDUCTION: This exam was performed according to the departmental dose-optimization program which includes automated exposure control, adjustment of the mA and/or kV according to patient size and/or use of iterative reconstruction technique.   COMPARISON:  Radiographs 03/19/2022 and CTA chest 09/28/2021   FINDINGS: Cardiovascular: Normal heart size. No pericardial effusion. Aortic atherosclerotic calcification. The ascending aorta measures approximately 44 mm in diameter (coronal image 86) this previously measured 41 mm on CTA 09/28/2021 though direct comparison is limited by lack of IV contrast.   Mediastinum/Nodes: Increased size of multiple mediastinal lymph nodes. For example a right paratracheal node measures 1.0 cm (3/33), previously 0.6 cm. Unremarkable esophagus.   Lungs/Pleura: Patchy reticulonodular opacities and interstitial thickening are slightly progressed from 09/28/2021. Increased bronchial wall thickening and bronchiectasis/bronchiolectasis greatest in the  right lower lobe. Focal atelectasis/consolidation in the posterior right lower lobe. No pleural effusion or pneumothorax. No CT correlate for nodule seen on radiographs 03/19/2022.   Upper Abdomen: Cholecystectomy. Colonic diverticulosis without diverticulitis. No acute abnormality   Musculoskeletal: No chest wall mass or suspicious bone lesions identified.   IMPRESSION: 1. Pulmonary findings compatible with bronchitis/bronchiolitis with right lower lobe pneumonia. 2. Similar to slight progression of interstitial lung disease compared with 09/28/2021. 3. Ascending aortic aneurysm measures 44 mm in diameter on today's exam. This may be slightly increased from 09/28/2021 however comparison is difficult given differences in obliquity and lack of IV contrast. Continued annual imaging follow-up with CTA or MRA is recommended.     Electronically Signed   By: Norman Gatlin M.D.   On: 04/08/2022 00:02     OV 06/28/2022  Subjective:  Patient ID: William Howell, male , DOB: [birth year] , age 41 y.o. , MRN: [id number] , ADDRESS: [address] [place] [address] PCP William Butler DASEN, MD Patient Care Team: William Butler DASEN, MD as PCP - General (Family Medicine) William Vinie BROCKS, MD as PCP -  Cardiology (Cardiology) William Vinie BROCKS, MD as Consulting Physician (Cardiology)  This Provider for this visit: Treatment Team:  Attending Provider: Geronimo Amel, MD  ILD workup in progress  06/28/2022 -   Chief Complaint  Patient presents with   Follow-up    COVID 05/28/2022.  Cough with yellow mucus.  SOB with exertion.   ILD workup in pgoress.  HPI William Howell 83 y.o. -here with wife to review CT, PFT and serology. Says In interim had dental extraction and after that has had dimnished PO and lost 27#. Now a week ago started Ozempoc. HE is hoping to lose more weight but is already having some diarrhea./ Also mid feb 2024 had a bout of covid that was mild though is  having some small amout of cough with yellow sputum .  His PFTs show mild decline in FVC and DLCO in 7 year. . Visualization of images - I Agree is features are NOT cw UIP, Agree is more c/w HP or NSIP. Agree is mild. However, I am not sure of radiologic progression.   We decissused that biopsy is indicated but I did recommend that risk for SLB is > benefit. Did indicate at some point we can consider bronch biopy but at this point was to sort out if there is progerssion on CT . PFTs ae suggesting progression. On radiology repot (Dec 2023 suggested progression) but the current 2024 reporrt is equivocal. If there is progression, would recommend anti-fibrotic but did indicate to him tht markers of ILD for him are indicative of mild burden and likely only very slow progression. 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 Exposed to chemicals, mulch, fertilizer. First HRCT 2017, Some other CTs in 2022 and 2023. He has dyspnea on exertion x 12  years but progressive x 1 year.  End of 2023 got Rx for PNA>  but this could be all obesity. His PFTs 2017 -> 2023 show decline 2.86L/70% -> 2.14L/55%. DLCO: has gone from 26.9 -> 19.9 in same period but both listed as normal > 80%. Need to figure out if he should go an anti-fibrotic so need to see CT and see if he has progressive phenotype on ILD because symptoms could be from obesity  and also because overall burden of ILD is mild.   - Date or time period of scan:  HRCT: 06/12/2022 HRCT: 01/15/2016 - RAST allergy  neg 2017  - Discussion synopsis:  Feb 2024: ILD + , Upper lobe pednomnance, Primary feature is  ground glass, There is volume loss and archietctural distorion in Upper Lobe.   - What is the final conclusion per 2018 ATS/Fleischner Criteria - Alternative Diagnosis. Dx #1 is Chronic HP.  and worse since 2017 slowly. Also, in current FEb 2024 there is some acue process with nodularity - cocerconsuggestive of acute atypical process such as MAC   - Concordance with official report: DISCORDANT  Pathology discussion of biopsy: No lung biopsy   MDD Impression/Recs:   - Rx as HP - steroid can be problematic. Rx as progressive phentopye with anti-fibrotic. Also do bronc with BAL but no need for  TBbx      OV 08/20/2022  Subjective:  Patient ID: William Howell, male , DOB: 1940-09-26 , age 75 y.o. , MRN: [id number] , ADDRESS: [address] [place] [address] PCP William Butler DASEN, MD Patient Care Team: William Butler DASEN, MD as PCP - General (Family Medicine) William Vinie BROCKS, MD as PCP - Cardiology (Cardiology) William Vinie BROCKS, MD as Consulting Physician (Cardiology)  This Provider for this visit: Treatment Team:  Attending Provider: Geronimo Amel, MD    08/20/2022 -   Chief Complaint  Patient presents with   Follow-up    F/up  HPI William Howell 83 y.o. -returns for follow-up.  He says after the last visit and treating with antibiotic and prednisone  his cough did improve but is still there.  He does not wake up because of the cough at night but it does wake his wife up.  He says it is a little bit better but he does still have yellow phlegm.  Review of the labs indicate in 2017 RAST allergy  panel was normal.  His blood eosinophils are always elevated.  His exam nitric oxide  is slightly up.  His MAR shows that he is supposed to be taking Symbicort  but he does not recollect taking it.  MAR also shows him to be taking zafirlukast  but he is not sure if he is taking it.  I asked him to restart his Symbicort .  Regarding his ILD: We discussed with the case conference and the  consensus was that the overall disease burden is mild but it is definitely progressive over many years.  The thought process is that he has hypersensitive pneumonitis.  Steroids was deemed risk particularly over the long-term because of his multiple comorbidities, obesity and his age.  Surgical lung biopsy also would be risky.  Bronchoscopy could be considered an antifibrotic could be considered.  Last visit I thought you should go with pirfenidone because he was on Ozempic  and he was having some diarrhea.  However now he is off Ozempic  because of the side effects.  He did lose weight.  He feels he is gaining weight without the Ozempic .  He will talk to his primary care about other drugs.  We discussed the consensus in the conference that antifibrotic should be indicated for him even though the disease burden is very mild because it is progressive slowly.  We discussed pirfenidone versus nintedanib.  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  We did talk about the diarrhea risk and also extremely high cardiac risk.  Overall shared decision making to take nintedanib.  Decided to start with a low-dose protocol and then slowly escalate.  Also discussed avoidance measures for mold.  He states he is to be exposed heavily to hay and mold and chemicals and fertilizer.  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  I advised him to be diligent about avoiding these exposures.  He is in agreement.   09/30/2022- Interim hx  Patient presents today for 6 week follow-up/new OFEV  start. He was started on low dose Nintedanib 100mg  twice daily in early May. He has progressive ILD, symptom burden is mild. 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   Breathing is baseline, no changes. He has no acute complaints today. He has not been coughing like he was. FENO was elevated at previous visit indicating allergic component contributing to cough. He has used Symbicort  a couple of times  but does  not require daily.     OV 11/25/2022  Subjective:  Patient ID: William Howell, male , DOB: May 17, 1940 , age 13 y.o. , MRN: [id number] , ADDRESS: [address] [place] [address] PCP William Butler DASEN, MD Patient Care Team: William Butler DASEN, MD as PCP - General (Family Medicine) William Vinie BROCKS, MD as PCP - Cardiology (Cardiology) William Vinie BROCKS, MD as Consulting Physician (Cardiology)  This Provider for this visit: Treatment Team:  Attending Provider: Geronimo Amel, MD   11/25/2022 -   Chief Complaint  Patient presents with   Follow-up    F/up on PFT, BP high since starting ofev , diarrhea, and weight gain more the 2 lbs.      HPI William Howell 83 y.o. -presents for follow-up.  He is here with his wife who is an independent historian.  He had to go to the beach and so he delayed starting his nintedanib.  Has been taking it for 2 weeks now on the low-dose protocol 100 mg twice daily.  He had diarrhea initially but this resolved.  He is concerned that his unintended might be increasing his blood pressure.  Last night was 170 systolic.  This morning after he took his losartan  and after the morning dose nintedanib here in our office it is normal.  However he does not check his night blood pressure on a consistent basis.  He checked it randomly.  We do not know if prenintedanib his night blood pressure was going up because he never checked it.  He also uses a wrist cuff monitor.  Therefore I shared with him the some uncertainty whether his nintedanib is really causing hypertension although I did admit that nintedanib can increase the blood pressure but it is an uncommon side effect.  Currently is not having any cough.  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  He quit taking his Symbicort  for no particular reason.  I recommended he restart it.  And also use a mask with his farming work.  He had pulmonary function test today and is stable.  Symptom scores are  stable.      OV 01/21/2023  Subjective:  Patient ID: William Howell, male , DOB: 11/20/1940 , age 72 y.o. , MRN: [id number] , ADDRESS: [address] [place] [address] PCP William Butler DASEN, MD Patient Care Team: William Butler DASEN, MD as PCP - General (Family Medicine) William Vinie BROCKS, MD as PCP - Cardiology (Cardiology) William Vinie BROCKS, MD as Consulting Physician (Cardiology)  This Provider for this visit: Treatment Team:  Attending Provider: Geronimo Amel, MD    01/21/2023 -   Chief Complaint  Patient presents with   Follow-up    Pt states he has not been well the last few days, cough, wheezing. Pt has not been using his albuterol  inhaler or symbicort .    -Hypersensitivity progressive phenotype  -Wheezing phenotype not taking his Symbicort .  High blood eosinophils but normal RAST panel  - Nintedanib/Ofev  requires intensive drug monitoring due to high concerns for Adverse effects of , including  Drug Induced Liver Injury, significant GI side effects that include but not limited to Diarrhea, Nausea, Vomiting,  and other system side effects that include Fatigue,  weight loss. Cardiac side effects are a black box warning as well. These will be monitored with  blood work such as LFT initially once a month for 6 months and then quarterly   HPI William Howell 82 y.o. -follow-up hypersensitive pneumonitis with asthma  phenotype overlap.  He is here with his wife she is an independent historian.  He continues on nintedanib but he is having significant side effects.  He says initially they came on but resolved but for the last few weeks the back.  For the last few days he is only taking 100 mg once daily.  The symptoms are decreased appetite decreased p.o. intake decreased taste increased diarrhea and also flatulence.  He did pulmonary function test today and is stable through the course of this year.  In addition for the last 5 days is reporting increased cough increase  sputum.  Change in color of sputum to yellow.  He is increased his farming activities for the fall.  He does unique technque Education Administrator crop planting.  This increases the nitrogen content of the soil.  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  He says he is using a cover tractor.  There are no sick contacts.  The weather is also changes no fall in this range.  But it is unclear why he is having the symptoms.  He is got active wheezing today wife is really concerned about this he is also concerned about this.  He is not taking his Symbicort .  This because it was expensive.  Will try to get another inhaler for him.  He does have some samples of Breztri .    OV 04/21/2023  Subjective:  Patient ID: William Howell, male , DOB: May 11, 1940 , age 40 y.o. , MRN: [id number] , ADDRESS: [address] [place] [address] PCP William Butler DASEN, MD Patient Care Team: William Butler DASEN, MD as PCP - General (Family Medicine) William Vinie BROCKS, MD as PCP - Cardiology (Cardiology) William Vinie BROCKS, MD as Consulting Physician (Cardiology)  This Provider for this visit: Treatment Team:  Attending Provider: Geronimo Amel, MD    ILD workup in progress: Post multidisciplinary case conference diagnosis hypersensitive pneumonitis from farming  -Disease burden is mild  Also chronic cough  -Normal RAST allergy  testing 2017  -Elevated nitric oxide  May 2024  - Elevated eosinophils - 500 cells in feb 2024, 600 cells Oct 2024    Morbid obesity  -Previously on Ozempic  and stopped because of side effects; early 2024  04/21/2023 -   Chief Complaint  Patient presents with   Follow-up    Getting over a cold. He states today his breathing stable.    Type of visit: Video Virtual Visit Identification of patient William Howell with [birth year] and MRN [id number] - 2 person identifier Risks: Risks, benefits, limitations of telephone visit explained. Patient understood and verbalized  agreement to proceed Anyone else on call: just wife Patient location: his home This provider location: [address], [address]; [place]; [place] [address]. [facility]. [phone]   HPI William Howell 83 y.o. -at last visit he was in a wheezing exacerbation.  Z-Pak and prednisone  was called in.  We checked his blood eosinophils and it was high.  Therefore I recommended Dupixent or another biologic.  However, I do not see any evidence of further follow-up.  He did get some Levaquin  in mid December for 2024 through primary care physician based on chart review.  He also had a cough at that time and was given Hycodan cough syrup.  His blood eosinophils have been quite high between 500 and 600 cells per cubic millimeter in the year 2024 as recently as October 2024.  Currently is doing  well except that he is having nausea with nintedanib.  He tells me that after some in October 2024 he gave her 2-week holiday with the nintedanib and then restarted the low-dose 100 mg twice daily but he is only taking it at once daily.  Even with this is some mild nausea for 30 minutes and he is having some mild diarrhea.  He is willing to rechallenge himself to twice daily.  We talked about taking Zofran  and he is agreeable.  I sent in the prescription.  Also talked about his high eosinophil load and recommended biologic.  We have settled on Fasenra  because of the schedule.  Have sent a message to our pharmacy team.  His wife is also part of the conversation.  Although she was not an independent historian today.  He has upcoming CT chest in February but no office visit.   SYMPTOM SCALE - ILD 05/16/2022 06/28/2022   08/20/2022 272# 09/30/2022  11/25/2022  01/21/2023 ofev   Current weight           O2 use ra ra ra RA  ra ra  Shortness of Breath 0 -> 5 scale with 5 being worst (score 6 If unable to do)       2  At rest 1 1 3  0 2 2  Simple tasks - showers, clothes change, eating, shaving 2 2 3 3 3 2    Household (dishes, doing bed, laundry) 4 3 3 3 4 3   Shopping 3 3 4 3 3 3   Walking level at own pace 4 3 4 3 3 3   Walking up Stairs 4 3 4 4 4 4   Total (30-36) Dyspnea Score 11 15 21 16 19 17      Non-dyspnea symptoms (0-> 5 scale) 05/16/2022 06/28/2022  08/20/2022  11/25/2022  01/21/2023   How bad is your cough? 3 2 3 2 3   How bad is your fatigue 4 3 - Rx predn/abx 3 3 2   How bad is nausea 0 0 0 2 0  How bad is vomiting?  0 0 0 0 0  How bad is diarrhea? 0 0 - mild after ozempic  start a wekk aog 0 1 3  How bad is anxiety? 1 0 0  0  How bad is depression 1 0 0  0  Any chronic pain - if so where and how bad 1 0 0       Simple office walk 224 (66+46 x 2) feet Pod A at [facility] x  3 laps goal with forehead probe 08/20/2022    O2 used ra   Number laps completed 15 x sit stand   Comments about pace good   Resting Pulse Ox/HR 97% and 79/min   Final Pulse Ox/HR 98% and 94/min   Desaturated </= 88% no   Desaturated <= 3% points no   Got Tachycardic >/= 90/min yes   Symptoms at end of test Level 4 dyspnea of 10   Miscellaneous comments x      PFT     Latest Ref Rng & Units 01/21/2023   11:30 AM 11/20/2022    8:57 AM 06/28/2022    9:00 AM 12/12/2015   10:53 AM  ILD indicators  FVC-Pre L 2.32  2.40  2.14  2.86   FVC-Predicted Pre % 71  63  55  70   FVC-Post L   2.20    FVC-Predicted Post %   57    TLC L   4.38  5.07  TLC Predicted %   63  74   DLCO uncorrected ml/min/mmHg 20.39  22.11  19.66  26.92   DLCO UNC %Pred % 96  94  83  86   DLCO Corrected ml/min/mmHg 20.39  22.11  20.45    DLCO COR %Pred % 96  94  86        LAB RESULTS last 96 hours No results found.  LAB RESULTS last 90 days Recent Results (from the past 2160 hours)  Hemoglobin A1c     Status: Abnormal   Collection Time: 02/11/23 12:01 PM  Result Value Ref Range   Hgb A1c MFr Bld 6.4 (H) <5.7 % of total Hgb    Comment: For someone without known diabetes, a hemoglobin  A1c value between 5.7% and 6.4% is  consistent with prediabetes and should be confirmed with a  follow-up test. . For someone with known diabetes, a value <7% indicates that their diabetes is well controlled. A1c targets should be individualized based on duration of diabetes, age, comorbid conditions, and other considerations. . This assay result is consistent with an increased risk of diabetes. . Currently, no consensus exists regarding use of hemoglobin A1c for diagnosis of diabetes for children. .    Mean Plasma Glucose 137 mg/dL   eAG (mmol/L) 7.6 mmol/L  BASIC METABOLIC PANEL WITH GFR     Status: Abnormal   Collection Time: 02/11/23 12:01 PM  Result Value Ref Range   Glucose, Bld 86 65 - 99 mg/dL    Comment: .            Fasting reference interval .    BUN 22 7 - 25 mg/dL   Creat 8.73 (H) 9.29 - 1.22 mg/dL   eGFR 57 (L) > OR = 60 mL/min/1.58m2   BUN/Creatinine Ratio 17 6 - 22 (calc)   Sodium 142 135 - 146 mmol/L   Potassium 5.1 3.5 - 5.3 mmol/L   Chloride 102 98 - 110 mmol/L   CO2 30 20 - 32 mmol/L   Calcium 9.8 8.6 - 10.3 mg/dL  Protein / Creatinine Ratio, Urine     Status: None   Collection Time: 02/11/23 12:01 PM  Result Value Ref Range   Creatinine, Urine 137 20 - 320 mg/dL   Protein/Creat Ratio 860 25 - 148 mg/g creat   Protein/Creatinine Ratio 0.139 0.025 - 0.148 mg/mg creat   Total Protein, Urine 19 5 - 25 mg/dL         has a past medical history of Allergy , Asthma, Diabetes mellitus type II, controlled (HCC), GERD (gastroesophageal reflux disease), Hypertension, Obesity, and Sleep apnea.   reports that he has never smoked. He has quit using smokeless tobacco.  His smokeless tobacco use included chew.  Past Surgical History:  Procedure Laterality Date   CHOLECYSTECTOMY     DENTAL SURGERY      No Known Allergies  Immunization History  Administered Date(s) Administered   Fluad Quad(high Dose 65+) 01/04/2019, 03/27/2020, 04/22/2022   Fluad Trivalent(High Dose 65+) 02/11/2023    Influenza, High Dose Seasonal PF 02/17/2017   Influenza,inj,Quad PF,6+ Mos 02/10/2014, 03/14/2015, 01/09/2016, 02/12/2018   Influenza-Unspecified 02/12/2018   PFIZER(Purple Top)SARS-COV-2 Vaccination 07/10/2019, 08/04/2019   Pneumococcal Conjugate-13 07/04/2014   Pneumococcal Polysaccharide-23 12/26/2011   Td 12/26/2011   Tdap 12/26/2011, 08/15/2022   Zoster Recombinant(Shingrix) 02/20/2021, 04/18/2021   Zoster, Live 02/10/2014    Family History  Problem Relation Age of Onset   Heart disease Father    Heart attack Father  Heart attack Sister    Colon cancer Neg Hx    Colon polyps Neg Hx    Esophageal cancer Neg Hx    Rectal cancer Neg Hx    Stomach cancer Neg Hx    Pancreatic cancer Neg Hx    Liver cancer Neg Hx    Prostate cancer Neg Hx      Current Outpatient Medications:    Budeson-Glycopyrrol-Formoterol  (BREZTRI  AEROSPHERE) 160-9-4.8 MCG/ACT AERO, Inhale 2 puffs into the lungs 2 (two) times daily., Disp: 10.7 g, Rfl: 11   EQ ALLERGY  RELIEF, CETIRIZINE , 10 MG tablet, Take 1 tablet by mouth once daily, Disp: 90 tablet, Rfl: 0   losartan  (COZAAR ) 50 MG tablet, Take 1 tablet (50 mg total) by mouth daily., Disp: 90 tablet, Rfl: 3   metFORMIN  (GLUCOPHAGE ) 500 MG tablet, Take 1 tablet (500 mg total) by mouth 2 (two) times daily with a meal., Disp: 180 tablet, Rfl: 3   Multiple Vitamins-Minerals (MULTIVITAMIN WITH MINERALS) tablet, Take 1 tablet daily by mouth., Disp: , Rfl:    Nintedanib (OFEV ) 100 MG CAPS, Take 1 capsule (100 mg total) by mouth 2 (two) times daily. (Patient taking differently: Take 100 mg by mouth daily.), Disp: 180 capsule, Rfl: 1   omeprazole  (PRILOSEC) 40 MG capsule, Take 1 capsule by mouth once daily, Disp: 90 capsule, Rfl: 3   ondansetron  (ZOFRAN ) 4 MG tablet, Take 1 tablet (4 mg total) by mouth 2 (two) times daily as needed for nausea or vomiting., Disp: 60 tablet, Rfl: 3   tamsulosin  (FLOMAX ) 0.4 MG CAPS capsule, Take 1 capsule (0.4 mg total) by mouth  daily., Disp: 90 capsule, Rfl: 3   VENTOLIN  HFA 108 (90 Base) MCG/ACT inhaler, Inhale 2 puffs into the lungs every 6 (six) hours as needed for wheezing or shortness of breath., Disp: 18 g, Rfl: 0      Objective:   There were no vitals filed for this visit.  Estimated body mass index is 42.61 kg/m as calculated from the following:   Height as of 04/02/23: 5' 6 (1.676 m).   Weight as of 04/02/23: 264 lb (119.7 kg).  @WEIGHTCHANGE @  There were no vitals filed for this visit.   Physical Exam   General: No distress. Looked well O2 at rest: no Cane present: no Sitting in wheel chair: no Frail: no Obese: yes Neuro: Alert and Oriented x 3. GCS 15. Speech normal Psych: Pleasant      Assessment:       ICD-10-CM   1. ILD (interstitial lung disease) (HCC)  J84.9     2. Therapeutic drug monitoring  Z51.81     3. Cough variant asthma  J45.991     4. Eosinophilia, unspecified type  D72.10     5. 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 # Therapeutic drug monitoring # Nausea and diarrhea due to drug   -Pulmonary function test is stable at this visit [facility] 2024 compared to  01/21/2023 -Symptoms are also stable - nintedanib causing LOT of sid effects and this was improved with nintedanib holiday in October 2024 -Currently on nintedanib 100 mg once daily with some mild nausea and mild diarrhea -  Plan -Start Zofran  4 mg twice daily as needed but 30 minutes before taking nintedanib -Increase nintedanib to 100 mg twice daily and see if he can tolerated especially with Zofran   - -  if this does not work can ask PCP to stop metformin  and see -Keep high-resolution CT chest appointment May 22, 2023 -30-minute visit with Dr. 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    SIGNATURE    Dr. Dorethia William, M.D., F.C.C.P,  Pulmonary and Critical Care Medicine Staff Physician, [facility] Director - Interstitial Lung Disease  Program  Pulmonary Fibrosis [facility] at [facility] [place], [place], [address]  Pager: [phone], If no answer or between  15:00h - 7:00h: call [phone] Telephone: [phone]  1:55 PM 04/21/2023

## 2023-04-21 NOTE — Patient Instructions (Addendum)
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 # Therapeutic drug monitoring # Nausea and diarrhea due to drug   -Pulmonary function test is stable at this visit [facility] 2024 compared to  01/21/2023 -Symptoms are also stable - nintedanib causing LOT of sid effects and this was improved with nintedanib holiday in October 2024 -Currently on nintedanib 100 mg once daily with some mild nausea and mild diarrhea -  Plan -Start Zofran  4 mg twice daily as needed but 30 minutes before taking nintedanib -Increase nintedanib to 100 mg twice daily and see if he can tolerated especially with Zofran   - - if this does not work can ask PCP to stop metformin  and see -Keep high-resolution CT chest appointment May 22, 2023 -30-minute visit with Dr. 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

## 2023-04-21 NOTE — Telephone Encounter (Signed)
    [name] - [name]/Leslie - new Fasenra start. Dr Lianne Moris grandfather

## 2023-04-23 ENCOUNTER — Telehealth: Payer: Self-pay | Admitting: Pharmacist

## 2023-04-23 NOTE — Telephone Encounter (Signed)
 We will start Fasenra benefits investigation  Chesley Mires, PharmD, MPH, BCPS, CPP Clinical Pharmacist (Rheumatology and Pulmonology)

## 2023-04-23 NOTE — Telephone Encounter (Signed)
 Received notice from Dr. Marchelle Gearing via telephone note that patient will be [facility] new start  Submitted a Prior Authorization request to [facility] for [facility] via CoverMyMeds. Will update once we receive a response.  Key: [id number]

## 2023-04-25 ENCOUNTER — Other Ambulatory Visit (HOSPITAL_COMMUNITY): Payer: Self-pay

## 2023-04-25 NOTE — Telephone Encounter (Signed)
 Received notification from [facility] regarding a prior authorization for FASENRA . Authorization has been APPROVED from 04/23/23 to 04/22/24. Approval letter sent to scan center.  Per test claim, copay for 56 days supply is $1,391.18. Patient likely would benefit from the [facility] Payment Plan.

## 2023-05-12 NOTE — Telephone Encounter (Signed)
Called patient regarding Harrington Challenger approval. Left VM advising that patient assistance application will be mailed to his home. Advised him to complete and return to clinic ATTN pharmacy team  Provider form placed in Dr.Ramaswamy's mailbox for signature  Chesley Mires, PharmD, MPH, BCPS, CPP Clinical Pharmacist (Rheumatology and Pulmonology)

## 2023-05-17 ENCOUNTER — Other Ambulatory Visit: Payer: Self-pay | Admitting: Family Medicine

## 2023-05-19 ENCOUNTER — Telehealth: Payer: Self-pay | Admitting: Family Medicine

## 2023-05-19 NOTE — Telephone Encounter (Signed)
Requested Prescriptions  Pending Prescriptions Disp Refills   omeprazole (PRILOSEC) 40 MG capsule [Pharmacy Med Name: Omeprazole 40 MG Oral Capsule Delayed Release] 90 capsule 0    Sig: Take 1 capsule by mouth once daily     Gastroenterology: Proton Pump Inhibitors Failed - 05/19/2023 10:50 AM      Failed - Valid encounter within last 12 months    Recent Outpatient Visits           2 years ago Prediabetes   [facility] Donita Brooks, MD   2 years ago Viral upper respiratory tract infection   [facility] Donita Brooks, MD   3 years ago Need for prophylactic vaccination and inoculation against influenza   [facility] [name], Priscille Heidelberg, MD   3 years ago Prediabetes   [facility] Tanya Nones, Priscille Heidelberg, MD   3 years ago Dysuria   [facility] [name], Priscille Heidelberg, MD

## 2023-05-22 ENCOUNTER — Ambulatory Visit
Admission: RE | Admit: 2023-05-22 | Discharge: 2023-05-22 | Disposition: A | Discharge status: Home / Self Care | Payer: Medicare Other | Source: Ambulatory Visit | Attending: Internal Medicine | Admitting: Internal Medicine

## 2023-05-22 DIAGNOSIS — I7121 Aneurysm of the ascending aorta, without rupture: Secondary | ICD-10-CM | POA: Diagnosis not present

## 2023-05-22 DIAGNOSIS — I700 Atherosclerosis of aorta: Secondary | ICD-10-CM | POA: Diagnosis not present

## 2023-05-22 DIAGNOSIS — J849 Interstitial pulmonary disease, unspecified: Secondary | ICD-10-CM

## 2023-05-22 DIAGNOSIS — N200 Calculus of kidney: Secondary | ICD-10-CM | POA: Diagnosis not present

## 2023-05-23 ENCOUNTER — Telehealth: Payer: Self-pay | Admitting: Internal Medicine

## 2023-05-23 NOTE — Telephone Encounter (Signed)
 Rec'd a [employer] [place] application. I put it in the RX box for processing.NFN

## 2023-05-29 NOTE — Telephone Encounter (Signed)
Received signed patient form for Fasenra PAP. Submission is pending MD form to be returned by Dr. Marchelle Gearing. Placed in PAP pending info folder in pharmacy office  Chesley Mires, PharmD, MPH, BCPS, CPP Clinical Pharmacist (Rheumatology and Pulmonology)

## 2023-06-02 NOTE — Telephone Encounter (Signed)
Received signed provider form  Submitted Patient Assistance Application to [employer] for [facility] along with provider portion, patient portion, PA, medication list, insurance card copy. Will update patient when we receive a response.  Phone #: [phone] Fax #: [phone]

## 2023-06-03 NOTE — Telephone Encounter (Signed)
Received a fax from  [employer] regarding an approval for [facility] patient assistance from 06/03/2023 to 04/14/2024. Approval letter sent to scan center.  Phone #: [phone] Fax #: [phone]  Patient scheduled for Harrington Challenger new start on 06/11/2023  Chesley Mires, PharmD, MPH, BCPS, CPP Clinical Pharmacist (Rheumatology and Pulmonology)

## 2023-06-05 ENCOUNTER — Other Ambulatory Visit: Payer: Self-pay | Admitting: Family Medicine

## 2023-06-11 ENCOUNTER — Ambulatory Visit: Payer: Medicare Other | Admitting: Pharmacist

## 2023-06-11 DIAGNOSIS — J45991 Cough variant asthma: Secondary | ICD-10-CM

## 2023-06-11 DIAGNOSIS — D721 Eosinophilia, unspecified: Secondary | ICD-10-CM

## 2023-06-11 DIAGNOSIS — Z7189 Other specified counseling: Secondary | ICD-10-CM

## 2023-06-11 MED ORDER — FASENRA PEN 30 MG/ML ~~LOC~~ SOAJ: SUBCUTANEOUS | 1 refills | Status: DC

## 2023-06-11 NOTE — Telephone Encounter (Signed)
 MESSAGE FROM [employer]:  Information regarding your request Prior Authorization Not Required

## 2023-06-11 NOTE — Patient Instructions (Signed)
 Your next Harrington Challenger dose is due on 07/09/2023, then 08/06/2023 then every 8 weeks thereafter (starting on 10/01/2023)  CONTINUE Breztri 160-9-4.20mcg (2 puffs twice daily)  Your prescription will be shipped from the [employer] program. Their phone number is [phone] Please call to schedule shipment and confirm address. They will mail your medication to your home.  You will need to be seen by your provider in 3 to 4 months to assess how Dupixent is working for you. Please ensure you have a follow-up appointment scheduled in June or July 2025. Call our clinic if you need to make this appointment.  Stay up to date on all routine vaccines: influenza, pneumonia, COVID19, Shingles  How to manage an injection site reaction: Remember the 5 C's: COUNTER - leave on the counter at least 30 minutes but up to overnight to bring medication to room temperature. This may help prevent stinging COLD - place something cold (like an ice gel pack or cold water bottle) on the injection site just before cleansing with alcohol. This may help reduce pain CLARITIN - use Claritin (generic name is loratadine) for the first two weeks of treatment or the day of, the day before, and the day after injecting. This will help to minimize injection site reactions CORTISONE CREAM - apply if injection site is irritated and itching CALL ME - if injection site reaction is bigger than the size of your fist, looks infected, blisters, or if you develop hives

## 2023-06-11 NOTE — Progress Notes (Unsigned)
 HPI Patient presents today to [facility] to see pharmacy team for [facility] new start. He is accompanied by wife Norberta Keens. Past medical history includes ***  Number of hospitalizations in past year: Number of ***COPD/asthma exacerbations in past year:   Respiratory Medications Current regimen: *** Tried in past: *** Patient reports {Adherence challenges yes no:[id number]::"adherence challenges","no known adherence challenges"}  OBJECTIVE No Known Allergies  Outpatient Encounter Medications as of 06/11/2023  Medication Sig   Budeson-Glycopyrrol-Formoterol (BREZTRI AEROSPHERE) 160-9-4.8 MCG/ACT AERO Inhale 2 puffs into the lungs 2 (two) times daily.   EQ ALLERGY RELIEF, CETIRIZINE, 10 MG tablet Take 1 tablet by mouth once daily   losartan (COZAAR) 50 MG tablet Take 1 tablet (50 mg total) by mouth daily.   metFORMIN (GLUCOPHAGE) 500 MG tablet Take 1 tablet (500 mg total) by mouth 2 (two) times daily with a meal.   Multiple Vitamins-Minerals (MULTIVITAMIN WITH MINERALS) tablet Take 1 tablet daily by mouth.   Nintedanib (OFEV) 100 MG CAPS Take 1 capsule (100 mg total) by mouth 2 (two) times daily. (Patient taking differently: Take 100 mg by mouth daily.)   omeprazole (PRILOSEC) 40 MG capsule Take 1 capsule by mouth once daily   ondansetron (ZOFRAN) 4 MG tablet Take 1 tablet (4 mg total) by mouth 2 (two) times daily as needed for nausea or vomiting.   tamsulosin (FLOMAX) 0.4 MG CAPS capsule Take 1 capsule by mouth once daily   VENTOLIN HFA 108 (90 Base) MCG/ACT inhaler Inhale 2 puffs into the lungs every 6 (six) hours as needed for wheezing or shortness of breath.   No facility-administered encounter medications on file as of 06/11/2023.     Immunization History  Administered Date(s) Administered   Fluad Quad(high Dose 65+) 01/04/2019, 03/27/2020, 04/22/2022   Fluad Trivalent(High Dose 65+) 02/11/2023   Influenza, High Dose Seasonal PF 02/17/2017   Influenza,inj,Quad PF,6+ Mos  02/10/2014, 03/14/2015, 01/09/2016, 02/12/2018   Influenza-Unspecified 02/12/2018   PFIZER(Purple Top)SARS-COV-2 Vaccination 07/10/2019, 08/04/2019   Pneumococcal Conjugate-13 07/04/2014   Pneumococcal Polysaccharide-23 12/26/2011   Td 12/26/2011   Tdap 12/26/2011, 08/15/2022   Zoster Recombinant(Shingrix) 02/20/2021, 04/18/2021   Zoster, Live 02/10/2014     PFTs    Latest Ref Rng & Units 01/21/2023   11:30 AM 11/20/2022    8:57 AM 06/28/2022    9:00 AM 12/12/2015   10:53 AM  PFT Results  FVC-Pre L 2.32  2.40  2.14  2.86   FVC-Predicted Pre % 71  63  55  70   FVC-Post L   2.20    FVC-Predicted Post %   57    Pre FEV1/FVC % % 76  86  77  80   Post FEV1/FCV % %   85    FEV1-Pre L 1.76  2.08  1.64  2.28   FEV1-Predicted Pre % 77  78  60  77   FEV1-Post L   1.86    DLCO uncorrected ml/min/mmHg 20.39  22.11  19.66  26.92   DLCO UNC% % 96  94  83  86   DLCO corrected ml/min/mmHg 20.39  22.11  20.45    DLCO COR %Predicted % 96  94  86    DLVA Predicted % 147  135  131  122   TLC L   4.38  5.07   TLC % Predicted %   63  74   RV % Predicted %   70  73      Eosinophils Most recent blood eosinophil  count was *** cells/microL taken on ***.   IgE: *** on ***   Assessment   Biologics training for benralizumab Harrington Challenger)  Goals of therapy: Mechanism of action: humanized afucosylated, monoclonal antibody (IgG1, kappa) that binds to the alpha subunit of the interleukin-5 receptor. IL-5 is the major cytokine responsible for the growth and differentiation, recruitment, activation, and survival of eosinophils (a cell type associated with inflammation and an important component in the pathogenesis of asthma) Reviewed that Harrington Challenger is add-on medication and patient must continue maintenance inhaler regimen. Response to therapy: may take 4 months to determine efficacy. Discussed that patients generally feel improvement sooner than 4 months.  Side effects: antibody development (13%), headache  (8%), pharyngitis (5%), injection site reaction (2.2%)  Dose: 30 mg subcutaneously at Week 0, Week 4, Week 8, then 30mg  every 8 weeks thereafter  Administration/Storage:   Reviewed administration sites of thigh or abdomen (at least 2-3 inches away from abdomen). Reviewed the upper arm is only appropriate if caregiver is administering injection  Do not shake the pen/syringe as this could lead to product foaming or precipitation. \ Reviewed storage of medication in refrigerator. Reviewed that Harrington Challenger can be stored at room temperature in unopened carton for up to 14 days.  Side effects: headache (19%), injection site reaction (7-15%), antibody development (6%), backache (5%), fatigue (5%)  Access: Approval of Fasenra through: patient assistance  Provider administered Fasenra 30mg /mL in left lower abdomen using sample Fasenra 30mg /mL autoinjector pen NDC: [address]-30 Lot: [id number] Expiration: 06/2025  Patient monitored for 30 minutes for adverse reaction.  Patient tolerated well.  Injection site noted. {injectionreaction:30756}  Medication Reconciliation  A drug regimen assessment was performed, including review of allergies, interactions, disease-state management, dosing and immunization history. Medications were reviewed with the patient, including name, instructions, indication, goals of therapy, potential side effects, importance of adherence, and safe use.  Drug interaction(s): ***  Immunizations  Patient is indicated for the influenzae, pneumonia, and shingles vaccinations. Patient has received *** COVID19 vaccines.   PLAN Continue Fasenra 30mg  at Week 4 on 07/09/2023 and Week 8 on 08/06/2023. Then continue Fasenra 30mg  every 8 weeks thereafter starting on 10/01/2023. Rx sent to:  [employer] (phone: [phone]) . Patient provided with pharmacy phone number and advised to call later this week to schedule shipment to home. Continue maintenance asthma regimen of: ***  All questions  encouraged and answered.  Instructed patient to reach out with any further questions or concerns.  Thank you for allowing pharmacy to participate in this patient's care.  This appointment required *** minutes of patient care (this includes precharting, chart review, review of results, face-to-face care, etc.).

## 2023-06-13 ENCOUNTER — Other Ambulatory Visit: Payer: Self-pay | Admitting: Internal Medicine

## 2023-06-13 DIAGNOSIS — J84112 Idiopathic pulmonary fibrosis: Secondary | ICD-10-CM

## 2023-06-17 ENCOUNTER — Other Ambulatory Visit: Payer: Self-pay | Admitting: Family Medicine

## 2023-07-02 DIAGNOSIS — H5203 Hypermetropia, bilateral: Secondary | ICD-10-CM | POA: Diagnosis not present

## 2023-07-02 LAB — HM DIABETES EYE EXAM

## 2023-07-23 ENCOUNTER — Other Ambulatory Visit: Payer: Self-pay | Admitting: Family Medicine

## 2023-07-23 NOTE — Telephone Encounter (Signed)
 Requested by interface surescripts. Last labs 02/11/23 last OV 5 months ago . Needs appt for future refills.  Requested Prescriptions  Pending Prescriptions Disp Refills   metFORMIN (GLUCOPHAGE) 500 MG tablet [Pharmacy Med Name: metFORMIN HCl 500 MG Oral Tablet] 180 tablet 0    Sig: TAKE 1 TABLET BY MOUTH TWICE DAILY WITH A MEAL     Endocrinology:  Diabetes - Biguanides Failed - 07/23/2023  4:10 PM      Failed - Cr in normal range and within 360 days    Creat  Date Value Ref Range Status  02/11/2023 1.26 (H) 0.70 - 1.22 mg/dL Final   Creatinine, Urine  Date Value Ref Range Status  02/11/2023 137 20 - 320 mg/dL Final         Failed - eGFR in normal range and within 360 days    GFR, Est African American  Date Value Ref Range Status  02/21/2020 62 > OR = 60 mL/min/1.86m2 Final   GFR, Est Non African American  Date Value Ref Range Status  02/21/2020 54 (L) > OR = 60 mL/min/1.77m2 Final   GFR  Date Value Ref Range Status  12/12/2015 63.30 >60.00 mL/min Final   eGFR  Date Value Ref Range Status  02/11/2023 57 (L) > OR = 60 mL/min/1.15m2 Final  09/26/2021 50 (L) >59 mL/min/1.73 Final         Failed - B12 Level in normal range and within 720 days    No results found for: "VITAMINB12"       Passed - HBA1C is between 0 and 7.9 and within 180 days    Hgb A1c MFr Bld  Date Value Ref Range Status  02/11/2023 6.4 (H) <5.7 % of total Hgb Final    Comment:    For someone without known diabetes, a hemoglobin  A1c value between 5.7% and 6.4% is consistent with prediabetes and should be confirmed with a  follow-up test. . For someone with known diabetes, a value <7% indicates that their diabetes is well controlled. A1c targets should be individualized based on duration of diabetes, age, comorbid conditions, and other considerations. . This assay result is consistent with an increased risk of diabetes. . Currently, no consensus exists regarding use of hemoglobin A1c for  diagnosis of diabetes for children. Verna Czech - Valid encounter within last 6 months    Recent Outpatient Visits           3 months ago Viral URI with cough   [place] [facility] [place] Meo, FNP   5 months ago Diabetes mellitus without complication [facility])   [place] [facility] Tanya Nones, Priscille Heidelberg, MD   9 months ago Left sided sciatica   [place] [facility] Donita Brooks, MD   11 months ago Cellulitis of left lower extremity   [place] [facility] Tanya Nones, Priscille Heidelberg, MD   1 year ago Morbid obesity [facility])   [place] [facility] [name], Priscille Heidelberg, MD              Passed - CBC within normal limits and completed in the last 12 months    WBC  Date Value Ref Range Status  01/21/2023 11.7 (H) 4.0 - 10.5 K/uL Final   RBC  Date Value Ref Range Status  01/21/2023 4.50 4.22 - 5.81 Mil/uL Final   Hemoglobin  Date Value Ref Range  Status  01/21/2023 13.1 13.0 - 17.0 g/dL Final   HCT  Date Value Ref Range Status  01/21/2023 41.4 39.0 - 52.0 % Final   MCHC  Date Value Ref Range Status  01/21/2023 31.6 30.0 - 36.0 g/dL Final   [facility]  Date Value Ref Range Status  05/01/2021 29.6 27.0 - 33.0 pg Final   MCV  Date Value Ref Range Status  01/21/2023 92.0 78.0 - 100.0 fl Final   No results found for: "PLTCOUNTKUC", "LABPLAT", "POCPLA" RDW  Date Value Ref Range Status  01/21/2023 14.3 11.5 - 15.5 % Final

## 2023-08-05 ENCOUNTER — Other Ambulatory Visit: Payer: Self-pay | Admitting: Internal Medicine

## 2023-08-05 DIAGNOSIS — Z5181 Encounter for therapeutic drug level monitoring: Secondary | ICD-10-CM

## 2023-08-05 DIAGNOSIS — J84112 Idiopathic pulmonary fibrosis: Secondary | ICD-10-CM

## 2023-08-06 ENCOUNTER — Telehealth: Payer: Self-pay

## 2023-08-06 NOTE — Telephone Encounter (Signed)
 Submitted a Prior Authorization RENEWAL request to [facility] for OFEV  via CoverMyMeds. Will update once we receive a response.  Key: [id number]  Per automated response: Prior Authorization Not Required  Geraldene Kleine, PharmD, MPH, BCPS, CPP Clinical Pharmacist (Rheumatology and Pulmonology)

## 2023-08-11 NOTE — Telephone Encounter (Signed)
 Left VM for patient advising that patient is due hepatic function panel. Future order placed  Imane Burrough, PharmD, MPH, BCPS, CPP Clinical Pharmacist (Rheumatology and Pulmonology)

## 2023-08-11 NOTE — Addendum Note (Signed)
 Addended by: Thais Fill on: 08/11/2023 12:56 PM   Modules accepted: Orders

## 2023-08-15 ENCOUNTER — Other Ambulatory Visit: Payer: Self-pay | Admitting: Family Medicine

## 2023-08-21 ENCOUNTER — Other Ambulatory Visit

## 2023-08-21 DIAGNOSIS — J84112 Idiopathic pulmonary fibrosis: Secondary | ICD-10-CM

## 2023-08-21 DIAGNOSIS — H26491 Other secondary cataract, right eye: Secondary | ICD-10-CM | POA: Diagnosis not present

## 2023-08-21 DIAGNOSIS — H26492 Other secondary cataract, left eye: Secondary | ICD-10-CM | POA: Diagnosis not present

## 2023-08-21 DIAGNOSIS — Z5181 Encounter for therapeutic drug level monitoring: Secondary | ICD-10-CM

## 2023-08-21 LAB — HEPATIC FUNCTION PANEL
ALT: 12 U/L (ref 0–53)
AST: 16 U/L (ref 0–37)
Albumin: 4.2 g/dL (ref 3.5–5.2)
Alkaline Phosphatase: 55 U/L (ref 39–117)
Bilirubin, Direct: 0.1 mg/dL (ref 0.0–0.3)
Total Bilirubin: 0.4 mg/dL (ref 0.2–1.2)
Total Protein: 6.9 g/dL (ref 6.0–8.3)

## 2023-08-22 ENCOUNTER — Other Ambulatory Visit: Payer: Self-pay | Admitting: Pharmacist

## 2023-08-22 ENCOUNTER — Encounter: Payer: Self-pay | Admitting: Pharmacist

## 2023-08-22 DIAGNOSIS — J84112 Idiopathic pulmonary fibrosis: Secondary | ICD-10-CM

## 2023-08-22 MED ORDER — OFEV 100 MG PO CAPS: 1.0000 | ORAL_CAPSULE | Freq: Two times a day (BID) | ORAL | 1 refills | Status: DC

## 2023-09-03 ENCOUNTER — Other Ambulatory Visit: Payer: Self-pay | Admitting: Family Medicine

## 2023-09-04 ENCOUNTER — Ambulatory Visit
Admission: RE | Admit: 2023-09-04 | Discharge: 2023-09-04 | Disposition: A | Discharge status: Home / Self Care | Source: Ambulatory Visit | Attending: Internal Medicine | Admitting: Internal Medicine

## 2023-09-04 DIAGNOSIS — I712 Thoracic aortic aneurysm, without rupture, unspecified: Secondary | ICD-10-CM

## 2023-09-04 DIAGNOSIS — I7121 Aneurysm of the ascending aorta, without rupture: Secondary | ICD-10-CM | POA: Diagnosis not present

## 2023-09-04 MED ORDER — IOPAMIDOL (ISOVUE-370) INJECTION 76%
75.0000 mL | Freq: Once | INTRAVENOUS | Status: AC | PRN
  Administered 2023-09-04: 75 mL via INTRAVENOUS

## 2023-09-04 NOTE — Telephone Encounter (Signed)
 Unable to refill per protocol, accolate  discontinued 06/17/22.  Requested Prescriptions  Pending Prescriptions Disp Refills   zafirlukast  (ACCOLATE ) 20 MG tablet [Pharmacy Med Name: Zafirlukast  20 MG Oral Tablet] 180 tablet 0    Sig: TAKE 1 TABLET BY MOUTH TWICE DAILY BEFORE A MEAL . APPOINTMENT REQUIRED FOR FUTURE REFILLS     Pulmonology:  Leukotriene Inhibitors Passed - 09/04/2023  2:42 PM      Passed - Valid encounter within last 12 months    Recent Outpatient Visits           5 months ago Viral URI with cough   [place] [facility] Jenelle Mis, FNP   6 months ago Diabetes mellitus without complication [facility])   [place] [facility] Cheril Cork, Cisco Crest, MD   11 months ago Left sided sciatica   [place] [facility] Cheril Cork, Cisco Crest, MD   1 year ago Cellulitis of left lower extremity   [place] [facility] Austine Lefort, MD   1 year ago Morbid obesity [facility])   [place] [facility] [name], Cisco Crest, MD               tamsulosin  (FLOMAX ) 0.4 MG CAPS capsule [Pharmacy Med Name: Tamsulosin  HCl 0.4 MG Oral Capsule] 90 capsule 0    Sig: Take 1 capsule by mouth once daily     Urology: Alpha-Adrenergic Blocker Failed - 09/04/2023  2:42 PM      Failed - PSA in normal range and within 360 days    PSA  Date Value Ref Range Status  05/01/2021 4.35 (H) < OR = 4.00 ng/mL Final    Comment:    The total PSA value from this assay system is  standardized against the WHO standard. The test  result will be approximately 20% lower when compared  to the equimolar-standardized total PSA (Beckman  Coulter). Comparison of serial PSA results should be  interpreted with this fact in mind. . This test was performed using the Siemens  chemiluminescent method. Values obtained from  different assay methods cannot be used interchangeably. PSA levels, regardless of value, should not be interpreted  as absolute evidence of the presence or absence of disease.          Passed - Last BP in normal range    BP Readings from Last 1 Encounters:  04/02/23 130/80         Passed - Valid encounter within last 12 months    Recent Outpatient Visits           5 months ago Viral URI with cough   [place] [facility] Jenelle Mis, FNP   6 months ago Diabetes mellitus without complication [facility])   [place] [facility] Austine Lefort, MD   11 months ago Left sided sciatica   [place] [facility] Austine Lefort, MD   1 year ago Cellulitis of left lower extremity   [place] [facility] Austine Lefort, MD   1 year ago Morbid obesity [facility])   [place] [facility] [name], Cisco Crest, MD

## 2023-09-04 NOTE — Telephone Encounter (Signed)
 Submitted an URGENT Prior Authorization RENEWAL request to [facility] for OFEV  via CoverMyMeds. Will update once we receive a response.  Key: [id number]

## 2023-09-04 NOTE — Telephone Encounter (Signed)
 Copied from CRM [id number]. Topic: Clinical - Prescription Issue >> Sep 04, 2023  9:30 AM Ilene Malling wrote: Reason for CRM: Edwina Gram from [facility] pharmacy [phone] states PA is needed for OFEV  100 mg, the Cover my med key #[id number]. Please advise and call back.

## 2023-09-04 NOTE — Telephone Encounter (Signed)
 Error

## 2023-09-04 NOTE — Telephone Encounter (Signed)
 Received notification from [facility] regarding a prior authorization for OFEV . Authorization has been APPROVED from 09/04/23 to 09/03/24.   Authorization # [id number]  Geraldene Kleine, PharmD, MPH, BCPS, CPP Clinical Pharmacist (Rheumatology and Pulmonology)

## 2023-09-04 NOTE — Telephone Encounter (Signed)
 diptsy p from [facility] is calling about Ofev  100 mg capsules and patient insurance number for prior authorization  [id number] urgent prior authorization approval for medication Doctor has to call this number to get approval for the ofev  100 mg capsule Call back number for [facility] is [phone]

## 2023-09-05 ENCOUNTER — Ambulatory Visit (HOSPITAL_BASED_OUTPATIENT_CLINIC_OR_DEPARTMENT_OTHER): Payer: Self-pay | Admitting: Internal Medicine

## 2023-09-05 DIAGNOSIS — I712 Thoracic aortic aneurysm, without rupture, unspecified: Secondary | ICD-10-CM

## 2023-09-05 NOTE — Telephone Encounter (Signed)
 ATC X1. LMTCB

## 2023-09-11 ENCOUNTER — Telehealth: Payer: Self-pay

## 2023-09-11 NOTE — Telephone Encounter (Signed)
 Copied from CRM [id number]. Topic: General - Other >> Sep 09, 2023 12:13 PM Alverda Joe S wrote: Reason for CRM: patient states William Howell called him, please call patient back for further details.  Please advise

## 2023-09-14 ENCOUNTER — Other Ambulatory Visit: Payer: Self-pay | Admitting: Family Medicine

## 2023-09-15 ENCOUNTER — Ambulatory Visit: Payer: Medicare Other | Admitting: Internal Medicine

## 2023-09-15 VITALS — BP 122/70 | HR 63 | Ht 66.0 in | Wt 262.0 lb

## 2023-09-15 DIAGNOSIS — K521 Toxic gastroenteritis and colitis: Secondary | ICD-10-CM

## 2023-09-15 DIAGNOSIS — D721 Eosinophilia, unspecified: Secondary | ICD-10-CM | POA: Diagnosis not present

## 2023-09-15 DIAGNOSIS — J849 Interstitial pulmonary disease, unspecified: Secondary | ICD-10-CM | POA: Diagnosis not present

## 2023-09-15 DIAGNOSIS — J45991 Cough variant asthma: Secondary | ICD-10-CM | POA: Diagnosis not present

## 2023-09-15 NOTE — Progress Notes (Signed)
 OV 05/16/2022 -transfer of care to the ILD center with Dr. Bertrum Brodie.    Subjective:  Patient ID: William Howell, male , DOB: June 22, 1940 , age 83 y.o. , MRN: [id number] , ADDRESS: [address] [place] [address] PCP Austine Lefort, MD Patient Care Team: Austine Lefort, MD as PCP - General (Family Medicine) Maximo Spar Aviva Lemmings, MD as PCP - Cardiology (Cardiology) Maximo Spar Aviva Lemmings, MD as Consulting Physician (Cardiology)  This Provider for this visit: Treatment Team:  Attending Provider: Maire Scot, MD    05/16/2022 -   Chief Complaint  Patient presents with   Consult    Cough,      HPI William Howell 83 y.o. -is maternal grandfather to Dr. Orbie Binder pulmonologist at [facility].  He is transferring his care to Dr. Bertrum Brodie [me] because of concern of interstitial lung disease.  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  He does a lot of heavy manual work.  He has significant exposures that because of his farm work.  He has also obesity and has sleep apnea.  He uses CPAP at night previously followed by Dr. Deanna Expose (currently unclear who his new sleep doctor will be].  He reports dyspnea on exertion for at least the last 10-12 years but for the last 1 year it is progressive.  It is relieved by rest.  However since October 2023 has had a cough it initially got better but then November 2023 it came back again.  He recollects being treated for pneumonia.  Around this time in December 2023 a few days before Christmas he did have a CT scan of the chest that I personally visualized it does show right lower lobe density consistent with pneumonia].  Since then after treatment of pneumonia his cough is better but is still not back at baseline.  His shortness of breath is also not back at baseline.  Regarding his cough overall it was worse compared to a year ago although better compared to fall 2023.  He does bring out some  occasional clear to light yellow phlegm.  He does cough at night.  He does also have wheezing with the cough he does clear the throat but there is no hemoptysis.  The cough does not get worse when he lies down.  There is no tickle in his throat.  [place] [facility] Comprehensive ILD Questionnaire  Symptoms:   Past Medical History :  -Obesity -Sleep apnea on CPAP -Review of the external records indicate that in 2019 he was being treated for asthma by Dr. Earmon Glow. -Denies any connective tissue disease -Has hypertension Pneumonia right lower lobe December 2023 -He has had both COVID-vaccine and COVID disease but was never hospitalized for COVID.   ROS:  --Fatigue Shortness of breath Chronic cough Some weight loss Does not snore because of CPAP   FAMILY HISTORY of LUNG DISEASE:  -No family history of pulmonary fibrosis but his father did have COPD  PERSONAL EXPOSURE HISTORY:  -Non-smoker.  No marijuana use.  No cocaine use no intravenous drug use  HOME  EXPOSURE and HOBBY DETAILS :  -Lives and is fine to take a farm house single[place]  The home is 83 years old.  He is lived there for 60 years but detail organic and inorganic antigen exposure history in the house is negative  OCCUPATIONAL HISTORY (122 questions) : -He  worked in the Engineer, mining between [employer] during which time he was exposed to fertilizers.  Since 1970s has been active farming.  He has been exposed to working in [employer].  Is been a daily farmer a lot of chemical exposure.  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  He is also done sewage work.  None animal feed production.  Been a consideration is.  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  Has done foundry work, Microbiologist, Management consultant, Location manager, woodwork presents on 5  working.  Has been exposed to pesticides.  PULMONARY TOXICITY HISTORY (27 items):  -He has been on and off prednisone  for years but otherwise detailed pulmonary drug toxicity is negative  INVESTIGATIONS: -Last pulmonary function test in 2017 with reduced FVC but normal DLCO.  This is consistent with obesity. -CT chest without contrast December 2023:  -He does have right lower lobe pneumonia  -He does have ILD that I suspect is in a pattern not consistent with UIP/alternative to UIP pattern  -Onset: In 2017 CT scan of the chest he did not seem to have it v GGO/Air trapping  However 2022 he has some ILD changes. -   CT Chest data - CTWO contrast 04/04/22  Narrative & Impression  CLINICAL DATA:  Follow-up lung nodule seen on radiographs   EXAM: CT CHEST WITHOUT CONTRAST   TECHNIQUE: Multidetector CT imaging of the chest was performed following the standard protocol without IV contrast.   RADIATION DOSE REDUCTION: This exam was performed according to the departmental dose-optimization program which includes automated exposure control, adjustment of the mA and/or kV according to patient size and/or use of iterative reconstruction technique.   COMPARISON:  Radiographs 03/19/2022 and CTA chest 09/28/2021   FINDINGS: Cardiovascular: Normal heart size. No pericardial effusion. Aortic atherosclerotic calcification. The ascending aorta measures approximately 44 mm in diameter (coronal image 86) this previously measured 41 mm on CTA 09/28/2021 though direct comparison is limited by lack of IV contrast.   Mediastinum/Nodes: Increased size of multiple mediastinal lymph nodes. For example a right paratracheal node measures 1.0 cm (3/33), previously 0.6 cm. Unremarkable esophagus.   Lungs/Pleura: Patchy reticulonodular opacities and interstitial thickening are slightly progressed from 09/28/2021. Increased bronchial wall thickening and bronchiectasis/bronchiolectasis greatest in the  right lower lobe. Focal atelectasis/consolidation in the posterior right lower lobe. No pleural effusion or pneumothorax. No CT correlate for nodule seen on radiographs 03/19/2022.   Upper Abdomen: Cholecystectomy. Colonic diverticulosis without diverticulitis. No acute abnormality   Musculoskeletal: No chest wall mass or suspicious bone lesions identified.   IMPRESSION: 1. Pulmonary findings compatible with bronchitis/bronchiolitis with right lower lobe pneumonia. 2. Similar to slight progression of interstitial lung disease compared with 09/28/2021. 3. Ascending aortic aneurysm measures 44 mm in diameter on today's exam. This may be slightly increased from 09/28/2021 however comparison is difficult given differences in obliquity and lack of IV contrast. Continued annual imaging follow-up with CTA or MRA is recommended.     Electronically Signed   By: Rozell Cornet M.D.   On: 04/08/2022 00:02     OV 06/28/2022  Subjective:  Patient ID: William Howell, male , DOB: 1940/11/30 , age 37 y.o. , MRN: [id number] , ADDRESS: [address] [place] [address] PCP Austine Lefort, MD Patient Care Team: Austine Lefort, MD as PCP - General (Family Medicine) Maximo Spar Aviva Lemmings, MD as PCP -  Cardiology (Cardiology) Maximo Spar Aviva Lemmings, MD as Consulting Physician (Cardiology)  This Provider for this visit: Treatment Team:  Attending Provider: Maire Scot, MD  ILD workup in progress  06/28/2022 -   Chief Complaint  Patient presents with   Follow-up    COVID 05/28/2022.  Cough with yellow mucus.  SOB with exertion.   ILD workup in pgoress.  HPI TILMON WISEHART 83 y.o. -here with wife to review CT, PFT and serology. Says In interim had dental extraction and after that has had dimnished PO and lost 27#. Now a week ago started Ozempoc. HE is hoping to lose more weight but is already having some diarrhea./ Also mid feb 2024 had a bout of covid that was mild though is  having some small amout of cough with yellow sputum .  His PFTs show mild decline in FVC and DLCO in 7 year. . Visualization of images - I Agree is features are NOT cw UIP, Agree is more c/w HP or NSIP. Agree is mild. However, I am not sure of radiologic progression.   We decissused that biopsy is indicated but I did recommend that risk for SLB is > benefit. Did indicate at some point we can consider bronch biopy but at this point was to sort out if there is progerssion on CT . PFTs ae suggesting progression. On radiology repot (Dec 2023 suggested progression) but the current 2024 reporrt is equivocal. If there is progression, would recommend anti-fibrotic but did indicate to him tht markers of ILD for him are indicative of mild burden and likely only very slow progression. 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 Exposed to chemicals, mulch, fertilizer. First HRCT 2017, Some other CTs in 2022 and 2023. He has dyspnea on exertion x 12  years but progressive x 1 year.  End of 2023 got Rx for PNA>  but this could be all obesity. His PFTs 2017 -> 2023 show decline 2.86L/70% -> 2.14L/55%. DLCO: has gone from 26.9 -> 19.9 in same period but both listed as normal > 80%. Need to figure out if he should go an anti-fibrotic so need to see CT and see if he has progressive phenotype on ILD because symptoms could be from obesity  and also because overall burden of ILD is mild.   - Date or time period of scan:  HRCT: 06/12/2022 HRCT: 01/15/2016 - RAST allergy  neg 2017  - Discussion synopsis:  Feb 2024: ILD + , Upper lobe pednomnance, Primary feature is  ground glass, There is volume loss and archietctural distorion in Upper Lobe.   - What is the final conclusion per 2018 ATS/Fleischner Criteria - Alternative Diagnosis. Dx #1 is Chronic HP.  and worse since 2017 slowly. Also, in current FEb 2024 there is some acue process with nodularity - cocerconsuggestive of acute atypical process such as MAC   - Concordance with official report: DISCORDANT  Pathology discussion of biopsy: No lung biopsy   MDD Impression/Recs:   - Rx as HP - steroid can be problematic. Rx as progressive phentopye with anti-fibrotic. Also do bronc with BAL but no need for  TBbx      OV 08/20/2022  Subjective:  Patient ID: William Howell, male , DOB: Sep 10, 1940 , age 81 y.o. , MRN: [id number] , ADDRESS: [address] [place] [address] PCP Austine Lefort, MD Patient Care Team: Austine Lefort, MD as PCP - General (Family Medicine) Maximo Spar Aviva Lemmings, MD as PCP - Cardiology (Cardiology) Maximo Spar Aviva Lemmings, MD as Consulting Physician (Cardiology)  This Provider for this visit: Treatment Team:  Attending Provider: Maire Scot, MD    08/20/2022 -   Chief Complaint  Patient presents with   Follow-up    F/up  HPI William Howell 83 y.o. -returns for follow-up.  He says after the last visit and treating with antibiotic and prednisone  his cough did improve but is still there.  He does not wake up because of the cough at night but it does wake his wife up.  He says it is a little bit better but he does still have yellow phlegm.  Review of the labs indicate in 2017 RAST allergy  panel was normal.  His blood eosinophils are always elevated.  His exam nitric oxide  is slightly up.  His MAR shows that he is supposed to be taking Symbicort  but he does not recollect taking it.  MAR also shows him to be taking zafirlukast  but he is not sure if he is taking it.  I asked him to restart his Symbicort .  Regarding his ILD: We discussed with the case conference and the  consensus was that the overall disease burden is mild but it is definitely progressive over many years.  The thought process is that he has hypersensitive pneumonitis.  Steroids was deemed risk particularly over the long-term because of his multiple comorbidities, obesity and his age.  Surgical lung biopsy also would be risky.  Bronchoscopy could be considered an antifibrotic could be considered.  Last visit I thought you should go with pirfenidone because he was on Ozempic  and he was having some diarrhea.  However now he is off Ozempic  because of the side effects.  He did lose weight.  He feels he is gaining weight without the Ozempic .  He will talk to his primary care about other drugs.  We discussed the consensus in the conference that antifibrotic should be indicated for him even though the disease burden is very mild because it is progressive slowly.  We discussed pirfenidone versus nintedanib.  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  We did talk about the diarrhea risk and also extremely high cardiac risk.  Overall shared decision making to take nintedanib.  Decided to start with a low-dose protocol and then slowly escalate.  Also discussed avoidance measures for mold.  He states he is to be exposed heavily to hay and mold and chemicals and fertilizer.  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  I advised him to be diligent about avoiding these exposures.  He is in agreement.   09/30/2022- Interim hx  Patient presents today for 6 week follow-up/new OFEV  start. He was started on low dose Nintedanib 100mg  twice daily in early May. He has progressive ILD, symptom burden is mild. 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   Breathing is baseline, no changes. He has no acute complaints today. He has not been coughing like he was. FENO was elevated at previous visit indicating allergic component contributing to cough. He has used Symbicort  a couple of times  but does  not require daily.     OV 11/25/2022  Subjective:  Patient ID: William Howell, male , DOB: 13-Apr-1941 , age 57 y.o. , MRN: [id number] , ADDRESS: [address] [place] [address] PCP Austine Lefort, MD Patient Care Team: Austine Lefort, MD as PCP - General (Family Medicine) Maximo Spar Aviva Lemmings, MD as PCP - Cardiology (Cardiology) Maximo Spar Aviva Lemmings, MD as Consulting Physician (Cardiology)  This Provider for this visit: Treatment Team:  Attending Provider: Maire Scot, MD   11/25/2022 -   Chief Complaint  Patient presents with   Follow-up    F/up on PFT, BP high since starting ofev , diarrhea, and weight gain more the 2 lbs.      HPI CAIDEN ARTEAGA 83 y.o. -presents for follow-up.  He is here with his wife who is an independent historian.  He had to go to the beach and so he delayed starting his nintedanib.  Has been taking it for 2 weeks now on the low-dose protocol 100 mg twice daily.  He had diarrhea initially but this resolved.  He is concerned that his unintended might be increasing his blood pressure.  Last night was 170 systolic.  This morning after he took his losartan  and after the morning dose nintedanib here in our office it is normal.  However he does not check his night blood pressure on a consistent basis.  He checked it randomly.  We do not know if prenintedanib his night blood pressure was going up because he never checked it.  He also uses a wrist cuff monitor.  Therefore I shared with him the some uncertainty whether his nintedanib is really causing hypertension although I did admit that nintedanib can increase the blood pressure but it is an uncommon side effect.  Currently is not having any cough.  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  He quit taking his Symbicort  for no particular reason.  I recommended he restart it.  And also use a mask with his farming work.  He had pulmonary function test today and is stable.  Symptom scores are  stable.      OV 01/21/2023  Subjective:  Patient ID: William Howell, male , DOB: 1940-08-09 , age 61 y.o. , MRN: [id number] , ADDRESS: [address] [place] [address] PCP Austine Lefort, MD Patient Care Team: Austine Lefort, MD as PCP - General (Family Medicine) Maximo Spar Aviva Lemmings, MD as PCP - Cardiology (Cardiology) Maximo Spar Aviva Lemmings, MD as Consulting Physician (Cardiology)  This Provider for this visit: Treatment Team:  Attending Provider: Maire Scot, MD    01/21/2023 -   Chief Complaint  Patient presents with   Follow-up    Pt states he has not been well the last few days, cough, wheezing. Pt has not been using his albuterol  inhaler or symbicort .    -Hypersensitivity progressive phenotype  -Wheezing phenotype not taking his Symbicort .  High blood eosinophils but normal RAST panel  - Nintedanib/Ofev  requires intensive drug monitoring due to high concerns for Adverse effects of , including  Drug Induced Liver Injury, significant GI side effects that include but not limited to Diarrhea, Nausea, Vomiting,  and other system side effects that include Fatigue,  weight loss. Cardiac side effects are a black box warning as well. These will be monitored with  blood work such as LFT initially once a month for 6 months and then quarterly   HPI Estuardo Frisbee Weathers 82 y.o. -follow-up hypersensitive pneumonitis with asthma  phenotype overlap.  He is here with his wife she is an independent historian.  He continues on nintedanib but he is having significant side effects.  He says initially they came on but resolved but for the last few weeks the back.  For the last few days he is only taking 100 mg once daily.  The symptoms are decreased appetite decreased p.o. intake decreased taste increased diarrhea and also flatulence.  He did pulmonary function test today and is stable through the course of this year.  In addition for the last 5 days is reporting increased cough increase  sputum.  Change in color of sputum to yellow.  He is increased his farming activities for the fall.  He does unique technque Education administrator crop planting.  This increases the nitrogen content of the soil.  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  He says he is using a cover tractor.  There are no sick contacts.  The weather is also changes no fall in this range.  But it is unclear why he is having the symptoms.  He is got active wheezing today wife is really concerned about this he is also concerned about this.  He is not taking his Symbicort .  This because it was expensive.  Will try to get another inhaler for him.  He does have some samples of Breztri .    OV 04/21/2023  Subjective:  Patient ID: William Howell, male , DOB: September 25, 1940 , age 42 y.o. , MRN: [id number] , ADDRESS: [address] [place] [address] PCP Austine Lefort, MD Patient Care Team: Austine Lefort, MD as PCP - General (Family Medicine) Maximo Spar Aviva Lemmings, MD as PCP - Cardiology (Cardiology) Maximo Spar Aviva Lemmings, MD as Consulting Physician (Cardiology)  This Provider for this visit: Treatment Team:  Attending Provider: Maire Scot, MD  24  04/21/2023 -   Chief Complaint  Patient presents with   Follow-up    Getting over a cold. He states today his breathing stable.    Type of visit: Video Virtual Visit Identification of patient LYNETTE NOAH with November 21, 1940 and MRN [id number] - 2 person identifier Risks: Risks, benefits, limitations of telephone visit explained. Patient understood and verbalized agreement to proceed Anyone else on call: just wife Patient location: his home This provider location: [address], [address]; [place]; [place] [address]. [facility]. [phone]   HPI ANNETTE LIOTTA 83 y.o. -at last visit he was in a wheezing exacerbation.  Z-Pak and prednisone  was called in.  We checked his blood eosinophils and it was high.  Therefore I  recommended Dupixent or another biologic.  However, I do not see any evidence of further follow-up.  He did get some Levaquin  in mid December for 2024 through primary care physician based on chart review.  He also had a cough at that time and was given Hycodan cough syrup.  His blood eosinophils have been quite high between 500 and 600 cells per cubic millimeter in the year 2024 as recently as October 2024.  Currently is doing well except that he is having nausea with nintedanib.  He tells me that after some in October 2024 he gave her 2-week holiday with the nintedanib and then restarted the low-dose 100 mg twice daily but he is only taking it at once daily.  Even with this is some mild nausea for 30 minutes and he is having some mild diarrhea.  He is  willing to rechallenge himself to twice daily.  We talked about taking Zofran  and he is agreeable.  I sent in the prescription.  Also talked about his high eosinophil load and recommended biologic.  We have settled on Fasenra  because of the schedule.  Have sent a message to our pharmacy team.  His wife is also part of the conversation.  Although she was not an independent historian today.  He has upcoming CT chest in February but no office visit.     OV 09/15/2023  Subjective:  Patient ID: Genette Kent, male , DOB: 10/17/1940 , age 68 y.o. , MRN: [id number] , ADDRESS: [address] [place] [address] PCP Austine Lefort, MD Patient Care Team: Austine Lefort, MD as PCP - General (Family Medicine) Hazle Lites, MD as PCP - Cardiology (Cardiology) Maximo Spar Aviva Lemmings, MD as Consulting Physician (Cardiology)  This Provider for this visit: Treatment Team:  Attending Provider: Maire Scot, MD    09/15/2023 -   Chief Complaint  Patient presents with   Interstitial Lung Disease     ILD workup in progress: Post multidisciplinary case conference diagnosis hypersensitive pneumonitis from farming  -Disease burden is mild  Also  chronic cough  -Normal RAST allergy  testing 2017  -Elevated nitric oxide  May 2024  - Elevated eosinophils - 500 cells in feb 2024, 600 cells Oct 2024  - STRT FASENRA  early sprin 2025    Morbid obesity  -Previously on Ozempic  and stopped because of side effects; early 20   #Nintedanib/Ofev  requires intensive drug monitoring due to high concerns for Adverse effects of , including  Drug Induced Liver Injury, significant GI side effects that include but not limited to Diarrhea, Nausea, Vomiting,  and other system side effects that include Fatigue,  weight loss. Cardiac side effects are a black box warning as well. These will be monitored with  blood work such as LFT initially once a month for 6 months and then quarterly    HPI Oumar Marcott O'BRYANT 83 y.o. -returns for follow-up.  Presents with his wife.  He is now on Fasenra  since I last saw him and with this the cough is nearly resolved.  He is pretty happy about that.  In terms of his dyspnea on exertion it is stable.  See symptom score below.  He is unable to tolerate his nintedanib even at the low-dose.  He is taking 1 tablet a day on alternate days and twice daily on alternate days.  This because of diarrhea.  Does not much of nausea.  We discussed the role of CAROB flour and controlling diarrhea and he is willing to try this.  This is to help with compliance.  If this does not work hopefully in the next few months new medication Nerandomilast might be approved.  And he is willing to consider that.  In terms of disease progression he had a high-resolution CT chest that I personally visualized and at least in recent times it seems more organized and stable.  Although over time he has progressed.  He had liver function test earlier this month and I personally visualized it and it is normal.   SYMPTOM SCALE - ILD 05/16/2022 06/28/2022   08/20/2022 272# 09/30/2022  11/25/2022  01/21/2023 ofev  09/15/2023 Ofev  irregular + fasernal since spring 2025   Current weight            O2 use ra ra ra RA  ra ra ra  Shortness of Breath 0 -> 5 scale  with 5 being worst (score 6 If unable to do)       2   At rest 1 1 3  0 2 2 3   Simple tasks - showers, clothes change, eating, shaving 2 2 3 3 3 2 3   Household (dishes, doing bed, laundry) 4 3 3 3 4 3 4   Shopping 3 3 4 3 3 3 4   Walking level at own pace 4 3 4 3 3 3 3   Walking up Stairs 4 3 4 4 4 4 4   Total (30-36) Dyspnea Score 11 15 21 16 19 17 21      Non-dyspnea symptoms (0-> 5 scale) 05/16/2022 06/28/2022  08/20/2022  11/25/2022  01/21/2023  09/15/2023   How bad is your cough? 3 2 3 2 3 2   How bad is your fatigue 4 3 - Rx predn/abx 3 3 2 3   How bad is nausea 0 0 0 2 0 2  How bad is vomiting?  0 0 0 0 0 1  How bad is diarrhea? 0 0 - mild after ozempic  start a wekk aog 0 1 3 3   How bad is anxiety? 1 0 0  0 2  How bad is depression 1 0 0  0 2  Any chronic pain - if so where and how bad 1 0 0        Simple office walk 224 (66+46 x 2) feet Pod A at [facility] x  3 laps goal with forehead probe 08/20/2022    O2 used ra   Number laps completed 15 x sit stand   Comments about pace good   Resting Pulse Ox/HR 97% and 79/min   Final Pulse Ox/HR 98% and 94/min   Desaturated </= 88% no   Desaturated <= 3% points no   Got Tachycardic >/= 90/min yes   Symptoms at end of test Level 4 dyspnea of 10   Miscellaneous comments x        SIT STAND TEST - goal 15 times   09/15/2023    O2 used ra   PRobe - finter or forehead finger   Number sit and stand completed - goal 15 15   Time taken to complete Did not measure   Resting Pulse Ox/HR/Dyspnea  97% and 63/min and dyspnea of 0/10    Peak measures 98 % and 96/min and dyspnea of 2/10   Final Pulse Ox/HR 98% and 70/min and dyspnea of 3/10   Desaturated </= 88% no   Desaturated <= 3% points no   Got Tachycardic >/= 90/min yes   Miscellaneous comments Nrmal pace      CT Chest data from date: feb 2024  - personally visualized and independently  interpreted : yes - my findings are: agree   IMPRESSION: 1. Pulmonary parenchymal pattern of interstitial lung disease, as detailed above, possibly more organized than on 09/20/2022 and progressive from 09/28/2020. 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 Findings are suggestive of an alternative diagnosis (not UIP) per consensus guidelines: Diagnosis of Idiopathic Pulmonary Fibrosis: An Official ATS/ERS/JRS/ALAT Clinical Practice Guideline. Am Annie Barton Crit Care Med Vol 198, Iss 5, [id number], Dec 14 2016. 2. 4.4 cm ascending aortic aneurysm, stable. Recommend annual imaging followup by CTA or MRA. This recommendation follows 2010 ACCF/AHA/AATS/ACR/ASA/SCA/SCAI/SIR/STS/SVM Guidelines for the Diagnosis and Management of Patients with Thoracic Aortic Disease. Circulation. 2010; 121: O962-X528. Aortic aneurysm NOS (ICD10-I71.9). 3. Tiny left renal stones. 4.  Aortic atherosclerosis (ICD10-I70.0).     Electronically Signed   By: Eldonna Greenspan  Blietz M.D.   On: 06/09/2023 08:20   PFT     Latest Ref Rng & Units 01/21/2023   11:30 AM 11/20/2022    8:57 AM 06/28/2022    9:00 AM 12/12/2015   10:53 AM  PFT Results  FVC-Pre L 2.32  2.40  2.14  2.86   FVC-Predicted Pre % 71  63  55  70   FVC-Post L   2.20    FVC-Predicted Post %   57    Pre FEV1/FVC % % 76  86  77  80   Post FEV1/FCV % %   85    FEV1-Pre L 1.76  2.08  1.64  2.28   FEV1-Predicted Pre % 77  78  60  77   FEV1-Post L   1.86    DLCO uncorrected ml/min/mmHg 20.39  22.11  19.66  26.92   DLCO UNC% % 96  94  83  86   DLCO corrected ml/min/mmHg 20.39  22.11  20.45    DLCO COR %Predicted % 96  94  86    DLVA Predicted % 147  135  131  122   TLC L   4.38  5.07   TLC % Predicted %   63  74   RV % Predicted %   70  73        LAB RESULTS last 96 hours No results found.       has a past medical history of Allergy , Asthma, Diabetes mellitus type II, controlled (HCC), GERD (gastroesophageal reflux  disease), Hypertension, Obesity, and Sleep apnea.   reports that he has never smoked. He has quit using smokeless tobacco.  His smokeless tobacco use included chew.  Past Surgical History:  Procedure Laterality Date   CHOLECYSTECTOMY     DENTAL SURGERY      No Known Allergies  Immunization History  Administered Date(s) Administered   Fluad Quad(high Dose 65+) 01/04/2019, 03/27/2020, 04/22/2022   Fluad Trivalent(High Dose 65+) 02/11/2023   Influenza, High Dose Seasonal PF 02/17/2017   Influenza,inj,Quad PF,6+ Mos 02/10/2014, 03/14/2015, 01/09/2016, 02/12/2018   Influenza-Unspecified 02/12/2018   PFIZER(Purple Top)SARS-COV-2 Vaccination 07/10/2019, 08/04/2019   Pneumococcal Conjugate-13 07/04/2014   Pneumococcal Polysaccharide-23 12/26/2011   Td 12/26/2011   Tdap 12/26/2011, 08/15/2022   Zoster Recombinant(Shingrix) 02/20/2021, 04/18/2021   Zoster, Live 02/10/2014    Family History  Problem Relation Age of Onset   Heart disease Father    Heart attack Father    Heart attack Sister    Colon cancer Neg Hx    Colon polyps Neg Hx    Esophageal cancer Neg Hx    Rectal cancer Neg Hx    Stomach cancer Neg Hx    Pancreatic cancer Neg Hx    Liver cancer Neg Hx    Prostate cancer Neg Hx      Current Outpatient Medications:    benralizumab  (FASENRA  PEN) 30 MG/ML prefilled autoinjector, Inject 30mg  into the skin at Week 0, Week 4, Disp: 1 mL, Rfl: 1   benralizumab  (FASENRA  PEN) 30 MG/ML prefilled autoinjector, Inject 30mg  into the skin at Week 8 and every 8 weeks, Disp: 1 mL, Rfl: 1   Budeson-Glycopyrrol-Formoterol  (BREZTRI  AEROSPHERE) 160-9-4.8 MCG/ACT AERO, Inhale 2 puffs into the lungs 2 (two) times daily., Disp: 10.7 g, Rfl: 11   EQ ALLERGY  RELIEF, CETIRIZINE , 10 MG tablet, Take 1 tablet by mouth once daily, Disp: 90 tablet, Rfl: 0   losartan  (COZAAR ) 50 MG tablet, Take 1 tablet by mouth once daily,  Disp: 90 tablet, Rfl: 0   metFORMIN  (GLUCOPHAGE ) 500 MG tablet, TAKE 1 TABLET  BY MOUTH TWICE DAILY WITH A MEAL, Disp: 180 tablet, Rfl: 0   Multiple Vitamins-Minerals (MULTIVITAMIN WITH MINERALS) tablet, Take 1 tablet daily by mouth., Disp: , Rfl:    Nintedanib (OFEV ) 100 MG CAPS, Take 1 capsule (100 mg total) by mouth 2 (two) times daily., Disp: 180 capsule, Rfl: 1   omeprazole  (PRILOSEC) 40 MG capsule, Take 1 capsule by mouth once daily, Disp: 90 capsule, Rfl: 0   tamsulosin  (FLOMAX ) 0.4 MG CAPS capsule, Take 1 capsule by mouth once daily, Disp: 90 capsule, Rfl: 0   VENTOLIN  HFA 108 (90 Base) MCG/ACT inhaler, Inhale 2 puffs into the lungs every 6 (six) hours as needed for wheezing or shortness of breath., Disp: 18 g, Rfl: 0      Objective:   Vitals:   09/15/23 0905  BP: 122/70  Pulse: 63  SpO2: 98%  Weight: 262 lb (118.8 kg)  Height: 5\' 6"  (1.676 m)    Estimated body mass index is 42.29 kg/m as calculated from the following:   Height as of this encounter: 5\' 6"  (1.676 m).   Weight as of this encounter: 262 lb (118.8 kg).  @WEIGHTCHANGE @  [employer]   09/15/23 0905  Weight: 262 lb (118.8 kg)     Physical Exam   General: No distress. obese O2 at rest: no Cane present: no Sitting in wheel chair: no Frail: no Obese: no Neuro: Alert and Oriented x 3. GCS 15. Speech normal Psych: Pleasant Resp:  Barrel Chest - no.  Wheeze - no, Crackles - no, No overt respiratory distress CVS: Normal heart sounds. Murmurs - no Ext: Stigmata of Connective Tissue Disease - no HEENT: Normal upper airway. PEERL +. No post nasal drip        Assessment:       ICD-10-CM   1. ILD (interstitial lung disease) (HCC)  J84.9 Pulmonary function test    2. Eosinophilia, unspecified type  D72.10 Pulmonary function test    3. Cough variant asthma  J45.991 Pulmonary function test    4. 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 # Therapeutic drug monitoring # Nausea and diarrhea due to drug  - ILD is worse since 2022 but more stable recently -Symptoms are also stable -Currently on nintedanib 100 mg once daily with some  diarrhea - This is making you take nintedanib even the low-dose irregularly -  Plan - Take CAROB Flour for Diarrhea due to medication as follows Take 1 DESSERT spoon  size serving [approximately 7 g] before breakfast If still no response in 3 days then add another 7 g at dinner If still no response in 3 days then make it to spoon servings at breakfast and 2 spoon servings at dinner and hold NOTE: Always MIX the CAROB FLOUR with WATER or MILK or JUICE - MIGHT NEED A BLENDER to do it DO NOT EAT CAROB POWER DIRECTLY - it can choke or make you cough  - Try to  nintedanib to 100 mg twice daily with help pf carob  - at next visit we can see if new drug NERANDILOMLAST is approved    Cough variant asthma Associated eosinophilia   - 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    SIGNATURE    Dr. Maire Scot, M.D., F.C.C.P,  Pulmonary and Critical Care Medicine Staff Physician, [facility] Center Director - Interstitial Lung Disease  Program  Pulmonary Fibrosis [facility] at [facility] [place], [place], [address]  Pager: [phone], If no answer or between  15:00h - 7:00h: call [phone] Telephone: [phone]  9:35 AM 09/15/2023

## 2023-09-15 NOTE — Patient Instructions (Addendum)
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 # Therapeutic drug monitoring # Nausea and diarrhea due to drug  - ILD is worse since 2022 but more stable recently -Symptoms are also stable -Currently on nintedanib 100 mg once daily with some  diarrhea - This is making you take nintedanib even the low-dose irregularly -  Plan - Take CAROB Flour for Diarrhea due to medication as follows Take 1 DESSERT spoon  size serving [approximately 7 g] before breakfast If still no response in 3 days then add another 7 g at dinner If still no response in 3 days then make it to spoon servings at breakfast and 2 spoon servings at dinner and hold NOTE: Always MIX the CAROB FLOUR with WATER or MILK or JUICE - MIGHT NEED A BLENDER to do it DO NOT EAT CAROB POWER DIRECTLY - it can choke or make you cough  - Try to  nintedanib to 100 mg twice daily with help pf carob  - at next visit we can see if new drug NERANDILOMLAST is approved    Cough variant asthma Associated eosinophilia   - 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

## 2023-09-16 NOTE — Telephone Encounter (Signed)
 I do not see a note where I tried to call this pt? Pt saw Dr Bertrum Brodie yesterday as well. NFN

## 2023-09-23 ENCOUNTER — Other Ambulatory Visit: Payer: Self-pay | Admitting: Pharmacist

## 2023-09-23 DIAGNOSIS — J84112 Idiopathic pulmonary fibrosis: Secondary | ICD-10-CM

## 2023-09-23 MED ORDER — OFEV 100 MG PO CAPS: 1.0000 | ORAL_CAPSULE | Freq: Two times a day (BID) | ORAL | 1 refills | Status: DC

## 2023-09-23 NOTE — Telephone Encounter (Signed)
 Refill sent for OFEV  to [facility]: [phone]  Dose: 100mg  twice daily  Last OV: 09/15/2023 Provider: Dr. Bertrum Brodie Pertinent labs: LFTs on 09/15/23 wnl  Next OV: not scheduled; due in Sept 2025  Geraldene Kleine, PharmD, MPH, BCPS Clinical Pharmacist (Rheumatology and Pulmonology)

## 2023-10-15 DIAGNOSIS — R32 Unspecified urinary incontinence: Secondary | ICD-10-CM | POA: Diagnosis not present

## 2023-10-15 DIAGNOSIS — J4489 Other specified chronic obstructive pulmonary disease: Secondary | ICD-10-CM | POA: Diagnosis not present

## 2023-10-23 ENCOUNTER — Other Ambulatory Visit: Payer: Self-pay | Admitting: Family Medicine

## 2023-10-24 ENCOUNTER — Other Ambulatory Visit: Payer: Self-pay | Admitting: Family Medicine

## 2023-10-30 ENCOUNTER — Encounter: Payer: Self-pay | Admitting: Family Medicine

## 2023-10-30 ENCOUNTER — Ambulatory Visit: Admitting: Family Medicine

## 2023-10-30 VITALS — BP 128/72 | HR 86 | Temp 97.8°F | Ht 66.0 in | Wt 234.8 lb

## 2023-10-30 DIAGNOSIS — E119 Type 2 diabetes mellitus without complications: Secondary | ICD-10-CM | POA: Diagnosis not present

## 2023-10-30 DIAGNOSIS — Z7984 Long term (current) use of oral hypoglycemic drugs: Secondary | ICD-10-CM | POA: Diagnosis not present

## 2023-10-30 MED ORDER — ZAFIRLUKAST 20 MG PO TABS: 20.0000 mg | ORAL_TABLET | Freq: Two times a day (BID) | ORAL | 3 refills | Status: AC

## 2023-10-30 NOTE — Progress Notes (Signed)
 Subjective:    Patient ID: William Howell, male    DOB: 1941-03-26, 83 y.o.   MRN: [id number]  06/17/22 Patient is here today for refills on his metformin .  He has a history of diabetes.  His last A1c was 6.3 that was over a year ago.  Since I saw the patient, he has met with his pulmonologist and they have ordered a high-resolution CT scan of the chest.  I reviewed the findings with him today.  The clinical appearance favors a hypersensitivity pneumonitis similar to farmers lung.  Patient is going his best to avoid irritants.  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  He thinks his breathing may be a little bit better.  His blood pressure today is well-controlled.  He has not started the Ozempic .  We discussed Ozempic  as a means to help achieve weight loss try to help with breathing and control blood sugar however he has not yet started the medication.  At that time, my plan was: I think weight loss would help his breathing by reducing resistance and possible contribution of obesity hypoventilation syndrome.  Therefore I recommended he start Ozempic  0.5 mg subcu weekly and uptitrate monthly as tolerated.  I will check an A1c today.  Goal A1c is less than 6.5.  Continue the metformin  at the present time  02/11/23 Patient is here today to recheck his diabetes.  His last hemoglobin A1c was 6.3 in March.  Since that time, the patient had to stop Ozempic  because of an upset stomach.  He also had to quit ofev  due to diarrhea.  Of note, he is on metformin  but he states that metformin  does not cause diarrhea for him.  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    TBlood pressure today is excellent at 120/72.  10/30/23 Patient is here today for a follow-up on his diabetes.  He is currently on metformin .  He had to quit Ozempic  due to nausea and upset stomach.  He is currently on Fasenra  for  hypersensitivity pneumonitis and interstitial lung disease.  He is losing weight dramatically Wt Readings from Last 3 Encounters:  10/30/23 234 lb 12.8 oz (106.5 kg)  09/15/23 262 lb (118.8 kg)  04/02/23 264 lb (119.7 kg)   This seems to be helping his breathing substantially.  He denies any chest pain or shortness of breath or dyspnea on exertion.  He does blame ofev  for diarrhea.  He states that the metformin  does not upset his stomach however.  Patient denies any polyuria polydipsia or blurry vision. Past Medical History:  Diagnosis Date   Allergy     Asthma    Diabetes mellitus type II, controlled (HCC)    GERD (gastroesophageal reflux disease)    pt states he takes omeprazole  but doesnt have GERD   Hypertension    Obesity    Sleep apnea    uses CPAP   Past Surgical History:  Procedure Laterality Date   CHOLECYSTECTOMY     DENTAL SURGERY     Current Outpatient Medications on File Prior to Visit  Medication Sig Dispense Refill   benralizumab  (FASENRA  PEN) 30 MG/ML prefilled autoinjector Inject 30mg  into the skin at Week 8 and every 8 weeks 1 mL 1   Budeson-Glycopyrrol-Formoterol  (BREZTRI  AEROSPHERE) 160-9-4.8 MCG/ACT AERO Inhale 2 puffs into the lungs 2 (two) times daily. 10.7 g 11   cetirizine  (  ZYRTEC ) 10 MG tablet Take 1 tablet by mouth once daily 90 tablet 0   losartan  (COZAAR ) 50 MG tablet Take 1 tablet by mouth once daily 90 tablet 0   metFORMIN  (GLUCOPHAGE ) 500 MG tablet TAKE 1 TABLET BY MOUTH TWICE DAILY WITH A MEAL 180 tablet 0   Multiple Vitamins-Minerals (MULTIVITAMIN WITH MINERALS) tablet Take 1 tablet daily by mouth.     Nintedanib (OFEV ) 100 MG CAPS Take 1 capsule (100 mg total) by mouth 2 (two) times daily. 180 capsule 1   omeprazole  (PRILOSEC) 40 MG capsule Take 1 capsule by mouth once daily 90 capsule 0   tamsulosin  (FLOMAX ) 0.4 MG CAPS capsule Take 1 capsule by mouth once daily 90 capsule 0   VENTOLIN  HFA 108 (90 Base) MCG/ACT inhaler Inhale 2 puffs into the  lungs every 6 (six) hours as needed for wheezing or shortness of breath. 18 g 0   zafirlukast  (ACCOLATE ) 20 MG tablet Take 20 mg by mouth 2 (two) times daily.     benralizumab  (FASENRA  PEN) 30 MG/ML prefilled autoinjector Inject 30mg  into the skin at Week 0, Week 4 1 mL 1   No current facility-administered medications on file prior to visit.   No Known Allergies Social History   Socioeconomic History   Marital status: Married    Spouse name: Channing   Number of children: 5   Years of education: Not on file   Highest education level: 12th grade  Occupational History   Not on file  Tobacco Use   Smoking status: Never   Smokeless tobacco: Former    Types: Engineer, drilling   Vaping status: Never Used  Substance and Sexual Activity   Alcohol use: Yes    Comment: occasional    Drug use: No   Sexual activity: Not Currently  Other Topics Concern   Not on file  Social History Narrative   3 children, 2 stepchildren   12 grandchildren   7 great grandchildren.   Retired Psychologist, forensic.    Social Drivers of Corporate investment banker Strain: Low Risk  (10/29/2023)   Overall Financial Resource Strain (CARDIA)    Difficulty of Paying Living Expenses: Not hard at all  Food Insecurity: No Food Insecurity (10/29/2023)   Hunger Vital Sign    Worried About Running Out of Food in the Last Year: Never true    Ran Out of Food in the Last Year: Never true  Transportation Needs: No Transportation Needs (10/29/2023)   PRAPARE - Administrator, Civil Service (Medical): No    Lack of Transportation (Non-Medical): No  Physical Activity: Sufficiently Active (10/29/2023)   Exercise Vital Sign    Days of Exercise per Week: 6 days    Minutes of Exercise per Session: 60 min  Stress: No Stress Concern Present (10/29/2023)   [employer] of Occupational Health - Occupational Stress Questionnaire    Feeling of Stress: Only a little  Social Connections: Moderately Isolated  (10/29/2023)   Social Connection and Isolation Panel    Frequency of Communication with Friends and Family: More than three times a week    Frequency of Social Gatherings with Friends and Family: More than three times a week    Attends Religious Services: Patient declined    Active Member of Clubs or Organizations: No    Attends Banker Meetings: Not on file    Marital Status: Married  Intimate Partner Violence: Not At Risk (10/05/2021)   Humiliation,  Afraid, Rape, and Kick questionnaire    Fear of Current or Ex-Partner: No    Emotionally Abused: No    Physically Abused: No    Sexually Abused: No      Review of Systems  All other systems reviewed and are negative.      Objective:   Physical Exam Vitals reviewed.  Constitutional:      General: He is not in acute distress.    Appearance: He is well-developed. He is not diaphoretic.  HENT:     Head: Normocephalic and atraumatic.     Right Ear: Tympanic membrane, ear canal and external ear normal.     Left Ear: Tympanic membrane, ear canal and external ear normal.     Mouth/Throat:     Pharynx: No oropharyngeal exudate or posterior oropharyngeal erythema.  Eyes:     General: No scleral icterus.       Right eye: No discharge.        Left eye: No discharge.     Conjunctiva/sclera: Conjunctivae normal.     Pupils: Pupils are equal, round, and reactive to light.  Neck:     Thyroid: No thyromegaly.     Vascular: No JVD.     Trachea: No tracheal deviation.  Cardiovascular:     Rate and Rhythm: Normal rate and regular rhythm.     Heart sounds: Normal heart sounds. No murmur heard.    No friction rub. No gallop.  Pulmonary:     Effort: Pulmonary effort is normal. No accessory muscle usage, prolonged expiration or respiratory distress.     Breath sounds: No stridor or decreased air movement. No wheezing, rhonchi or rales.  Chest:     Chest wall: No tenderness.  Musculoskeletal:     Cervical back: Normal range of  motion and neck supple.  Lymphadenopathy:     Cervical: No cervical adenopathy.  Skin:    General: Skin is warm.     Coloration: Skin is not pale.     Findings: No erythema or rash.  Neurological:     Mental Status: He is alert and oriented to person, place, and time.     Cranial Nerves: No cranial nerve deficit.     Motor: No abnormal muscle tone.     Coordination: Coordination normal.     Deep Tendon Reflexes: Reflexes are normal and symmetric.  Psychiatric:        Behavior: Behavior normal.        Thought Content: Thought content normal.        Judgment: Judgment normal.           Assessment & Plan:  Diabetes mellitus without complication (HCC) - Plan: CBC with Differential/Platelet, Comprehensive metabolic panel with GFR, Lipid panel, Hemoglobin A1c Patient is lungs sound better today than they have in a long time.  I congratulated him on his weight loss.  His blood pressure today is acceptable at 128/72.  I would like to check an A1c and a lipid panel.  Ideally I would like his A1c below 6.5.  I discussed with him switching metformin  to Mounjaro to help facilitate additional weight loss.  He will consider this however he did not tolerate Ozempic .

## 2023-10-31 ENCOUNTER — Ambulatory Visit: Payer: Self-pay | Admitting: Family Medicine

## 2023-10-31 LAB — CBC WITH DIFFERENTIAL/PLATELET
Absolute Lymphocytes: 2726 {cells}/uL (ref 850–3900)
Absolute Monocytes: 787 {cells}/uL (ref 200–950)
Basophils Absolute: 10 {cells}/uL (ref 0–200)
Basophils Relative: 0.1 %
Eosinophils Absolute: 0 {cells}/uL — ABNORMAL LOW (ref 15–500)
Eosinophils Relative: 0 %
HCT: 42.4 % (ref 38.5–50.0)
Hemoglobin: 13.7 g/dL (ref 13.2–17.1)
MCH: 30.2 pg (ref 27.0–33.0)
MCHC: 32.3 g/dL (ref 32.0–36.0)
MCV: 93.6 fL (ref 80.0–100.0)
MPV: 10.5 fL (ref 7.5–12.5)
Monocytes Relative: 8.2 %
Neutro Abs: 6077 {cells}/uL (ref 1500–7800)
Neutrophils Relative %: 63.3 %
Platelets: 284 Thousand/uL (ref 140–400)
RBC: 4.53 Million/uL (ref 4.20–5.80)
RDW: 13.3 % (ref 11.0–15.0)
Total Lymphocyte: 28.4 %
WBC: 9.6 Thousand/uL (ref 3.8–10.8)

## 2023-10-31 LAB — COMPREHENSIVE METABOLIC PANEL WITH GFR
AG Ratio: 1.9 (calc) (ref 1.0–2.5)
ALT: 13 U/L (ref 9–46)
AST: 17 U/L (ref 10–35)
Albumin: 4.3 g/dL (ref 3.6–5.1)
Alkaline phosphatase (APISO): 60 U/L (ref 35–144)
BUN/Creatinine Ratio: 15 (calc) (ref 6–22)
BUN: 22 mg/dL (ref 7–25)
CO2: 27 mmol/L (ref 20–32)
Calcium: 9.6 mg/dL (ref 8.6–10.3)
Chloride: 103 mmol/L (ref 98–110)
Creat: 1.49 mg/dL — ABNORMAL HIGH (ref 0.70–1.22)
Globulin: 2.3 g/dL (ref 1.9–3.7)
Glucose, Bld: 81 mg/dL (ref 65–99)
Potassium: 4.9 mmol/L (ref 3.5–5.3)
Sodium: 141 mmol/L (ref 135–146)
Total Bilirubin: 0.5 mg/dL (ref 0.2–1.2)
Total Protein: 6.6 g/dL (ref 6.1–8.1)
eGFR: 46 mL/min/1.73m2 — ABNORMAL LOW (ref >=60)

## 2023-10-31 LAB — LIPID PANEL
Cholesterol: 136 mg/dL (ref <200)
HDL: 42 mg/dL (ref >=40)
LDL Cholesterol (Calc): 74 mg/dL
Non-HDL Cholesterol (Calc): 94 mg/dL (ref <130)
Total CHOL/HDL Ratio: 3.2 (calc) (ref <5.0)
Triglycerides: 121 mg/dL (ref <150)

## 2023-10-31 LAB — HEMOGLOBIN A1C
Hgb A1c MFr Bld: 6 % — ABNORMAL HIGH (ref <5.7)
Mean Plasma Glucose: 126 mg/dL
eAG (mmol/L): 7 mmol/L

## 2023-11-15 ENCOUNTER — Other Ambulatory Visit: Payer: Self-pay | Admitting: Family Medicine

## 2023-11-17 NOTE — Telephone Encounter (Signed)
 Requested Prescriptions  Pending Prescriptions Disp Refills   losartan  (COZAAR ) 50 MG tablet [Pharmacy Med Name: Losartan  Potassium 50 MG Oral Tablet] 90 tablet 1    Sig: Take 1 tablet by mouth once daily     Cardiovascular:  Angiotensin Receptor Blockers Failed - 11/17/2023  2:39 PM      Failed - Cr in normal range and within 180 days    Creat  Date Value Ref Range Status  10/30/2023 1.49 (H) 0.70 - 1.22 mg/dL Final   Creatinine, Urine  Date Value Ref Range Status  02/11/2023 137 20 - 320 mg/dL Final         Passed - K in normal range and within 180 days    Potassium  Date Value Ref Range Status  10/30/2023 4.9 3.5 - 5.3 mmol/L Final         Passed - Patient is not pregnant      Passed - Last BP in normal range    BP Readings from Last 1 Encounters:  10/30/23 128/72         Passed - Valid encounter within last 6 months    Recent Outpatient Visits           2 weeks ago Diabetes mellitus without complication [facility])   [place] [facility] Duanne Butler DASEN, MD   7 months ago Viral URI with cough   [place] [facility] Kayla Jeoffrey RAMAN, FNP   9 months ago Diabetes mellitus without complication [facility])   [place] [facility] [name], Butler DASEN, MD   1 year ago Left sided sciatica   [place] [facility] Duanne, Butler DASEN, MD   1 year ago Cellulitis of left lower extremity   [place] [facility] [name], Butler DASEN, MD               omeprazole  (PRILOSEC) 40 MG capsule [Pharmacy Med Name: Omeprazole  40 MG Oral Capsule Delayed Release] 90 capsule 1    Sig: Take 1 capsule by mouth once daily     Gastroenterology: Proton Pump Inhibitors Passed - 11/17/2023  2:39 PM      Passed - Valid encounter within last 12 months    Recent Outpatient Visits           2 weeks ago Diabetes mellitus without complication [facility])   [place] [facility] Duanne, Butler DASEN, MD   7  months ago Viral URI with cough   [place] [facility] Kayla Jeoffrey RAMAN, FNP   9 months ago Diabetes mellitus without complication [facility])   [place] [facility] Duanne, Butler DASEN, MD   1 year ago Left sided sciatica   [place] [facility] Duanne Butler DASEN, MD   1 year ago Cellulitis of left lower extremity   [place] [facility] [name], Butler DASEN, MD

## 2023-11-18 ENCOUNTER — Telehealth: Payer: Self-pay

## 2023-11-18 NOTE — Telephone Encounter (Signed)
 Copied from CRM #[id number]. Topic: General - Call Back - No Documentation >> Nov 18, 2023  4:34 PM DeAngela L wrote: Reason for CRM: patient returning missed about lab results and patient was on vacation when he missed the call he received Dr Duanne and would like call back about medication switch questions  ( metformin  to [facility] for diabetes management and weight loss) Pt num [phone] (M)

## 2023-11-19 ENCOUNTER — Telehealth: Payer: Self-pay

## 2023-11-19 ENCOUNTER — Ambulatory Visit

## 2023-11-19 VITALS — Ht 66.0 in | Wt 234.0 lb

## 2023-11-19 DIAGNOSIS — Z0000 Encounter for general adult medical examination without abnormal findings: Secondary | ICD-10-CM | POA: Diagnosis not present

## 2023-11-19 NOTE — Patient Instructions (Signed)
 Mr. William Howell , Thank you for taking time out of your busy schedule to complete your Annual Wellness Visit with me. I enjoyed our conversation and look forward to speaking with you again next year. I, as well as your care team,  appreciate your ongoing commitment to your health goals. Please review the following plan we discussed and let me know if I can assist you in the future. Your Game plan/ To Do List      Follow up Visits: We will see or speak with you next year for your Next Medicare AWV with our clinical staff Have you seen your provider in the last 6 months (3 months if uncontrolled diabetes)? Yes  Clinician Recommendations:  Aim for 30 minutes of exercise or brisk walking, 6-8 glasses of water, and 5 servings of fruits and vegetables each day.       This is a list of the screenings recommended for you:  Health Maintenance  Topic Date Due   Yearly kidney health urinalysis for diabetes  Never done   Complete foot exam   07/25/2020   COVID-19 Vaccine (3 - 2024-25 season) 12/15/2022   Flu Shot  11/14/2023   Hemoglobin A1C  05/01/2024   Eye exam for diabetics  07/01/2024   Yearly kidney function blood test for diabetes  10/29/2024   Medicare Annual Wellness Visit  11/18/2024   Pneumococcal Vaccine for age over 5  Completed   Zoster (Shingles) Vaccine  Completed   Hepatitis B Vaccine  Aged Out   HPV Vaccine  Aged Out   Meningitis B Vaccine  Aged Out   DTaP/Tdap/Td vaccine  Discontinued    Advanced directives: (ACP Link)Information on Advanced Care Planning can be found at [employer]  Best boy Advance Health Care Directives Advance Health Care Directives. [email or web address]   Advance Care Planning is important because it:  [x]  Makes sure you receive the medical care that is consistent with your values, goals, and preferences  [x]  It provides guidance to your family and loved ones and reduces their decisional burden about whether or not they are making the right decisions  based on your wishes.  Follow the link provided in your after visit summary or read over the paperwork we have mailed to you to help you started getting your Advance Directives in place. If you need assistance in completing these, please reach out to us  so that we can help you!  See attachments for Preventive Care and Fall Prevention Tips.

## 2023-11-19 NOTE — Telephone Encounter (Signed)
 Patient seen for AWV and states that recently with increased time sitting on tractor he has started to have problems with prostate.  C/o incomplete bladder emptying and nocturia.  He is wondering if there are any recommendations.

## 2023-11-19 NOTE — Progress Notes (Signed)
 Subjective:   William Howell is a 83 y.o. who presents for a Medicare Wellness preventive visit.  As a reminder, Annual Wellness Visits don't include a physical exam, and some assessments may be limited, especially if this visit is performed virtually. We may recommend an in-person follow-up visit with your provider if needed.  Visit Complete: Virtual I connected with  PIER BOSHER on 11/19/23 by a audio enabled telemedicine application and verified that I am speaking with the correct person using two identifiers.  Patient Location: Home  Provider Location: Home Office  I discussed the limitations of evaluation and management by telemedicine. The patient expressed understanding and agreed to proceed.  Vital Signs: Because this visit was a virtual/telehealth visit, some criteria may be missing or patient reported. Any vitals not documented were not able to be obtained and vitals that have been documented are patient reported.  VideoDeclined- This patient declined Librarian, academic. Therefore the visit was completed with audio only.  Persons Participating in Visit: Patient.  AWV Questionnaire: No: Patient Medicare AWV questionnaire was not completed prior to this visit.  Cardiac Risk Factors include: advanced age (>57men, >42 women);diabetes mellitus;male gender     Objective:    Today's Vitals   11/19/23 0854  Weight: 234 lb (106.1 kg)  Height: 5' 6 (1.676 m)   Body mass index is 37.77 kg/m.     11/19/2023    9:25 AM 10/05/2021    8:22 AM 08/11/2020    8:30 AM 01/16/2019    3:41 PM 07/07/2017    8:30 AM  Advanced Directives  Does Patient Have a Medical Advance Directive? No Yes No No No   Type of Special educational needs teacher of [place];Living will     Copy of Healthcare Power of Attorney in Chart?  No - copy requested     Would patient like information on creating a medical advance directive? Yes (MAU/Ambulatory/Procedural  Areas - Information given)  No - Patient declined       Data saved with a previous flowsheet row definition    Current Medications (verified) Outpatient Encounter Medications as of 11/19/2023  Medication Sig   benralizumab  (FASENRA  PEN) 30 MG/ML prefilled autoinjector Inject 30mg  into the skin at Week 0, Week 4   benralizumab  (FASENRA  PEN) 30 MG/ML prefilled autoinjector Inject 30mg  into the skin at Week 8 and every 8 weeks   Budeson-Glycopyrrol-Formoterol  (BREZTRI  AEROSPHERE) 160-9-4.8 MCG/ACT AERO Inhale 2 puffs into the lungs 2 (two) times daily.   cetirizine  (ZYRTEC ) 10 MG tablet Take 1 tablet by mouth once daily   losartan  (COZAAR ) 50 MG tablet Take 1 tablet by mouth once daily   metFORMIN  (GLUCOPHAGE ) 500 MG tablet TAKE 1 TABLET BY MOUTH TWICE DAILY WITH A MEAL   Multiple Vitamins-Minerals (MULTIVITAMIN WITH MINERALS) tablet Take 1 tablet daily by mouth.   Nintedanib (OFEV ) 100 MG CAPS Take 1 capsule (100 mg total) by mouth 2 (two) times daily.   omeprazole  (PRILOSEC) 40 MG capsule Take 1 capsule by mouth once daily   tamsulosin  (FLOMAX ) 0.4 MG CAPS capsule Take 1 capsule by mouth once daily   VENTOLIN  HFA 108 (90 Base) MCG/ACT inhaler Inhale 2 puffs into the lungs every 6 (six) hours as needed for wheezing or shortness of breath.   zafirlukast  (ACCOLATE ) 20 MG tablet Take 1 tablet (20 mg total) by mouth 2 (two) times daily.   No facility-administered encounter medications on file as of 11/19/2023.    Allergies (verified) Patient  has no known allergies.   History: Past Medical History:  Diagnosis Date   Allergy     Asthma    Diabetes mellitus type II, controlled (HCC)    GERD (gastroesophageal reflux disease)    pt states he takes omeprazole  but doesnt have GERD   Hypertension    Obesity    Sleep apnea    uses CPAP   Past Surgical History:  Procedure Laterality Date   CHOLECYSTECTOMY     DENTAL SURGERY     Family History  Problem Relation Age of Onset   Heart disease  Father    Heart attack Father    Heart attack Sister    Colon cancer Neg Hx    Colon polyps Neg Hx    Esophageal cancer Neg Hx    Rectal cancer Neg Hx    Stomach cancer Neg Hx    Pancreatic cancer Neg Hx    Liver cancer Neg Hx    Prostate cancer Neg Hx    Social History   Socioeconomic History   Marital status: Married    Spouse name: Channing   Number of children: 5   Years of education: Not on file   Highest education level: 12th grade  Occupational History   Not on file  Tobacco Use   Smoking status: Never   Smokeless tobacco: Former    Types: Engineer, drilling   Vaping status: Never Used  Substance and Sexual Activity   Alcohol use: Yes    Comment: occasional    Drug use: No   Sexual activity: Not Currently  Other Topics Concern   Not on file  Social History Narrative   3 children, 2 stepchildren   12 grandchildren   7 great grandchildren.   Retired Psychologist, forensic.    Social Drivers of Corporate investment banker Strain: Low Risk  (11/19/2023)   Overall Financial Resource Strain (CARDIA)    Difficulty of Paying Living Expenses: Not hard at all  Food Insecurity: No Food Insecurity (11/19/2023)   Hunger Vital Sign    Worried About Running Out of Food in the Last Year: Never true    Ran Out of Food in the Last Year: Never true  Transportation Needs: No Transportation Needs (11/19/2023)   PRAPARE - Administrator, Civil Service (Medical): No    Lack of Transportation (Non-Medical): No  Physical Activity: Sufficiently Active (11/19/2023)   Exercise Vital Sign    Days of Exercise per Week: 6 days    Minutes of Exercise per Session: 60 min  Stress: No Stress Concern Present (11/19/2023)   [employer] of Occupational Health - Occupational Stress Questionnaire    Feeling of Stress: Only a little  Social Connections: Moderately Isolated (11/19/2023)   Social Connection and Isolation Panel    Frequency of Communication with Friends and Family: More  than three times a week    Frequency of Social Gatherings with Friends and Family: More than three times a week    Attends Religious Services: Patient declined    Database administrator or Organizations: No    Attends Engineer, structural: Never    Marital Status: Married    Tobacco Counseling Counseling given: Not Answered    Clinical Intake:  Pre-visit preparation completed: Yes  Pain : No/denies pain  Diabetes: Yes CBG done?: No Did pt. bring in CBG monitor from home?: No  Lab Results  Component Value Date   HGBA1C 6.0 (H) 10/30/2023  HGBA1C 6.4 (H) 02/11/2023   HGBA1C 6.3 (H) 06/17/2022     How often do you need to have someone help you when you read instructions, pamphlets, or other written materials from your doctor or pharmacy?: 1 - Never  Interpreter Needed?: No  Information entered by :: Charmaine Bloodgood LPN   Activities of Daily Living     11/19/2023    8:54 AM  In your present state of health, do you have any difficulty performing the following activities:  Hearing? 0  Vision? 0  Difficulty concentrating or making decisions? 0  Walking or climbing stairs? 0  Dressing or bathing? 0  Doing errands, shopping? 0  Preparing Food and eating ? N  Using the Toilet? N  In the past six months, have you accidently leaked urine? N  Do you have problems with loss of bowel control? N  Managing your Medications? N  Managing your Finances? N  Housekeeping or managing your Housekeeping? N    Patient Care Team: Duanne Butler DASEN, MD as PCP - General (Family Medicine) Mona Vinie BROCKS, MD as PCP - Cardiology (Cardiology) Mona Vinie BROCKS, MD as Consulting Physician (Cardiology) Geronimo Amel, MD as Consulting Physician (Pulmonary Disease) Charmayne Molly, MD as Consulting Physician (Ophthalmology)  I have updated your Care Teams any recent Medical Services you may have received from other providers in the past year.     Assessment:   This is a  routine wellness examination for [place].  Hearing/Vision screen Hearing Screening - Comments:: Some hearing loss; no hearing aids   Vision Screening - Comments:: Wears rx glasses - up to date with routine eye exams with [facility] Dr. Charmayne    Goals Addressed             This Visit's Progress    Exercise 3x per week (30 min per time)   On track    10/05/21-Increase exercise as tolerated.        Depression Screen     11/19/2023    9:23 AM 04/02/2023    3:40 PM 02/11/2023   11:14 AM 10/05/2021    8:19 AM 08/11/2020    8:35 AM 07/26/2019    8:22 AM 05/04/2018    8:29 AM  PHQ 2/9 Scores  PHQ - 2 Score 0 0 0 0 0 0 0  PHQ- 9 Score  5         Fall Risk     11/19/2023    9:26 AM 02/11/2023   11:14 AM 03/11/2022   10:02 AM 10/05/2021    8:23 AM 08/11/2020    8:34 AM  Fall Risk   Falls in the past year? 0 0 0 0 1  Number falls in past yr: 0 0  0 0  Injury with Fall? 0 0  0 0  Risk for fall due to : No Fall Risks No Fall Risks  No Fall Risks No Fall Risks  Follow up Falls prevention discussed;Education provided;Falls evaluation completed Falls prevention discussed  Falls prevention discussed  Falls evaluation completed;Falls prevention discussed      Data saved with a previous flowsheet row definition    MEDICARE RISK AT HOME:  Medicare Risk at Home Any stairs in or around the home?: No If so, are there any without handrails?: No Home free of loose throw rugs in walkways, pet beds, electrical cords, etc?: Yes Adequate lighting in your home to reduce risk of falls?: Yes Life alert?: No Use of a cane, walker or w/c?:  No Grab bars in the bathroom?: Yes Shower chair or bench in shower?: No Elevated toilet seat or a handicapped toilet?: Yes  TIMED UP AND GO:  Was the test performed?  No  Cognitive Function: Declined/Normal: No cognitive concerns noted by patient or family. Patient alert, oriented, able to answer questions appropriately and recall recent events.  No signs of memory loss or confusion.        10/05/2021    8:25 AM  6CIT Screen  What Year? 0 points  What month? 0 points  What time? 0 points  Count back from 20 0 points  Months in reverse 0 points  Repeat phrase 0 points  Total Score 0 points    Immunizations Immunization History  Administered Date(s) Administered   Fluad Quad(high Dose 65+) 01/04/2019, 03/27/2020, 04/22/2022   Fluad Trivalent(High Dose 65+) 02/11/2023   Influenza, High Dose Seasonal PF 02/17/2017   Influenza,inj,Quad PF,6+ Mos 02/10/2014, 03/14/2015, 01/09/2016, 02/12/2018   Influenza-Unspecified 02/12/2018   PFIZER(Purple Top)SARS-COV-2 Vaccination 07/10/2019, 08/04/2019   Pneumococcal Conjugate-13 07/04/2014   Pneumococcal Polysaccharide-23 12/26/2011   Td 12/26/2011   Tdap 12/26/2011, 08/15/2022   Zoster Recombinant(Shingrix) 02/20/2021, 04/18/2021   Zoster, Live 02/10/2014    Screening Tests Health Maintenance  Topic Date Due   Diabetic kidney evaluation - Urine ACR  Never done   FOOT EXAM  07/25/2020   COVID-19 Vaccine (3 - 2024-25 season) 12/15/2022   INFLUENZA VACCINE  11/14/2023   HEMOGLOBIN A1C  05/01/2024   OPHTHALMOLOGY EXAM  07/01/2024   Diabetic kidney evaluation - eGFR measurement  10/29/2024   Medicare Annual Wellness (AWV)  11/18/2024   Pneumococcal Vaccine: 50+ Years  Completed   Zoster Vaccines- Shingrix  Completed   Hepatitis B Vaccines  Aged Out   HPV VACCINES  Aged Out   Meningococcal B Vaccine  Aged Out   DTaP/Tdap/Td  Discontinued    Health Maintenance  Health Maintenance Due  Topic Date Due   Diabetic kidney evaluation - Urine ACR  Never done   FOOT EXAM  07/25/2020   COVID-19 Vaccine (3 - 2024-25 season) 12/15/2022   INFLUENZA VACCINE  11/14/2023    Additional Screening:  Vision Screening: Recommended annual ophthalmology exams for early detection of glaucoma and other disorders of the eye. Would you like a referral to an eye doctor? No    Dental  Screening: Recommended annual dental exams for proper oral hygiene  Community Resource Referral / Chronic Care Management: CRR required this visit?  No   CCM required this visit?  No   Plan:    I have personally reviewed and noted the following in the patient's chart:   Medical and social history Use of alcohol, tobacco or illicit drugs  Current medications and supplements including opioid prescriptions. Patient is not currently taking opioid prescriptions. Functional ability and status Nutritional status Physical activity Advanced directives List of other physicians Hospitalizations, surgeries, and ER visits in previous 12 months Vitals Screenings to include cognitive, depression, and falls Referrals and appointments  In addition, I have reviewed and discussed with patient certain preventive protocols, quality metrics, and best practice recommendations. A written personalized care plan for preventive services as well as general preventive health recommendations were provided to patient.   Lavelle Pfeiffer [place], [place]   04/17/7972   After Visit Summary: (MyChart) Due to this being a telephonic visit, the after visit summary with patients personalized plan was offered to patient via MyChart   Notes: Nothing significant to report at this time.

## 2023-12-03 ENCOUNTER — Other Ambulatory Visit: Payer: Self-pay | Admitting: Family Medicine

## 2023-12-04 ENCOUNTER — Other Ambulatory Visit: Payer: Self-pay | Admitting: Family Medicine

## 2023-12-04 NOTE — Telephone Encounter (Signed)
 Requested medications are due for refill today.  yes  Requested medications are on the active medications list.  yes  Last refill. 09/04/2023 #90 0 rf  Future visit scheduled.   yes  Notes to clinic.  Expired labs.    Requested Prescriptions  Pending Prescriptions Disp Refills   tamsulosin  (FLOMAX ) 0.4 MG CAPS capsule [Pharmacy Med Name: Tamsulosin  HCl 0.4 MG Oral Capsule] 90 capsule 0    Sig: Take 1 capsule by mouth once daily     Urology: Alpha-Adrenergic Blocker Failed - 12/04/2023  1:20 PM      Failed - PSA in normal range and within 360 days    PSA  Date Value Ref Range Status  05/01/2021 4.35 (H) < OR = 4.00 ng/mL Final    Comment:    The total PSA value from this assay system is  standardized against the WHO standard. The test  result will be approximately 20% lower when compared  to the equimolar-standardized total PSA (Beckman  Coulter). Comparison of serial PSA results should be  interpreted with this fact in mind. . This test was performed using the Siemens  chemiluminescent method. Values obtained from  different assay methods cannot be used interchangeably. PSA levels, regardless of value, should not be interpreted as absolute evidence of the presence or absence of disease.          Passed - Last BP in normal range    BP Readings from Last 1 Encounters:  10/30/23 128/72         Passed - Valid encounter within last 12 months    Recent Outpatient Visits           1 month ago Diabetes mellitus without complication [facility])   [place] [facility] Duanne, Butler DASEN, MD   8 months ago Viral URI with cough   [facility] Kayla Jeoffrey RAMAN, FNP   9 months ago Diabetes mellitus without complication [facility])   [place] [facility] Duanne Butler DASEN, MD   1 year ago Left sided sciatica   [place] [facility] Duanne Butler DASEN, MD   1 year ago Cellulitis of left lower extremity    [place] [facility] [name], Butler DASEN, MD

## 2023-12-05 NOTE — Telephone Encounter (Signed)
 Requested Prescriptions  Pending Prescriptions Disp Refills   cetirizine  (ZYRTEC ) 10 MG tablet [Pharmacy Med Name: Cetirizine  HCl 10 MG Oral Tablet] 90 tablet 0    Sig: Take 1 tablet by mouth once daily     Ear, Nose, and Throat:  Antihistamines 2 Failed - 12/05/2023  1:58 PM      Failed - Cr in normal range and within 360 days    Creat  Date Value Ref Range Status  10/30/2023 1.49 (H) 0.70 - 1.22 mg/dL Final   Creatinine, Urine  Date Value Ref Range Status  02/11/2023 137 20 - 320 mg/dL Final         Passed - Valid encounter within last 12 months    Recent Outpatient Visits           1 month ago Diabetes mellitus without complication [facility])   [place] [facility] [name], Butler DASEN, MD   8 months ago Viral URI with cough   [place] [facility] Kayla Jeoffrey RAMAN, FNP   9 months ago Diabetes mellitus without complication [facility])   [place] [facility] Duanne, Butler DASEN, MD   1 year ago Left sided sciatica   [place] [facility] Duanne Butler DASEN, MD   1 year ago Cellulitis of left lower extremity   [place] [facility] Medicine [name], Butler DASEN, MD

## 2023-12-19 ENCOUNTER — Encounter: Payer: Self-pay | Admitting: Family Medicine

## 2023-12-19 ENCOUNTER — Ambulatory Visit: Payer: Self-pay | Admitting: *Deleted

## 2023-12-19 ENCOUNTER — Ambulatory Visit: Admitting: Family Medicine

## 2023-12-19 VITALS — BP 116/56 | HR 83 | Temp 98.4°F | Ht 66.0 in | Wt 236.0 lb

## 2023-12-19 DIAGNOSIS — N410 Acute prostatitis: Secondary | ICD-10-CM

## 2023-12-19 DIAGNOSIS — R300 Dysuria: Secondary | ICD-10-CM

## 2023-12-19 LAB — URINALYSIS, ROUTINE W REFLEX MICROSCOPIC
Glucose, UA: NEGATIVE
Hyaline Cast: NONE SEEN /LPF
Nitrite: NEGATIVE
Specific Gravity, Urine: 1.02 (ref 1.001–1.035)
pH: 5.5 (ref 5.0–8.0)

## 2023-12-19 LAB — MICROSCOPIC MESSAGE

## 2023-12-19 MED ORDER — SULFAMETHOXAZOLE-TRIMETHOPRIM 800-160 MG PO TABS: 1.0000 | ORAL_TABLET | Freq: Two times a day (BID) | ORAL | 0 refills | Status: DC

## 2023-12-19 NOTE — Telephone Encounter (Signed)
 Copied from CRM [id number]. Topic: Clinical - Red Word Triage >> Dec 19, 2023  7:41 AM Willma R wrote: [facility] that prompted transfer to Nurse Triage: Patient thinks he may have a UTI. 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 Also has burning while urinating. Reason for Disposition  Side (flank) or lower back pain present  Answer Assessment - Initial Assessment Questions 1. SYMPTOM: What's the main symptom you're concerned about? (e.g., frequency, incontinence)     Wife, Channing calling in Having burning with urination.   Yesterday afternoon he felt bad.  Had chills.   Temperature 101.5.   7:30 PM last night 104 forehead.    I gave him ibuprofen.   It brought his fever down but still low grade fever. Fever 98 this morning.    2. ONSET: When did the  burning  start?     Yesterday 9/4.      3. PAIN: Is there any pain? If Yes, ask: How bad is it? (Scale: 1-10; mild, moderate, severe)     Yes 4. CAUSE: What do you think is causing the symptoms?     UTI 5. OTHER SYMPTOMS: Do you have any other symptoms? (e.g., blood in urine, fever, flank pain, pain with urination)     A little flank pain right side yesterday.    No blood in urine. 6. PREGNANCY: Is there any chance you are pregnant? When was your last menstrual period?     N/A  Protocols used: Urinary Symptoms-A-AH FYI Only or Action Required?: FYI only for provider.  Patient was last seen in primary care on 10/30/2023 by Duanne Butler DASEN, MD.  Called Nurse Triage reporting Dysuria. Burning, fever and chills Symptoms began yesterday.  Interventions attempted: Nothing.  Symptoms are: rapidly worsening. Mild flank pain on right side yesterday.    Triage Disposition: See HCP Within 4 Hours (Or PCP Triage)  Patient/caregiver understands and will follow disposition?: Yes

## 2023-12-19 NOTE — Progress Notes (Signed)
 Subjective:    Patient ID: William Howell, male    DOB: May 02, 1940, 83 y.o.   MRN: [id number]  Patient has been riding in a tractor for the last week.  This involves sitting for prolonged periods of time and bouncing around on uneven terrain.  Starting yesterday, he developed a fever to 104.  He states that over the last few days he is also noticing dysuria.  He also reports increased urinary frequency and urgency.  He has chronic dribbling.  He states that the last time he felt like this he had a urinary tract infection.  He denies any cough.  He denies any runny nose.  He denies any sore throat.  He denies any sinus pain.  He denies any rash or tick bites.  Urinalysis today shows trace blood, negative nitrates, and only +1 leukocyte esterase.  His prostate on exam is slightly swollen and mildly tender to palpation Past Medical History:  Diagnosis Date   Allergy     Asthma    Diabetes mellitus type II, controlled (HCC)    GERD (gastroesophageal reflux disease)    pt states he takes omeprazole  but doesnt have GERD   Hypertension    Obesity    Sleep apnea    uses CPAP   Past Surgical History:  Procedure Laterality Date   CHOLECYSTECTOMY     DENTAL SURGERY     Current Outpatient Medications on File Prior to Visit  Medication Sig Dispense Refill   benralizumab  (FASENRA  PEN) 30 MG/ML prefilled autoinjector Inject 30mg  into the skin at Week 0, Week 4 1 mL 1   benralizumab  (FASENRA  PEN) 30 MG/ML prefilled autoinjector Inject 30mg  into the skin at Week 8 and every 8 weeks 1 mL 1   Budeson-Glycopyrrol-Formoterol  (BREZTRI  AEROSPHERE) 160-9-4.8 MCG/ACT AERO Inhale 2 puffs into the lungs 2 (two) times daily. 10.7 g 11   cetirizine  (ZYRTEC ) 10 MG tablet Take 1 tablet by mouth once daily 90 tablet 0   losartan  (COZAAR ) 50 MG tablet Take 1 tablet by mouth once daily 90 tablet 1   metFORMIN  (GLUCOPHAGE ) 500 MG tablet TAKE 1 TABLET BY MOUTH TWICE DAILY WITH A MEAL 180 tablet 0   Multiple  Vitamins-Minerals (MULTIVITAMIN WITH MINERALS) tablet Take 1 tablet daily by mouth.     Nintedanib (OFEV ) 100 MG CAPS Take 1 capsule (100 mg total) by mouth 2 (two) times daily. 180 capsule 1   omeprazole  (PRILOSEC) 40 MG capsule Take 1 capsule by mouth once daily 90 capsule 1   tamsulosin  (FLOMAX ) 0.4 MG CAPS capsule Take 1 capsule by mouth once daily 90 capsule 0   VENTOLIN  HFA 108 (90 Base) MCG/ACT inhaler Inhale 2 puffs into the lungs every 6 (six) hours as needed for wheezing or shortness of breath. 18 g 0   zafirlukast  (ACCOLATE ) 20 MG tablet Take 1 tablet (20 mg total) by mouth 2 (two) times daily. 180 tablet 3   No current facility-administered medications on file prior to visit.   No Known Allergies Social History   Socioeconomic History   Marital status: Married    Spouse name: Channing   Number of children: 5   Years of education: Not on file   Highest education level: 12th grade  Occupational History   Not on file  Tobacco Use   Smoking status: Never   Smokeless tobacco: Former    Types: Engineer, drilling   Vaping status: Never Used  Substance and Sexual Activity   Alcohol use: Yes  Comment: occasional    Drug use: No   Sexual activity: Not Currently  Other Topics Concern   Not on file  Social History Narrative   3 children, 2 stepchildren   12 grandchildren   7 great grandchildren.   Retired Psychologist, forensic.    Social Drivers of Corporate investment banker Strain: Low Risk  (11/19/2023)   Overall Financial Resource Strain (CARDIA)    Difficulty of Paying Living Expenses: Not hard at all  Food Insecurity: No Food Insecurity (11/19/2023)   Hunger Vital Sign    Worried About Running Out of Food in the Last Year: Never true    Ran Out of Food in the Last Year: Never true  Transportation Needs: No Transportation Needs (11/19/2023)   PRAPARE - Administrator, Civil Service (Medical): No    Lack of Transportation (Non-Medical): No  Physical  Activity: Sufficiently Active (11/19/2023)   Exercise Vital Sign    Days of Exercise per Week: 6 days    Minutes of Exercise per Session: 60 min  Stress: No Stress Concern Present (11/19/2023)   [employer] of Occupational Health - Occupational Stress Questionnaire    Feeling of Stress: Only a little  Social Connections: Moderately Isolated (11/19/2023)   Social Connection and Isolation Panel    Frequency of Communication with Friends and Family: More than three times a week    Frequency of Social Gatherings with Friends and Family: More than three times a week    Attends Religious Services: Patient declined    Database administrator or Organizations: No    Attends Banker Meetings: Never    Marital Status: Married  Catering manager Violence: Not At Risk (11/19/2023)   Humiliation, Afraid, Rape, and Kick questionnaire    Fear of Current or Ex-Partner: No    Emotionally Abused: No    Physically Abused: No    Sexually Abused: No      Review of Systems  All other systems reviewed and are negative.      Objective:   Physical Exam Vitals reviewed.  Constitutional:      General: He is not in acute distress.    Appearance: He is well-developed. He is not diaphoretic.  HENT:     Head: Normocephalic and atraumatic.     Right Ear: Tympanic membrane, ear canal and external ear normal.     Left Ear: Tympanic membrane, ear canal and external ear normal.     Nose: No congestion or rhinorrhea.     Mouth/Throat:     Mouth: Mucous membranes are moist.     Pharynx: Oropharynx is clear. No oropharyngeal exudate or posterior oropharyngeal erythema.  Eyes:     General: No scleral icterus.       Right eye: No discharge.        Left eye: No discharge.     Conjunctiva/sclera: Conjunctivae normal.     Pupils: Pupils are equal, round, and reactive to light.  Neck:     Thyroid: No thyromegaly.     Vascular: No JVD.     Trachea: No tracheal deviation.  Cardiovascular:     Rate  and Rhythm: Normal rate and regular rhythm.     Heart sounds: Normal heart sounds. No murmur heard.    No friction rub. No gallop.  Pulmonary:     Effort: Pulmonary effort is normal. No accessory muscle usage, prolonged expiration or respiratory distress.     Breath sounds: No stridor  or decreased air movement. No wheezing, rhonchi or rales.  Chest:     Chest wall: No tenderness.  Genitourinary:    Prostate: Enlarged and tender.  Musculoskeletal:     Cervical back: Normal range of motion and neck supple.  Lymphadenopathy:     Cervical: No cervical adenopathy.  Skin:    General: Skin is warm.     Coloration: Skin is not pale.     Findings: No erythema or rash.  Neurological:     Mental Status: He is alert and oriented to person, place, and time.     Cranial Nerves: No cranial nerve deficit.     Motor: No abnormal muscle tone.     Coordination: Coordination normal.     Deep Tendon Reflexes: Reflexes are normal and symmetric.  Psychiatric:        Behavior: Behavior normal.        Thought Content: Thought content normal.        Judgment: Judgment normal.           Assessment & Plan:  Dysuria - Plan: Urinalysis, Routine w reflex microscopic  Acute prostatitis Patient I believe his acute prostatitis and I am concerned that due to his drop in blood pressure he may be getting severely ill with this.  I recommended he temporarily hold his losartan  and metformin .  Push fluids.  Start Bactrim  double strength tablets twice daily for 10 days.  Recheck next week or seek medical attention immediately if worsening.  Also recommended they take a home COVID test just in case although he has no upper respiratory symptoms

## 2023-12-22 ENCOUNTER — Inpatient Hospital Stay (HOSPITAL_BASED_OUTPATIENT_CLINIC_OR_DEPARTMENT_OTHER)
Admission: EM | Admit: 2023-12-22 | Discharge: 2023-12-24 | DRG: 660 | Disposition: A | Discharge status: Home / Self Care | Source: Ambulatory Visit | Attending: Family Medicine | Admitting: Family Medicine

## 2023-12-22 ENCOUNTER — Other Ambulatory Visit: Payer: Self-pay

## 2023-12-22 ENCOUNTER — Encounter (HOSPITAL_COMMUNITY): Payer: Self-pay | Admitting: Family Medicine

## 2023-12-22 ENCOUNTER — Ambulatory Visit (INDEPENDENT_AMBULATORY_CARE_PROVIDER_SITE_OTHER): Admitting: Family Medicine

## 2023-12-22 ENCOUNTER — Ambulatory Visit: Payer: Self-pay

## 2023-12-22 ENCOUNTER — Encounter: Payer: Self-pay | Admitting: Family Medicine

## 2023-12-22 ENCOUNTER — Emergency Department (HOSPITAL_BASED_OUTPATIENT_CLINIC_OR_DEPARTMENT_OTHER)

## 2023-12-22 VITALS — BP 100/62 | HR 77 | Temp 98.2°F | Ht 66.0 in | Wt 243.2 lb

## 2023-12-22 DIAGNOSIS — E1122 Type 2 diabetes mellitus with diabetic chronic kidney disease: Secondary | ICD-10-CM | POA: Diagnosis present

## 2023-12-22 DIAGNOSIS — Z6839 Body mass index (BMI) 39.0-39.9, adult: Secondary | ICD-10-CM | POA: Diagnosis not present

## 2023-12-22 DIAGNOSIS — N10 Acute tubulo-interstitial nephritis: Secondary | ICD-10-CM | POA: Diagnosis not present

## 2023-12-22 DIAGNOSIS — Z87891 Personal history of nicotine dependence: Secondary | ICD-10-CM

## 2023-12-22 DIAGNOSIS — N201 Calculus of ureter: Secondary | ICD-10-CM | POA: Diagnosis not present

## 2023-12-22 DIAGNOSIS — Z79899 Other long term (current) drug therapy: Secondary | ICD-10-CM | POA: Diagnosis not present

## 2023-12-22 DIAGNOSIS — E119 Type 2 diabetes mellitus without complications: Secondary | ICD-10-CM | POA: Diagnosis not present

## 2023-12-22 DIAGNOSIS — J45909 Unspecified asthma, uncomplicated: Secondary | ICD-10-CM | POA: Diagnosis present

## 2023-12-22 DIAGNOSIS — R5381 Other malaise: Secondary | ICD-10-CM | POA: Diagnosis present

## 2023-12-22 DIAGNOSIS — J849 Interstitial pulmonary disease, unspecified: Secondary | ICD-10-CM | POA: Diagnosis present

## 2023-12-22 DIAGNOSIS — N3289 Other specified disorders of bladder: Secondary | ICD-10-CM | POA: Diagnosis present

## 2023-12-22 DIAGNOSIS — J4540 Moderate persistent asthma, uncomplicated: Secondary | ICD-10-CM | POA: Diagnosis not present

## 2023-12-22 DIAGNOSIS — N390 Urinary tract infection, site not specified: Secondary | ICD-10-CM | POA: Diagnosis not present

## 2023-12-22 DIAGNOSIS — Z7984 Long term (current) use of oral hypoglycemic drugs: Secondary | ICD-10-CM

## 2023-12-22 DIAGNOSIS — N1831 Chronic kidney disease, stage 3a: Secondary | ICD-10-CM | POA: Diagnosis present

## 2023-12-22 DIAGNOSIS — I10 Essential (primary) hypertension: Secondary | ICD-10-CM | POA: Diagnosis not present

## 2023-12-22 DIAGNOSIS — R319 Hematuria, unspecified: Secondary | ICD-10-CM

## 2023-12-22 DIAGNOSIS — I959 Hypotension, unspecified: Secondary | ICD-10-CM | POA: Diagnosis present

## 2023-12-22 DIAGNOSIS — N179 Acute kidney failure, unspecified: Secondary | ICD-10-CM | POA: Diagnosis not present

## 2023-12-22 DIAGNOSIS — N136 Pyonephrosis: Principal | ICD-10-CM | POA: Diagnosis present

## 2023-12-22 DIAGNOSIS — I129 Hypertensive chronic kidney disease with stage 1 through stage 4 chronic kidney disease, or unspecified chronic kidney disease: Secondary | ICD-10-CM | POA: Diagnosis present

## 2023-12-22 DIAGNOSIS — Z8249 Family history of ischemic heart disease and other diseases of the circulatory system: Secondary | ICD-10-CM

## 2023-12-22 DIAGNOSIS — G4730 Sleep apnea, unspecified: Secondary | ICD-10-CM | POA: Diagnosis present

## 2023-12-22 DIAGNOSIS — N200 Calculus of kidney: Secondary | ICD-10-CM

## 2023-12-22 DIAGNOSIS — E861 Hypovolemia: Secondary | ICD-10-CM | POA: Diagnosis present

## 2023-12-22 DIAGNOSIS — E66812 Obesity, class 2: Secondary | ICD-10-CM | POA: Diagnosis present

## 2023-12-22 DIAGNOSIS — D6959 Other secondary thrombocytopenia: Secondary | ICD-10-CM | POA: Diagnosis present

## 2023-12-22 DIAGNOSIS — K4020 Bilateral inguinal hernia, without obstruction or gangrene, not specified as recurrent: Secondary | ICD-10-CM | POA: Diagnosis not present

## 2023-12-22 DIAGNOSIS — Z7951 Long term (current) use of inhaled steroids: Secondary | ICD-10-CM

## 2023-12-22 DIAGNOSIS — N202 Calculus of kidney with calculus of ureter: Secondary | ICD-10-CM | POA: Diagnosis not present

## 2023-12-22 DIAGNOSIS — D649 Anemia, unspecified: Secondary | ICD-10-CM | POA: Diagnosis present

## 2023-12-22 DIAGNOSIS — N12 Tubulo-interstitial nephritis, not specified as acute or chronic: Principal | ICD-10-CM

## 2023-12-22 DIAGNOSIS — R509 Fever, unspecified: Secondary | ICD-10-CM | POA: Diagnosis not present

## 2023-12-22 DIAGNOSIS — Z9049 Acquired absence of other specified parts of digestive tract: Secondary | ICD-10-CM | POA: Diagnosis not present

## 2023-12-22 DIAGNOSIS — K219 Gastro-esophageal reflux disease without esophagitis: Secondary | ICD-10-CM | POA: Diagnosis present

## 2023-12-22 DIAGNOSIS — G4733 Obstructive sleep apnea (adult) (pediatric): Secondary | ICD-10-CM | POA: Diagnosis present

## 2023-12-22 DIAGNOSIS — N132 Hydronephrosis with renal and ureteral calculous obstruction: Secondary | ICD-10-CM | POA: Diagnosis not present

## 2023-12-22 DIAGNOSIS — N419 Inflammatory disease of prostate, unspecified: Secondary | ICD-10-CM | POA: Diagnosis not present

## 2023-12-22 DIAGNOSIS — R109 Unspecified abdominal pain: Secondary | ICD-10-CM | POA: Diagnosis not present

## 2023-12-22 LAB — COMPREHENSIVE METABOLIC PANEL WITH GFR
ALT: 31 U/L (ref 0–44)
AST: 32 U/L (ref 15–41)
Albumin: 3.7 g/dL (ref 3.5–5.0)
Alkaline Phosphatase: 125 U/L (ref 38–126)
Anion gap: 15 (ref 5–15)
BUN: 38 mg/dL — ABNORMAL HIGH (ref 8–23)
CO2: 22 mmol/L (ref 22–32)
Calcium: 9.4 mg/dL (ref 8.9–10.3)
Chloride: 98 mmol/L (ref 98–111)
Creatinine, Ser: 2.95 mg/dL — ABNORMAL HIGH (ref 0.61–1.24)
GFR, Estimated: 20 mL/min — ABNORMAL LOW (ref >60)
Glucose, Bld: 103 mg/dL — ABNORMAL HIGH (ref 70–99)
Potassium: 4.3 mmol/L (ref 3.5–5.1)
Sodium: 135 mmol/L (ref 135–145)
Total Bilirubin: 0.4 mg/dL (ref 0.0–1.2)
Total Protein: 6.8 g/dL (ref 6.5–8.1)

## 2023-12-22 LAB — CBC WITH DIFFERENTIAL/PLATELET
Abs Immature Granulocytes: 0.08 K/uL — ABNORMAL HIGH (ref 0.00–0.07)
Basophils Absolute: 0 K/uL (ref 0.0–0.1)
Basophils Relative: 0 %
Eosinophils Absolute: 0 K/uL (ref 0.0–0.5)
Eosinophils Relative: 0 %
HCT: 37.8 % — ABNORMAL LOW (ref 39.0–52.0)
Hemoglobin: 12.6 g/dL — ABNORMAL LOW (ref 13.0–17.0)
Immature Granulocytes: 1 %
Lymphocytes Relative: 6 %
Lymphs Abs: 0.9 K/uL (ref 0.7–4.0)
MCH: 29.9 pg (ref 26.0–34.0)
MCHC: 33.3 g/dL (ref 30.0–36.0)
MCV: 89.6 fL (ref 80.0–100.0)
Monocytes Absolute: 0.8 K/uL (ref 0.1–1.0)
Monocytes Relative: 6 %
Neutro Abs: 12.3 K/uL — ABNORMAL HIGH (ref 1.7–7.7)
Neutrophils Relative %: 87 %
Platelets: 128 K/uL — ABNORMAL LOW (ref 150–400)
RBC: 4.22 MIL/uL (ref 4.22–5.81)
RDW: 14.2 % (ref 11.5–15.5)
WBC: 14 K/uL — ABNORMAL HIGH (ref 4.0–10.5)
nRBC: 0 % (ref 0.0–0.2)

## 2023-12-22 LAB — URINALYSIS, W/ REFLEX TO CULTURE (INFECTION SUSPECTED)
Bacteria, UA: NONE SEEN
Bilirubin Urine: NEGATIVE
Glucose, UA: NEGATIVE mg/dL
Ketones, ur: NEGATIVE mg/dL
Nitrite: NEGATIVE
Protein, ur: NEGATIVE mg/dL
RBC / HPF: >50 RBC/hpf (ref 0–5)
Specific Gravity, Urine: 1.01 (ref 1.005–1.030)
WBC, UA: >50 WBC/hpf (ref 0–5)
pH: 5.5 (ref 5.0–8.0)

## 2023-12-22 LAB — LACTIC ACID, PLASMA
Lactic Acid, Venous: 1.7 mmol/L (ref 0.5–1.9)
Lactic Acid, Venous: 1.1 mmol/L (ref 0.5–1.9)

## 2023-12-22 MED ORDER — INSULIN ASPART 100 UNIT/ML IJ SOLN: 0.0000 [IU] | INTRAMUSCULAR | Status: DC

## 2023-12-22 MED ORDER — MONTELUKAST SODIUM 10 MG PO TABS
10.0000 mg | ORAL_TABLET | Freq: Every day | ORAL | Status: DC
  Administered 2023-12-22 – 2023-12-23 (×2): 10 mg via ORAL
  Filled 2023-12-22 (×2): qty 1

## 2023-12-22 MED ORDER — FENTANYL CITRATE PF 50 MCG/ML IJ SOSY: 12.5000 ug | PREFILLED_SYRINGE | INTRAMUSCULAR | Status: DC | PRN

## 2023-12-22 MED ORDER — ACETAMINOPHEN 325 MG PO TABS: 650.0000 mg | ORAL_TABLET | Freq: Four times a day (QID) | ORAL | Status: DC | PRN

## 2023-12-22 MED ORDER — SODIUM CHLORIDE 0.9% FLUSH
3.0000 mL | Freq: Two times a day (BID) | INTRAVENOUS | Status: DC
  Administered 2023-12-22 – 2023-12-23 (×3): 3 mL via INTRAVENOUS

## 2023-12-22 MED ORDER — SODIUM CHLORIDE 0.9 % IV SOLN: INTRAVENOUS | Status: DC

## 2023-12-22 MED ORDER — SENNA 8.6 MG PO TABS
1.0000 | ORAL_TABLET | Freq: Every day | ORAL | Status: DC | PRN
  Administered 2023-12-23: 8.6 mg via ORAL
  Filled 2023-12-22: qty 1

## 2023-12-22 MED ORDER — BUDESON-GLYCOPYRROL-FORMOTEROL 160-9-4.8 MCG/ACT IN AERO
2.0000 | INHALATION_SPRAY | Freq: Two times a day (BID) | RESPIRATORY_TRACT | Status: DC
  Administered 2023-12-23 – 2023-12-24 (×2): 2 via RESPIRATORY_TRACT
  Filled 2023-12-22: qty 5.9

## 2023-12-22 MED ORDER — CEFTRIAXONE SODIUM 2 G IJ SOLR
2.0000 g | INTRAMUSCULAR | Status: DC
  Administered 2023-12-23 – 2023-12-24 (×2): 2 g via INTRAVENOUS
  Filled 2023-12-22 (×2): qty 20

## 2023-12-22 MED ORDER — SODIUM CHLORIDE 0.9 % IV BOLUS
1000.0000 mL | Freq: Once | INTRAVENOUS | Status: AC
  Administered 2023-12-22: 1000 mL via INTRAVENOUS

## 2023-12-22 MED ORDER — ACETAMINOPHEN 650 MG RE SUPP: 650.0000 mg | Freq: Four times a day (QID) | RECTAL | Status: DC | PRN

## 2023-12-22 MED ORDER — SODIUM CHLORIDE 0.9 % IV SOLN
2.0000 g | Freq: Once | INTRAVENOUS | Status: AC
  Administered 2023-12-22: 2 g via INTRAVENOUS
  Filled 2023-12-22: qty 20

## 2023-12-22 MED ORDER — PROCHLORPERAZINE EDISYLATE 10 MG/2ML IJ SOLN: 5.0000 mg | Freq: Four times a day (QID) | INTRAMUSCULAR | Status: DC | PRN

## 2023-12-22 MED ORDER — TAMSULOSIN HCL 0.4 MG PO CAPS
0.4000 mg | ORAL_CAPSULE | Freq: Every day | ORAL | Status: DC
  Administered 2023-12-23 – 2023-12-24 (×2): 0.4 mg via ORAL
  Filled 2023-12-22 (×2): qty 1

## 2023-12-22 MED ORDER — ALBUTEROL SULFATE (2.5 MG/3ML) 0.083% IN NEBU: 2.5000 mg | INHALATION_SOLUTION | Freq: Four times a day (QID) | RESPIRATORY_TRACT | Status: DC | PRN

## 2023-12-22 MED ORDER — PANTOPRAZOLE SODIUM 40 MG PO TBEC
40.0000 mg | DELAYED_RELEASE_TABLET | Freq: Every day | ORAL | Status: DC
  Administered 2023-12-23 – 2023-12-24 (×2): 40 mg via ORAL
  Filled 2023-12-22 (×2): qty 1

## 2023-12-22 MED ORDER — OXYCODONE HCL 5 MG PO TABS
5.0000 mg | ORAL_TABLET | ORAL | Status: DC | PRN
  Administered 2023-12-23 – 2023-12-24 (×3): 5 mg via ORAL
  Filled 2023-12-22 (×3): qty 1

## 2023-12-22 NOTE — Progress Notes (Signed)
 Patient has refused CPAP for tonight.  He wants to see how he does without it.  I explained that we do have machine here that he may use if he would like.  Patient stated he would let us  know.

## 2023-12-22 NOTE — ED Triage Notes (Signed)
 Patient states has been on antibiotics for several days for UTI/prostatitis. Patent states chills with fever last night. PCP sent him for blood work.

## 2023-12-22 NOTE — ED Provider Notes (Signed)
 [facility] AT [facility] Provider Note   CSN: [id number] Arrival date & time: 12/22/23  1153     Patient presents with: Fever   William Howell is a 83 y.o. male.   Patient with history of diabetes, hypertension, GERD, history of cholecystectomy --presents to the emergency department today for evaluation of fevers.  Patient began having fever, increased urinary frequency and urgency and some mild dysuria.  He saw his doctor on 9/5.  Blood pressure was low at that time per PCP note (116/56).  Prostate was mildly swollen and tender per PCP note.  Started on Bactrim  for presumed prostatitis, discontinued losartan  and metformin .  On follow-up today, he was not doing much better.  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  No anticoagulation.  States that he felt like he had to have a bowel movement this morning but was not productive.  He has been having normal bowel movements prior to this.  No vomiting.  He denies upper respiratory tract symptoms, reports negative COVID.       Prior to Admission medications   Medication Sig Start Date End Date Taking? Authorizing Provider  benralizumab  (FASENRA  PEN) 30 MG/ML prefilled autoinjector Inject 30mg  into the skin at Week 0, Week 4 06/11/23   Geronimo Amel, MD  benralizumab  (FASENRA  PEN) 30 MG/ML prefilled autoinjector Inject 30mg  into the skin at Week 8 and every 8 weeks 06/11/23   Geronimo Amel, MD  Budeson-Glycopyrrol-Formoterol  (BREZTRI  AEROSPHERE) 160-9-4.8 MCG/ACT AERO Inhale 2 puffs into the lungs 2 (two) times daily. 02/11/23   Duanne Butler DASEN, MD  cetirizine  (ZYRTEC ) 10 MG tablet Take 1 tablet by mouth once daily 12/05/23   Duanne Butler DASEN, MD  losartan  (COZAAR ) 50 MG tablet Take 1 tablet by mouth once daily 11/17/23   Duanne Butler DASEN, MD  metFORMIN  (GLUCOPHAGE ) 500 MG tablet TAKE 1 TABLET BY MOUTH TWICE DAILY WITH A MEAL 10/23/23   Duanne Butler DASEN, MD  Multiple  Vitamins-Minerals (MULTIVITAMIN WITH MINERALS) tablet Take 1 tablet daily by mouth.    [provider]  Nintedanib (OFEV ) 100 MG CAPS Take 1 capsule (100 mg total) by mouth 2 (two) times daily. 09/23/23   Geronimo Amel, MD  omeprazole  (PRILOSEC) 40 MG capsule Take 1 capsule by mouth once daily 11/17/23   Duanne Butler DASEN, MD  sulfamethoxazole -trimethoprim  (BACTRIM  DS) 800-160 MG tablet Take 1 tablet by mouth 2 (two) times daily. 12/19/23   Duanne Butler DASEN, MD  tamsulosin  (FLOMAX ) 0.4 MG CAPS capsule Take 1 capsule by mouth once daily 12/09/23   Duanne Butler DASEN, MD  VENTOLIN  HFA 108 [phone] Base) MCG/ACT inhaler Inhale 2 puffs into the lungs every 6 (six) hours as needed for wheezing or shortness of breath. 04/02/23   Kayla Jeoffrey RAMAN, FNP  zafirlukast  (ACCOLATE ) 20 MG tablet Take 1 tablet (20 mg total) by mouth 2 (two) times daily. 10/30/23   Duanne Butler DASEN, MD    Allergies: Patient has no known allergies.    Review of Systems  Updated Vital Signs BP (!) 97/59 (BP Location: Right Arm)   Pulse 74   Temp 98.1 F (36.7 C) (Oral)   Resp (!) 24   SpO2 96%   Physical Exam Vitals and nursing note reviewed.  Constitutional:      General: He is not in acute distress.    Appearance: He is well-developed.  HENT:     Head: Normocephalic and atraumatic.     Nose:  Nose normal.     Mouth/Throat:     Mouth: Mucous membranes are moist.  Eyes:     General:        Right eye: No discharge.        Left eye: No discharge.     Conjunctiva/sclera: Conjunctivae normal.  Cardiovascular:     Rate and Rhythm: Normal rate and regular rhythm.     Heart sounds: Normal heart sounds.  Pulmonary:     Effort: Pulmonary effort is normal.     Breath sounds: Normal breath sounds.  Abdominal:     Palpations: Abdomen is soft.     Tenderness: There is no abdominal tenderness. There is no guarding or rebound.  Musculoskeletal:     Cervical back: Normal range of motion and neck supple.  Skin:     General: Skin is warm and dry.  Neurological:     General: No focal deficit present.     Mental Status: He is alert. Mental status is at baseline.     Comments: Normal mentation     (all labs ordered are listed, but only abnormal results are displayed) Labs Reviewed  COMPREHENSIVE METABOLIC PANEL WITH GFR - Abnormal; Notable for the following components:      Result Value   Glucose, Bld 103 (*)    BUN 38 (*)    Creatinine, Ser 2.95 (*)    GFR, Estimated 20 (*)    All other components within normal limits  CBC WITH DIFFERENTIAL/PLATELET - Abnormal; Notable for the following components:   WBC 14.0 (*)    Hemoglobin 12.6 (*)    HCT 37.8 (*)    Platelets 128 (*)    Neutro Abs 12.3 (*)    Abs Immature Granulocytes 0.08 (*)    All other components within normal limits  URINALYSIS, W/ REFLEX TO CULTURE (INFECTION SUSPECTED) - Abnormal; Notable for the following components:   APPearance HAZY (*)    Hgb urine dipstick LARGE (*)    Leukocytes,Ua LARGE (*)    All other components within normal limits  CULTURE, BLOOD (ROUTINE X 2)  CULTURE, BLOOD (ROUTINE X 2)  URINE CULTURE  LACTIC ACID, PLASMA  LACTIC ACID, PLASMA    EKG: None  Radiology: CT ABDOMEN PELVIS WO CONTRAST Result Date: 12/22/2023 CLINICAL DATA:  Abdominal pain, fever, UTI/prostatitis. EXAM: CT ABDOMEN AND PELVIS WITHOUT CONTRAST TECHNIQUE: Multidetector CT imaging of the abdomen and pelvis was performed following the standard protocol without IV contrast. RADIATION DOSE REDUCTION: This exam was performed according to the departmental dose-optimization program which includes automated exposure control, adjustment of the mA and/or kV according to patient size and/or use of iterative reconstruction technique. COMPARISON:  01/16/2019. FINDINGS: Lower chest: Basilar fibrotic interstitial lung disease, better evaluated on CT chest 05/22/2023. Heart is at the upper limits of normal in size. No pericardial or pleural effusion.  Distal esophagus is grossly unremarkable. Hepatobiliary: Liver is grossly unremarkable. Cholecystectomy. No biliary ductal dilatation. Pancreas: Negative. Spleen: Negative. Adrenals/Urinary Tract: Adrenal glands are unremarkable. Tiny bilateral renal stones. There may be a subcentimeter low-attenuation lesion in the upper pole right kidney, too small to characterize. No specific follow-up necessary. Kidneys are otherwise unremarkable. Mild right hydronephrosis secondary to a 4 mm stone in the distal right ureter (2/71). Left ureter is decompressed. Bladder is relatively low in volume. Stomach/Bowel: Stomach, small bowel, appendix and colon are unremarkable. Vascular/Lymphatic: Atherosclerotic calcification of the aorta. No pathologically enlarged lymph nodes. Reproductive: Prostate is normal in size. Other: Small bilateral inguinal hernias  contain fat. No free fluid. Mesenteries and peritoneum are unremarkable. Musculoskeletal: Degenerative changes in the spine. IMPRESSION: 1. Mild right hydronephrosis secondary to a 4 mm stone in the distal right ureter. 2. Bilateral renal stones. 3. Basilar fibrotic interstitial lung disease, better evaluated on CT chest 05/22/2023. 4.  Aortic atherosclerosis (ICD10-I70.0). Electronically Signed   By: Newell Eke M.D.   On: 12/22/2023 14:59     Procedures   Medications Ordered in the ED  sodium chloride  0.9 % bolus 1,000 mL (0 mLs Intravenous Stopped 12/22/23 1529)  cefTRIAXone  (ROCEPHIN ) 2 g in sodium chloride  0.9 % 100 mL IVPB (0 g Intravenous Stopped 12/22/23 1537)  sodium chloride  0.9 % bolus 1,000 mL (0 mLs Intravenous Stopped 12/22/23 1617)   ED Course  Patient seen and examined. History obtained directly from patient. Work-up including labs, imaging, EKG ordered in triage, if performed, were reviewed.    Labs/EKG: Independently reviewed and interpreted.  This included: CBC with white blood cell count elevated at 14,000, mildly low hemoglobin at 12.6, elevated  neutrophils, thrombocytopenia platelet count 128; CMP creatinine has bumped to 2.95 with a BUN of 38, normal electrolytes and kidney function; lactate was normal 1.7.  Awaiting UA.  Added blood cultures.  Imaging: Ordered CT abdomen and pelvis without contrast  Medications/Fluids: Ordered: IV fluid bolus  Most recent vital signs reviewed and are as follows: BP (!) 97/59 (BP Location: Right Arm)   Pulse 74   Temp 98.1 F (36.7 C) (Oral)   Resp (!) 24   SpO2 96%   Initial impression: Concern for prostatitis, will try to rule out other potential sources of intra-abdominal infection with CT imaging.  4:09 PM Reassessment performed. Patient appears stable, comfortable.  No fever, no vomiting.  Labs personally reviewed and interpreted including: CBC elevated white blood cell count 14.0; CMP creatinine elevated to 2.95 with a BUN of 38; UA grossly infected with white blood cell clumps, clean-catch.  Imaging personally visualized and interpreted including: CT, agree mild right hydro secondary to distal 4 mm stone  Reviewed pertinent lab work and imaging with patient at bedside. Questions answered.   Most current vital signs reviewed and are as follows: BP 108/68   Pulse (!) 56   Temp 98.1 F (36.7 C) (Oral)   Resp 20   SpO2 98%   Plan: Admission to hospitalist service  I spoke with on-call urologist Dr. Lovie.  Given that the patient looks well, is currently afebrile --plan is for admission, optimization, possible stent tomorrow.  Agrees with hospitalist admit.  Will reach back out to urology if patient gets worse (high fever, vomiting).   Hospitalist page ordered.   4:32 PM Discussed case with Dr. Madelyne with [facility]. Accepts for admission.   Patient discussed with and seen earlier by Dr. Emil, ED attending.                                   Medical Decision Making Amount and/or Complexity of Data Reviewed Labs: ordered. Radiology: ordered.   Patient with febrile UTI  with mildly obstructive right sided ureteral stone.  Afebrile in the ED.  He also has an AKI.  Case was discussed with urology who will consult tomorrow unless symptoms get worse.  Lactic acid normal.  Antibiotics initiated.  For this patient's complaint of abdominal pain, the following conditions were considered on the differential diagnosis: gastritis/PUD, enteritis/duodenitis, appendicitis, cholelithiasis/cholecystitis, cholangitis, pancreatitis, ruptured viscus, colitis,  diverticulitis, small/large bowel obstruction, proctitis, cystitis, pyelonephritis, ureteral colic, aortic dissection, aortic aneurysm. Atypical chest etiologies were also considered including ACS, PE, and pneumonia.     Final diagnoses:  Pyelonephritis  Ureteral stone  Acute kidney injury [facility])    ED Discharge Orders     None          Desiderio Chew, PA-C 12/22/23 1634    Emil Share, DO 12/24/23 [phone]

## 2023-12-22 NOTE — Telephone Encounter (Signed)
 FYI Only or Action Required?: FYI only for provider.  Patient was last seen in primary care on 12/19/2023 by Duanne Butler DASEN, MD.  Called Nurse Triage reporting Fever.  Symptoms began several days ago.  Interventions attempted: OTC medications: ibuprofen and Prescription medications: bactrim .  Symptoms are: gradually worsening.  Triage Disposition: See HCP Within 4 Hours (Or PCP Triage)  Patient/caregiver understands and will follow disposition?: Yes      Copied from CRM #[id number]. Topic: Clinical - Red Word Triage >> Dec 22, 2023  9:02 AM William Howell wrote: Red Word that prompted transfer to Nurse Triage: Patient recently had a office visit on Friday, wife stated he was prescribed sulfamethoxazole -trimethoprim  (BACTRIM  DS) 800-160 MG tablet [[id number]]. Patient woke up sick, shivering, no appetite, fever 100.9   ----------------------------------------------------------------------- From previous Reason for Contact - Pink Word Triage: Reason for Triage: Reason for Disposition  [1] Fever > 100 F (37.8 C) AND [2] new-onset since starting antibiotics  Answer Assessment - Initial Assessment Questions 1. MAIN SYMPTOM: What is the main symptom you are concerned about? (e.g., painful urination, urine frequency)     Shivering, fever 2. BETTER-SAME-WORSE: Are you getting better, staying the same, or getting worse compared to how you felt at your last visit to the doctor (most recent medical visit)?     worse 3. PAIN: How bad is the pain?  (e.g., Scale 1-10; mild, moderate, or severe)     *No Answer* 4. FEVER: Do you have a fever? If Yes, ask: What is it, how was it measured, and when did it start?     0845 ibuprofen given  0905 @ 101.7 5. OTHER SYMPTOMS: Do you have any other symptoms? (e.g., blood in the urine, flank pain, vaginal discharge)     Shivering uncontrollably 6. DIAGNOSIS: When was the UTI diagnosed? By whom? Was it a kidney infection, bladder infection  or both?     Last Friday - prostate infection? 7. ANTIBIOTIC: What antibiotic(s) are you taking? How many times per day?     bactrim  8. ANTIBIOTIC - START DATE: When did you start taking the antibiotic?     Friday  Protocols used: Urinary Tract Infection on Antibiotic Follow-up Call - [facility]

## 2023-12-22 NOTE — ED Notes (Signed)
 ED TO INPATIENT HANDOFF REPORT  ED Nurse Name and Phone #:  Ester Sloop EMT-P [phone]  S Name/Age/Gender William Howell 82 y.o. male Room/Bed: DB011/DB011  Code Status   Code Status: Not on file  Home/SNF/Other Home Patient oriented to: self, place, time, and situation Is this baseline? Yes   Triage Complete: Triage complete  Chief Complaint Kidney stone [N20.0]  Triage Note Patient states has been on antibiotics for several days for UTI/prostatitis. Patent states chills with fever last night. PCP sent him for blood work.   Allergies No Known Allergies  Level of Care/Admitting Diagnosis ED Disposition     ED Disposition  Admit   Condition  --   Comment  Hospital Area: [facility] [[id number]]  Level of Care: Telemetry [5]  Admit to tele based on following criteria: Monitor QTC interval  Interfacility transfer: Yes  May place patient in observation at [facility] or [facility] if equivalent level of care is available:: No  Covid Evaluation: Asymptomatic - no recent exposure (last 10 days) testing not required  Diagnosis: Kidney stone [825965]  Admitting Physician: MADELYNE OWEN LABOR [[id number]]  Attending Physician: FLOYD, DAN [[id number]]  For patients discharging to extended facilities (i.e. SNF, AL, group homes or LTAC) initiate:: Discharge to SNF/Facility Placement COVID-19 Lab Testing Protocol          B Medical/Surgery History Past Medical History:  Diagnosis Date   Allergy     Asthma    Diabetes mellitus type II, controlled (HCC)    GERD (gastroesophageal reflux disease)    pt states he takes omeprazole  but doesnt have GERD   Hypertension    Obesity    Sleep apnea    uses CPAP   Past Surgical History:  Procedure Laterality Date   CHOLECYSTECTOMY     DENTAL SURGERY       A IV Location/Drains/Wounds Patient Lines/Drains/Airways Status     Active Line/Drains/Airways     Name Placement date Placement time Site Days    Peripheral IV 12/22/23 20 G Right Antecubital 12/22/23  1353  Antecubital  less than 1            Intake/Output Last 24 hours  Intake/Output Summary (Last 24 hours) at 12/22/2023 1742 Last data filed at 12/22/2023 1617 Gross per 24 hour  Intake 2100 ml  Output --  Net 2100 ml    Labs/Imaging Results for orders placed or performed during the hospital encounter of 12/22/23 (from the past 48 hours)  Lactic acid, plasma     Status: None   Collection Time: 12/22/23 12:14 PM  Result Value Ref Range   Lactic Acid, Venous 1.7 0.5 - 1.9 mmol/L    Comment: Performed at [facility], [address] [place], [place], [place] [address]  Comprehensive metabolic panel     Status: Abnormal   Collection Time: 12/22/23 12:14 PM  Result Value Ref Range   Sodium 135 135 - 145 mmol/L   Potassium 4.3 3.5 - 5.1 mmol/L   Chloride 98 98 - 111 mmol/L   CO2 22 22 - 32 mmol/L   Glucose, Bld 103 (H) 70 - 99 mg/dL    Comment: Glucose reference range applies only to samples taken after fasting for at least 8 hours.   BUN 38 (H) 8 - 23 mg/dL   Creatinine, Ser 7.04 (H) 0.61 - 1.24 mg/dL   Calcium 9.4 8.9 - 89.6 mg/dL   Total Protein 6.8 6.5 - 8.1 g/dL   Albumin 3.7 3.5 -  5.0 g/dL   AST 32 15 - 41 U/L   ALT 31 0 - 44 U/L   Alkaline Phosphatase 125 38 - 126 U/L   Total Bilirubin 0.4 0.0 - 1.2 mg/dL   GFR, Estimated 20 (L) >60 mL/min    Comment: (NOTE) Calculated using the CKD-EPI Creatinine Equation (2021)    Anion gap 15 5 - 15    Comment: Performed at [facility], [address], [place], [place] [address]  CBC with Differential     Status: Abnormal   Collection Time: 12/22/23 12:14 PM  Result Value Ref Range   WBC 14.0 (H) 4.0 - 10.5 K/uL   RBC 4.22 4.22 - 5.81 MIL/uL   Hemoglobin 12.6 (L) 13.0 - 17.0 g/dL   HCT 62.1 (L) 60.9 - 47.9 %   MCV 89.6 80.0 - 100.0 fL   MCH 29.9 26.0 - 34.0 pg   MCHC 33.3 30.0 - 36.0 g/dL   RDW 85.7 88.4 - 84.4 %   Platelets 128 (L)  150 - 400 K/uL   nRBC 0.0 0.0 - 0.2 %   Neutrophils Relative % 87 %   Neutro Abs 12.3 (H) 1.7 - 7.7 K/uL   Lymphocytes Relative 6 %   Lymphs Abs 0.9 0.7 - 4.0 K/uL   Monocytes Relative 6 %   Monocytes Absolute 0.8 0.1 - 1.0 K/uL   Eosinophils Relative 0 %   Eosinophils Absolute 0.0 0.0 - 0.5 K/uL   Basophils Relative 0 %   Basophils Absolute 0.0 0.0 - 0.1 K/uL   Immature Granulocytes 1 %   Abs Immature Granulocytes 0.08 (H) 0.00 - 0.07 K/uL    Comment: Performed at [facility], [address], [place], [place] [address]  Urinalysis, w/ Reflex to Culture (Infection Suspected) -Urine, Clean Catch     Status: Abnormal   Collection Time: 12/22/23  3:23 PM  Result Value Ref Range   Specimen Source URINE, CLEAN CATCH    Color, Urine YELLOW YELLOW   APPearance HAZY (A) CLEAR   Specific Gravity, Urine 1.010 1.005 - 1.030   pH 5.5 5.0 - 8.0   Glucose, UA NEGATIVE NEGATIVE mg/dL   Hgb urine dipstick LARGE (A) NEGATIVE   Bilirubin Urine NEGATIVE NEGATIVE   Ketones, ur NEGATIVE NEGATIVE mg/dL   Protein, ur NEGATIVE NEGATIVE mg/dL   Nitrite NEGATIVE NEGATIVE   Leukocytes,Ua LARGE (A) NEGATIVE   RBC / HPF >50 0 - 5 RBC/hpf   WBC, UA >50 0 - 5 WBC/hpf    Comment:        Reflex urine culture not performed if WBC <=10, OR if Squamous epithelial cells >5. If Squamous epithelial cells >5 suggest recollection.    Bacteria, UA NONE SEEN NONE SEEN   Squamous Epithelial / HPF 0-5 0 - 5 /HPF   WBC Clumps PRESENT     Comment: Performed at [facility], [address] [place], [place] [address]  Lactic acid, plasma     Status: None   Collection Time: 12/22/23  4:15 PM  Result Value Ref Range   Lactic Acid, Venous 1.1 0.5 - 1.9 mmol/L    Comment: Performed at [facility], [address] [place], [place] [address]   CT ABDOMEN PELVIS WO CONTRAST Result Date: 12/22/2023 CLINICAL DATA:  Abdominal pain, fever, UTI/prostatitis. EXAM: CT  ABDOMEN AND PELVIS WITHOUT CONTRAST TECHNIQUE: Multidetector CT imaging of the abdomen and pelvis was performed following the standard protocol without IV contrast. RADIATION DOSE REDUCTION: This exam  was performed according to the departmental dose-optimization program which includes automated exposure control, adjustment of the mA and/or kV according to patient size and/or use of iterative reconstruction technique. COMPARISON:  01/16/2019. FINDINGS: Lower chest: Basilar fibrotic interstitial lung disease, better evaluated on CT chest 05/22/2023. Heart is at the upper limits of normal in size. No pericardial or pleural effusion. Distal esophagus is grossly unremarkable. Hepatobiliary: Liver is grossly unremarkable. Cholecystectomy. No biliary ductal dilatation. Pancreas: Negative. Spleen: Negative. Adrenals/Urinary Tract: Adrenal glands are unremarkable. Tiny bilateral renal stones. There may be a subcentimeter low-attenuation lesion in the upper pole right kidney, too small to characterize. No specific follow-up necessary. Kidneys are otherwise unremarkable. Mild right hydronephrosis secondary to a 4 mm stone in the distal right ureter (2/71). Left ureter is decompressed. Bladder is relatively low in volume. Stomach/Bowel: Stomach, small bowel, appendix and colon are unremarkable. Vascular/Lymphatic: Atherosclerotic calcification of the aorta. No pathologically enlarged lymph nodes. Reproductive: Prostate is normal in size. Other: Small bilateral inguinal hernias contain fat. No free fluid. Mesenteries and peritoneum are unremarkable. Musculoskeletal: Degenerative changes in the spine. IMPRESSION: 1. Mild right hydronephrosis secondary to a 4 mm stone in the distal right ureter. 2. Bilateral renal stones. 3. Basilar fibrotic interstitial lung disease, better evaluated on CT chest 05/22/2023. 4.  Aortic atherosclerosis (ICD10-I70.0). Electronically Signed   By: Newell Eke M.D.   On: 12/22/2023 14:59     Pending Labs Unresulted Labs (From admission, onward)     Start     Ordered   12/22/23 1345  Urine Culture  Once,   URGENT       Question:  Indication  Answer:  Dysuria   12/22/23 1344   12/22/23 1321  Blood culture (routine x 2)  BLOOD CULTURE X 2,   STAT      12/22/23 1320            Vitals/Pain Today's Vitals   12/22/23 1212 12/22/23 1435 12/22/23 1619  BP: (!) 97/59 108/68   Pulse: 74 (!) 56   Resp: (!) 24 20   Temp: 98.1 F (36.7 C)  97.7 F (36.5 C)  TempSrc: Oral  Oral  SpO2: 96% 98%     Isolation Precautions No active isolations  Medications Medications  0.9 %  sodium chloride  infusion (has no administration in time range)  sodium chloride  0.9 % bolus 1,000 mL (0 mLs Intravenous Stopped 12/22/23 1529)  cefTRIAXone  (ROCEPHIN ) 2 g in sodium chloride  0.9 % 100 mL IVPB (0 g Intravenous Stopped 12/22/23 1537)  sodium chloride  0.9 % bolus 1,000 mL (0 mLs Intravenous Stopped 12/22/23 1617)    Mobility walks     Focused Assessments    R Recommendations: See Admitting Provider Note  Report given to:   Additional Notes:

## 2023-12-22 NOTE — Progress Notes (Signed)
 Patient Office Visit  Assessment & Plan:  Prostatitis, unspecified prostatitis type   Assessment and Plan    Acute prostatitis with fever and nocturia ? sepsis Acute prostatitis with fever, nocturia, tremors, chills, and dark urine. Concerns for potential sepsis due to fever and antibiotic treatment. Differential includes urinary tract infection and sepsis. Risk of rapid deterioration if sepsis develops. - Refer to ER for further evaluation and management due to risk of sepsis. - Advise ER to perform urine culture and CBC to assess for infection and sepsis. - Discussed with him and family the importance of ER visit due to potential for rapid deterioration.  Decreased appetite Decreased appetite since onset of illness, associated with poor oral intake, potentially contributing to dehydration. - Discuss potential need for IV fluids at ER if oral intake remains inadequate.  Constipation Recent onset of constipation with indigestion and gas.  Hypotension Intermittent hypotension potentially related to dehydration and infection. Risk of exacerbation if sepsis develops. - Discuss potential need for IV fluids at ER to address hypotension.     Advised patient and wife to go to ED- they are going to [facility] location in [place] Return if symptoms worsen or fail to improve.   Subjective:    Patient ID: William Howell, male    DOB: [birth year]  Age: 83 y.o. MRN: [id number]  Chief Complaint  Patient presents with   Fever    Pt c/o of fever associated with tremors. Pt wife states that his highest temp was 104.     Fever    Discussed the use of AI scribe software for clinical note transcription with the patient, who gave verbal consent to proceed.      William Howell is an 83 year old male who presents with fever and suspected prostate infection and possible sepsis  He has been experiencing fever since Friday, temp up to 104 per wife, initially evaluated by Dr.  Duanne, last Friday, who suspected a prostate infection and prescribed Septra . He has been taking the antibiotic since Friday but did not take it this morning due to feeling unwell. Severe tremors occurred on Friday and again this morning, subsiding after being covered with blankets and taking ibuprofen and Tylenol . He tested negative for COVID recently.  His urine has been darker than normal, with pus and a little blood noted on Friday. He reports some soreness when urinating this morning, but not much pain overall. Increased frequency of urination at night, having to get up twice, which is unusual for him.  He experienced indigestion and gas last night, with difficulty having a bowel movement. His last bowel movement was yesterday evening and was normal. Decreased appetite since becoming ill, having only consumed water, Gatorade, and a little applesauce today. Last night, he ate [employer] but did not eat the supper prepared for him.  He lives in [place]. Physical Exam VITALS: BP- 116/56 Results Assessment & Plan Acute prostatitis with fever and nocturia ? sepsis Acute prostatitis with fever, nocturia, tremors, chills, and dark urine. Concerns for potential sepsis due to fever and antibiotic treatment. Differential includes urinary tract infection and sepsis. Risk of rapid deterioration if sepsis develops. - Refer to ER for further evaluation and management due to risk of sepsis. - Advise ER to perform urine culture and CBC to assess for infection and sepsis. - Discussed with him and family the importance of ER visit due to potential for rapid deterioration.  Decreased appetite Decreased appetite since onset of illness, associated  with poor oral intake, potentially contributing to dehydration. - Discuss potential need for IV fluids at ER if oral intake remains inadequate.  Constipation Recent onset of constipation with indigestion and gas.  Hypotension Intermittent hypotension  potentially related to dehydration and infection. Risk of exacerbation if sepsis develops. - Discuss potential need for IV fluids at ER to address hypotension. Patient may need hospital admission   The ASCVD Risk score (Arnett DK, et al., 2019) failed to calculate for the following reasons:   The 2019 ASCVD risk score is only valid for ages 30 to 83  Past Medical History:  Diagnosis Date   Allergy     Asthma    Diabetes mellitus type II, controlled (HCC)    GERD (gastroesophageal reflux disease)    pt states he takes omeprazole  but doesnt have GERD   Hypertension    Obesity    Sleep apnea    uses CPAP   Past Surgical History:  Procedure Laterality Date   CHOLECYSTECTOMY     DENTAL SURGERY     Social History   Tobacco Use   Smoking status: Never   Smokeless tobacco: Former    Types: Engineer, drilling   Vaping status: Never Used  Substance Use Topics   Alcohol use: Yes    Comment: occasional    Drug use: No   Family History  Problem Relation Age of Onset   Heart disease Father    Heart attack Father    Heart attack Sister    Colon cancer Neg Hx    Colon polyps Neg Hx    Esophageal cancer Neg Hx    Rectal cancer Neg Hx    Stomach cancer Neg Hx    Pancreatic cancer Neg Hx    Liver cancer Neg Hx    Prostate cancer Neg Hx    No Known Allergies  Review of Systems  Constitutional:  Positive for fever.      Objective:    BP 100/62   Pulse 77   Temp 98.2 F (36.8 C)   Ht 5' 6 (1.676 m)   Wt 243 lb 4 oz (110.3 kg)   SpO2 96%   BMI 39.26 kg/m  BP Readings from Last 3 Encounters:  12/22/23 108/68  12/22/23 100/62  12/19/23 (!) 116/56   Wt Readings from Last 3 Encounters:  12/22/23 243 lb 4 oz (110.3 kg)  12/19/23 236 lb (107 kg)  11/19/23 234 lb (106.1 kg)    Physical Exam Vitals and nursing note reviewed.  Constitutional:      Appearance: Normal appearance. He is ill-appearing.     Comments: Comes in with his wife, appears fatigued.   HENT:      Head: Normocephalic.     Right Ear: Tympanic membrane, ear canal and external ear normal.     Left Ear: Tympanic membrane, ear canal and external ear normal.  Eyes:     Extraocular Movements: Extraocular movements intact.     Pupils: Pupils are equal, round, and reactive to light.  Cardiovascular:     Rate and Rhythm: Normal rate and regular rhythm.     Heart sounds: Normal heart sounds.  Pulmonary:     Effort: Pulmonary effort is normal.     Breath sounds: Normal breath sounds. No wheezing.  Abdominal:     Tenderness: There is no abdominal tenderness. There is no guarding or rebound.  Musculoskeletal:     Right lower leg: No edema.     Left lower leg: No  edema.  Neurological:     General: No focal deficit present.     Mental Status: He is alert and oriented to person, place, and time.      Results for orders placed or performed during the hospital encounter of 12/22/23  Lactic acid, plasma  Result Value Ref Range   Lactic Acid, Venous 1.7 0.5 - 1.9 mmol/L  Comprehensive metabolic panel  Result Value Ref Range   Sodium 135 135 - 145 mmol/L   Potassium 4.3 3.5 - 5.1 mmol/L   Chloride 98 98 - 111 mmol/L   CO2 22 22 - 32 mmol/L   Glucose, Bld 103 (H) 70 - 99 mg/dL   BUN 38 (H) 8 - 23 mg/dL   Creatinine, Ser 7.04 (H) 0.61 - 1.24 mg/dL   Calcium 9.4 8.9 - 89.6 mg/dL   Total Protein 6.8 6.5 - 8.1 g/dL   Albumin 3.7 3.5 - 5.0 g/dL   AST 32 15 - 41 U/L   ALT 31 0 - 44 U/L   Alkaline Phosphatase 125 38 - 126 U/L   Total Bilirubin 0.4 0.0 - 1.2 mg/dL   GFR, Estimated 20 (L) >60 mL/min   Anion gap 15 5 - 15  CBC with Differential  Result Value Ref Range   WBC 14.0 (H) 4.0 - 10.5 K/uL   RBC 4.22 4.22 - 5.81 MIL/uL   Hemoglobin 12.6 (L) 13.0 - 17.0 g/dL   HCT 62.1 (L) 60.9 - 47.9 %   MCV 89.6 80.0 - 100.0 fL   MCH 29.9 26.0 - 34.0 pg   MCHC 33.3 30.0 - 36.0 g/dL   RDW 85.7 88.4 - 84.4 %   Platelets 128 (L) 150 - 400 K/uL   nRBC 0.0 0.0 - 0.2 %   Neutrophils Relative % 87  %   Neutro Abs 12.3 (H) 1.7 - 7.7 K/uL   Lymphocytes Relative 6 %   Lymphs Abs 0.9 0.7 - 4.0 K/uL   Monocytes Relative 6 %   Monocytes Absolute 0.8 0.1 - 1.0 K/uL   Eosinophils Relative 0 %   Eosinophils Absolute 0.0 0.0 - 0.5 K/uL   Basophils Relative 0 %   Basophils Absolute 0.0 0.0 - 0.1 K/uL   Immature Granulocytes 1 %   Abs Immature Granulocytes 0.08 (H) 0.00 - 0.07 K/uL  Urinalysis, w/ Reflex to Culture (Infection Suspected) -Urine, Clean Catch  Result Value Ref Range   Specimen Source URINE, CLEAN CATCH    Color, Urine YELLOW YELLOW   APPearance HAZY (A) CLEAR   Specific Gravity, Urine 1.010 1.005 - 1.030   pH 5.5 5.0 - 8.0   Glucose, UA NEGATIVE NEGATIVE mg/dL   Hgb urine dipstick LARGE (A) NEGATIVE   Bilirubin Urine NEGATIVE NEGATIVE   Ketones, ur NEGATIVE NEGATIVE mg/dL   Protein, ur NEGATIVE NEGATIVE mg/dL   Nitrite NEGATIVE NEGATIVE   Leukocytes,Ua LARGE (A) NEGATIVE   RBC / HPF >50 0 - 5 RBC/hpf   WBC, UA >50 0 - 5 WBC/hpf   Bacteria, UA NONE SEEN NONE SEEN   Squamous Epithelial / HPF 0-5 0 - 5 /HPF   WBC Clumps PRESENT

## 2023-12-22 NOTE — H&P (Signed)
 History and Physical    TAYVON CULLEY FMW:[id number] DOB: 07/11/1940 DOA: 12/22/2023  PCP: Duanne Butler DASEN, MD   Patient coming from: Home   Chief Complaint: Fever, dysuria, hematuria   HPI: GENEVA PALLAS is an 83 y.o. male with medical history significant for asthma, ILD, OSA on CPAP, HTN, T2DM, and CKD 3A who presents with fevers, dysuria, and hematuria.   He developed fevers, general malaise, and pain in the right flank and groin, was seen by his PCP on 12/19/23, noted to have tender prostate and was started on Septra  for suspected prostatitis. He has continue to feel poorly with shaking chills, was seen again for this today in an outpatient clinic, and was sent to the ED due to concern for sepsis.    [facility] ED Course: Upon arrival to the ED, patient is found to be afebrile and saturating well on room air with normal HR and SBP upper 90s and higher. Labs are most notable for SCr 2.95, WBC 14,000, platelets 128,000, and normal lactate. CT demonstrates mild right hydronephrosis due to a distal right ureteral stone.   Urology (Dr. Lovie) was consulted by the ED PA, blood and urine cultures were collected, 2 liter NS and 2 g IV Rocephin  were administered, and the patient was transferred to [facility] for ongoing management.   Review of Systems:  All other systems reviewed and apart from HPI, are negative.  Past Medical History:  Diagnosis Date   Allergy     Asthma    Diabetes mellitus type II, controlled (HCC)    GERD (gastroesophageal reflux disease)    pt states he takes omeprazole  but doesnt have GERD   Hypertension    Obesity    Sleep apnea    uses CPAP    Past Surgical History:  Procedure Laterality Date   CHOLECYSTECTOMY     DENTAL SURGERY      Social History:   reports that he has never smoked. He has quit using smokeless tobacco.  His smokeless tobacco use included chew. He reports current alcohol use. He reports that he does not use  drugs.  No Known Allergies  Family History  Problem Relation Age of Onset   Heart disease Father    Heart attack Father    Heart attack Sister    Colon cancer Neg Hx    Colon polyps Neg Hx    Esophageal cancer Neg Hx    Rectal cancer Neg Hx    Stomach cancer Neg Hx    Pancreatic cancer Neg Hx    Liver cancer Neg Hx    Prostate cancer Neg Hx      Prior to Admission medications   Medication Sig Start Date End Date Taking? Authorizing Provider  benralizumab  (FASENRA  PEN) 30 MG/ML prefilled autoinjector Inject 30mg  into the skin at Week 0, Week 4 06/11/23   Geronimo Amel, MD  benralizumab  (FASENRA  PEN) 30 MG/ML prefilled autoinjector Inject 30mg  into the skin at Week 8 and every 8 weeks 06/11/23   Geronimo Amel, MD  Budeson-Glycopyrrol-Formoterol  (BREZTRI  AEROSPHERE) 160-9-4.8 MCG/ACT AERO Inhale 2 puffs into the lungs 2 (two) times daily. 02/11/23   Duanne Butler DASEN, MD  cetirizine  (ZYRTEC ) 10 MG tablet Take 1 tablet by mouth once daily 12/05/23   Duanne Butler DASEN, MD  losartan  (COZAAR ) 50 MG tablet Take 1 tablet by mouth once daily 11/17/23   Duanne Butler DASEN, MD  metFORMIN  (GLUCOPHAGE ) 500 MG tablet TAKE 1 TABLET BY MOUTH TWICE DAILY WITH A  MEAL 10/23/23   Duanne Butler DASEN, MD  Multiple Vitamins-Minerals (MULTIVITAMIN WITH MINERALS) tablet Take 1 tablet daily by mouth.    [provider]  Nintedanib (OFEV ) 100 MG CAPS Take 1 capsule (100 mg total) by mouth 2 (two) times daily. 09/23/23   Geronimo Amel, MD  omeprazole  (PRILOSEC) 40 MG capsule Take 1 capsule by mouth once daily 11/17/23   Duanne Butler DASEN, MD  sulfamethoxazole -trimethoprim  (BACTRIM  DS) 800-160 MG tablet Take 1 tablet by mouth 2 (two) times daily. 12/19/23   Duanne Butler DASEN, MD  tamsulosin  (FLOMAX ) 0.4 MG CAPS capsule Take 1 capsule by mouth once daily 12/09/23   Duanne Butler DASEN, MD  VENTOLIN  HFA 108 (90 Base) MCG/ACT inhaler Inhale 2 puffs into the lungs every 6 (six) hours as needed for wheezing or  shortness of breath. 04/02/23   Kayla Jeoffrey RAMAN, FNP  zafirlukast  (ACCOLATE ) 20 MG tablet Take 1 tablet (20 mg total) by mouth 2 (two) times daily. 10/30/23   Duanne Butler DASEN, MD    Physical Exam: Vitals:   12/22/23 1808 12/22/23 1900 12/22/23 2107 12/22/23 2118  BP: (!) 142/73 122/66 (!) 128/57   Pulse: 61 (!) 55 (!) 57   Resp: 18 17 19    Temp: 97.7 F (36.5 C)  97.8 F (36.6 C)   TempSrc: Oral  Oral   SpO2: 98% 95% 97%   Weight:    110.3 kg  Height:    5' 6 (1.676 m)    Constitutional: NAD, calm  Eyes: PERTLA, lids and conjunctivae normal ENMT: Mucous membranes are moist. Posterior pharynx clear of any exudate or lesions.   Neck: supple, no masses  Respiratory: no wheezing, no crackles. No accessory muscle use.  Cardiovascular: S1 & S2 heard, regular rate and rhythm. No extremity edema.   Abdomen: No tenderness, soft. Bowel sounds active.  Musculoskeletal: no clubbing / cyanosis. No joint deformity upper and lower extremities.   Skin: no significant rashes, lesions, ulcers. Warm, dry, well-perfused. Neurologic: CN 2-12 grossly intact. Moving all extremities. Alert and oriented.  Psychiatric: Pleasant. Cooperative.    Labs and Imaging on Admission: I have personally reviewed following labs and imaging studies  CBC: Recent Labs  Lab 12/22/23 1214  WBC 14.0*  NEUTROABS 12.3*  HGB 12.6*  HCT 37.8*  MCV 89.6  PLT 128*   Basic Metabolic Panel: Recent Labs  Lab 12/22/23 1214  NA 135  K 4.3  CL 98  CO2 22  GLUCOSE 103*  BUN 38*  CREATININE 2.95*  CALCIUM 9.4   GFR: Estimated Creatinine Clearance: 22.1 mL/min (A) (by C-G formula based on SCr of 2.95 mg/dL (H)). Liver Function Tests: Recent Labs  Lab 12/22/23 1214  AST 32  ALT 31  ALKPHOS 125  BILITOT 0.4  PROT 6.8  ALBUMIN 3.7   No results for input(s): LIPASE, AMYLASE in the last 168 hours. No results for input(s): AMMONIA in the last 168 hours. Coagulation Profile: No results for input(s):  INR, PROTIME in the last 168 hours. Cardiac Enzymes: No results for input(s): CKTOTAL, CKMB, CKMBINDEX, TROPONINI in the last 168 hours. BNP (last 3 results) No results for input(s): PROBNP in the last 8760 hours. HbA1C: No results for input(s): HGBA1C in the last 72 hours. CBG: No results for input(s): GLUCAP in the last 168 hours. Lipid Profile: No results for input(s): CHOL, HDL, LDLCALC, TRIG, CHOLHDL, LDLDIRECT in the last 72 hours. Thyroid Function Tests: No results for input(s): TSH, T4TOTAL, FREET4, T3FREE, THYROIDAB in the last 72  hours. Anemia Panel: No results for input(s): VITAMINB12, FOLATE, FERRITIN, TIBC, IRON, RETICCTPCT in the last 72 hours. Urine analysis:    Component Value Date/Time   COLORURINE YELLOW 12/22/2023 1523   APPEARANCEUR HAZY (A) 12/22/2023 1523   LABSPEC 1.010 12/22/2023 1523   PHURINE 5.5 12/22/2023 1523   GLUCOSEU NEGATIVE 12/22/2023 1523   HGBUR LARGE (A) 12/22/2023 1523   BILIRUBINUR NEGATIVE 12/22/2023 1523   KETONESUR NEGATIVE 12/22/2023 1523   PROTEINUR NEGATIVE 12/22/2023 1523   NITRITE NEGATIVE 12/22/2023 1523   LEUKOCYTESUR LARGE (A) 12/22/2023 1523   Sepsis Labs: @LABRCNTIP (procalcitonin:4,lacticidven:4) )No results found for this or any previous visit (from the past 240 hours).   Radiological Exams on Admission: CT ABDOMEN PELVIS WO CONTRAST Result Date: 12/22/2023 CLINICAL DATA:  Abdominal pain, fever, UTI/prostatitis. EXAM: CT ABDOMEN AND PELVIS WITHOUT CONTRAST TECHNIQUE: Multidetector CT imaging of the abdomen and pelvis was performed following the standard protocol without IV contrast. RADIATION DOSE REDUCTION: This exam was performed according to the departmental dose-optimization program which includes automated exposure control, adjustment of the mA and/or kV according to patient size and/or use of iterative reconstruction technique. COMPARISON:  01/16/2019. FINDINGS: Lower  chest: Basilar fibrotic interstitial lung disease, better evaluated on CT chest 05/22/2023. Heart is at the upper limits of normal in size. No pericardial or pleural effusion. Distal esophagus is grossly unremarkable. Hepatobiliary: Liver is grossly unremarkable. Cholecystectomy. No biliary ductal dilatation. Pancreas: Negative. Spleen: Negative. Adrenals/Urinary Tract: Adrenal glands are unremarkable. Tiny bilateral renal stones. There may be a subcentimeter low-attenuation lesion in the upper pole right kidney, too small to characterize. No specific follow-up necessary. Kidneys are otherwise unremarkable. Mild right hydronephrosis secondary to a 4 mm stone in the distal right ureter (2/71). Left ureter is decompressed. Bladder is relatively low in volume. Stomach/Bowel: Stomach, small bowel, appendix and colon are unremarkable. Vascular/Lymphatic: Atherosclerotic calcification of the aorta. No pathologically enlarged lymph nodes. Reproductive: Prostate is normal in size. Other: Small bilateral inguinal hernias contain fat. No free fluid. Mesenteries and peritoneum are unremarkable. Musculoskeletal: Degenerative changes in the spine. IMPRESSION: 1. Mild right hydronephrosis secondary to a 4 mm stone in the distal right ureter. 2. Bilateral renal stones. 3. Basilar fibrotic interstitial lung disease, better evaluated on CT chest 05/22/2023. 4.  Aortic atherosclerosis (ICD10-I70.0). Electronically Signed   By: Newell Eke M.D.   On: 12/22/2023 14:59    Assessment/Plan   1. Obstructing right ureteral stone; UTI  - Urology consulted by ED and planning for possible ureteral stent 12/23/23  - Continue Rocephin , NPO after midnight, follow cultures and clinical course    2. AKI superimposed on CKD 3A  - SCr is 2.95, up from 1.49 in July 2025  - Hold losartan  and metformin , continue IVF hydration, renally-dose medications, repeat chem panel in am   3. ILD; asthma; OSA  - Stable    - Continue CPAP while  sleeping, ICS-LAMA-LABA, and as-needed SABA  4. Type II DM  - A1c was 6.0% in July 2025  - Check CBGs and use low-intensity SSI for now if needed  5. Hypertension  - Treat as-needed only for now     DVT prophylaxis: SCDs  Code Status: Full  Level of Care: Level of care: Telemetry Family Communication: None present   Disposition Plan:  Patient is from: home  Anticipated d/c is to: Home  Anticipated d/c date is: 12/24/23  Patient currently: Pending urology consultation and management of right ureteral stone, UTI  Consults called: Urology  Admission status: Observation  Evalene GORMAN Sprinkles, MD [facility]  12/22/2023, 9:36 PM

## 2023-12-23 ENCOUNTER — Encounter (HOSPITAL_COMMUNITY): Payer: Self-pay | Admitting: Internal Medicine

## 2023-12-23 ENCOUNTER — Other Ambulatory Visit: Payer: Self-pay

## 2023-12-23 ENCOUNTER — Inpatient Hospital Stay (HOSPITAL_COMMUNITY): Admitting: Anesthesiology

## 2023-12-23 ENCOUNTER — Inpatient Hospital Stay (HOSPITAL_COMMUNITY)

## 2023-12-23 ENCOUNTER — Encounter (HOSPITAL_COMMUNITY)
Admission: EM | Disposition: A | Discharge status: Home / Self Care | Payer: Self-pay | Source: Ambulatory Visit | Attending: Internal Medicine

## 2023-12-23 ENCOUNTER — Ambulatory Visit: Admitting: Internal Medicine

## 2023-12-23 DIAGNOSIS — E66812 Obesity, class 2: Secondary | ICD-10-CM | POA: Diagnosis present

## 2023-12-23 DIAGNOSIS — I959 Hypotension, unspecified: Secondary | ICD-10-CM | POA: Diagnosis present

## 2023-12-23 DIAGNOSIS — D6959 Other secondary thrombocytopenia: Secondary | ICD-10-CM | POA: Diagnosis present

## 2023-12-23 DIAGNOSIS — N3289 Other specified disorders of bladder: Secondary | ICD-10-CM | POA: Diagnosis present

## 2023-12-23 DIAGNOSIS — E861 Hypovolemia: Secondary | ICD-10-CM | POA: Diagnosis present

## 2023-12-23 DIAGNOSIS — Z8249 Family history of ischemic heart disease and other diseases of the circulatory system: Secondary | ICD-10-CM | POA: Diagnosis not present

## 2023-12-23 DIAGNOSIS — R5381 Other malaise: Secondary | ICD-10-CM | POA: Diagnosis present

## 2023-12-23 DIAGNOSIS — D649 Anemia, unspecified: Secondary | ICD-10-CM | POA: Diagnosis present

## 2023-12-23 DIAGNOSIS — Z6839 Body mass index (BMI) 39.0-39.9, adult: Secondary | ICD-10-CM | POA: Diagnosis not present

## 2023-12-23 DIAGNOSIS — E1122 Type 2 diabetes mellitus with diabetic chronic kidney disease: Secondary | ICD-10-CM | POA: Diagnosis present

## 2023-12-23 DIAGNOSIS — Z87891 Personal history of nicotine dependence: Secondary | ICD-10-CM | POA: Diagnosis not present

## 2023-12-23 DIAGNOSIS — G4733 Obstructive sleep apnea (adult) (pediatric): Secondary | ICD-10-CM | POA: Diagnosis present

## 2023-12-23 DIAGNOSIS — I129 Hypertensive chronic kidney disease with stage 1 through stage 4 chronic kidney disease, or unspecified chronic kidney disease: Secondary | ICD-10-CM | POA: Diagnosis present

## 2023-12-23 DIAGNOSIS — K219 Gastro-esophageal reflux disease without esophagitis: Secondary | ICD-10-CM | POA: Diagnosis present

## 2023-12-23 DIAGNOSIS — J45909 Unspecified asthma, uncomplicated: Secondary | ICD-10-CM | POA: Diagnosis present

## 2023-12-23 DIAGNOSIS — Z7951 Long term (current) use of inhaled steroids: Secondary | ICD-10-CM | POA: Diagnosis not present

## 2023-12-23 DIAGNOSIS — N201 Calculus of ureter: Secondary | ICD-10-CM

## 2023-12-23 DIAGNOSIS — N1831 Chronic kidney disease, stage 3a: Secondary | ICD-10-CM | POA: Diagnosis present

## 2023-12-23 DIAGNOSIS — N136 Pyonephrosis: Secondary | ICD-10-CM | POA: Diagnosis present

## 2023-12-23 DIAGNOSIS — J849 Interstitial pulmonary disease, unspecified: Secondary | ICD-10-CM | POA: Diagnosis present

## 2023-12-23 DIAGNOSIS — N179 Acute kidney failure, unspecified: Secondary | ICD-10-CM | POA: Diagnosis present

## 2023-12-23 DIAGNOSIS — N132 Hydronephrosis with renal and ureteral calculous obstruction: Secondary | ICD-10-CM | POA: Diagnosis not present

## 2023-12-23 DIAGNOSIS — Z9049 Acquired absence of other specified parts of digestive tract: Secondary | ICD-10-CM | POA: Diagnosis not present

## 2023-12-23 DIAGNOSIS — Z79899 Other long term (current) drug therapy: Secondary | ICD-10-CM | POA: Diagnosis not present

## 2023-12-23 DIAGNOSIS — Z7984 Long term (current) use of oral hypoglycemic drugs: Secondary | ICD-10-CM | POA: Diagnosis not present

## 2023-12-23 DIAGNOSIS — N200 Calculus of kidney: Secondary | ICD-10-CM

## 2023-12-23 HISTORY — PX: CYSTOSCOPY/URETEROSCOPY/HOLMIUM LASER/STENT PLACEMENT: SHX6546

## 2023-12-23 LAB — CBC
HCT: 34.7 % — ABNORMAL LOW (ref 39.0–52.0)
Hemoglobin: 10.7 g/dL — ABNORMAL LOW (ref 13.0–17.0)
MCH: 28.7 pg (ref 26.0–34.0)
MCHC: 30.8 g/dL (ref 30.0–36.0)
MCV: 93 fL (ref 80.0–100.0)
Platelets: 117 K/uL — ABNORMAL LOW (ref 150–400)
RBC: 3.73 MIL/uL — ABNORMAL LOW (ref 4.22–5.81)
RDW: 14.4 % (ref 11.5–15.5)
WBC: 12.5 K/uL — ABNORMAL HIGH (ref 4.0–10.5)
nRBC: 0 % (ref 0.0–0.2)

## 2023-12-23 LAB — BASIC METABOLIC PANEL WITH GFR
Anion gap: 10 (ref 5–15)
BUN: 35 mg/dL — ABNORMAL HIGH (ref 8–23)
CO2: 22 mmol/L (ref 22–32)
Calcium: 8.1 mg/dL — ABNORMAL LOW (ref 8.9–10.3)
Chloride: 107 mmol/L (ref 98–111)
Creatinine, Ser: 2.46 mg/dL — ABNORMAL HIGH (ref 0.61–1.24)
GFR, Estimated: 25 mL/min — ABNORMAL LOW (ref >60)
Glucose, Bld: 91 mg/dL (ref 70–99)
Potassium: 4.4 mmol/L (ref 3.5–5.1)
Sodium: 139 mmol/L (ref 135–145)

## 2023-12-23 LAB — GLUCOSE, CAPILLARY
Glucose-Capillary: 111 mg/dL — ABNORMAL HIGH (ref 70–99)
Glucose-Capillary: 115 mg/dL — ABNORMAL HIGH (ref 70–99)
Glucose-Capillary: 146 mg/dL — ABNORMAL HIGH (ref 70–99)
Glucose-Capillary: 83 mg/dL (ref 70–99)
Glucose-Capillary: 86 mg/dL (ref 70–99)
Glucose-Capillary: 90 mg/dL (ref 70–99)

## 2023-12-23 LAB — URINE CULTURE: Culture: NO GROWTH

## 2023-12-23 LAB — URIC ACID: Uric Acid, Serum: 6.3 mg/dL (ref 3.7–8.6)

## 2023-12-23 SURGERY — CYSTOSCOPY/URETEROSCOPY/HOLMIUM LASER/STENT PLACEMENT
Anesthesia: General | Site: Ureter | Laterality: Right

## 2023-12-23 MED ORDER — SODIUM CHLORIDE 0.9 % IR SOLN
Status: DC | PRN
  Administered 2023-12-23: 3000 mL

## 2023-12-23 MED ORDER — ONDANSETRON HCL 4 MG/2ML IJ SOLN: 4.0000 mg | Freq: Once | INTRAMUSCULAR | Status: DC | PRN

## 2023-12-23 MED ORDER — ACETAMINOPHEN 10 MG/ML IV SOLN: 1000.0000 mg | Freq: Once | INTRAVENOUS | Status: DC | PRN

## 2023-12-23 MED ORDER — LACTATED RINGERS IV SOLN: INTRAVENOUS | Status: AC

## 2023-12-23 MED ORDER — PROPOFOL 10 MG/ML IV BOLUS
INTRAVENOUS | Status: AC
Start: 2023-12-23 — End: 2023-12-23
  Filled 2023-12-23: qty 20

## 2023-12-23 MED ORDER — FENTANYL CITRATE (PF) 100 MCG/2ML IJ SOLN
INTRAMUSCULAR | Status: DC | PRN
  Administered 2023-12-23 (×2): 50 ug via INTRAVENOUS
  Administered 2023-12-23 (×2): 25 ug via INTRAVENOUS

## 2023-12-23 MED ORDER — ONDANSETRON HCL 4 MG/2ML IJ SOLN
INTRAMUSCULAR | Status: DC | PRN
  Administered 2023-12-23: 4 mg via INTRAVENOUS

## 2023-12-23 MED ORDER — FENTANYL CITRATE (PF) 100 MCG/2ML IJ SOLN
INTRAMUSCULAR | Status: AC
  Filled 2023-12-23: qty 2

## 2023-12-23 MED ORDER — OXYCODONE HCL 5 MG PO TABS: 5.0000 mg | ORAL_TABLET | Freq: Once | ORAL | Status: DC | PRN

## 2023-12-23 MED ORDER — LIDOCAINE HCL (CARDIAC) PF 100 MG/5ML IV SOSY
PREFILLED_SYRINGE | INTRAVENOUS | Status: DC | PRN
  Administered 2023-12-23: 100 mg via INTRAVENOUS

## 2023-12-23 MED ORDER — LIDOCAINE HCL (PF) 2 % IJ SOLN
INTRAMUSCULAR | Status: AC
  Filled 2023-12-23: qty 5

## 2023-12-23 MED ORDER — OXYCODONE HCL 5 MG/5ML PO SOLN: 5.0000 mg | Freq: Once | ORAL | Status: DC | PRN

## 2023-12-23 MED ORDER — FENTANYL CITRATE PF 50 MCG/ML IJ SOSY: 25.0000 ug | PREFILLED_SYRINGE | INTRAMUSCULAR | Status: DC | PRN

## 2023-12-23 MED ORDER — ONDANSETRON HCL 4 MG/2ML IJ SOLN
INTRAMUSCULAR | Status: AC
  Filled 2023-12-23: qty 2

## 2023-12-23 MED ORDER — IOHEXOL 300 MG/ML  SOLN
INTRAMUSCULAR | Status: DC | PRN
  Administered 2023-12-23: 16 mL

## 2023-12-23 MED ORDER — PROPOFOL 10 MG/ML IV BOLUS
INTRAVENOUS | Status: DC | PRN
  Administered 2023-12-23: 130 mg via INTRAVENOUS

## 2023-12-23 SURGICAL SUPPLY — 20 items
BAG URO CATCHER STRL LF (MISCELLANEOUS) ×2 IMPLANT
BASKET LASER NITINOL 1.9FR (BASKET) IMPLANT
CATH URETL OPEN END 6FR 70 (CATHETERS) ×2 IMPLANT
CLOTH BEACON ORANGE TIMEOUT ST (SAFETY) ×2 IMPLANT
GLOVE SURG LX STRL 7.5 STRW (GLOVE) ×2 IMPLANT
GOWN STRL REUS W/ TWL XL LVL3 (GOWN DISPOSABLE) ×2 IMPLANT
GUIDEWIRE ANG ZIPWIRE 038X150 (WIRE) ×2 IMPLANT
GUIDEWIRE STR DUAL SENSOR (WIRE) ×2 IMPLANT
GUIDEWIRE ZIPWRE .038 STRAIGHT (WIRE) IMPLANT
KIT TURNOVER KIT A (KITS) ×2 IMPLANT
MANIFOLD NEPTUNE II (INSTRUMENTS) ×2 IMPLANT
PACK CYSTO (CUSTOM PROCEDURE TRAY) ×2 IMPLANT
SHEATH NAVIGATOR HD 11/13X28 (SHEATH) IMPLANT
SHEATH NAVIGATOR HD 11/13X36 (SHEATH) IMPLANT
STENT POLARIS 5FRX26 (STENTS) IMPLANT
TRACTIP FLEXIVA PULS ID 200XHI (Laser) IMPLANT
TRACTIP FLEXIVA PULSE ID 200 (Laser) IMPLANT
TUBE PU 8FR 16IN ENFIT (TUBING) ×2 IMPLANT
TUBING CONNECTING 10 (TUBING) ×2 IMPLANT
TUBING UROLOGY SET (TUBING) ×2 IMPLANT

## 2023-12-23 NOTE — Plan of Care (Signed)
  Problem: Pain Managment: Goal: General experience of comfort will improve and/or be controlled Outcome: Progressing   Problem: Fluid Volume: Goal: Ability to maintain a balanced intake and output will improve Outcome: Progressing   Problem: Metabolic: Goal: Ability to maintain appropriate glucose levels will improve Outcome: Progressing

## 2023-12-23 NOTE — Patient Instructions (Incomplete)
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 # Therapeutic drug monitoring # Nausea and diarrhea due to drug  - ILD is worse since 2022 but more stable recently -Symptoms are also stable -Currently on nintedanib 100 mg once daily with some  diarrhea - This is making you take nintedanib even the low-dose irregularly -  Plan - Take CAROB Flour for Diarrhea due to medication as follows Take 1 DESSERT spoon  size serving [approximately 7 g] before breakfast If still no response in 3 days then add another 7 g at dinner If still no response in 3 days then make it to spoon servings at breakfast and 2 spoon servings at dinner and hold NOTE: Always MIX the CAROB FLOUR with WATER or MILK or JUICE - MIGHT NEED A BLENDER to do it DO NOT EAT CAROB POWER DIRECTLY - it can choke or make you cough  - Try to  nintedanib to 100 mg twice daily with help pf carob  - at next visit we can see if new drug NERANDILOMLAST is approved    Cough variant asthma Associated eosinophilia   - 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

## 2023-12-23 NOTE — Anesthesia Procedure Notes (Signed)
 Procedure Name: LMA Insertion Date/Time: 12/23/2023 5:51 PM  Performed by: Vincenzo Show, CRNAPre-anesthesia Checklist: Patient identified, Emergency Drugs available, Suction available, Patient being monitored and Timeout performed Patient Re-evaluated:Patient Re-evaluated prior to induction Oxygen Delivery Method: Circle system utilized Preoxygenation: Pre-oxygenation with 100% oxygen Induction Type: IV induction Ventilation: Mask ventilation without difficulty LMA: LMA with gastric port inserted LMA Size: 4.0 Tube size: 4.0 mm Number of attempts: 1 Placement Confirmation: positive ETCO2 and breath sounds checked- equal and bilateral Tube secured with: Tape Dental Injury: Teeth and Oropharynx as per pre-operative assessment

## 2023-12-23 NOTE — Transfer of Care (Signed)
 Immediate Anesthesia Transfer of Care Note  Patient: William Howell  Procedure(s) Performed: CYSTOSCOPY/URETEROSCOPY/HOLMIUM LASER/STENT PLACEMENT (Right: Ureter)  Patient Location: PACU  Anesthesia Type:General  Level of Consciousness: awake, alert , and oriented  Airway & Oxygen Therapy: Patient Spontanous Breathing and Patient connected to face mask oxygen  Post-op Assessment: Report given to RN and Post -op Vital signs reviewed and stable  Post vital signs: Reviewed and stable  Last Vitals:  Vitals Value Taken Time  BP    Temp    Pulse 71 12/23/23 18:48  Resp 14 12/23/23 18:48  SpO2 100 % 12/23/23 18:48  Vitals shown include unfiled device data.  Last Pain:  Vitals:   12/23/23 1717  TempSrc:   PainSc: 6       Patients Stated Pain Goal: 3 (12/23/23 1113)  Complications: No notable events documented.

## 2023-12-23 NOTE — Progress Notes (Deleted)
 OV 05/16/2022 -transfer of care to the ILD center with Dr. Geronimo.    Subjective:  Patient ID: William Howell, male , DOB: 1941-02-14 , age 83 y.o. , MRN: [id number] , ADDRESS: [address] [place] [address] PCP Duanne Butler DASEN, MD Patient Care Team: Duanne Butler DASEN, MD as PCP - General (Family Medicine) Mona Vinie BROCKS, MD as PCP - Cardiology (Cardiology) Mona Vinie BROCKS, MD as Consulting Physician (Cardiology)  This Provider for this visit: Treatment Team:  Attending Provider: Geronimo Amel, MD    05/16/2022 -   Chief Complaint  Patient presents with   Consult    Cough,      HPI William Howell 83 y.o. -is maternal grandfather to Dr. Donnice Beals pulmonologist at [facility].  He is transferring his care to Dr. Geronimo [me] because of concern of interstitial lung disease.  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  He does a lot of heavy manual work.  He has significant exposures that because of his farm work.  He has also obesity and has sleep apnea.  He uses CPAP at night previously followed by Dr. MALVA (currently unclear who his new sleep doctor will be].  He reports dyspnea on exertion for at least the last 10-12 years but for the last 1 year it is progressive.  It is relieved by rest.  However since October 2023 has had a cough it initially got better but then November 2023 it came back again.  He recollects being treated for pneumonia.  Around this time in December 2023 a few days before Christmas he did have a CT scan of the chest that I personally visualized it does show right lower lobe density consistent with pneumonia].  Since then after treatment of pneumonia his cough is better but is still not back at baseline.  His shortness of breath is also not back at baseline.  Regarding his cough overall it was worse compared to a year ago although better compared to fall 2023.  He does bring out some occasional  clear to light yellow phlegm.  He does cough at night.  He does also have wheezing with the cough he does clear the throat but there is no hemoptysis.  The cough does not get worse when he lies down.  There is no tickle in his throat.  [place] [facility] Comprehensive ILD Questionnaire  Symptoms:   Past Medical History :  -Obesity -Sleep apnea on CPAP -Review of the external records indicate that in 2019 he was being treated for asthma by Dr. Vicenta Lennert. -Denies any connective tissue disease -Has hypertension Pneumonia right lower lobe December 2023 -He has had both COVID-vaccine and COVID disease but was never hospitalized for COVID.   ROS:  --Fatigue Shortness of breath Chronic cough Some weight loss Does not snore because of CPAP   FAMILY HISTORY of LUNG DISEASE:  -No family history of pulmonary fibrosis but his father did have COPD  PERSONAL EXPOSURE HISTORY:  -Non-smoker.  No marijuana use.  No cocaine use no intravenous drug use  HOME  EXPOSURE and HOBBY DETAILS :  -Lives and is fine to take a farm house single[place].  The home is 83 years old.  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  Since 1970s has been active farming.  He has been exposed to working in [employer].  Is been a daily farmer a lot of chemical exposure.  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  He is also done sewage work.  None animal feed production.  Been a consideration is.  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  Has done foundry work, Microbiologist, Management consultant, Location manager, woodwork presents on 5 working.   Has been exposed to pesticides.  PULMONARY TOXICITY HISTORY (27 items):  -He has been on and off prednisone  for years but otherwise detailed pulmonary drug toxicity is negative  INVESTIGATIONS: -Last pulmonary function test in 2017 with reduced FVC but normal DLCO.  This is consistent with obesity. -CT chest without contrast December 2023:  -He does have right lower lobe pneumonia  -He does have ILD that I suspect is in a pattern not consistent with UIP/alternative to UIP pattern  -Onset: In 2017 CT scan of the chest he did not seem to have it v GGO/Air trapping  However 2022 he has some ILD changes. -   CT Chest data - CTWO contrast 04/04/22  Narrative & Impression  CLINICAL DATA:  Follow-up lung nodule seen on radiographs   EXAM: CT CHEST WITHOUT CONTRAST   TECHNIQUE: Multidetector CT imaging of the chest was performed following the standard protocol without IV contrast.   RADIATION DOSE REDUCTION: This exam was performed according to the departmental dose-optimization program which includes automated exposure control, adjustment of the mA and/or kV according to patient size and/or use of iterative reconstruction technique.   COMPARISON:  Radiographs 03/19/2022 and CTA chest 09/28/2021   FINDINGS: Cardiovascular: Normal heart size. No pericardial effusion. Aortic atherosclerotic calcification. The ascending aorta measures approximately 44 mm in diameter (coronal image 86) this previously measured 41 mm on CTA 09/28/2021 though direct comparison is limited by lack of IV contrast.   Mediastinum/Nodes: Increased size of multiple mediastinal lymph nodes. For example a right paratracheal node measures 1.0 cm (3/33), previously 0.6 cm. Unremarkable esophagus.   Lungs/Pleura: Patchy reticulonodular opacities and interstitial thickening are slightly progressed from 09/28/2021. Increased bronchial wall thickening and bronchiectasis/bronchiolectasis greatest in the right lower  lobe. Focal atelectasis/consolidation in the posterior right lower lobe. No pleural effusion or pneumothorax. No CT correlate for nodule seen on radiographs 03/19/2022.   Upper Abdomen: Cholecystectomy. Colonic diverticulosis without diverticulitis. No acute abnormality   Musculoskeletal: No chest wall mass or suspicious bone lesions identified.   IMPRESSION: 1. Pulmonary findings compatible with bronchitis/bronchiolitis with right lower lobe pneumonia. 2. Similar to slight progression of interstitial lung disease compared with 09/28/2021. 3. Ascending aortic aneurysm measures 44 mm in diameter on today's exam. This may be slightly increased from 09/28/2021 however comparison is difficult given differences in obliquity and lack of IV contrast. Continued annual imaging follow-up with CTA or MRA is recommended.     Electronically Signed   By: Norman Gatlin M.D.   On: 04/08/2022 00:02     OV 06/28/2022  Subjective:  Patient ID: William Howell, male , DOB: 02-13-1941 , age 43 y.o. , MRN: [id number] , ADDRESS: [address] [place] [address] PCP Duanne Butler DASEN, MD Patient Care Team: Duanne Butler DASEN, MD as PCP - General (Family Medicine) Mona Vinie BROCKS, MD as PCP - Cardiology (  Cardiology) Mona Vinie BROCKS, MD as Consulting Physician (Cardiology)  This Provider for this visit: Treatment Team:  Attending Provider: Geronimo Amel, MD  ILD workup in progress  06/28/2022 -   Chief Complaint  Patient presents with   Follow-up    COVID 05/28/2022.  Cough with yellow mucus.  SOB with exertion.   ILD workup in pgoress.  HPI William Howell 83 y.o. -here with wife to review CT, PFT and serology. Says In interim had dental extraction and after that has had dimnished PO and lost 27#. Now a week ago started Ozempoc. HE is hoping to lose more weight but is already having some diarrhea./ Also mid feb 2024 had a bout of covid that was mild though is having some  small amout of cough with yellow sputum .  His PFTs show mild decline in FVC and DLCO in 7 year. . Visualization of images - I Agree is features are NOT cw UIP, Agree is more c/w HP or NSIP. Agree is mild. However, I am not sure of radiologic progression.   We decissused that biopsy is indicated but I did recommend that risk for SLB is > benefit. Did indicate at some point we can consider bronch biopy but at this point was to sort out if there is progerssion on CT . PFTs ae suggesting progression. On radiology repot (Dec 2023 suggested progression) but the current 2024 reporrt is equivocal. If there is progression, would recommend anti-fibrotic but did indicate to him tht markers of ILD for him are indicative of mild burden and likely only very slow progression. 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 Exposed to chemicals, mulch, fertilizer. First HRCT 2017, Some other CTs in 2022 and 2023. He has dyspnea on exertion x 12  years but progressive x 1 year.  End of 2023 got Rx for PNA>  but this could be all obesity. His PFTs 2017 -> 2023 show decline 2.86L/70% -> 2.14L/55%. DLCO: has gone from 26.9 -> 19.9 in same period but both listed as normal > 80%. Need to figure out if he should go an anti-fibrotic so need to see CT and see if he has progressive phenotype on ILD because symptoms could be from obesity  and also because overall burden of ILD is mild.   - Date or time period of scan:  HRCT: 06/12/2022 HRCT: 01/15/2016 - RAST allergy  neg 2017  - Discussion synopsis:  Feb 2024: ILD + , Upper lobe pednomnance, Primary feature is ground glass,  There is volume loss and archietctural distorion in Upper Lobe.   - What is the final conclusion per 2018 ATS/Fleischner Criteria - Alternative Diagnosis. Dx #1 is Chronic HP.  and worse since 2017 slowly. Also, in current FEb 2024 there is some acue process with nodularity - cocerconsuggestive of acute atypical process such as MAC   - Concordance with official report: DISCORDANT  Pathology discussion of biopsy: No lung biopsy   MDD Impression/Recs:   - Rx as HP - steroid can be problematic. Rx as progressive phentopye with anti-fibrotic. Also do bronc with BAL but no need for TBbx  OV 08/20/2022  Subjective:  Patient ID: William Howell, male , DOB: Dec 08, 1940 , age 89 y.o. , MRN: [id number] , ADDRESS: [address] [place] [address] PCP Duanne Butler DASEN, MD Patient Care Team: Duanne Butler DASEN, MD as PCP - General (Family Medicine) Mona Vinie BROCKS, MD as PCP - Cardiology (Cardiology) Mona Vinie BROCKS, MD as Consulting Physician (Cardiology)  This Provider for this visit: Treatment Team:  Attending Provider: Geronimo Amel, MD    08/20/2022 -   Chief Complaint  Patient presents with   Follow-up    F/up  HPI William Howell 83 y.o. -returns for follow-up.  He says after the last visit and treating with antibiotic and prednisone  his cough did improve but is still there.  He does not wake up because of the cough at night but it does wake his wife up.  He says it is a little bit better but he does still have yellow phlegm.  Review of the labs indicate in 2017 RAST allergy  panel was normal.  His blood eosinophils are always elevated.  His exam nitric oxide  is slightly up.  His MAR shows that he is supposed to be taking Symbicort  but he does not recollect taking it.  MAR also shows him to be taking zafirlukast  but he is not sure if he is taking it.  I asked him to restart his Symbicort .  Regarding his ILD: We discussed with the case conference and the consensus was  that the overall disease burden is mild but it is definitely progressive over many years.  The thought process is that he has hypersensitive pneumonitis.  Steroids was deemed risk particularly over the long-term because of his multiple comorbidities, obesity and his age.  Surgical lung biopsy also would be risky.  Bronchoscopy could be considered an antifibrotic could be considered.  Last visit I thought you should go with pirfenidone because he was on Ozempic  and he was having some diarrhea.  However now he is off Ozempic  because of the side effects.  He did lose weight.  He feels he is gaining weight without the Ozempic .  He will talk to his primary care about other drugs.  We discussed the consensus in the conference that antifibrotic should be indicated for him even though the disease burden is very mild because it is progressive slowly.  We discussed pirfenidone versus nintedanib.  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  We did talk about the diarrhea risk and also extremely high cardiac risk.  Overall shared decision making to take nintedanib.  Decided to start with a low-dose protocol and then slowly escalate.  Also discussed avoidance measures for mold.  He states he is to be exposed heavily to hay and mold and chemicals and fertilizer.  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  I advised him to be diligent about avoiding these exposures.  He is in agreement.   09/30/2022- Interim hx  Patient presents today for 6 week follow-up/new OFEV  start. He was started on low dose Nintedanib 100mg  twice daily in early May. He has progressive ILD, symptom burden is mild. 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   Breathing is baseline, no changes. He has no acute complaints today. He has not been coughing like he was. FENO was elevated at previous visit indicating allergic component contributing to cough. He has used Symbicort  a couple of times but does not  require daily.  OV 11/25/2022  Subjective:  Patient ID: William Howell, male , DOB: 1940-08-30 , age 80 y.o. , MRN: [id number] , ADDRESS: [address] [place] [address] PCP Duanne Butler DASEN, MD Patient Care Team: Duanne Butler DASEN, MD as PCP - General (Family Medicine) Mona Vinie BROCKS, MD as PCP - Cardiology (Cardiology) Mona Vinie BROCKS, MD as Consulting Physician (Cardiology)  This Provider for this visit: Treatment Team:  Attending Provider: Geronimo Amel, MD   11/25/2022 -   Chief Complaint  Patient presents with   Follow-up    F/up on PFT, BP high since starting ofev , diarrhea, and weight gain more the 2 lbs.      HPI William Howell 83 y.o. -presents for follow-up.  He is here with his wife who is an independent historian.  He had to go to the beach and so he delayed starting his nintedanib.  Has been taking it for 2 weeks now on the low-dose protocol 100 mg twice daily.  He had diarrhea initially but this resolved.  He is concerned that his unintended might be increasing his blood pressure.  Last night was 170 systolic.  This morning after he took his losartan  and after the morning dose nintedanib here in our office it is normal.  However he does not check his night blood pressure on a consistent basis.  He checked it randomly.  We do not know if prenintedanib his night blood pressure was going up because he never checked it.  He also uses a wrist cuff monitor.  Therefore I shared with him the some uncertainty whether his nintedanib is really causing hypertension although I did admit that nintedanib can increase the blood pressure but it is an uncommon side effect.  Currently is not having any cough.  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  He quit taking his Symbicort  for no particular reason.  I recommended he restart it.  And also use a mask with his farming work.  He had pulmonary function test today and is stable.  Symptom scores are  stable.      OV 01/21/2023  Subjective:  Patient ID: William Howell, male , DOB: 19-Jul-1940 , age 80 y.o. , MRN: [id number] , ADDRESS: [address] [place] [address] PCP Duanne Butler DASEN, MD Patient Care Team: Duanne Butler DASEN, MD as PCP - General (Family Medicine) Mona Vinie BROCKS, MD as PCP - Cardiology (Cardiology) Mona Vinie BROCKS, MD as Consulting Physician (Cardiology)  This Provider for this visit: Treatment Team:  Attending Provider: Geronimo Amel, MD    01/21/2023 -   Chief Complaint  Patient presents with   Follow-up    Pt states he has not been well the last few days, cough, wheezing. Pt has not been using his albuterol  inhaler or symbicort .    -Hypersensitivity progressive phenotype  -Wheezing phenotype not taking his Symbicort .  High blood eosinophils but normal RAST panel  - Nintedanib/Ofev  requires intensive drug monitoring due to high concerns for Adverse effects of , including  Drug Induced Liver Injury, significant GI side effects that include but not limited to Diarrhea, Nausea, Vomiting,  and other system side effects that include Fatigue,  weight loss. Cardiac side effects are a black box warning as well. These will be monitored with  blood work such as LFT initially once a month for 6 months and then quarterly   HPI William Howell 82 y.o. 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  He is here with  his wife she is an independent historian.  He continues on nintedanib but he is having significant side effects.  He says initially they came on but resolved but for the last few weeks the back.  For the last few days he is only taking 100 mg once daily.  The symptoms are decreased appetite decreased p.o. intake decreased taste increased diarrhea and also flatulence.  He did pulmonary function test today and is stable through the course of this year.  In addition for the last 5 days is reporting increased cough increase  sputum.  Change in color of sputum to yellow.  He is increased his farming activities for the fall.  He does unique technque Education administrator crop planting.  This increases the nitrogen content of the soil.  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  He says he is using a cover tractor.  There are no sick contacts.  The weather is also changes no fall in this range.  But it is unclear why he is having the symptoms.  He is got active wheezing today wife is really concerned about this he is also concerned about this.  He is not taking his Symbicort .  This because it was expensive.  Will try to get another inhaler for him.  He does have some samples of Breztri .    OV 04/21/2023  Subjective:  Patient ID: William Howell, male , DOB: [birth year] , age 43 y.o. , MRN: [id number] , ADDRESS: [address] [place] [address] PCP Duanne Butler DASEN, MD Patient Care Team: Duanne Butler DASEN, MD as PCP - General (Family Medicine) Mona Vinie BROCKS, MD as PCP - Cardiology (Cardiology) Mona Vinie BROCKS, MD as Consulting Physician (Cardiology)  This Provider for this visit: Treatment Team:  Attending Provider: Geronimo Amel, MD  24  04/21/2023 -   Chief Complaint  Patient presents with   Follow-up    Getting over a cold. He states today his breathing stable.    Type of visit: Video Virtual Visit Identification of patient William Howell with 10-25-1940 and MRN [id number] - 2 person identifier Risks: Risks, benefits, limitations of telephone visit explained. Patient understood and verbalized agreement to proceed Anyone else on call: just wife Patient location: his home This provider location: [address], [address]; [place]; [place] [address]. [facility]. [phone]   HPI William Howell 83 y.o. -at last visit he was in a wheezing exacerbation.  Z-Pak and prednisone  was called in.  We checked his blood eosinophils and it was high.  Therefore I  recommended Dupixent or another biologic.  However, I do not see any evidence of further follow-up.  He did get some Levaquin  in mid December for 2024 through primary care physician based on chart review.  He also had a cough at that time and was given Hycodan cough syrup.  His blood eosinophils have been quite high between 500 and 600 cells per cubic millimeter in the year 2024 as recently as October 2024.  Currently is doing well except that he is having nausea with nintedanib.  He tells me that after some in October 2024 he gave her 2-week holiday with the nintedanib and then restarted the low-dose 100 mg twice daily but he is only taking it at once daily.  Even with this is some mild nausea for 30 minutes and he is having some mild diarrhea.  He is willing to rechallenge himself to twice daily.  We talked about taking Zofran  and he is agreeable.  I sent in the prescription.  Also talked about his high eosinophil load and recommended biologic.  We have settled on Fasenra  because of the schedule.  Have sent a message to our pharmacy team.  His wife is also part of the conversation.  Although she was not an independent historian today.  He has upcoming CT chest in February but no office visit.     OV 09/15/2023  Subjective:  Patient ID: William Howell, male , DOB: 1940/05/26 , age 72 y.o. , MRN: [id number] , ADDRESS: [address] [place] [address] PCP Duanne Butler DASEN, MD Patient Care Team: Duanne Butler DASEN, MD as PCP - General (Family Medicine) Mona Vinie BROCKS, MD as PCP - Cardiology (Cardiology) Mona Vinie BROCKS, MD as Consulting Physician (Cardiology)  This Provider for this visit: Treatment Team:  Attending Provider: Geronimo Amel, MD    09/15/2023 -   Chief Complaint  Patient presents with   Interstitial Lung Disease     HPI William Howell 83 y.o. -returns for follow-up.  Presents with his wife.  He is now on Fasenra  since I last saw him and with this the cough  is nearly resolved.  He is pretty happy about that.  In terms of his dyspnea on exertion it is stable.  See symptom score below.  He is unable to tolerate his nintedanib even at the low-dose.  He is taking 1 tablet a day on alternate days and twice daily on alternate days.  This because of diarrhea.  Does not much of nausea.  We discussed the role of CAROB flour and controlling diarrhea and he is willing to try this.  This is to help with compliance.  If this does not work hopefully in the next few months new medication Nerandomilast might be approved.  And he is willing to consider that.  In terms of disease progression he had a high-resolution CT chest that I personally visualized and at least in recent times it seems more organized and stable.  Although over time he has progressed.  He had liver function test earlier this month and I personally visualized it and it is normal.  CT Chest data from date: feb 2024  - personally visualized and independently interpreted : yes - my findings are: agree   IMPRESSION: 1. Pulmonary parenchymal pattern of interstitial lung disease, as detailed above, possibly more organized than on 09/20/2022 and progressive from 09/28/2020. 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 Findings are suggestive of an alternative diagnosis (not UIP) per consensus guidelines: Diagnosis of Idiopathic Pulmonary Fibrosis: An Official ATS/ERS/JRS/ALAT Clinical Practice Guideline. Am JINNY [employer] Crit Care Med Vol 198, Iss 5, [id number], Dec 14 2016. 2. 4.4 cm ascending aortic aneurysm, stable. Recommend annual imaging followup by CTA or MRA. This recommendation follows 2010 ACCF/AHA/AATS/ACR/ASA/SCA/SCAI/SIR/STS/SVM Guidelines for the Diagnosis and Management of Patients with Thoracic Aortic Disease. Circulation. 2010; 121: Z733-z630. Aortic aneurysm NOS (ICD10-I71.9). 3. Tiny left renal stones. 4.  Aortic atherosclerosis (ICD10-I70.0).     Electronically  Signed   By: Newell Eke M.D.   On: 06/09/2023 08:20        OV 12/23/2023  Subjective:  Patient ID: William Howell, male , DOB: May 07, 1940 , age 25 y.o. , MRN: [id number] , ADDRESS: [address] [place] [address] PCP Duanne Butler DASEN, MD Patient Care Team: Duanne Butler DASEN, MD as PCP - General (Family Medicine) Mona Vinie BROCKS, MD as PCP -  Cardiology (Cardiology) Mona Vinie BROCKS, MD as Consulting Physician (Cardiology) Geronimo Amel, MD as Consulting Physician (Pulmonary Disease) Charmayne Molly, MD as Consulting Physician (Ophthalmology)  This Provider for this visit: Treatment Team:  Attending Provider: Geronimo Amel, MD    ILD workup in progress: Post multidisciplinary case conference diagnosis hypersensitive pneumonitis from farming  -Disease burden is mild  Also chronic cough  -Normal RAST allergy  testing 2017  -Elevated nitric oxide  May 2024  - Elevated eosinophils - 500 cells in feb 2024, 600 cells Oct 2024  - STRT FASENRA  early sprin 2025    Morbid obesity  -Previously on Ozempic  and stopped because of side effects; early 20   #Nintedanib/Ofev  requires intensive drug monitoring due to high concerns for Adverse effects of , including  Drug Induced Liver Injury, significant GI side effects that include but not limited to Diarrhea, Nausea, Vomiting,  and other system side effects that include Fatigue,  weight loss. Cardiac side effects are a black box warning as well. These will be monitored with  blood work such as LFT initially once a month for 6 months and then quarterly   12/23/2023 -  No chief complaint on file.    HPI William Howell 83 y.o. -     SYMPTOM SCALE - ILD 05/16/2022 06/28/2022   08/20/2022 272# 09/30/2022  11/25/2022  01/21/2023 ofev  09/15/2023 Ofev  irregular + fasernal since spring 2025  Current weight            O2 use ra ra ra RA  ra ra ra  Shortness of Breath 0 -> 5 scale with 5 being worst (score 6 If unable to  do)       2   At rest 1 1 3  0 2 2 3   Simple tasks - showers, clothes change, eating, shaving 2 2 3 3 3 2 3   Household (dishes, doing bed, laundry) 4 3 3 3 4 3 4   Shopping 3 3 4 3 3 3 4   Walking level at own pace 4 3 4 3 3 3 3   Walking up Stairs 4 3 4 4 4 4 4   Total (30-36) Dyspnea Score 11 15 21 16 19 17 21      Non-dyspnea symptoms (0-> 5 scale) 05/16/2022 06/28/2022  08/20/2022  11/25/2022  01/21/2023  09/15/2023   How bad is your cough? 3 2 3 2 3 2   How bad is your fatigue 4 3 - Rx predn/abx 3 3 2 3   How bad is nausea 0 0 0 2 0 2  How bad is vomiting?  0 0 0 0 0 1  How bad is diarrhea? 0 0 - mild after ozempic  start a wekk aog 0 1 3 3   How bad is anxiety? 1 0 0  0 2  How bad is depression 1 0 0  0 2  Any chronic pain - if so where and how bad 1 0 0        Simple office walk 224 (66+46 x 2) feet Pod A at [facility] x  3 laps goal with forehead probe 08/20/2022    O2 used ra   Number laps completed 15 x sit stand   Comments about pace good   Resting Pulse Ox/HR 97% and 79/min   Final Pulse Ox/HR 98% and 94/min   Desaturated </= 88% no   Desaturated <= 3% points no   Got Tachycardic >/= 90/min yes   Symptoms at end of test Level 4 dyspnea of 10  Miscellaneous comments x        SIT STAND TEST - goal 15 times   09/15/2023    O2 used ra   PRobe - finter or forehead finger   Number sit and stand completed - goal 15 15   Time taken to complete Did not measure   Resting Pulse Ox/HR/Dyspnea  97% and 63/min and dyspnea of 0/10    Peak measures 98 % and 96/min and dyspnea of 2/10   Final Pulse Ox/HR 98% and 70/min and dyspnea of 3/10   Desaturated </= 88% no   Desaturated <= 3% points no   Got Tachycardic >/= 90/min yes   Miscellaneous comments Nrmal pace     CT Chest data from date: ****  - personally visualized and independently interpreted : *** - my findings are: ***   PFT     Latest Ref Rng & Units 01/21/2023   11:30 AM 11/20/2022    8:57 AM 06/28/2022    9:00 AM  12/12/2015   10:53 AM  PFT Results  FVC-Pre L 2.32  2.40  2.14  2.86   FVC-Predicted Pre % 71  63  55  70   FVC-Post L   2.20    FVC-Predicted Post %   57    Pre FEV1/FVC % % 76  86  77  80   Post FEV1/FCV % %   85    FEV1-Pre L 1.76  2.08  1.64  2.28   FEV1-Predicted Pre % 77  78  60  77   FEV1-Post L   1.86    DLCO uncorrected ml/min/mmHg 20.39  22.11  19.66  26.92   DLCO UNC% % 96  94  83  86   DLCO corrected ml/min/mmHg 20.39  22.11  20.45    DLCO COR %Predicted % 96  94  86    DLVA Predicted % 147  135  131  122   TLC L   4.38  5.07   TLC % Predicted %   63  74   RV % Predicted %   70  73        LAB RESULTS last 96 hours CT ABDOMEN PELVIS WO CONTRAST Result Date: 12/22/2023 CLINICAL DATA:  Abdominal pain, fever, UTI/prostatitis. EXAM: CT ABDOMEN AND PELVIS WITHOUT CONTRAST TECHNIQUE: Multidetector CT imaging of the abdomen and pelvis was performed following the standard protocol without IV contrast. RADIATION DOSE REDUCTION: This exam was performed according to the departmental dose-optimization program which includes automated exposure control, adjustment of the mA and/or kV according to patient size and/or use of iterative reconstruction technique. COMPARISON:  01/16/2019. FINDINGS: Lower chest: Basilar fibrotic interstitial lung disease, better evaluated on CT chest 05/22/2023. Heart is at the upper limits of normal in size. No pericardial or pleural effusion. Distal esophagus is grossly unremarkable. Hepatobiliary: Liver is grossly unremarkable. Cholecystectomy. No biliary ductal dilatation. Pancreas: Negative. Spleen: Negative. Adrenals/Urinary Tract: Adrenal glands are unremarkable. Tiny bilateral renal stones. There may be a subcentimeter low-attenuation lesion in the upper pole right kidney, too small to characterize. No specific follow-up necessary. Kidneys are otherwise unremarkable. Mild right hydronephrosis secondary to a 4 mm stone in the distal right ureter (2/71). Left  ureter is decompressed. Bladder is relatively low in volume. Stomach/Bowel: Stomach, small bowel, appendix and colon are unremarkable. Vascular/Lymphatic: Atherosclerotic calcification of the aorta. No pathologically enlarged lymph nodes. Reproductive: Prostate is normal in size. Other: Small bilateral inguinal hernias contain fat. No free fluid. Mesenteries and peritoneum  are unremarkable. Musculoskeletal: Degenerative changes in the spine. IMPRESSION: 1. Mild right hydronephrosis secondary to a 4 mm stone in the distal right ureter. 2. Bilateral renal stones. 3. Basilar fibrotic interstitial lung disease, better evaluated on CT chest 05/22/2023. 4.  Aortic atherosclerosis (ICD10-I70.0). Electronically Signed   By: Newell Eke M.D.   On: 12/22/2023 14:59         has a past medical history of Allergy , Asthma, Diabetes mellitus type II, controlled (HCC), GERD (gastroesophageal reflux disease), Hypertension, Obesity, and Sleep apnea.   reports that he has never smoked. He has quit using smokeless tobacco.  His smokeless tobacco use included chew.  Past Surgical History:  Procedure Laterality Date   CHOLECYSTECTOMY     DENTAL SURGERY      No Known Allergies  Immunization History  Administered Date(s) Administered   Fluad Quad(high Dose 65+) 01/04/2019, 03/27/2020, 04/22/2022   Fluad Trivalent(High Dose 65+) 02/11/2023   INFLUENZA, HIGH DOSE SEASONAL PF 02/17/2017   Influenza,inj,Quad PF,6+ Mos 02/10/2014, 03/14/2015, 01/09/2016, 02/12/2018   Influenza-Unspecified 02/12/2018   PFIZER(Purple Top)SARS-COV-2 Vaccination 07/10/2019, 08/04/2019   Pneumococcal Conjugate-13 07/04/2014   Pneumococcal Polysaccharide-23 12/26/2011   Td 12/26/2011   Tdap 12/26/2011, 08/15/2022   Zoster Recombinant(Shingrix) 02/20/2021, 04/18/2021   Zoster, Live 02/10/2014    Family History  Problem Relation Age of Onset   Heart disease Father    Heart attack Father    Heart attack Sister    Colon cancer  Neg Hx    Colon polyps Neg Hx    Esophageal cancer Neg Hx    Rectal cancer Neg Hx    Stomach cancer Neg Hx    Pancreatic cancer Neg Hx    Liver cancer Neg Hx    Prostate cancer Neg Hx     No current facility-administered medications for this visit. No current outpatient medications on file.  Facility-Administered Medications Ordered in Other Visits:    acetaminophen  (TYLENOL ) tablet 650 mg, 650 mg, Oral, Q6H PRN **OR** acetaminophen  (TYLENOL ) suppository 650 mg, 650 mg, Rectal, Q6H PRN, Opyd, Timothy S, MD   albuterol  (PROVENTIL ) (2.5 MG/3ML) 0.083% nebulizer solution 2.5 mg, 2.5 mg, Nebulization, Q6H PRN, Opyd, Timothy S, MD   budesonide -glycopyrrolate -formoterol  (BREZTRI ) 160-9-4.8 MCG/ACT inhaler 2 puff, 2 puff, Inhalation, BID, Opyd, Timothy S, MD   cefTRIAXone  (ROCEPHIN ) 2 g in sodium chloride  0.9 % 100 mL IVPB, 2 g, Intravenous, Q24H, Opyd, Timothy S, MD   fentaNYL  (SUBLIMAZE ) injection 12.5-50 mcg, 12.5-50 mcg, Intravenous, Q2H PRN, Opyd, Timothy S, MD   insulin  aspart (novoLOG ) injection 0-6 Units, 0-6 Units, Subcutaneous, Q4H, Opyd, Timothy S, MD   lactated ringers  infusion, , Intravenous, Continuous, Regalado, Belkys A, MD   montelukast  (SINGULAIR ) tablet 10 mg, 10 mg, Oral, QHS, Opyd, Timothy S, MD, 10 mg at 12/22/23 2204   oxyCODONE  (Oxy IR/ROXICODONE ) immediate release tablet 5 mg, 5 mg, Oral, Q4H PRN, Opyd, Timothy S, MD   pantoprazole  (PROTONIX ) EC tablet 40 mg, 40 mg, Oral, Daily, Opyd, Timothy S, MD   prochlorperazine  (COMPAZINE ) injection 5 mg, 5 mg, Intravenous, Q6H PRN, Opyd, Timothy S, MD   senna (SENOKOT) tablet 8.6 mg, 1 tablet, Oral, Daily PRN, Opyd, Evalene RAMAN, MD   sodium chloride  flush (NS) 0.9 % injection 3 mL, 3 mL, Intravenous, Q12H, Opyd, Evalene RAMAN, MD, 3 mL at 12/22/23 2203   tamsulosin  (FLOMAX ) capsule 0.4 mg, 0.4 mg, Oral, Daily, Opyd, Evalene RAMAN, MD      Objective:   There were no vitals filed for this visit.  Estimated body mass index is 39.25  kg/m as calculated from the following:   Height as of 12/22/23: 5' 6 (1.676 m).   Weight as of 12/22/23: 243 lb 2.7 oz (110.3 kg).  @WEIGHTCHANGE @  There were no vitals filed for this visit.   Physical Exam   General: No distress. *** O2 at rest: *** Cane present: *** Sitting in wheel chair: *** Frail: *** Obese: *** Neuro: Alert and Oriented x 3. GCS 15. Speech normal Psych: Pleasant Resp:  Barrel Chest - ***.  Wheeze - ***, Crackles - ***, No overt respiratory distress CVS: Normal heart sounds. Murmurs - *** Ext: Stigmata of Connective Tissue Disease - *** HEENT: Normal upper airway. PEERL +. 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 # Therapeutic drug monitoring # Nausea and diarrhea due to drug  - ILD is worse since 2022 but more stable recently -Symptoms are also stable -Currently on nintedanib 100 mg once daily with some  diarrhea - This is making you take nintedanib even the low-dose irregularly -  Plan - Take CAROB Flour for Diarrhea due to medication as follows Take 1 DESSERT spoon  size serving [approximately 7 g] before breakfast If still no response in 3 days then add another 7 g at dinner If still no response in 3 days then make it to spoon servings at breakfast and 2 spoon servings at dinner and hold NOTE: Always MIX the CAROB FLOUR with WATER or MILK or JUICE - MIGHT NEED A BLENDER to do it DO NOT EAT CAROB POWER DIRECTLY - it can choke or make you cough  - Try to  nintedanib to 100 mg twice daily with help pf carob  - at next visit we can see if new drug NERANDILOMLAST is approved    Cough variant asthma Associated eosinophilia   - 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    SIGNATURE    Dr. Dorethia Cave, M.D., F.C.C.P,  Pulmonary and Critical Care Medicine Staff Physician, [facility] Director - Interstitial Lung Disease  Program  Pulmonary Fibrosis [facility] at [facility] [place], [place], [address]  Pager: [phone], If no answer or between  15:00h - 7:00h: call [phone] Telephone: [phone]  8:16 AM 12/23/2023   Moderate Complexity MDM OFFICE  2021 E/M guidelines, first released in 2021, with minor revisions added in 2023 and 2024 Must meet the requirements for 2 out of 3 dimensions to qualify.    Number and complexity of problems addressed Amount and/or complexity of data reviewed Risk of complications and/or morbidity  One or more chronic illness with mild exacerbation, OR progression, OR  side effects of treatment  Two or more stable chronic illnesses  One undiagnosed new problem with uncertain prognosis  One acute illness with systemic symptoms   One Acute complicated injury Must meet the requirements for 1 of 3 of the categories)  Category 1: Tests and documents, historian  Any combination of 3 of the following:  Assessment requiring an independent historian  Review of prior external note(s) from  each unique source  Review of results of each unique test  Ordering of each unique test    Category 2: Interpretation of tests   Independent interpretation of a test performed by another physician/other qualified health care professional (not separately reported)  Category 3: Discuss management/tests  Discussion of management or test interpretation with external physician/other qualified health care professional/appropriate source (not separately reported) Moderate risk of morbidity from additional diagnostic testing or treatment Examples only:  Prescription drug management  Decision regarding minor surgery with  identfied patient or procedure risk factors  Decision regarding elective major surgery without identified patient or procedure risk factors  Diagnosis or treatment significantly limited by social determinants of health             HIGh Complexity  OFFICE   2021 E/M guidelines, first released in 2021, with minor revisions added in 2023. Must meet the requirements for 2 out of 3 dimensions to qualify.    Number and complexity of problems addressed Amount and/or complexity of data reviewed Risk of complications and/or morbidity  Severe exacerbation of chronic illness  Acute or chronic illnesses that may pose a threat to life or bodily function, e.g., multiple trauma, acute MI, pulmonary embolus, severe respiratory distress, progressive rheumatoid arthritis, psychiatric illness with potential threat to self or others, peritonitis, acute renal failure, abrupt change in neurological status Must meet the requirements for 2 of 3 of the categories)  Category 1: Tests and documents, historian  Any combination of 3 of the following:  Assessment requiring an independent historian  Review of prior external note(s) from each unique source  Review of results of each unique test  Ordering of each unique test    Category 2: Interpretation of tests    Independent interpretation of a test performed by another physician/other qualified health care professional (not separately reported)  Category 3: Discuss management/tests  Discussion of management or test interpretation with external physician/other qualified health care professional/appropriate source (not separately reported)  HIGH risk of morbidity from additional diagnostic testing or treatment Examples only:  Drug therapy requiring intensive monitoring for toxicity  Decision for elective major surgery with identified pateint or procedure risk factors  Decision regarding hospitalization or escalation of level of care  Decision  for DNR or to de-escalate care   Parenteral controlled  substances            LEGEND - Independent interpretation involves the interpretation of a test for which there is a CPT code, and an interpretation or report is customary. When a review and interpretation of a test is performed and documented by the provider, but not separately reported (billed), then this would represent an independent interpretation. This report does not need to conform to the usual standards of a complete report of the test. This does not include interpretation of tests that do not have formal reports such as a complete blood count with differential and blood cultures. Examples would include reviewing a chest radiograph and documenting in the medical record an interpretation, but not separately reporting (billing) the interpretation of the chest radiograph.   An appropriate source includes professionals who are not health care professionals but may be involved in the management of the patient, such as a Clinical research associate, upper officer, case manager or teacher, and does not include discussion with family or informal caregivers.    - SDOH: SDOH are the conditions in the environments where people are born, live, learn, work, play, worship, and age that affect a wide range  of health, functioning, and quality-of-life outcomes and risks. (e.g., housing, food insecurity, transportation, etc.). SDOH-related Z codes ranging from Z55-Z65 are the ICD-10-CM diagnosis codes used to document SDOH data Z55 - Problems related to education and literacy Z56 - Problems related to employment and unemployment Z57 - Occupational exposure to risk factors Z58 - Problems related to physical environment Z59 - Problems related to housing and economic circumstances [phone] - Problems related to social environment [phone] - Problems related to upbringing [phone] - Other problems related to primary support group, including family circumstances Z21 -  Problems related to certain psychosocial circumstances Z65 - Problems related to other psychosocial circumstances

## 2023-12-23 NOTE — Progress Notes (Signed)
 Subjective/Chief Complaint:  1 - RIGHT Ureteral / Renal STones - 4mm Rt distal and 4mm lower pole stone on ER CT 12/22/23 on eval pelvic pain and acute renal failure.  2 - Acute Renal Failure - cr 2.4 up from baseline 1.2 on ER labs 9/8.   Today William Howell is seen to proceed with RIGHT ureteroscopy for multifocal Rt sided stones with ARF. UA without infectious parameters, lactate normal. Has been on empiric rocephin .   Objective: Vital signs in last 24 hours: Temp:  [97.6 F (36.4 C)-98.1 F (36.7 C)] 97.9 F (36.6 C) (09/09 0904) Pulse Rate:  [55-74] 57 (09/09 0904) Resp:  [17-24] 20 (09/09 0904) BP: (97-142)/(52-73) 121/52 (09/09 0904) SpO2:  [93 %-98 %] 95 % (09/09 0904) Weight:  [110.3 kg] 110.3 kg (09/08 2118) Last BM Date : 12/22/23  Intake/Output from previous day: 09/08 0701 - 09/09 0700 In: 2982.7 [I.V.:882.7; IV Piggyback:2100] Out: 300 [Urine:300] Intake/Output this shift: No intake/output data recorded.  NAD, AOx3 Non-labored breathing on RA RRR MIld truncal obesity and Rt CVAT  No foley No c/c/e  Lab Results:  Recent Labs    12/22/23 1214 12/23/23 0357  WBC 14.0* 12.5*  HGB 12.6* 10.7*  HCT 37.8* 34.7*  PLT 128* 117*   BMET Recent Labs    12/22/23 1214 12/23/23 0357  NA 135 139  K 4.3 4.4  CL 98 107  CO2 22 22  GLUCOSE 103* 91  BUN 38* 35*  CREATININE 2.95* 2.46*  CALCIUM 9.4 8.1*   PT/INR No results for input(s): LABPROT, INR in the last 72 hours. ABG No results for input(s): PHART, HCO3 in the last 72 hours.  Invalid input(s): PCO2, PO2  Studies/Results: CT ABDOMEN PELVIS WO CONTRAST Result Date: 12/22/2023 CLINICAL DATA:  Abdominal pain, fever, UTI/prostatitis. EXAM: CT ABDOMEN AND PELVIS WITHOUT CONTRAST TECHNIQUE: Multidetector CT imaging of the abdomen and pelvis was performed following the standard protocol without IV contrast. RADIATION DOSE REDUCTION: This exam was performed according to the departmental  dose-optimization program which includes automated exposure control, adjustment of the mA and/or kV according to patient size and/or use of iterative reconstruction technique. COMPARISON:  01/16/2019. FINDINGS: Lower chest: Basilar fibrotic interstitial lung disease, better evaluated on CT chest 05/22/2023. Heart is at the upper limits of normal in size. No pericardial or pleural effusion. Distal esophagus is grossly unremarkable. Hepatobiliary: Liver is grossly unremarkable. Cholecystectomy. No biliary ductal dilatation. Pancreas: Negative. Spleen: Negative. Adrenals/Urinary Tract: Adrenal glands are unremarkable. Tiny bilateral renal stones. There may be a subcentimeter low-attenuation lesion in the upper pole right kidney, too small to characterize. No specific follow-up necessary. Kidneys are otherwise unremarkable. Mild right hydronephrosis secondary to a 4 mm stone in the distal right ureter (2/71). Left ureter is decompressed. Bladder is relatively low in volume. Stomach/Bowel: Stomach, small bowel, appendix and colon are unremarkable. Vascular/Lymphatic: Atherosclerotic calcification of the aorta. No pathologically enlarged lymph nodes. Reproductive: Prostate is normal in size. Other: Small bilateral inguinal hernias contain fat. No free fluid. Mesenteries and peritoneum are unremarkable. Musculoskeletal: Degenerative changes in the spine. IMPRESSION: 1. Mild right hydronephrosis secondary to a 4 mm stone in the distal right ureter. 2. Bilateral renal stones. 3. Basilar fibrotic interstitial lung disease, better evaluated on CT chest 05/22/2023. 4.  Aortic atherosclerosis (ICD10-I70.0). Electronically Signed   By: Newell Eke M.D.   On: 12/22/2023 14:59    Anti-infectives: Anti-infectives (From admission, onward)    Start     Dose/Rate Route Frequency Ordered Stop   12/23/23  1500  cefTRIAXone  (ROCEPHIN ) 2 g in sodium chloride  0.9 % 100 mL IVPB        2 g 200 mL/hr over 30 Minutes Intravenous  Every 24 hours 12/22/23 2135     12/22/23 1445  cefTRIAXone  (ROCEPHIN ) 2 g in sodium chloride  0.9 % 100 mL IVPB        2 g 200 mL/hr over 30 Minutes Intravenous  Once 12/22/23 1434 12/22/23 1537       Assessment/Plan:  Proceed as planned with RIGHT ureteroscopy to stone free as long as no overt infectious parameters develop v. Stenting if so. Risks, benefits, alternatives, peri-op course discussed. Grearly appreciate hospitalist team comanagement.    Ricardo KATHEE Alvaro Mickey. 12/23/2023

## 2023-12-23 NOTE — Brief Op Note (Signed)
 12/22/2023 - 12/23/2023  6:34 PM  PATIENT:  William Howell  83 y.o. male  PRE-OPERATIVE DIAGNOSIS:  Right ureteral stone  POST-OPERATIVE DIAGNOSIS:  Right ureteral stone  PROCEDURE:  Procedure(s) with comments: CYSTOSCOPY/URETEROSCOPY/HOLMIUM LASER/STENT PLACEMENT (Right) - Possible stent placement  SURGEON:  Surgeons and Role:    * Manny, Ricardo KATHEE Raddle., MD - Primary  PHYSICIAN ASSISTANT:   ASSISTANTS: none   ANESTHESIA:   general  EBL:  0 mL   BLOOD ADMINISTERED:none  DRAINS: none   LOCAL MEDICATIONS USED:  NONE  SPECIMEN:  Source of Specimen:  Rt ureteral / renal stone fragments  DISPOSITION OF SPECIMEN:  [facility] for compositional analysis  COUNTS:  YES  TOURNIQUET:  * No tourniquets in log *  DICTATION: .Other Dictation: Dictation Number [id number]  PLAN OF CARE: Admit to inpatient   PATIENT DISPOSITION:  PACU - hemodynamically stable.   Delay start of Pharmacological VTE agent (>24hrs) due to surgical blood loss or risk of bleeding: yes

## 2023-12-23 NOTE — Progress Notes (Signed)
 PROGRESS NOTE    William Howell  FMW:[id number] DOB: February 09, 1941 DOA: 12/22/2023 PCP: Duanne Butler DASEN, MD   Brief Narrative: 83 year old with past medical history significant for asthma, ILD, OSA on CPAP, hypertension, diabetes type 2, CKD 3 AA presents with fever dysuria and hematuria. -Patient develop fever, generalized malaise and right-sided flank pain, was seen by PCP on 12/19/2023, was noted to have tender prostate and started on Septra  for suspected prostatitis.  He continued to feel poorly and having chills.  He presented to the clinic and was sent to the ED for concern of sepsis.  Evaluation at [facility] patient was afebrile, heart rate and systolic blood pressure (90.  Creatinine of 2.9, white blood cell 14, normal lactate.  CT demonstrated right-sided hydronephrosis due to a distal right ureteral stone.  Urology was consulted.   Assessment & Plan:   Principal Problem:   Right ureteral stone Active Problems:   Asthma, chronic   Sleep apnea   Diabetes mellitus without complication (HCC)   ILD (interstitial lung disease) (HCC)   UTI (urinary tract infection)   Hypertension   Acute renal failure superimposed on stage 3a chronic kidney disease (HCC)  1-Obstructing right ureteral stone, UTI - Patient presented with persistent dysuria, leukocytosis, soft of blood pressure. - UA with more than 50 white blood cell.  Positive hemoglobin. - He was started as an outpatient on Bactrim  to cover for presumed prostatitis. - CT abdomen and pelvis without contrast: Showed mild right hydronephrosis secondary to a 4 mm stone in the distal right ureter. - Urology consulted, plan for ureteroscopy for stone extraction today, now if patient develop any fever or any parameter of infection plan is for stent placement. - Continue IV fluids - Follow blood cultures and urine culture: No growth to date. - Continue IV ceftriaxone  - Continue Flomax   AKI superimposed on CKD 3A: - Presents with a  creatinine of 2.9 from 1.49 in July 2025 - Continue with IV fluids -Combination of obstructive uropathy, hypovolemia, hypotension. - Plan for ureteroscopy and neck stone extraction today. - Repeat labs in the morning  ILD, asthma, OSA - Stable, patient denies any worsening shortness of breath.  He is currently on room air. -We discussed about using CPAP tonight. -Continue Beztri. - Continue montelukast  -Holding nintedanib in the setting of AKI.  Might be able to resume at discharge.  Diabetes type 2: Continue to hold metformin  in the setting of AKI.  Might need to hold it at discharge  Hypertension: Blood pressure in the low side.  Monitor for now Does not seem that he was on blood pressure medication as an outpatient  Thrombocytopenia: In the setting of infection follow trend Anemia: Initial hemoglobin of 12, down to 10 suspect hemodilution.  Will check anemia panel in the morning  Estimated body mass index is 39.25 kg/m as calculated from the following:   Height as of this encounter: 5' 6 (1.676 m).   Weight as of this encounter: 110.3 kg.   DVT prophylaxis: SCDs for procedure today. Code Status: Full code Family Communication: Wife who was at bedside Disposition Plan:  Status is: Observation The patient will require care spanning > 2 midnights and should be moved to inpatient because: Management of AKI    Consultants:  Urology  Procedures:    Antimicrobials:    Subjective: He is alert and conversant.  He denies dyspnea.  He does report dysuria   Objective: Vitals:   12/22/23 2107 12/22/23 2118 12/23/23 0008 12/23/23 0430  BP: (!) 128/57  (!) 104/54 (!) 112/57  Pulse: (!) 57  (!) 57 (!) 55  Resp: 19  17 18   Temp: 97.8 F (36.6 C)  97.8 F (36.6 C) 97.6 F (36.4 C)  TempSrc: Oral  Oral Oral  SpO2: 97%  93% 96%  Weight:  110.3 kg    Height:  5' 6 (1.676 m)      Intake/Output Summary (Last 24 hours) at 12/23/2023 0748 Last data filed at 12/23/2023  0431 Gross per 24 hour  Intake 2982.66 ml  Output 300 ml  Net 2682.66 ml   Filed Weights   12/22/23 2118  Weight: 110.3 kg    Examination:  General exam: Appears calm and comfortable  Respiratory system: mild crackles bases. SABRA Respiratory effort normal. Cardiovascular system: S1 & S2 heard, RRR.  Gastrointestinal system: Abdomen is nondistended, soft and nontender. No organomegaly or masses felt. Normal bowel sounds heard. Central nervous system: Alert and oriented.  Extremities: Symmetric 5 x 5 power.    Data Reviewed: I have personally reviewed following labs and imaging studies  CBC: Recent Labs  Lab 12/22/23 1214 12/23/23 0357  WBC 14.0* 12.5*  NEUTROABS 12.3*  --   HGB 12.6* 10.7*  HCT 37.8* 34.7*  MCV 89.6 93.0  PLT 128* 117*   Basic Metabolic Panel: Recent Labs  Lab 12/22/23 1214 12/23/23 0357  NA 135 139  K 4.3 4.4  CL 98 107  CO2 22 22  GLUCOSE 103* 91  BUN 38* 35*  CREATININE 2.95* 2.46*  CALCIUM 9.4 8.1*   GFR: Estimated Creatinine Clearance: 26.5 mL/min (A) (by C-G formula based on SCr of 2.46 mg/dL (H)). Liver Function Tests: Recent Labs  Lab 12/22/23 1214  AST 32  ALT 31  ALKPHOS 125  BILITOT 0.4  PROT 6.8  ALBUMIN 3.7   No results for input(s): LIPASE, AMYLASE in the last 168 hours. No results for input(s): AMMONIA in the last 168 hours. Coagulation Profile: No results for input(s): INR, PROTIME in the last 168 hours. Cardiac Enzymes: No results for input(s): CKTOTAL, CKMB, CKMBINDEX, TROPONINI in the last 168 hours. BNP (last 3 results) No results for input(s): PROBNP in the last 8760 hours. HbA1C: No results for input(s): HGBA1C in the last 72 hours. CBG: Recent Labs  Lab 12/23/23 0009 12/23/23 0358 12/23/23 0738  GLUCAP 146* 83 86   Lipid Profile: No results for input(s): CHOL, HDL, LDLCALC, TRIG, CHOLHDL, LDLDIRECT in the last 72 hours. Thyroid Function Tests: No results for  input(s): TSH, T4TOTAL, FREET4, T3FREE, THYROIDAB in the last 72 hours. Anemia Panel: No results for input(s): VITAMINB12, FOLATE, FERRITIN, TIBC, IRON, RETICCTPCT in the last 72 hours. Sepsis Labs: Recent Labs  Lab 12/22/23 1214 12/22/23 1615  LATICACIDVEN 1.7 1.1    No results found for this or any previous visit (from the past 240 hours).       Radiology Studies: CT ABDOMEN PELVIS WO CONTRAST Result Date: 12/22/2023 CLINICAL DATA:  Abdominal pain, fever, UTI/prostatitis. EXAM: CT ABDOMEN AND PELVIS WITHOUT CONTRAST TECHNIQUE: Multidetector CT imaging of the abdomen and pelvis was performed following the standard protocol without IV contrast. RADIATION DOSE REDUCTION: This exam was performed according to the departmental dose-optimization program which includes automated exposure control, adjustment of the mA and/or kV according to patient size and/or use of iterative reconstruction technique. COMPARISON:  01/16/2019. FINDINGS: Lower chest: Basilar fibrotic interstitial lung disease, better evaluated on CT chest 05/22/2023. Heart is at the upper limits of normal in size. No pericardial  or pleural effusion. Distal esophagus is grossly unremarkable. Hepatobiliary: Liver is grossly unremarkable. Cholecystectomy. No biliary ductal dilatation. Pancreas: Negative. Spleen: Negative. Adrenals/Urinary Tract: Adrenal glands are unremarkable. Tiny bilateral renal stones. There may be a subcentimeter low-attenuation lesion in the upper pole right kidney, too small to characterize. No specific follow-up necessary. Kidneys are otherwise unremarkable. Mild right hydronephrosis secondary to a 4 mm stone in the distal right ureter (2/71). Left ureter is decompressed. Bladder is relatively low in volume. Stomach/Bowel: Stomach, small bowel, appendix and colon are unremarkable. Vascular/Lymphatic: Atherosclerotic calcification of the aorta. No pathologically enlarged lymph nodes. Reproductive:  Prostate is normal in size. Other: Small bilateral inguinal hernias contain fat. No free fluid. Mesenteries and peritoneum are unremarkable. Musculoskeletal: Degenerative changes in the spine. IMPRESSION: 1. Mild right hydronephrosis secondary to a 4 mm stone in the distal right ureter. 2. Bilateral renal stones. 3. Basilar fibrotic interstitial lung disease, better evaluated on CT chest 05/22/2023. 4.  Aortic atherosclerosis (ICD10-I70.0). Electronically Signed   By: Newell Eke M.D.   On: 12/22/2023 14:59        Scheduled Meds:  budesonide -glycopyrrolate -formoterol   2 puff Inhalation BID   insulin  aspart  0-6 Units Subcutaneous Q4H   montelukast   10 mg Oral QHS   pantoprazole   40 mg Oral Daily   sodium chloride  flush  3 mL Intravenous Q12H   tamsulosin   0.4 mg Oral Daily   Continuous Infusions:  sodium chloride  100 mL/hr at 12/23/23 9385   cefTRIAXone  (ROCEPHIN )  IV       LOS: 0 days    Time spent: 35 Minutes    Kaiyon Hynes A Dekari Bures, MD [facility]   If 7PM-7AM, please contact night-coverage [email or web address]  12/23/2023, 7:48 AM

## 2023-12-23 NOTE — Op Note (Unsigned)
 NAME: William Howell, William Howell MEDICAL RECORD NO: [id number] ACCOUNT NO: [id number] DATE OF BIRTH: 07-08-1940 FACILITY: [facility] LOCATION: [id number] PHYSICIAN: Ricardo Likens, MD  Operative Report   DATE OF PROCEDURE: 12/23/2023  PREOPERATIVE DIAGNOSIS:  Right ureteral and renal stones,for compositional analysis  PROCEDURE PERFORMED: 1.  Cystoscopy with right retrograde pyelogram with interpretation. 2.  Right ureteroscopy with laser lithotripsy. 3.  Insertion of right ureteral stent.   ESTIMATED BLOOD LOSS:  Nil.   COMPLICATIONS:  None.   SPECIMENS:  Right ureteral and renal stone fragments for composition analysis.  FINDINGS: 1.  Right mid ureteral stone with mild hydronephrosis. 2.  Right lower pole renal stone. 3.  Complete resolution of all accessible stone fragments larger than one third mm following laser lithotripsy and basket extraction. 4.  Successful placement of left ureteral stent, proximal end in renal pelvis, distal end in the urinary bladder with tether.  INDICATIONS:  The patient is a pleasant and quite vigorous 83 year old man who was found on workup of pelvic pain and occasional flank pain to have a right distal ureteral stone as well as right renal stones and some acute renal failure.  Given his  significant creatinine rise, it was felt that decompression was clearly warranted and given the relatively small stone volume, I recommended definitive management with ureteroscopy with goal of stone free status, and he wished to proceed.  Informed  consent was obtained and placed in the medical record.  DESCRIPTION OF PROCEDURE:  The patient being William Howell was identified and the procedure being right ureteroscopic stone manipulation was confirmed.  Procedure time out was performed.  Intravenous antibiotics administered.  General  anesthesia was induced.  The patient was placed into a low lithotomy position.  Sterile fields were created prepped and draped the  patient's penis, perineum and proximal thighs using iodine.  Cystourethroscopy was performed using a 21-French with rigid  cystoscope with lens.  Inspection of the anterior and posterior urethra was unremarkable.  There was minimal prostatic hypertrophy.  There was moderate bladder trabeculation.  Ureteral orifices appeared single.  The right ureteral orifice was cannulated  with a 6-French open-ended catheter and a right retrograde pyelogram was obtained.  The right retrograde pyelogram demonstrated single right ureter, single system right kidney.  There was a filling defect in the distal ureter consistent with the known stone.  A 0.038 zipwire was advanced at the level of the upper pole and set aside as a safety wire.  An 8-French feeding tube was placed in the urinary bladder for pressure release.  A semi-rigid ureteroscopy was performed in the distal right ureter alongside  the separate sensory working wire.  Just above the right intramural ureter, the stone in question was encountered. It was somewhat fusiform, but did appear to be too large for simple basketing.  Holmium laser was introduced and then applied to the stone  at a setting of 0.2 joules and 20 Hz.  The stone was fragmented into two smaller pieces that were also quite fusiform.  These were then amenable to grasping on the long access individually and removing and setting aside for composition analysis.  Repeat  semi-rigid ureteroscopy of the distal two-thirds of the right ureter revealed no significant mucosal abnormalities. The semi-rigid scope was exchanged for a medium-length ureteral access sheath to the level of the mid ureter using continuous fluoroscopic  guidance and a flexible digital ureteroscopy was performed of the proximal ureter and systematic inspection of the right kidney including all calyces x 3.  As anticipated, there was a dominant renal calcification as well in the lower mid calyx.  This  also was just too large for  simple basketing and similarly, it was fragmented into pieces using Holmium laser energy, which were then sequentially removed and set aside for composition analysis.  Following this, there was complete resolution of all  accessible stone fragments larger than one third mm, excellent hemostasis was noted.  No evidence of perforation.  The access sheath was removed under direct continuous vision.  There were no significant mucosal abnormalities were found.  Given access  sheath usage and his acute renal failure, it was felt that brief interval stenting with a tether stent would be most prudent and a new 5 x 26 Polaris type stent was carefully placed using fluoroscopic guidance.  Good proximal and distal planes were  noted.  The tether was left in place, fashioned to the dorsum of the penis.  Procedure was terminated.  The patient tolerated the procedure well.  No immediate peri procedure complications.  The patient was taken to postanesthesia care unit in stable  condition.  Plan for inpatient admission.   MUK D: 12/23/2023 6:38:58 pm T: 12/23/2023 10:32:00 pm  JOB: [id number]/ [id number]

## 2023-12-23 NOTE — Progress Notes (Signed)
 PT Cancellation Note  Patient Details Name: William Howell MRN: [id number] DOB: [birth year]   Cancelled Treatment:    Reason Eval/Treat Not Completed: Other (comment) Noted pt with ureteral stone with plan to OR after 1700 (already 1613) for stent.  Will f/u tomorrow after procedure.  Treniya Lobb, PT Acute Rehab [facility] [phone]   Benjiman VEAR Mulberry 12/23/2023, 4:13 PM

## 2023-12-23 NOTE — Progress Notes (Deleted)
   12/23/23 1154  TOC Brief Assessment  Insurance and Status Reviewed  Patient has primary care physician Yes  Home environment has been reviewed Resides in single family home with spouse  Prior level of function: Independent with ADLs at baseline  Prior/Current Home Services No current home services  Social Drivers of Health Review SDOH reviewed no interventions necessary  Readmission risk has been reviewed Yes  Transition of care needs no transition of care needs at this time

## 2023-12-23 NOTE — Progress Notes (Signed)
   12/23/23 2251  BiPAP/CPAP/SIPAP  $ Non-Invasive Home Ventilator  Initial  $ Face Mask Medium Yes  BiPAP/CPAP/SIPAP Pt Type Adult  BiPAP/CPAP/SIPAP Resmed  Mask Type Full face mask  Dentures removed? Not applicable  Mask Size Medium  Patient Home Machine No  Patient Home Mask No  Patient Home Tubing No  Auto Titrate Yes  Minimum cmH2O 5 cmH2O  Maximum cmH2O 20 cmH2O  CPAP/SIPAP surface wiped down Yes  Device Plugged into RED Power Outlet Yes  BiPAP/CPAP /SiPAP Vitals  Resp 14  MEWS Score/Color  MEWS Score 0  MEWS Score Color Green

## 2023-12-23 NOTE — Anesthesia Preprocedure Evaluation (Signed)
 Anesthesia Evaluation  Patient identified by MRN, date of birth, ID band Patient awake    Reviewed: Allergy  & Precautions, NPO status , Patient's Chart, lab work & pertinent test results, reviewed documented beta blocker date and time   History of Anesthesia Complications Negative for: history of anesthetic complications  Airway Mallampati: II  TM Distance: >3 FB     Dental  (+) Edentulous Upper, Edentulous Lower   Pulmonary shortness of breath, asthma , sleep apnea , neg COPD   breath sounds clear to auscultation       Cardiovascular hypertension, (-) angina (-) CAD and (-) Past MI + dysrhythmias  Rhythm:Regular Rate:Normal  IMPRESSIONS     1. Left ventricular ejection fraction, by estimation, is 55 to 60%. The  left ventricle has normal function. The left ventricle has no regional  wall motion abnormalities. Left ventricular diastolic parameters are  consistent with Grade I diastolic  dysfunction (impaired relaxation).   2. Right ventricular systolic function is normal. The right ventricular  size is normal.   3. The mitral valve is normal in structure. Mild mitral valve  regurgitation. No evidence of mitral stenosis.   4. The aortic valve has an indeterminant number of cusps. Aortic valve  regurgitation is mild. Aortic valve sclerosis/calcification is present,  without any evidence of aortic stenosis.   5. There is borderline dilatation of the aortic root, measuring 40 mm.   6. The inferior vena cava is normal in size with greater than 50%  respiratory variability, suggesting right atrial pressure of 3 mmHg.     Neuro/Psych neg Seizures    GI/Hepatic ,GERD  ,,  Endo/Other  diabetes    Renal/GU Renal disease     Musculoskeletal   Abdominal   Peds  Hematology   Anesthesia Other Findings   Reproductive/Obstetrics                              Anesthesia Physical Anesthesia  Plan  ASA: 2  Anesthesia Plan: General   Post-op Pain Management:    Induction: Intravenous  PONV Risk Score and Plan: 2 and Ondansetron   Airway Management Planned: LMA  Additional Equipment:   Intra-op Plan:   Post-operative Plan: Extubation in OR  Informed Consent: I have reviewed the patients History and Physical, chart, labs and discussed the procedure including the risks, benefits and alternatives for the proposed anesthesia with the patient or authorized representative who has indicated his/her understanding and acceptance.     Dental advisory given  Plan Discussed with: CRNA  Anesthesia Plan Comments:          Anesthesia Quick Evaluation

## 2023-12-23 NOTE — Consult Note (Addendum)
 Urology Consult Note   Requesting Attending Physician:  Madelyne Owen LABOR, MD Service Providing Consult: Urology  Consulting Attending: Dr. Alvaro   Reason for Consult:  ureteral stone  HPI: William Howell is seen in consultation for reasons noted above at the request of Regalado, Owen LABOR, MD. Patient is an 83 year old male initially presenting to [facility] Department with complaints of fever and increased frequency.  PMH significant for asthma, interstitial lung disease, T2DM, CKD 3a, and OSA on CPAP.  He reports an unintended 100 pound weight loss over the last year, though this coincided with starting Ofev  and reduced appetite.  He had been recently seen by his PCP and had been taking Bactrim  for presumed prostatitis.  During workup he was found to have acute kidney injury with a 4 mm distal right ureteral obstructing stone.  He was transferred to [facility] for surgical consideration.  On my arrival this morning patient was alert, oriented, and in no distress.  We reviewed the case and plan with images.  This is his first kidney stone.  All questions were answered to his satisfaction. ------------------  Assessment:   83 y.o. male with distal right ureteral stone   Recommendations: # Right ureteral stone # Right hydronephrosis # AKI  To the OR with Dr. Alvaro after 5 PM for stone extraction and ureteral stent. Remain n.p.o. AKI likely multifactorial. Obstructing stone.  He reports spending 10-12 hours a day on the tractor with minimal fluid intake and retaining urine. Also receiving metformin  and Bactrim . Continue IV fluids  Case and plan discussed with Dr. Alvaro  Past Medical History: Past Medical History:  Diagnosis Date   Allergy     Asthma    Diabetes mellitus type II, controlled (HCC)    GERD (gastroesophageal reflux disease)    pt states he takes omeprazole  but doesnt have GERD   Hypertension    Obesity    Sleep apnea    uses CPAP     Past Surgical History:  Past Surgical History:  Procedure Laterality Date   CHOLECYSTECTOMY     DENTAL SURGERY      Medication: Current Facility-Administered Medications  Medication Dose Route Frequency Provider Last Rate Last Admin   acetaminophen  (TYLENOL ) tablet 650 mg  650 mg Oral Q6H PRN Opyd, Timothy S, MD       Or   acetaminophen  (TYLENOL ) suppository 650 mg  650 mg Rectal Q6H PRN Opyd, Timothy S, MD       albuterol  (PROVENTIL ) (2.5 MG/3ML) 0.083% nebulizer solution 2.5 mg  2.5 mg Nebulization Q6H PRN Opyd, Timothy S, MD       budesonide -glycopyrrolate -formoterol  (BREZTRI ) 160-9-4.8 MCG/ACT inhaler 2 puff  2 puff Inhalation BID Opyd, Evalene RAMAN, MD   2 puff at 12/23/23 0843   cefTRIAXone  (ROCEPHIN ) 2 g in sodium chloride  0.9 % 100 mL IVPB  2 g Intravenous Q24H Opyd, Timothy S, MD       fentaNYL  (SUBLIMAZE ) injection 12.5-50 mcg  12.5-50 mcg Intravenous Q2H PRN Opyd, Timothy S, MD       insulin  aspart (novoLOG ) injection 0-6 Units  0-6 Units Subcutaneous Q4H Opyd, Timothy S, MD       lactated ringers  infusion   Intravenous Continuous Regalado, Belkys A, MD       montelukast  (SINGULAIR ) tablet 10 mg  10 mg Oral QHS Opyd, Timothy S, MD   10 mg at 12/22/23 2204   oxyCODONE  (Oxy IR/ROXICODONE ) immediate release tablet 5 mg  5 mg Oral Q4H PRN  Opyd, Timothy S, MD       pantoprazole  (PROTONIX ) EC tablet 40 mg  40 mg Oral Daily Opyd, Timothy S, MD       prochlorperazine  (COMPAZINE ) injection 5 mg  5 mg Intravenous Q6H PRN Opyd, Timothy S, MD       senna (SENOKOT) tablet 8.6 mg  1 tablet Oral Daily PRN Opyd, Evalene RAMAN, MD       sodium chloride  flush (NS) 0.9 % injection 3 mL  3 mL Intravenous Q12H Opyd, Evalene RAMAN, MD   3 mL at 12/22/23 2203   tamsulosin  (FLOMAX ) capsule 0.4 mg  0.4 mg Oral Daily Opyd, Evalene RAMAN, MD        Allergies: No Known Allergies  Social History: Social History   Tobacco Use   Smoking status: Never   Smokeless tobacco: Former    Types: 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     Objective   Vital signs in last 24 hours: BP (!) 121/52 (BP Location: Left Arm)   Pulse (!) 57   Temp 97.9 F (36.6 C) (Oral)   Resp 20   Ht 5' 6 (1.676 m)   Wt 110.3 kg   SpO2 95%   BMI 39.25 kg/m   Physical Exam General: A&O, resting, appropriate HEENT: [place]/AT Pulmonary: Normal work of breathing Cardiovascular: no cyanosis Abdomen: Soft, NTTP, nondistended Neuro: Appropriate, no focal neurological deficits  Most Recent Labs: Lab Results  Component Value Date   WBC 12.5 (H) 12/23/2023   HGB 10.7 (L) 12/23/2023   HCT 34.7 (L) 12/23/2023   PLT 117 (L) 12/23/2023    Lab Results  Component Value Date   NA 139 12/23/2023   K 4.4 12/23/2023   CL 107 12/23/2023   CO2 22 12/23/2023   BUN 35 (H) 12/23/2023   CREATININE 2.46 (H) 12/23/2023   CALCIUM 8.1 (L) 12/23/2023    No results found for: INR, APTT   Urine Culture: @[id number] (laburin,org,[id number])@   IMAGING: CT ABDOMEN PELVIS WO CONTRAST Result Date: 12/22/2023 CLINICAL DATA:  Abdominal pain, fever, UTI/prostatitis. EXAM: CT ABDOMEN AND PELVIS WITHOUT CONTRAST TECHNIQUE: Multidetector CT imaging of the abdomen and pelvis was performed following the standard protocol without IV contrast. RADIATION DOSE REDUCTION: This exam was performed according to the departmental dose-optimization program which includes automated exposure control, adjustment of  the mA and/or kV according to patient size and/or use of iterative reconstruction technique. COMPARISON:  01/16/2019. FINDINGS: Lower chest: Basilar fibrotic interstitial lung disease, better evaluated on CT chest 05/22/2023. Heart is at the upper limits of normal in size. No pericardial or pleural effusion. Distal esophagus is grossly unremarkable. Hepatobiliary: Liver is grossly unremarkable. Cholecystectomy. No biliary ductal dilatation. Pancreas: Negative. Spleen: Negative. Adrenals/Urinary Tract: Adrenal glands are unremarkable.  Tiny bilateral renal stones. There may be a subcentimeter low-attenuation lesion in the upper pole right kidney, too small to characterize. No specific follow-up necessary. Kidneys are otherwise unremarkable. Mild right hydronephrosis secondary to a 4 mm stone in the distal right ureter (2/71). Left ureter is decompressed. Bladder is relatively low in volume. Stomach/Bowel: Stomach, small bowel, appendix and colon are unremarkable. Vascular/Lymphatic: Atherosclerotic calcification of the aorta. No pathologically enlarged lymph nodes. Reproductive: Prostate is normal in size. Other: Small bilateral inguinal hernias contain fat. No free fluid. Mesenteries and peritoneum are unremarkable. Musculoskeletal: Degenerative changes in the spine. IMPRESSION: 1. Mild right hydronephrosis secondary to a 4 mm stone in the distal right ureter. 2. Bilateral renal stones. 3. Basilar fibrotic interstitial lung disease, better evaluated on CT chest 05/22/2023. 4.  Aortic atherosclerosis (ICD10-I70.0). Electronically Signed   By: Newell Eke M.D.   On: 12/22/2023 14:59    ------  Ole Bourdon, NP Pager: [phone]   Please contact the urology consult pager with any further questions/concerns.  I have seen and examined the patient and agree with Cameron's plan. Acute renal failure and colic form Rt ureteral + renal stones. Rec ureteroscopy to stone free. Risks,  benefits,  alternatives discussed with pt and wife.

## 2023-12-24 ENCOUNTER — Other Ambulatory Visit (HOSPITAL_COMMUNITY): Payer: Self-pay

## 2023-12-24 ENCOUNTER — Other Ambulatory Visit: Payer: Self-pay

## 2023-12-24 ENCOUNTER — Encounter (HOSPITAL_COMMUNITY): Payer: Self-pay | Admitting: Urology

## 2023-12-24 DIAGNOSIS — N201 Calculus of ureter: Secondary | ICD-10-CM | POA: Diagnosis not present

## 2023-12-24 LAB — BASIC METABOLIC PANEL WITH GFR
Anion gap: 11 (ref 5–15)
BUN: 27 mg/dL — ABNORMAL HIGH (ref 8–23)
CO2: 22 mmol/L (ref 22–32)
Calcium: 8.6 mg/dL — ABNORMAL LOW (ref 8.9–10.3)
Chloride: 106 mmol/L (ref 98–111)
Creatinine, Ser: 2.01 mg/dL — ABNORMAL HIGH (ref 0.61–1.24)
GFR, Estimated: 32 mL/min — ABNORMAL LOW (ref >60)
Glucose, Bld: 95 mg/dL (ref 70–99)
Potassium: 4.7 mmol/L (ref 3.5–5.1)
Sodium: 139 mmol/L (ref 135–145)

## 2023-12-24 LAB — CBC
HCT: 36.4 % — ABNORMAL LOW (ref 39.0–52.0)
Hemoglobin: 11.5 g/dL — ABNORMAL LOW (ref 13.0–17.0)
MCH: 29.7 pg (ref 26.0–34.0)
MCHC: 31.6 g/dL (ref 30.0–36.0)
MCV: 94.1 fL (ref 80.0–100.0)
Platelets: 150 K/uL (ref 150–400)
RBC: 3.87 MIL/uL — ABNORMAL LOW (ref 4.22–5.81)
RDW: 14.5 % (ref 11.5–15.5)
WBC: 10.3 K/uL (ref 4.0–10.5)
nRBC: 0 % (ref 0.0–0.2)

## 2023-12-24 LAB — GLUCOSE, CAPILLARY
Glucose-Capillary: 71 mg/dL (ref 70–99)
Glucose-Capillary: 90 mg/dL (ref 70–99)
Glucose-Capillary: 84 mg/dL (ref 70–99)
Glucose-Capillary: 117 mg/dL — ABNORMAL HIGH (ref 70–99)

## 2023-12-24 LAB — RETICULOCYTES
Immature Retic Fract: 14.9 % (ref 2.3–15.9)
RBC.: 3.87 MIL/uL — ABNORMAL LOW (ref 4.22–5.81)
Retic Count, Absolute: 24 K/uL (ref 19.0–186.0)
Retic Ct Pct: 0.6 % (ref 0.4–3.1)

## 2023-12-24 LAB — VITAMIN B12: Vitamin B-12: 231 pg/mL (ref 180–914)

## 2023-12-24 LAB — FERRITIN: Ferritin: 167 ng/mL (ref 24–336)

## 2023-12-24 LAB — IRON AND TIBC
Iron: 31 ug/dL — ABNORMAL LOW (ref 45–182)
Saturation Ratios: 11 % — ABNORMAL LOW (ref 17.9–39.5)
TIBC: 269 ug/dL (ref 250–450)
UIBC: 238 ug/dL

## 2023-12-24 LAB — FOLATE: Folate: 7.2 ng/mL (ref >5.9)

## 2023-12-24 MED ORDER — CYANOCOBALAMIN 500 MCG PO TABS
500.0000 ug | ORAL_TABLET | Freq: Every day | ORAL | 4 refills | Status: AC
  Filled 2023-12-24: qty 100, 100d supply, fill #0

## 2023-12-24 MED ORDER — SENNOSIDES-DOCUSATE SODIUM 8.6-50 MG PO TABS
2.0000 | ORAL_TABLET | Freq: Every day | ORAL | 4 refills | Status: DC
  Filled 2023-12-24: qty 60, 30d supply, fill #0

## 2023-12-24 MED ORDER — VITAMIN B-12 100 MCG PO TABS
500.0000 ug | ORAL_TABLET | Freq: Every day | ORAL | Status: DC
  Administered 2023-12-24: 500 ug via ORAL
  Filled 2023-12-24: qty 5

## 2023-12-24 MED ORDER — FERROUS SULFATE 325 (65 FE) MG PO TABS
325.0000 mg | ORAL_TABLET | ORAL | 3 refills | Status: AC
  Filled 2023-12-24: qty 45, 79d supply, fill #0

## 2023-12-24 MED ORDER — ACETAMINOPHEN 325 MG PO TABS: 650.0000 mg | ORAL_TABLET | Freq: Four times a day (QID) | ORAL | Status: AC | PRN

## 2023-12-24 MED ORDER — TAMSULOSIN HCL 0.4 MG PO CAPS
0.4000 mg | ORAL_CAPSULE | Freq: Every day | ORAL | 3 refills | Status: AC
  Filled 2023-12-24: qty 90, 90d supply, fill #0

## 2023-12-24 MED ORDER — CEPHALEXIN 500 MG PO CAPS
500.0000 mg | ORAL_CAPSULE | Freq: Three times a day (TID) | ORAL | 0 refills | Status: AC
  Filled 2023-12-24: qty 15, 5d supply, fill #0

## 2023-12-24 NOTE — Discharge Summary (Signed)
 William Howell, is a 83 y.o. male  DOB 1940/08/03  MRN [id number].  Admission date:  12/22/2023  Admitting Physician  Owen DELENA Lore, MD  Discharge Date:  12/24/2023   Primary MD  Duanne Butler DASEN, MD  Recommendations for primary care physician for things to follow:  1)Please follow up with Dr. Ricardo Likens--- your urologist as previously advised by the urology team 2)Avoid ibuprofen/Advil/Aleve/Motrin/Goody Powders/Naproxen/BC powders/Meloxicam/Diclofenac/Indomethacin and other Nonsteroidal anti-inflammatory medications as these will make you more likely to bleed and can cause stomach ulcers, can also cause Kidney problems.  3) please note that been a few changes to medications  Admission Diagnosis  Kidney stone [N20.0] Ureteral stone [N20.1] Pyelonephritis [N12] Acute kidney injury (HCC) [N17.9]   Discharge Diagnosis  Kidney stone [N20.0] Ureteral stone [N20.1] Pyelonephritis [N12] Acute kidney injury (HCC) [N17.9]    Principal Problem:   Right ureteral stone Active Problems:   Asthma, chronic   Sleep apnea   Diabetes mellitus without complication (HCC)   ILD (interstitial lung disease) (HCC)   UTI (urinary tract infection)   Hypertension   Acute renal failure superimposed on stage 3a chronic kidney disease (HCC)   Kidney stone      Past Medical History:  Diagnosis Date   Allergy     Asthma    Diabetes mellitus type II, controlled (HCC)    GERD (gastroesophageal reflux disease)    pt states he takes omeprazole  but doesnt have GERD   Hypertension    Obesity    Sleep apnea    uses CPAP    Past Surgical History:  Procedure Laterality Date   CHOLECYSTECTOMY     CYSTOSCOPY/URETEROSCOPY/HOLMIUM LASER/STENT PLACEMENT Right 12/23/2023   Procedure: CYSTOSCOPY/URETEROSCOPY/HOLMIUM LASER/STENT PLACEMENT;  Surgeon: Likens Ricardo KATHEE Mickey., MD;  Location: [facility]S;  Service: Urology;   Laterality: Right;  Possible stent placement   DENTAL SURGERY         HPI  from the history and physical done on the day of admission:   Chief Complaint: Fever, dysuria, hematuria    HPI: William Howell is an 83 y.o. male with medical history significant for asthma, ILD, OSA on CPAP, HTN, T2DM, and CKD 3A who presents with fevers, dysuria, and hematuria.    He developed fevers, general malaise, and pain in the right flank and groin, was seen by his PCP on 12/19/23, noted to have tender prostate and was started on Septra  for suspected prostatitis. He has continue to feel poorly with shaking chills, was seen again for this today in an outpatient clinic, and was sent to the ED due to concern for sepsis.     [facility] ED Course: Upon arrival to the ED, patient is found to be afebrile and saturating well on room air with normal HR and SBP upper 90s and higher. Labs are most notable for SCr 2.95, WBC 14,000, platelets 128,000, and normal lactate. CT demonstrates mild right hydronephrosis due to a distal right ureteral stone.    Urology (Dr. Lovie) was consulted by the ED PA, blood  and urine cultures were collected, 2 liter NS and 2 g IV Rocephin  were administered, and the patient was transferred to [facility] for ongoing management.    Review of Systems:  All other systems reviewed and apart from HPI, are negative.     Hospital Course:    A/p 1-Obstructing right ureteral stone, UTI - Patient presented with persistent dysuria, leukocytosis, soft of blood pressure. - UA with more than 50 white blood cell.  Positive hemoglobin. - He was started as an outpatient on Bactrim  to cover for presumed prostatitis. - CT abdomen and pelvis without contrast: Showed mild right hydronephrosis secondary to a 4 mm stone in the distal right ureter. - Urology consulted,  S/p Cystoscopy with right retrograde pyelogram with interpretation. And   Right ureteroscopy with laser lithotripsy  and  Insertion of right ureteral stent on 09/209/25 -- Patient was treated with IV Rocephin  -Urine and blood cultures from 12/22/2023 NGTD -Continue Flomax  -Discharge on Keflex  for now pending final cultures -Discharge home with outpatient follow-up with urologist Dr. Alvaro    AKI superimposed on CKD 3A: - Presents with a creatinine of 2.95 from 1.49 in July 2025 -Combination of obstructive uropathy, hypovolemia, hypotension. - Continues down to 2.0 from 2.95 after hydration and relief of obstructive uropathy --Anticipate that creatinine and overall renal function continue to improve now that obstructive uropathy has been relieved by urological procedure   ILD, asthma, OSA - Stable, patient denies any worsening shortness of breath.  He is currently on room air. --Continue Beztri. - Continue montelukast  -resume nintedanib    Diabetes type 2: -Follow-up with PCP for medication adjustments -May have to hold metformin  outpatient if renal function does not continue to improve as anticipated   Hypertension: -BP improved restart PTA BP meds   Thrombocytopenia: In the setting of infection--platelets have now normalized  Acute anemia: Initial hemoglobin of 12, down to 10 suspect hemodilution.  -Repeat CBC as outpatient advised    Class 2  Obesity- -Low calorie diet, portion control and increase physical activity discussed with patient -Body mass index is 39.25 kg/m.     Discharge Condition: stable  Follow UP   Follow-up Information     Manny, Ricardo KATHEE Raddle., MD. Schedule an appointment as soon as possible for a visit.   Specialty: Urology Contact information: [address] [place] [address] [phone]                  Consults obtained -urology  Diet and Activity recommendation:  As advised  Discharge Instructions    Discharge Instructions     Call MD for:  difficulty breathing, headache or visual disturbances   Complete by: As directed    Call MD for:   persistant dizziness or light-headedness   Complete by: As directed    Call MD for:  persistant nausea and vomiting   Complete by: As directed    Call MD for:  temperature >100.4   Complete by: As directed    Diet - low sodium heart healthy   Complete by: As directed    Discharge instructions   Complete by: As directed    1)Please follow up with Dr. Ricardo Alvaro--- your urologist as previously advised by the urology team 2)Avoid ibuprofen/Advil/Aleve/Motrin/Goody Powders/Naproxen/BC powders/Meloxicam/Diclofenac/Indomethacin and other Nonsteroidal anti-inflammatory medications as these will make you more likely to bleed and can cause stomach ulcers, can also cause Kidney problems.  3) please note that been a few changes to medications   Increase  activity slowly   Complete by: As directed    No wound care   Complete by: As directed         Discharge Medications     Allergies as of 12/24/2023   No Known Allergies      Medication List     STOP taking these medications    sulfamethoxazole -trimethoprim  800-160 MG tablet Commonly known as: BACTRIM  DS       TAKE these medications    acetaminophen  325 MG tablet Commonly known as: TYLENOL  Take 2 tablets (650 mg total) by mouth every 6 (six) hours as needed for mild pain (pain score 1-3) or fever (or Fever >/= 101).   Breztri  Aerosphere 160-9-4.8 MCG/ACT Aero inhaler Generic drug: budesonide -glycopyrrolate -formoterol  Inhale 2 puffs into the lungs 2 (two) times daily.   cephALEXin  500 MG capsule Commonly known as: KEFLEX  Take 1 capsule (500 mg total) by mouth 3 (three) times daily for 5 days.   cetirizine  10 MG tablet Commonly known as: ZYRTEC  Take 1 tablet by mouth once daily   cyanocobalamin  500 MCG tablet Commonly known as: VITAMIN B12 Take 1 tablet (500 mcg total) by mouth daily.   Fasenra  Pen 30 MG/ML prefilled autoinjector Generic drug: benralizumab  Inject 30mg  into the skin at Week 8 and every 8  weeks What changed:  how much to take how to take this when to take this additional instructions   ferrous sulfate  325 (65 FE) MG tablet Take 1 tablet (325 mg total) by mouth every Tuesday, Thursday, Saturday, and Sunday. Start taking on: December 25, 2023   losartan  50 MG tablet Commonly known as: COZAAR  Take 1 tablet by mouth once daily   metFORMIN  500 MG tablet Commonly known as: GLUCOPHAGE  TAKE 1 TABLET BY MOUTH TWICE DAILY WITH A MEAL   Ofev  100 MG Caps Generic drug: Nintedanib Take 1 capsule (100 mg total) by mouth 2 (two) times daily. What changed:  how much to take when to take this   omeprazole  40 MG capsule Commonly known as: PRILOSEC Take 1 capsule by mouth once daily   senna-docusate 8.6-50 MG tablet Commonly known as: Senokot-S Take 2 tablets by mouth at bedtime.   tamsulosin  0.4 MG Caps capsule Commonly known as: FLOMAX  Take 1 capsule (0.4 mg total) by mouth daily.   Ventolin  HFA 108 (90 Base) MCG/ACT inhaler Generic drug: albuterol  Inhale 2 puffs into the lungs every 6 (six) hours as needed for wheezing or shortness of breath.   zafirlukast  20 MG tablet Commonly known as: ACCOLATE  Take 1 tablet (20 mg total) by mouth 2 (two) times daily. What changed: when to take this        Major procedures and Radiology Reports - PLEASE review detailed and final reports for all details, in brief -  DG C-Arm 1-60 Min-No Report Result Date: 12/23/2023 Fluoroscopy was utilized by the requesting physician.  No radiographic interpretation.   CT ABDOMEN PELVIS WO CONTRAST Result Date: 12/22/2023 CLINICAL DATA:  Abdominal pain, fever, UTI/prostatitis. EXAM: CT ABDOMEN AND PELVIS WITHOUT CONTRAST TECHNIQUE: Multidetector CT imaging of the abdomen and pelvis was performed following the standard protocol without IV contrast. RADIATION DOSE REDUCTION: This exam was performed according to the departmental dose-optimization program which includes automated exposure  control, adjustment of the mA and/or kV according to patient size and/or use of iterative reconstruction technique. COMPARISON:  01/16/2019. FINDINGS: Lower chest: Basilar fibrotic interstitial lung disease, better evaluated on CT chest 05/22/2023. Heart is at the upper limits of normal in size. No  pericardial or pleural effusion. Distal esophagus is grossly unremarkable. Hepatobiliary: Liver is grossly unremarkable. Cholecystectomy. No biliary ductal dilatation. Pancreas: Negative. Spleen: Negative. Adrenals/Urinary Tract: Adrenal glands are unremarkable. Tiny bilateral renal stones. There may be a subcentimeter low-attenuation lesion in the upper pole right kidney, too small to characterize. No specific follow-up necessary. Kidneys are otherwise unremarkable. Mild right hydronephrosis secondary to a 4 mm stone in the distal right ureter (2/71). Left ureter is decompressed. Bladder is relatively low in volume. Stomach/Bowel: Stomach, small bowel, appendix and colon are unremarkable. Vascular/Lymphatic: Atherosclerotic calcification of the aorta. No pathologically enlarged lymph nodes. Reproductive: Prostate is normal in size. Other: Small bilateral inguinal hernias contain fat. No free fluid. Mesenteries and peritoneum are unremarkable. Musculoskeletal: Degenerative changes in the spine. IMPRESSION: 1. Mild right hydronephrosis secondary to a 4 mm stone in the distal right ureter. 2. Bilateral renal stones. 3. Basilar fibrotic interstitial lung disease, better evaluated on CT chest 05/22/2023. 4.  Aortic atherosclerosis (ICD10-I70.0). Electronically Signed   By: Newell Eke M.D.   On: 12/22/2023 14:59    Micro Results  Recent Results (from the past 240 hours)  Blood culture (routine x 2)     Status: None (Preliminary result)   Collection Time: 12/22/23  2:00 PM   Specimen: BLOOD  Result Value Ref Range Status   Specimen Description   Final    BLOOD LEFT ANTECUBITAL Performed at [facility], [address] [place], [place] [address]    Special Requests   Final    BOTTLES DRAWN AEROBIC AND ANAEROBIC Blood Culture adequate volume Performed at [facility], [address], [place], [place] [address]    Culture   Final    NO GROWTH 2 DAYS Performed at [facility], [address] [place], [place] [address]    Report Status PENDING  Incomplete  Blood culture (routine x 2)     Status: None (Preliminary result)   Collection Time: 12/22/23  2:04 PM   Specimen: BLOOD  Result Value Ref Range Status   Specimen Description   Final    BLOOD RIGHT ANTECUBITAL Performed at [facility], [address], [place], [place] [address]    Special Requests   Final    BOTTLES DRAWN AEROBIC AND ANAEROBIC Blood Culture adequate volume Performed at [facility], [address] [place], [place] [address]    Culture   Final    NO GROWTH 2 DAYS Performed at [facility], [address] [place], [place] [address]    Report Status PENDING  Incomplete  Urine Culture     Status: None   Collection Time: 12/22/23  3:23 PM   Specimen: Urine, Clean Catch  Result Value Ref Range Status   Specimen Description   Final    URINE, CLEAN CATCH Performed at [facility], [address], [place], [place] [address]    Special Requests   Final    NONE Performed at [facility], [address], [place], [place] [address]    Culture   Final    NO GROWTH Performed at [facility], [address], [place], [place] [address]    Report Status 12/23/2023 FINAL  Final    Today   Subjective    Abhiraj Dozal today has no new complaints No fever  Or chills   No Nausea, Vomiting or Diarrhea Voiding well - No dysuria or CVA tenderness  Patient has been seen and examined prior to discharge   Objective   Blood pressure 134/78, pulse (!) 53, temperature 98.1 F  (36.7 C), temperature source Oral, resp. rate 16, height 5' 6 (1.676 m), weight 110.3 kg, SpO2 95%.   Intake/Output Summary (Last 24 hours) at 12/24/2023 1233 Last data filed at 12/24/2023 1000 Gross per 24 hour  Intake 1048.77 ml  Output 700 ml  Net 348.77 ml    Exam Gen:- Awake Alert, obese, no acute distress  HEENT:- [place].AT, No sclera icterus Neck-Supple Neck,No JVD,.  Lungs-  CTAB , good air movement bilaterally CV- S1, S2 normal, regular Abd-  +ve B.Sounds, Abd Soft, No tenderness, increased truncal adiposity, no CVA tenderness Extremity/Skin:- No  edema,   good pulses,  Psych-affect is appropriate, oriented x3 Neuro-no new focal deficits, no tremors    Data Review   CBC w Diff:  Lab Results  Component Value Date   WBC 10.3 12/24/2023   HGB 11.5 (L) 12/24/2023   HCT 36.4 (L) 12/24/2023   PLT 150 12/24/2023   LYMPHOPCT 6 12/22/2023   MONOPCT 6 12/22/2023   EOSPCT 0 12/22/2023   BASOPCT 0 12/22/2023    CMP:  Lab Results  Component Value Date   NA 139 12/24/2023   NA 144 09/26/2021   K 4.7 12/24/2023   CL 106 12/24/2023   CO2 22 12/24/2023   BUN 27 (H) 12/24/2023   BUN 25 09/26/2021   CREATININE 2.01 (H) 12/24/2023   CREATININE 1.49 (H) 10/30/2023   PROT 6.8 12/22/2023   PROT 6.8 07/19/2019   ALBUMIN 3.7 12/22/2023   ALBUMIN 4.3 07/19/2019   BILITOT 0.4 12/22/2023   BILITOT 0.4 07/19/2019   ALKPHOS 125 12/22/2023   AST 32 12/22/2023   ALT 31 12/22/2023  .  Total Discharge time is about 33 minutes  Rendall Carwin M.D on 12/24/2023 at 12:33 PM  Go to [email or web address] -  for contact info  [facility] - Office  [phone]

## 2023-12-24 NOTE — Progress Notes (Addendum)
 Patient has been discharged. Rocephin  is scheduled, provider request medication be administered prior to discharge completion.  Discharge instructions reviewed with the patient and spouse. Questions,concerns were denied. Strings are intact. Pt encouraged to contact Urology provider to schedule follow up visit. Pt aware of medications in the out patient pharmacy, pt awaiting delivery.

## 2023-12-24 NOTE — Progress Notes (Signed)
 1 Day Post-Op Subjective: No acute events overnight.  Patient alert and oriented sitting on side of bed.  He was accompanied by his wife.  Case and plan was reviewed with them both and all questions were answered to their satisfaction.  Objective: Vital signs in last 24 hours: Temp:  [97 F (36.1 C)-98.7 F (37.1 C)] 97.8 F (36.6 C) (09/10 1031) Pulse Rate:  [54-78] 54 (09/10 1031) Resp:  [12-20] 16 (09/10 1031) BP: (115-171)/(47-77) 142/56 (09/10 1031) SpO2:  [95 %-100 %] 97 % (09/10 1031)  Assessment/Plan: # Right ureteral stone # Right hydronephrosis # AKI   To the OR with Dr. Alvaro after 5 PM for stone extraction and stent.  This will be removed on Friday.  Provided instruction for patient to remove, versus scheduling in clinic.  Interval improvement in AKI overnight d/t obstructing stone and medications.  Encourage water intake.  Urine still relatively concentrated.  Patient complaining of severe burning when urinating, though better this morning.  I would provide him with Pyridium but this is contraindicated with his renal function.  Okay to discharge from urologic perspective.  Intake/Output from previous day: 09/09 0701 - 09/10 0700 In: 808.8 [I.V.:808.8] Out: 550 [Urine:550]  Intake/Output this shift: Total I/O In: 240 [P.O.:240] Out: 150 [Urine:150]  Physical Exam:  General: Alert and oriented CV: No cyanosis Lungs: equal chest rise Abdomen: Soft, NTND, no rebound or guarding Gu: Clear dark yellow urine  Lab Results: Recent Labs    12/22/23 1214 12/23/23 0357 12/24/23 0401  HGB 12.6* 10.7* 11.5*  HCT 37.8* 34.7* 36.4*   BMET Recent Labs    12/23/23 0357 12/24/23 0401  NA 139 139  K 4.4 4.7  CL 107 106  CO2 22 22  GLUCOSE 91 95  BUN 35* 27*  CREATININE 2.46* 2.01*  CALCIUM 8.1* 8.6*  HGB 10.7* 11.5*  WBC 12.5* 10.3     Studies/Results: DG C-Arm 1-60 Min-No Report Result Date: 12/23/2023 Fluoroscopy was utilized by the requesting  physician.  No radiographic interpretation.   CT ABDOMEN PELVIS WO CONTRAST Result Date: 12/22/2023 CLINICAL DATA:  Abdominal pain, fever, UTI/prostatitis. EXAM: CT ABDOMEN AND PELVIS WITHOUT CONTRAST TECHNIQUE: Multidetector CT imaging of the abdomen and pelvis was performed following the standard protocol without IV contrast. RADIATION DOSE REDUCTION: This exam was performed according to the departmental dose-optimization program which includes automated exposure control, adjustment of the mA and/or kV according to patient size and/or use of iterative reconstruction technique. COMPARISON:  01/16/2019. FINDINGS: Lower chest: Basilar fibrotic interstitial lung disease, better evaluated on CT chest 05/22/2023. Heart is at the upper limits of normal in size. No pericardial or pleural effusion. Distal esophagus is grossly unremarkable. Hepatobiliary: Liver is grossly unremarkable. Cholecystectomy. No biliary ductal dilatation. Pancreas: Negative. Spleen: Negative. Adrenals/Urinary Tract: Adrenal glands are unremarkable. Tiny bilateral renal stones. There may be a subcentimeter low-attenuation lesion in the upper pole right kidney, too small to characterize. No specific follow-up necessary. Kidneys are otherwise unremarkable. Mild right hydronephrosis secondary to a 4 mm stone in the distal right ureter (2/71). Left ureter is decompressed. Bladder is relatively low in volume. Stomach/Bowel: Stomach, small bowel, appendix and colon are unremarkable. Vascular/Lymphatic: Atherosclerotic calcification of the aorta. No pathologically enlarged lymph nodes. Reproductive: Prostate is normal in size. Other: Small bilateral inguinal hernias contain fat. No free fluid. Mesenteries and peritoneum are unremarkable. Musculoskeletal: Degenerative changes in the spine. IMPRESSION: 1. Mild right hydronephrosis secondary to a 4 mm stone in the distal right ureter. 2. Bilateral  renal stones. 3. Basilar fibrotic interstitial lung  disease, better evaluated on CT chest 05/22/2023. 4.  Aortic atherosclerosis (ICD10-I70.0). Electronically Signed   By: Newell Eke M.D.   On: 12/22/2023 14:59      LOS: 1 day   Ole Bourdon, NP [facility] Pager: [phone]  12/24/2023, 10:48 AM

## 2023-12-24 NOTE — Plan of Care (Signed)

## 2023-12-24 NOTE — Discharge Instructions (Signed)
 1)Please follow up with Dr. Ricardo Likens--- your urologist as previously advised by the urology team 2)Avoid ibuprofen/Advil/Aleve/Motrin/Goody Powders/Naproxen/BC powders/Meloxicam/Diclofenac/Indomethacin and other Nonsteroidal anti-inflammatory medications as these will make you more likely to bleed and can cause stomach ulcers, can also cause Kidney problems.  3) please note that been a few changes to medications

## 2023-12-24 NOTE — Progress Notes (Signed)
 PT Cancellation Note  Patient Details Name: William Howell MRN: [id number] DOB: 1940-07-09   Cancelled Treatment:    Reason Eval/Treat Not Completed: Other (comment). Per OT, pt independent with ambulation and mobility and no PT needs identified. Will sign off at this time, please re-consult if needs arise.   Tori Shajuana Mclucas PT, DPT 12/24/23, 9:35 AM

## 2023-12-24 NOTE — Progress Notes (Signed)
 Discharge meds in a secure bag delivered to pt in room by this RN. Pt and wife verbalized that B12 was an OTC med and it was too soon to fill the flomax , Cash payment of $2.04 received from patient's wife to [employer] by this RN.

## 2023-12-24 NOTE — Evaluation (Signed)
 Occupational Therapy Evaluation and Discharge Patient Details Name: William Howell MRN: [id number] DOB: 1940-05-06 Today's Date: 12/24/2023   History of Present Illness   UDAY JANTZ is an 83 y.o. male who presented with fevers, dysuria, and hematuria. Found to have obstructing right ureteral stone and UTI. 9/9 lithotripsy and right ureteral stent.PHMx: asthma, ILD, OSA on CPAP, HTN, T2DM, obesity, and CKD 3A.     Clinical Impressions This 83 yo male admitted with above presents to acute OT at in independent level for basic ADLs and mobility. No further OT needs and I have made PT aware through secure chat that no PT needs were identified.       Functional Status Assessment   Patient has not had a recent decline in their functional status       Precautions/Restrictions   Precautions Precautions: None Restrictions Weight Bearing Restrictions Per Provider Order: No     Mobility Bed Mobility Overal bed mobility: Independent                  Transfers Overall transfer level: Independent                        Balance Overall balance assessment: Independent                                         ADL either performed or assessed with clinical judgement   ADL Overall ADL's : Independent                                             Vision Patient Visual Report: No change from baseline              Pertinent Vitals/Pain Pain Assessment Pain Assessment:  (pain with urination)     Extremity/Trunk Assessment Upper Extremity Assessment Upper Extremity Assessment: Overall WFL for tasks assessed           Communication Communication Communication: No apparent difficulties   Cognition Arousal: Alert Behavior During Therapy: WFL for tasks assessed/performed Cognition: No apparent impairments                               Following commands: Intact                  Home  Living Family/patient expects to be discharged to:: Private residence Living Arrangements: Spouse/significant other Available Help at Discharge: Family;Available 24 hours/day Type of Home: House Home Access: Stairs to enter [employer] of Steps: 3 Entrance Stairs-Rails: Right Home Layout: One level         Bathroom Toilet: Handicapped height     Home Equipment: None   Additional Comments: He is a Visual merchandiser, on tractors almost daily, stays active      Prior Functioning/Environment Prior Level of Function : Independent/Modified Independent;Driving;Working/employed                    OT Problem List: Pain        OT Goals(Current goals can be found in the care plan section)   Acute Rehab OT Goals Patient Stated Goal: for ability to control urine to get better  AM-PAC OT 6 Clicks Daily Activity     Outcome Measure Help from another person eating meals?: None Help from another person taking care of personal grooming?: None Help from another person toileting, which includes using toliet, bedpan, or urinal?: None Help from another person bathing (including washing, rinsing, drying)?: None Help from another person to put on and taking off regular upper body clothing?: None Help from another person to put on and taking off regular lower body clothing?: None 6 Click Score: 24   End of Session    Activity Tolerance: Patient tolerated treatment well Patient left:  (sitting EOB)  OT Visit Diagnosis: Pain Pain - part of body:  (with urination)                Time: 9167-9152 OT Time Calculation (min): 15 min Charges:  OT General Charges $OT Visit: 1 Visit OT Evaluation $OT Eval Low Complexity: 1 Low Donny BECKER OT Acute Rehabilitation Services Office [phone]    [place] Dorothyann Distel 12/24/2023, 8:58 AM

## 2023-12-24 NOTE — Progress Notes (Signed)
   12/23/23 1154  TOC Brief Assessment  Insurance and Status Reviewed  Patient has primary care physician Yes  Home environment has been reviewed Resides in single family home with spouse  Prior level of function: Independent with ADLs at baseline  Prior/Current Home Services No current home services  Social Drivers of Health Review SDOH reviewed no interventions necessary  Readmission risk has been reviewed Yes  Transition of care needs no transition of care needs at this time

## 2023-12-25 ENCOUNTER — Telehealth: Payer: Self-pay

## 2023-12-25 NOTE — Transitions of Care (Post Inpatient/ED Visit) (Signed)
 12/25/2023  Name: William Howell MRN: [id number] DOB: 25-Jun-1940  Today's TOC FU Call Status: Today's TOC FU Call Status:: Successful TOC FU Call Completed TOC FU Call Complete Date: 12/25/23 Patient's Name and Date of Birth confirmed.  Transition Care Management Follow-up Telephone Call Date of Discharge: 12/24/23 Discharge Facility: [facility]) Type of Discharge: Inpatient Admission Primary Inpatient Discharge Diagnosis:: Right ureteral stone How have you been since you were released from the hospital?: Better (Reports feeling much better) Any questions or concerns?: No  Items Reviewed: Did you receive and understand the discharge instructions provided?: Yes Medications obtained,verified, and reconciled?: Yes (Medications Reviewed) Any new allergies since your discharge?: No Dietary orders reviewed?: Yes Type of Diet Ordered:: low salt, heart healthy diet Do you have support at home?: Yes People in Home [RPT]: spouse Name of Support/Comfort Primary Source: [place]  Medications Reviewed Today: Medications Reviewed Today     Reviewed by Rumalda Alan PENNER, RN (Registered Nurse) on 12/25/23 at 1044  Med List Status: <None>   Medication Order Taking? Sig Documenting Provider Last Dose Status Informant  acetaminophen  (TYLENOL ) 325 MG tablet [id number] Yes Take 2 tablets (650 mg total) by mouth every 6 (six) hours as needed for mild pain (pain score 1-3) or fever (or Fever >/= 101). Pearlean Manus, MD  Active   benralizumab  (FASENRA  PEN) 30 MG/ML prefilled autoinjector [id number] Yes Inject 30mg  into the skin at Week 8 and every 8 weeks Geronimo Amel, MD  Active Spouse/Significant Other, Self, Pharmacy Records  Budeson-Glycopyrrol-Formoterol  (BREZTRI  AEROSPHERE) 160-9-4.8 MCG/ACT AERO [id number] Yes Inhale 2 puffs into the lungs 2 (two) times daily. Duanne Butler DASEN, MD  Active Spouse/Significant Other, Self, Pharmacy Records  cephALEXin  (KEFLEX ) 500 MG capsule  [id number] Yes Take 1 capsule (500 mg total) by mouth 3 (three) times daily for 5 days. Pearlean Manus, MD  Active   cetirizine  (ZYRTEC ) 10 MG tablet [id number] Yes Take 1 tablet by mouth once daily Duanne Butler DASEN, MD  Active Spouse/Significant Other, Self, Pharmacy Records  cyanocobalamin  (VITAMIN B12) 500 MCG tablet [id number] Yes Take 1 tablet (500 mcg total) by mouth daily. Pearlean Manus, MD  Active   ferrous sulfate  325 (65 FE) MG tablet [id number] Yes Take 1 tablet (325 mg total) by mouth every Tuesday, Thursday, Saturday, and Sunday. Pearlean Manus, MD  Active   losartan  (COZAAR ) 50 MG tablet [id number] Yes Take 1 tablet by mouth once daily Duanne Butler DASEN, MD  Active Spouse/Significant Other, Self, Pharmacy Records           Med Note [facility], COURAGE   Wed Dec 24, 2023 12:28 PM)    metFORMIN  (GLUCOPHAGE ) 500 MG tablet [id number] Yes TAKE 1 TABLET BY MOUTH TWICE DAILY WITH A MEAL Duanne Butler DASEN, MD  Active Spouse/Significant Other, Self, Pharmacy Records           Med Note [facility], COURAGE   Wed Dec 24, 2023 12:28 PM)    Nintedanib (OFEV ) 100 MG CAPS [id number] Yes Take 1 capsule (100 mg total) by mouth 2 (two) times daily. Geronimo Amel, MD  Active Spouse/Significant Other, Self, Pharmacy Records           Med Note (CRUTHIS, CHLOE C   Tue Dec 23, 2023 12:46 PM) Per wife the pt has only been taking this medication once daily.   omeprazole  (PRILOSEC) 40 MG capsule [id number] Yes Take 1 capsule by mouth once daily Duanne Butler DASEN, MD  Active Spouse/Significant Other, Self, Pharmacy Records  senna-docusate (SENOKOT-S) 8.6-50 MG tablet  [id number] Yes Take 2 tablets by mouth at bedtime. Pearlean Manus, MD  Active   tamsulosin  (FLOMAX ) 0.4 MG CAPS capsule [id number] Yes Take 1 capsule (0.4 mg total) by mouth daily. Pearlean Manus, MD  Active   VENTOLIN  HFA 108 (90 Base) MCG/ACT inhaler [id number] Yes Inhale 2 puffs into the lungs every 6 (six) hours as needed for wheezing or shortness  of breath. Kayla Jeoffrey RAMAN, FNP  Active Spouse/Significant Other, Self, Pharmacy Records  zafirlukast  (ACCOLATE ) 20 MG tablet [id number] Yes Take 1 tablet (20 mg total) by mouth 2 (two) times daily. Duanne Butler DASEN, MD  Active Spouse/Significant Other, Self, Pharmacy Records           Today's Vitals   12/25/23 1100 12/25/23 1105  BP: 135/71   Pulse: 73   Weight: 244 lb (110.7 kg)   PainSc: 0-No pain 0-No pain     Home Care and Equipment/Supplies: Were Home Health Services Ordered?: No Any new equipment or medical supplies ordered?: No  Functional Questionnaire: Do you need assistance with bathing/showering or dressing?: No Do you need assistance with meal preparation?: No Do you need assistance with eating?: No Do you have difficulty maintaining continence: Yes (due to stent) Do you need assistance with getting out of bed/getting out of a chair/moving?: No Do you have difficulty managing or taking your medications?: No  Follow up appointments reviewed: PCP Follow-up appointment confirmed?: No (will call today and make an appointment) Specialist Hospital Follow-up appointment confirmed?: Yes Date of Specialist follow-up appointment?: 12/26/23 Follow-Up Specialty Provider:: Urology Do you need transportation to your follow-up appointment?: No Do you understand care options if your condition(s) worsen?: Yes-patient verbalized understanding  SDOH Interventions Today    Flowsheet Row Most Recent Value  SDOH Interventions   Food Insecurity Interventions Intervention Not Indicated  Housing Interventions Intervention Not Indicated  Transportation Interventions Intervention Not Indicated  Utilities Interventions Intervention Not Indicated   Patient reports that he is doing well. Reports that he will see urology tomorrow to have stent removed.  Denies any new problems or concerns today. Reviewed and offered 30 day TOC program and patient consented.    Goals Addressed              This Visit's Progress    [facility] Transitions of Care (TOC) Care Plan       Problems:  Recent Hospitalization for treatment of right renal kidney stone and UTI, s/p stent placement No Hospital Follow Up Provider appointment    Goal:   Over the next 30 days, the patient will not experience hospital readmission  Interventions:  Transitions of Care: Doctor Visits  - discussed the importance of doctor visits Reviewed Signs and symptoms of infection Reviewed urinary difficulties.   Small amounts of urine to large amounts of urine that is uncontrollable Reviewed all medications and patient is taking medications as prescribed. Reviewed pending follow up with urology on 12/26/2023 to have stent removed Taught patient how to monitor CBG while on the phone. ( He has a new kit and had never used it) CBG non fasting of   135 today non fasting. Was instructed to restart metformin  at discharge. Encouraged patient to stay hydrated. Reviewed self monitoring of daily weights and BP// Reviewed an offered 30 day TOC program and patient has consented.  Scheduled appointment for next week Encouraged patient to follow up with PCP as directed.  This note sent to PCP  Patient Self Care Activities:  Attend all scheduled provider appointments Call pharmacy for  medication refills 3-7 days in advance of running out of medications Call provider office for new concerns or questions  Notify RN Care Manager of TOC call rescheduling needs Participate in Transition of Care Program/Attend TOC scheduled calls Perform all self care activities independently  Take medications as prescribed   Make PCP appointment Weigh daily - first thing in the morning before you eat or drink anything. Monitor BP daily   Plan:  Telephone follow up appointment with care management team member scheduled for:  01/02/2024  at 0900 with Mliss Creed RN.          Alan Ee, RN, BSN, CEN [employer]- Transition of Care  Team.  [facility] [phone]

## 2023-12-25 NOTE — Anesthesia Postprocedure Evaluation (Signed)
 Anesthesia Post Note  Patient: William Howell  Procedure(s) Performed: CYSTOSCOPY/URETEROSCOPY/HOLMIUM LASER/STENT PLACEMENT (Right: Ureter)     Anesthesia Type: General Anesthetic complications: no   No notable events documented.  Last Vitals:  Vitals:   12/24/23 1155 12/24/23 1429  BP: 134/78 134/78  Pulse: (!) 53 (!) 53  Resp:    Temp: 36.7 C 36.7 C  SpO2: 95% 99%    Last Pain:  Vitals:   12/24/23 1429  TempSrc: Oral  PainSc:                  [place] MARLA Cornea

## 2023-12-26 DIAGNOSIS — N202 Calculus of kidney with calculus of ureter: Secondary | ICD-10-CM | POA: Diagnosis not present

## 2023-12-26 DIAGNOSIS — N132 Hydronephrosis with renal and ureteral calculous obstruction: Secondary | ICD-10-CM | POA: Diagnosis not present

## 2023-12-27 LAB — CULTURE, BLOOD (ROUTINE X 2)
Culture: NO GROWTH
Culture: NO GROWTH
Special Requests: ADEQUATE
Special Requests: ADEQUATE

## 2024-01-01 ENCOUNTER — Encounter: Payer: Self-pay | Admitting: Family Medicine

## 2024-01-01 ENCOUNTER — Ambulatory Visit: Admitting: Family Medicine

## 2024-01-01 VITALS — BP 132/72 | HR 69 | Temp 97.6°F | Ht 66.0 in | Wt 236.8 lb

## 2024-01-01 DIAGNOSIS — N10 Acute tubulo-interstitial nephritis: Secondary | ICD-10-CM

## 2024-01-01 NOTE — Progress Notes (Signed)
 Subjective:    Patient ID: William Howell, male    DOB: 1940-05-05, 83 y.o.   MRN: [id number]  Patient was recently admitted with right sided pyelonephritis.  This was diagnosed at the hospital and a CT scan.  There was a coincidental finding of a distal right ureteral stone causing obstruction.  Patient underwent stone extraction in the hospital and lithotripsy.  He was then treated with IV antibiotics and discharged home on oral antibiotics.  Baseline creatinine is around 1.5.  At admission it was 2.9.  At discharge it was 2.0.  He is currently taking metformin .  However his last A1c was 6.0.  He states he feels much better.  He is back on his losartan  Past Medical History:  Diagnosis Date   Allergy     Asthma    Diabetes mellitus type II, controlled (HCC)    GERD (gastroesophageal reflux disease)    pt states he takes omeprazole  but doesnt have GERD   Hypertension    Obesity    Sleep apnea    uses CPAP   Past Surgical History:  Procedure Laterality Date   CHOLECYSTECTOMY     CYSTOSCOPY/URETEROSCOPY/HOLMIUM LASER/STENT PLACEMENT Right 12/23/2023   Procedure: CYSTOSCOPY/URETEROSCOPY/HOLMIUM LASER/STENT PLACEMENT;  Surgeon: Alvaro Ricardo KATHEE Mickey., MD;  Location: [facility];  Service: Urology;  Laterality: Right;  Possible stent placement   DENTAL SURGERY     Current Outpatient Medications on File Prior to Visit  Medication Sig Dispense Refill   acetaminophen  (TYLENOL ) 325 MG tablet Take 2 tablets (650 mg total) by mouth every 6 (six) hours as needed for mild pain (pain score 1-3) or fever (or Fever >/= 101).     benralizumab  (FASENRA  PEN) 30 MG/ML prefilled autoinjector Inject 30mg  into the skin at Week 8 and every 8 weeks 1 mL 1   Budeson-Glycopyrrol-Formoterol  (BREZTRI  AEROSPHERE) 160-9-4.8 MCG/ACT AERO Inhale 2 puffs into the lungs 2 (two) times daily. 10.7 g 11   cetirizine  (ZYRTEC ) 10 MG tablet Take 1 tablet by mouth once daily 90 tablet 0   cyanocobalamin  (VITAMIN B12) 500 MCG  tablet Take 1 tablet (500 mcg total) by mouth daily. 30 tablet 4   ferrous sulfate  325 (65 FE) MG tablet Take 1 tablet (325 mg total) by mouth every Tuesday, Thursday, Saturday, and Sunday. 45 tablet 3   losartan  (COZAAR ) 50 MG tablet Take 1 tablet by mouth once daily 90 tablet 1   Nintedanib (OFEV ) 100 MG CAPS Take 1 capsule (100 mg total) by mouth 2 (two) times daily. 180 capsule 1   omeprazole  (PRILOSEC) 40 MG capsule Take 1 capsule by mouth once daily 90 capsule 1   senna-docusate (SENOKOT-S) 8.6-50 MG tablet Take 2 tablets by mouth at bedtime. 60 tablet 4   tamsulosin  (FLOMAX ) 0.4 MG CAPS capsule Take 1 capsule (0.4 mg total) by mouth daily. 90 capsule 3   VENTOLIN  HFA 108 (90 Base) MCG/ACT inhaler Inhale 2 puffs into the lungs every 6 (six) hours as needed for wheezing or shortness of breath. 18 g 0   zafirlukast  (ACCOLATE ) 20 MG tablet Take 1 tablet (20 mg total) by mouth 2 (two) times daily. 180 tablet 3   No current facility-administered medications on file prior to visit.   No Known Allergies Social History   Socioeconomic History   Marital status: Married    Spouse name: Channing   Number of children: 5   Years of education: Not on file   Highest education level: 12th grade  Occupational History  Not on file  Tobacco Use   Smoking status: Never   Smokeless tobacco: Former    Types: Engineer, drilling   Vaping status: Never Used  Substance and Sexual Activity   Alcohol use: Yes    Comment: occasional    Drug use: No   Sexual activity: Not Currently  Other Topics Concern   Not on file  Social History Narrative   3 children, 2 stepchildren   12 grandchildren   7 great grandchildren.   Retired Psychologist, forensic.    Social Drivers of Corporate investment banker Strain: Low Risk  (11/19/2023)   Overall Financial Resource Strain (CARDIA)    Difficulty of Paying Living Expenses: Not hard at all  Food Insecurity: No Food Insecurity (12/25/2023)   Hunger Vital Sign     Worried About Running Out of Food in the Last Year: Never true    Ran Out of Food in the Last Year: Never true  Transportation Needs: No Transportation Needs (12/25/2023)   PRAPARE - Administrator, Civil Service (Medical): No    Lack of Transportation (Non-Medical): No  Physical Activity: Sufficiently Active (11/19/2023)   Exercise Vital Sign    Days of Exercise per Week: 6 days    Minutes of Exercise per Session: 60 min  Stress: No Stress Concern Present (11/19/2023)   [employer] of Occupational Health - Occupational Stress Questionnaire    Feeling of Stress: Only a little  Social Connections: Moderately Isolated (12/22/2023)   Social Connection and Isolation Panel    Frequency of Communication with Friends and Family: More than three times a week    Frequency of Social Gatherings with Friends and Family: More than three times a week    Attends Religious Services: Patient declined    Database administrator or Organizations: No    Attends Banker Meetings: Never    Marital Status: Married  Catering manager Violence: Not At Risk (12/25/2023)   Humiliation, Afraid, Rape, and Kick questionnaire    Fear of Current or Ex-Partner: No    Emotionally Abused: No    Physically Abused: No    Sexually Abused: No      Review of Systems  All other systems reviewed and are negative.      Objective:   Physical Exam Vitals reviewed.  Constitutional:      General: He is not in acute distress.    Appearance: He is well-developed. He is not diaphoretic.  HENT:     Head: Normocephalic and atraumatic.     Right Ear: Tympanic membrane, ear canal and external ear normal.     Left Ear: Tympanic membrane, ear canal and external ear normal.     Nose: No congestion or rhinorrhea.     Mouth/Throat:     Mouth: Mucous membranes are moist.     Pharynx: Oropharynx is clear. No oropharyngeal exudate or posterior oropharyngeal erythema.  Eyes:     General: No scleral icterus.        Right eye: No discharge.        Left eye: No discharge.     Conjunctiva/sclera: Conjunctivae normal.     Pupils: Pupils are equal, round, and reactive to light.  Neck:     Thyroid: No thyromegaly.     Vascular: No JVD.     Trachea: No tracheal deviation.  Cardiovascular:     Rate and Rhythm: Normal rate and regular rhythm.     Heart  sounds: Normal heart sounds. No murmur heard.    No friction rub. No gallop.  Pulmonary:     Effort: Pulmonary effort is normal. No accessory muscle usage, prolonged expiration or respiratory distress.     Breath sounds: No stridor or decreased air movement. No wheezing, rhonchi or rales.  Chest:     Chest wall: No tenderness.  Musculoskeletal:     Cervical back: Normal range of motion and neck supple.  Lymphadenopathy:     Cervical: No cervical adenopathy.  Skin:    General: Skin is warm.     Coloration: Skin is not pale.     Findings: No erythema or rash.  Neurological:     Mental Status: He is alert and oriented to person, place, and time.     Cranial Nerves: No cranial nerve deficit.     Motor: No abnormal muscle tone.     Coordination: Coordination normal.     Deep Tendon Reflexes: Reflexes are normal and symmetric.  Psychiatric:        Behavior: Behavior normal.        Thought Content: Thought content normal.        Judgment: Judgment normal.           Assessment & Plan:  Acute pyelonephritis - Plan: CBC with Differential/Platelet, Comprehensive metabolic panel with GFR Pyelonephritis has resolved.  The patient is feeling much better.  Repeat CMP to monitor for resolution of his acute renal insufficiency.  Discontinue metformin  due to elevated creatinine.  Recommended working on diet and exercise and rechecking hemoglobin A1c in 3 months.  Patient will try to maintain his blood sugars just with diet and exercise as he has lost a lot of weight and is doing better now.  His last A1c was 6.0.  This

## 2024-01-02 ENCOUNTER — Other Ambulatory Visit: Payer: Self-pay | Admitting: *Deleted

## 2024-01-02 ENCOUNTER — Ambulatory Visit: Payer: Self-pay | Admitting: Family Medicine

## 2024-01-02 LAB — CBC WITH DIFFERENTIAL/PLATELET
Absolute Lymphocytes: 2867 {cells}/uL (ref 850–3900)
Absolute Monocytes: 884 {cells}/uL (ref 200–950)
Basophils Absolute: 9 {cells}/uL (ref 0–200)
Basophils Relative: 0.1 %
Eosinophils Absolute: 0 {cells}/uL — ABNORMAL LOW (ref 15–500)
Eosinophils Relative: 0 %
HCT: 41.8 % (ref 38.5–50.0)
Hemoglobin: 12.9 g/dL — ABNORMAL LOW (ref 13.2–17.1)
MCH: 28.9 pg (ref 27.0–33.0)
MCHC: 30.9 g/dL — ABNORMAL LOW (ref 32.0–36.0)
MCV: 93.7 fL (ref 80.0–100.0)
MPV: 10 fL (ref 7.5–12.5)
Monocytes Relative: 9.4 %
Neutro Abs: 5640 {cells}/uL (ref 1500–7800)
Neutrophils Relative %: 60 %
Platelets: 369 Thousand/uL (ref 140–400)
RBC: 4.46 Million/uL (ref 4.20–5.80)
RDW: 14.1 % (ref 11.0–15.0)
Total Lymphocyte: 30.5 %
WBC: 9.4 Thousand/uL (ref 3.8–10.8)

## 2024-01-02 LAB — COMPREHENSIVE METABOLIC PANEL WITH GFR
AG Ratio: 1.6 (calc) (ref 1.0–2.5)
ALT: 10 U/L (ref 9–46)
AST: 14 U/L (ref 10–35)
Albumin: 3.9 g/dL (ref 3.6–5.1)
Alkaline phosphatase (APISO): 64 U/L (ref 35–144)
BUN/Creatinine Ratio: 13 (calc) (ref 6–22)
BUN: 18 mg/dL (ref 7–25)
CO2: 30 mmol/L (ref 20–32)
Calcium: 9.1 mg/dL (ref 8.6–10.3)
Chloride: 105 mmol/L (ref 98–110)
Creat: 1.37 mg/dL — ABNORMAL HIGH (ref 0.70–1.22)
Globulin: 2.4 g/dL (ref 1.9–3.7)
Glucose, Bld: 135 mg/dL — ABNORMAL HIGH (ref 65–99)
Potassium: 4.9 mmol/L (ref 3.5–5.3)
Sodium: 141 mmol/L (ref 135–146)
Total Bilirubin: 0.5 mg/dL (ref 0.2–1.2)
Total Protein: 6.3 g/dL (ref 6.1–8.1)
eGFR: 51 mL/min/1.73m2 — ABNORMAL LOW (ref >=60)

## 2024-01-02 NOTE — Patient Instructions (Signed)
 Visit Information  Thank you for taking time to visit with me today. Please don't hesitate to contact me if I can be of assistance to you before our next scheduled telephone appointment.  Our next appointment is by telephone on 01/15/24 @ 930 am  Following is a copy of your care plan:   Goals Addressed             This Visit's Progress    [facility] Transitions of Care (TOC) Care Plan       Problems:  Recent Hospitalization for treatment of right renal kidney stone and UTI, s/p stent placement Pt saw primary care provider 01/01/24 Pt states stent was removed last Friday, pt is feeling much better, still having some frequency with urine Pt is going on vacation next week, declines telephone outreach next week, wants a call the week after Pt is monitoring blood pressure daily at home, recent reading 130/70  Goal:   Over the next 30 days, the patient will not experience hospital readmission  Interventions:  Transitions of Care: Doctor Visits  - discussed the importance of doctor visits Reviewed Signs and symptoms of infection Reviewed importance of taking medications as prescribed Reviewed carbohydrate modified diet Encouraged patient to stay hydrated, preferably water Reinforced self monitoring of daily weights, BP, CBG  Patient Self Care Activities:  Attend all scheduled provider appointments Call pharmacy for medication refills 3-7 days in advance of running out of medications Call provider office for new concerns or questions  Notify RN Care Manager of TOC call rescheduling needs Participate in Transition of Care Program/Attend TOC scheduled calls Perform all self care activities independently  Take medications as prescribed   Weigh daily - first thing in the morning before you eat or drink anything. Monitor blood pressure daily  Plan:  Telephone follow up appointment with care management team member scheduled for:  01/15/24 @ 930 am (pt is on vacation next week, prefers a call  the following week          Patient verbalizes understanding of instructions and care plan provided today and agrees to view in MyChart. Active MyChart status and patient understanding of how to access instructions and care plan via MyChart confirmed with patient.     Telephone follow up appointment with care management team member scheduled for: 01/15/24 @ 930 am  Please call the care guide team at [phone] if you need to cancel or reschedule your appointment.   Please call the Suicide and Crisis Lifeline: 988 call the [place]  National Suicide Prevention Lifeline: 680-346-6342 or TTY: 302-275-9279 TTY 4403453244) to talk to a trained counselor call 1-800-273-TALK (toll free, 24 hour hotline) go to [facility] [address] [place] [phone]) call 911 if you are experiencing a Mental Health or Behavioral Health Crisis or need someone to talk to.  Mliss Creed [facility] BSN RN Care Manager/ Transition of Care [place]/ [facility] [phone]

## 2024-01-02 NOTE — Patient Outreach (Signed)
 Transition of Care week 2  Visit Note  01/02/2024  Name: William Howell MRN: [id number]          DOB: Mar 25, 1941  Situation: Patient enrolled in [facility] 30-day program. Visit completed with patient by telephone.   Background:    Past Medical History:  Diagnosis Date   Allergy     Asthma    Diabetes mellitus type II, controlled (HCC)    GERD (gastroesophageal reflux disease)    pt states he takes omeprazole  but doesnt have GERD   Hypertension    Obesity    Sleep apnea    uses CPAP    Assessment: Patient Reported Symptoms: Cognitive Cognitive Status: No symptoms reported, Alert and oriented to person, place, and time, Normal speech and language skills, Able to follow simple commands      Neurological Neurological Review of Symptoms: No symptoms reported    HEENT HEENT Symptoms Reported: No symptoms reported      Cardiovascular Cardiovascular Symptoms Reported: No symptoms reported Does patient have uncontrolled Hypertension?: No Weight: 233 lb (105.7 kg) Cardiovascular Self-Management Outcome: 4 (good)  Respiratory Respiratory Symptoms Reported: No symptoms reported    Endocrine Endocrine Symptoms Reported: No symptoms reported Is patient diabetic?: Yes Is patient checking blood sugars at home?: Yes List most recent blood sugar readings, include date and time of day: patient's daughter in law is checking CBG few times per week, pt does not recall recent readings Endocrine Self-Management Outcome: 4 (good) Endocrine Comment: Reviewed carbohydrate modified diet  Gastrointestinal Gastrointestinal Symptoms Reported: No symptoms reported      Genitourinary Genitourinary Symptoms Reported: Frequency Additional Genitourinary Details: pt reports stent was removed last Friday, reports  still can't always control my urine but it's getting a little better Genitourinary Management Strategies: Medication therapy Genitourinary Self-Management Outcome: 4 (good)   Integumentary Integumentary Symptoms Reported: No symptoms reported    Musculoskeletal Musculoskelatal Symptoms Reviewed: No symptoms reported        Psychosocial Psychosocial Symptoms Reported: No symptoms reported         Vitals:   01/02/24 0909  BP: 130/70    Medications Reviewed Today     Reviewed by Aura Mliss LABOR, RN (Registered Nurse) on 01/02/24 at [phone]  Med List Status: <None>   Medication Order Taking? Sig Documenting Provider Last Dose Status Informant  acetaminophen  (TYLENOL ) 325 MG tablet [id number]  Take 2 tablets (650 mg total) by mouth every 6 (six) hours as needed for mild pain (pain score 1-3) or fever (or Fever >/= 101). Pearlean Manus, MD  Active   benralizumab  (FASENRA  PEN) 30 MG/ML prefilled autoinjector [id number]  Inject 30mg  into the skin at Week 8 and every 8 weeks Geronimo Amel, MD  Active Spouse/Significant Other, Self, Pharmacy Records  Budeson-Glycopyrrol-Formoterol  (BREZTRI  AEROSPHERE) 160-9-4.8 MCG/ACT AERO [id number]  Inhale 2 puffs into the lungs 2 (two) times daily. Duanne Butler DASEN, MD  Active Spouse/Significant Other, Self, Pharmacy Records  cetirizine  (ZYRTEC ) 10 MG tablet [id number]  Take 1 tablet by mouth once daily Duanne Butler DASEN, MD  Active Spouse/Significant Other, Self, Pharmacy Records  cyanocobalamin  (VITAMIN B12) 500 MCG tablet [id number]  Take 1 tablet (500 mcg total) by mouth daily. Pearlean Manus, MD  Active   ferrous sulfate  325 (65 FE) MG tablet [id number]  Take 1 tablet (325 mg total) by mouth every Tuesday, Thursday, Saturday, and Sunday. Pearlean Manus, MD  Active   losartan  (COZAAR ) 50 MG tablet [id number]  Take 1 tablet by mouth once daily Duanne Butler DASEN, MD  Active Spouse/Significant Other, Self, Pharmacy Records           Med Note [facility], [facility]   Wed Dec 24, 2023 12:28 PM)    Nintedanib (OFEV ) 100 MG CAPS [id number]  Take 1 capsule (100 mg total) by mouth 2 (two) times daily. Geronimo Amel, MD  Active  Spouse/Significant Other, Self, Pharmacy Records           Med Note (CRUTHIS, CHLOE C   Tue Dec 23, 2023 12:46 PM) Per wife the pt has only been taking this medication once daily.   omeprazole  (PRILOSEC) 40 MG capsule [id number]  Take 1 capsule by mouth once daily Duanne Butler DASEN, MD  Active Spouse/Significant Other, Self, Pharmacy Records  senna-docusate (SENOKOT-S) 8.6-50 MG tablet [id number]  Take 2 tablets by mouth at bedtime. Pearlean Manus, MD  Active   tamsulosin  (FLOMAX ) 0.4 MG CAPS capsule [id number]  Take 1 capsule (0.4 mg total) by mouth daily. Pearlean Manus, MD  Active   VENTOLIN  HFA 108 (90 Base) MCG/ACT inhaler [id number]  Inhale 2 puffs into the lungs every 6 (six) hours as needed for wheezing or shortness of breath. Kayla Jeoffrey RAMAN, FNP  Active Spouse/Significant Other, Self, Pharmacy Records  zafirlukast  (ACCOLATE ) 20 MG tablet [id number]  Take 1 tablet (20 mg total) by mouth 2 (two) times daily. Duanne Butler DASEN, MD  Active Spouse/Significant Other, Self, Pharmacy Records            Goals Addressed             This Visit's Progress    VBCI Transitions of Care (TOC) Care Plan       Problems:  Recent Hospitalization for treatment of right renal kidney stone and UTI, s/p stent placement Pt saw primary care provider 01/01/24 Pt states stent was removed last Friday, pt is feeling much better, still having some frequency with urine Pt is going on vacation next week, declines telephone outreach next week, wants a call the week after Pt is monitoring blood pressure daily at home, recent reading 130/70  Goal:   Over the next 30 days, the patient will not experience hospital readmission  Interventions:  Transitions of Care: Doctor Visits  - discussed the importance of doctor visits Reviewed Signs and symptoms of infection Reviewed importance of taking medications as prescribed Reviewed carbohydrate modified diet Encouraged patient to stay hydrated, preferably  water Reinforced self monitoring of daily weights, BP, CBG  Patient Self Care Activities:  Attend all scheduled provider appointments Call pharmacy for medication refills 3-7 days in advance of running out of medications Call provider office for new concerns or questions  Notify RN Care Manager of TOC call rescheduling needs Participate in Transition of Care Program/Attend TOC scheduled calls Perform all self care activities independently  Take medications as prescribed   Weigh daily - first thing in the morning before you eat or drink anything. Monitor blood pressure daily  Plan:  Telephone follow up appointment with care management team member scheduled for:  01/15/24 @ 930 am (pt is on vacation next week, prefers a call the following week          Recommendation:   PCP Follow-up  Follow Up Plan:   Telephone follow-up 01/15/24 @ 930 am  Mliss Creed [facility], BSN RN Care Manager/ Transition of Care [place]/ [facility] [phone]

## 2024-01-14 ENCOUNTER — Other Ambulatory Visit (HOSPITAL_COMMUNITY): Payer: Self-pay

## 2024-01-14 ENCOUNTER — Telehealth: Payer: Self-pay | Admitting: *Deleted

## 2024-01-14 ENCOUNTER — Telehealth: Payer: Self-pay

## 2024-01-14 DIAGNOSIS — D721 Eosinophilia, unspecified: Secondary | ICD-10-CM

## 2024-01-14 DIAGNOSIS — J45991 Cough variant asthma: Secondary | ICD-10-CM

## 2024-01-14 MED ORDER — FASENRA PEN 30 MG/ML ~~LOC~~ SOAJ: 30.0000 mg | SUBCUTANEOUS | 2 refills | Status: DC

## 2024-01-14 NOTE — Telephone Encounter (Signed)
Will address in new encounter

## 2024-01-14 NOTE — Telephone Encounter (Signed)
 Received notification from telephone encounter that pt is unable to get his Fasenra . Reached out to the pt and he stated [facility] needs a new prescription and he is due for his next dose on 10/8. Advised pt I will contact [facility] and follow up with him.  Called [facility] [phone] and was advised by the rep they currently have an order out that was shipped on 9/29. They stated tracking information is not available yet but they will send it to the pt when they receive it. They stated pt should receive the medication in 6-8 business days. However they also said the pt will need a new prescription for after this fill.  Called pt to inform him above. Will send him a mychart message with my direct line and phone number for [facility].

## 2024-01-14 NOTE — Telephone Encounter (Signed)
 Refill sent to [facility] for Fasenra .   Aleck Puls, PharmD, BCPS, CPP Clinical Pharmacist  [facility]

## 2024-01-14 NOTE — Telephone Encounter (Signed)
 Copied from CRM [id number]. Topic: Clinical - Prescription Issue >> Jan 14, 2024  8:57 AM Nathanel DEL wrote: Reason for CRM: pt states he takes the injection Fasenra  and he cannot get it filled until they speak to someone in the pharmacy.  Please call pt

## 2024-01-15 ENCOUNTER — Other Ambulatory Visit: Payer: Self-pay | Admitting: *Deleted

## 2024-01-15 NOTE — Patient Outreach (Signed)
 Transition of Care week 3  Visit Note  01/15/2024  Name: William Howell MRN: [id number]          DOB: [birth year]  Situation: Patient enrolled in [facility] 30-day program. Visit completed with patient by telephone.   Background:    Past Medical History:  Diagnosis Date   Allergy     Asthma    Diabetes mellitus type II, controlled (HCC)    GERD (gastroesophageal reflux disease)    pt states he takes omeprazole  but doesnt have GERD   Hypertension    Obesity    Sleep apnea    uses CPAP    Assessment: Patient Reported Symptoms: Cognitive Cognitive Status: No symptoms reported, Alert and oriented to person, place, and time, Normal speech and language skills, Able to follow simple commands      Neurological Neurological Review of Symptoms: No symptoms reported    HEENT HEENT Symptoms Reported: No symptoms reported      Cardiovascular Cardiovascular Symptoms Reported: No symptoms reported Does patient have uncontrolled Hypertension?: No Weight: 236 lb (107 kg) Cardiovascular Comment: pt states he has been eating more while on vacation, gained a few pounds over the course of 2 weeks  Respiratory Respiratory Symptoms Reported: No symptoms reported    Endocrine Endocrine Symptoms Reported: No symptoms reported Is patient diabetic?: Yes Is patient checking blood sugars at home?: No List most recent blood sugar readings, include date and time of day: pt has not been checking CBG recently due to being on vacation, will try to start back Endocrine Self-Management Outcome: 4 (good) Endocrine Comment: Reinforced carbohydrate modified diet, pt states he has been eating more food including alot of bread and donuts since being on vacation  Gastrointestinal Gastrointestinal Symptoms Reported: No symptoms reported      Genitourinary Genitourinary Symptoms Reported: No symptoms reported Additional Genitourinary Details: pt states urinary symptoms have resolved, pt states  I'm finally  sleeping through the night Genitourinary Management Strategies: Medication therapy Genitourinary Self-Management Outcome: 4 (good)  Integumentary Integumentary Symptoms Reported: No symptoms reported    Musculoskeletal Musculoskelatal Symptoms Reviewed: No symptoms reported        Psychosocial Psychosocial Symptoms Reported: No symptoms reported         There were no vitals filed for this visit.  Medications Reviewed Today     Reviewed by Aura Mliss LABOR, RN (Registered Nurse) on 01/15/24 at [phone]  Med List Status: <None>   Medication Order Taking? Sig Documenting Provider Last Dose Status Informant  acetaminophen  (TYLENOL ) 325 MG tablet [id number]  Take 2 tablets (650 mg total) by mouth every 6 (six) hours as needed for mild pain (pain score 1-3) or fever (or Fever >/= 101). Pearlean Manus, MD  Active   benralizumab  (FASENRA  PEN) 30 MG/ML prefilled autoinjector [id number]  Inject 1 mL (30 mg total) into the skin every 8 (eight) weeks. Geronimo Amel, MD  Active   Budeson-Glycopyrrol-Formoterol  (BREZTRI  AEROSPHERE) 160-9-4.8 MCG/ACT TERESE [id number]  Inhale 2 puffs into the lungs 2 (two) times daily. Duanne Butler DASEN, MD  Active Spouse/Significant Other, Self, Pharmacy Records  cetirizine  (ZYRTEC ) 10 MG tablet [id number]  Take 1 tablet by mouth once daily Duanne Butler DASEN, MD  Active Spouse/Significant Other, Self, Pharmacy Records  cyanocobalamin  (VITAMIN B12) 500 MCG tablet [id number]  Take 1 tablet (500 mcg total) by mouth daily. Pearlean Manus, MD  Active   ferrous sulfate  325 (65 FE) MG tablet [id number]  Take 1 tablet (325 mg total) by mouth every Tuesday, Thursday, Saturday, and Sunday.  Pearlean Manus, MD  Active   losartan  (COZAAR ) 50 MG tablet [id number]  Take 1 tablet by mouth once daily Duanne Butler DASEN, MD  Active Spouse/Significant Other, Self, Pharmacy Records           Med Note [facility], COUR[facility]   Wed Dec 24, 2023 12:28 PM)    Nintedanib (OFEV ) 100 MG CAPS [id number]   Take 1 capsule (100 mg total) by mouth 2 (two) times daily. Geronimo Amel, MD  Active Spouse/Significant Other, Self, Pharmacy Records           Med Note (CRUTHIS, CHLOE C   Tue Dec 23, 2023 12:46 PM) Per wife the pt has only been taking this medication once daily.   omeprazole  (PRILOSEC) 40 MG capsule [id number]  Take 1 capsule by mouth once daily Duanne Butler DASEN, MD  Active Spouse/Significant Other, Self, Pharmacy Records  senna-docusate (SENOKOT-S) 8.6-50 MG tablet [id number]  Take 2 tablets by mouth at bedtime. Pearlean Manus, MD  Active   tamsulosin  (FLOMAX ) 0.4 MG CAPS capsule [id number]  Take 1 capsule (0.4 mg total) by mouth daily. Pearlean Manus, MD  Active   VENTOLIN  HFA 108 (90 Base) MCG/ACT inhaler [id number]  Inhale 2 puffs into the lungs every 6 (six) hours as needed for wheezing or shortness of breath. Kayla Jeoffrey RAMAN, FNP  Active Spouse/Significant Other, Self, Pharmacy Records  zafirlukast  (ACCOLATE ) 20 MG tablet [id number]  Take 1 tablet (20 mg total) by mouth 2 (two) times daily. Duanne Butler DASEN, MD  Active Spouse/Significant Other, Self, Pharmacy Records             Goals Addressed             This Visit's Progress    VBCI Transitions of Care (TOC) Care Plan       Problems:  Recent Hospitalization for treatment of right renal kidney stone and UTI, s/p stent placement Pt saw primary care provider 01/01/24 Pt states stent was removed last Friday, pt is feeling much better, still having some frequency with urine Pt is going on vacation next week, declines telephone outreach next week, wants a call the week after 01/15/24-Pt is not monitoring blood pressure consistently, pt has been on vacation, plans to start back checking BP daily and keeping a log, pt states  had one reading of 162/78, pt has been eating more food including bread and donuts  Goal:   Over the next 30 days, the patient will not experience hospital readmission  Interventions:  Transitions of  Care: Doctor Visits  - discussed the importance of doctor visits Reviewed Signs and symptoms of infection Reviewed importance of taking medications as prescribed Reinforced carbohydrate modified diet, importance of healthy eating Encouraged patient to stay hydrated, preferably water Reviewed self monitoring of daily weights, BP, CBG  Patient Self Care Activities:  Attend all scheduled provider appointments Call pharmacy for medication refills 3-7 days in advance of running out of medications Call provider office for new concerns or questions  Notify RN Care Manager of TOC call rescheduling needs Participate in Transition of Care Program/Attend TOC scheduled calls Perform all self care activities independently  Take medications as prescribed   Weigh daily - first thing in the morning before you eat or drink anything, keep a log Monitor blood pressure daily, keep a log Monitor blood sugar, keep a log Avoid concentrated sweets in your diet, be mindful of your intake of carbohydrates such as bread, rice, pasta, limit these foods  Plan:  Telephone follow up appointment  with care management team member scheduled for:  01/22/24 @ 930 am          Recommendation:   PCP Follow-up  Follow Up Plan:   Telephone follow-up 01/22/24 @ 930 am  Mliss Creed [facility], BSN RN Care Manager/ Transition of Care [place]/ [facility] [phone]

## 2024-01-15 NOTE — Patient Instructions (Signed)
 Visit Information  Thank you for taking time to visit with me today. Please don't hesitate to contact me if I can be of assistance to you before our next scheduled telephone appointment.  Our next appointment is by telephone on 01/22/24 @ 930 am  Following is a copy of your care plan:   Goals Addressed             This Visit's Progress    VBCI Transitions of Care (TOC) Care Plan       Problems:  Recent Hospitalization for treatment of right renal kidney stone and UTI, s/p stent placement Pt saw primary care provider 01/01/24 Pt states stent was removed last Friday, pt is feeling much better, still having some frequency with urine Pt is going on vacation next week, declines telephone outreach next week, wants a call the week after 01/15/24-Pt is not monitoring blood pressure consistently, pt has been on vacation, plans to start back checking BP daily and keeping a log, pt states  had one reading of 162/78, pt has been eating more food including bread and donuts  Goal:   Over the next 30 days, the patient will not experience hospital readmission  Interventions:  Transitions of Care: Doctor Visits  - discussed the importance of doctor visits Reviewed Signs and symptoms of infection Reviewed importance of taking medications as prescribed Reinforced carbohydrate modified diet, importance of healthy eating Encouraged patient to stay hydrated, preferably water Reviewed self monitoring of daily weights, BP, CBG  Patient Self Care Activities:  Attend all scheduled provider appointments Call pharmacy for medication refills 3-7 days in advance of running out of medications Call provider office for new concerns or questions  Notify RN Care Manager of TOC call rescheduling needs Participate in Transition of Care Program/Attend TOC scheduled calls Perform all self care activities independently  Take medications as prescribed   Weigh daily - first thing in the morning before you eat or  drink anything, keep a log Monitor blood pressure daily, keep a log Monitor blood sugar, keep a log Avoid concentrated sweets in your diet, be mindful of your intake of carbohydrates such as bread, rice, pasta, limit these foods  Plan:  Telephone follow up appointment with care management team member scheduled for:  01/22/24 @ 930 am          Patient verbalizes understanding of instructions and care plan provided today and agrees to view in MyChart. Active MyChart status and patient understanding of how to access instructions and care plan via MyChart confirmed with patient.     Telephone follow up appointment with care management team member scheduled for: 01/22/24 @ 930 am  Please call the care guide team at [phone] if you need to cancel or reschedule your appointment.   Please call the Suicide and Crisis Lifeline: 988 call the [place]  National Suicide Prevention Lifeline: 573-256-2407 or TTY: 989-881-4119 TTY 218-579-4750) to talk to a trained counselor call 1-800-273-TALK (toll free, 24 hour hotline) go to [facility] [address], [place] [phone]) call 911 if you are experiencing a Mental Health or Behavioral Health Crisis or need someone to talk to.  Mliss Creed [facility], BSN RN Care Manager/ Transition of Care [place]/ [facility] [phone]

## 2024-01-21 ENCOUNTER — Other Ambulatory Visit: Payer: Self-pay | Admitting: Family Medicine

## 2024-01-22 ENCOUNTER — Other Ambulatory Visit (HOSPITAL_COMMUNITY): Payer: Self-pay

## 2024-01-22 ENCOUNTER — Other Ambulatory Visit: Payer: Self-pay | Admitting: *Deleted

## 2024-01-22 NOTE — Patient Outreach (Signed)
 Transition of Care week 4  Visit Note  01/22/2024  Name: William Howell MRN: [id number]          DOB: [birth year]  Situation: Patient enrolled in [facility] 30-day program. Visit completed with patient by telephone.   Background:     Past Medical History:  Diagnosis Date   Allergy     Asthma    Diabetes mellitus type II, controlled (HCC)    GERD (gastroesophageal reflux disease)    pt states he takes omeprazole  but doesnt have GERD   Hypertension    Obesity    Sleep apnea    uses CPAP    Assessment: Patient Reported Symptoms: Cognitive Cognitive Status: No symptoms reported, Alert and oriented to person, place, and time, Normal speech and language skills, Able to follow simple commands      Neurological Neurological Review of Symptoms: No symptoms reported    HEENT HEENT Symptoms Reported: No symptoms reported      Cardiovascular Cardiovascular Symptoms Reported: No symptoms reported Does patient have uncontrolled Hypertension?: No Weight: 234 lb (106.1 kg) Cardiovascular Self-Management Outcome: 4 (good) Cardiovascular Comment: pt is weighing daily  Respiratory Respiratory Symptoms Reported: No symptoms reported    Endocrine Endocrine Symptoms Reported: No symptoms reported Is patient diabetic?: Yes Is patient checking blood sugars at home?: No List most recent blood sugar readings, include date and time of day: pt does not check CBG consistently,  daughter in law checked random CBG this morning- reading 190 Endocrine Self-Management Outcome: 4 (good) Endocrine Comment: Reviewed carbohydrate modified diet  Gastrointestinal Gastrointestinal Symptoms Reported: No symptoms reported      Genitourinary Genitourinary Symptoms Reported: No symptoms reported    Integumentary Integumentary Symptoms Reported: No symptoms reported    Musculoskeletal Musculoskelatal Symptoms Reviewed: No symptoms reported        Psychosocial Psychosocial Symptoms Reported: No  symptoms reported         Vitals:   01/22/24 0950  BP: 132/74    Medications Reviewed Today     Reviewed by Aura Mliss LABOR, RN (Registered Nurse) on 01/22/24 at 0945  Med List Status: <None>   Medication Order Taking? Sig Documenting Provider Last Dose Status Informant  acetaminophen  (TYLENOL ) 325 MG tablet [id number]  Take 2 tablets (650 mg total) by mouth every 6 (six) hours as needed for mild pain (pain score 1-3) or fever (or Fever >/= 101). Pearlean Manus, MD  Active   benralizumab  (FASENRA  PEN) 30 MG/ML prefilled autoinjector [id number]  Inject 1 mL (30 mg total) into the skin every 8 (eight) weeks. Geronimo Amel, MD  Active   Budeson-Glycopyrrol-Formoterol  (BREZTRI  AEROSPHERE) 160-9-4.8 MCG/ACT TERESE [id number]  Inhale 2 puffs into the lungs 2 (two) times daily. Duanne Butler DASEN, MD  Active Spouse/Significant Other, Self, Pharmacy Records  cetirizine  (ZYRTEC ) 10 MG tablet [id number]  Take 1 tablet by mouth once daily Duanne Butler DASEN, MD  Active Spouse/Significant Other, Self, Pharmacy Records  cyanocobalamin  (VITAMIN B12) 500 MCG tablet [id number]  Take 1 tablet (500 mcg total) by mouth daily. Pearlean Manus, MD  Active   ferrous sulfate  325 (65 FE) MG tablet [id number]  Take 1 tablet (325 mg total) by mouth every Tuesday, Thursday, Saturday, and Sunday. Pearlean Manus, MD  Active   losartan  (COZAAR ) 50 MG tablet [id number]  Take 1 tablet by mouth once daily Duanne Butler DASEN, MD  Active Spouse/Significant Other, Self, Pharmacy Records           Med Note [facility], COURAGE   Wed Dec 24, 2023  12:28 PM)    Nintedanib (OFEV ) 100 MG CAPS [id number]  Take 1 capsule (100 mg total) by mouth 2 (two) times daily. Geronimo Amel, MD  Active Spouse/Significant Other, Self, Pharmacy Records           Med Note (CRUTHIS, CHLOE C   Tue Dec 23, 2023 12:46 PM) Per wife the pt has only been taking this medication once daily.   omeprazole  (PRILOSEC) 40 MG capsule [id number]  Take 1 capsule by  mouth once daily Duanne Butler DASEN, MD  Active Spouse/Significant Other, Self, Pharmacy Records  senna-docusate (SENOKOT-S) 8.6-50 MG tablet [id number]  Take 2 tablets by mouth at bedtime. Pearlean Manus, MD  Active   tamsulosin  (FLOMAX ) 0.4 MG CAPS capsule [id number]  Take 1 capsule (0.4 mg total) by mouth daily. Pearlean Manus, MD  Active   VENTOLIN  HFA 108 (90 Base) MCG/ACT inhaler [id number]  Inhale 2 puffs into the lungs every 6 (six) hours as needed for wheezing or shortness of breath. Kayla Jeoffrey RAMAN, FNP  Active Spouse/Significant Other, Self, Pharmacy Records  zafirlukast  (ACCOLATE ) 20 MG tablet [id number]  Take 1 tablet (20 mg total) by mouth 2 (two) times daily. Duanne Butler DASEN, MD  Active Spouse/Significant Other, Self, Pharmacy Records            Goals Addressed             This Visit's Progress    COMPLETED: VBCI Transitions of Care (TOC) Care Plan       Problems:  Recent Hospitalization for treatment of right renal kidney stone and UTI, s/p stent placement Pt saw primary care provider 01/01/24 Pt states stent was removed last Friday, pt is feeling much better, still having some frequency with urine Pt is going on vacation next week, declines telephone outreach next week, wants a call the week after 01/15/24-Pt is not monitoring blood pressure consistently, pt has been on vacation, plans to start back checking BP daily and keeping a log, pt states  had one reading of 162/78, pt has been eating more food including bread and donuts 01/22/24- pt reports he is doing well, continues daily weights, checks CBG  on occasion  continues checking BP daily with readings per pt  overall good  Goal:   Over the next 30 days, the patient will not experience hospital readmission  Interventions:  Transitions of Care: Doctor Visits  - discussed the importance of doctor visits Reviewed Signs and symptoms of infection Reviewed importance of taking medications as prescribed Reinforced  carbohydrate modified diet, importance of healthy eating Encouraged patient to stay hydrated, preferably water Reinforced self monitoring of daily weights, BP, CBG  Patient Self Care Activities:  Attend all scheduled provider appointments Call pharmacy for medication refills 3-7 days in advance of running out of medications Call provider office for new concerns or questions  Notify RN Care Manager of TOC call rescheduling needs Participate in Transition of Care Program/Attend TOC scheduled calls Perform all self care activities independently  Take medications as prescribed   Weigh daily - first thing in the morning before you eat or drink anything, keep a log Monitor blood pressure daily, keep a log Monitor blood sugar, keep a log Avoid concentrated sweets in your diet, be mindful of your intake of carbohydrates such as bread, rice, pasta, limit these foods TOC case closure  Plan:  No further follow up required: case closure           Recommendation:   PCP Follow-up  Follow Up Plan:  Closing From:  Transitions of Care Program  Mliss Creed [facility], BSN RN Care Manager/ Transition of Care [place]/ [facility] [phone]

## 2024-01-22 NOTE — Patient Instructions (Signed)
 Visit Information  Thank you for taking time to visit with me today. Please don't hesitate to contact me if I can be of assistance to you before our next scheduled telephone appointment.  Our next appointment is no further scheduled appointments.    Following is a copy of your care plan:   Goals Addressed             This Visit's Progress    COMPLETED: VBCI Transitions of Care (TOC) Care Plan       Problems:  Recent Hospitalization for treatment of right renal kidney stone and UTI, s/p stent placement Pt saw primary care provider 01/01/24 Pt states stent was removed last Friday, pt is feeling much better, still having some frequency with urine Pt is going on vacation next week, declines telephone outreach next week, wants a call the week after 01/15/24-Pt is not monitoring blood pressure consistently, pt has been on vacation, plans to start back checking BP daily and keeping a log, pt states  had one reading of 162/78, pt has been eating more food including bread and donuts 01/22/24- pt reports he is doing well, continues daily weights, checks CBG  on occasion  continues checking BP daily with readings per pt  overall good  Goal:   Over the next 30 days, the patient will not experience hospital readmission  Interventions:  Transitions of Care: Doctor Visits  - discussed the importance of doctor visits Reviewed Signs and symptoms of infection Reviewed importance of taking medications as prescribed Reinforced carbohydrate modified diet, importance of healthy eating Encouraged patient to stay hydrated, preferably water Reinforced self monitoring of daily weights, BP, CBG  Patient Self Care Activities:  Attend all scheduled provider appointments Call pharmacy for medication refills 3-7 days in advance of running out of medications Call provider office for new concerns or questions  Notify RN Care Manager of TOC call rescheduling needs Participate in Transition of Care  Program/Attend TOC scheduled calls Perform all self care activities independently  Take medications as prescribed   Weigh daily - first thing in the morning before you eat or drink anything, keep a log Monitor blood pressure daily, keep a log Monitor blood sugar, keep a log Avoid concentrated sweets in your diet, be mindful of your intake of carbohydrates such as bread, rice, pasta, limit these foods TOC case closure  Plan:  No further follow up required: case closure          Patient verbalizes understanding of instructions and care plan provided today and agrees to view in MyChart. Active MyChart status and patient understanding of how to access instructions and care plan via MyChart confirmed with patient.     No further follow up required: case closure  Please call the care guide team at [phone] if you need to cancel or reschedule your appointment.   Please call the Suicide and Crisis Lifeline: 988 call the [place]  National Suicide Prevention Lifeline: 727-825-0231 or TTY: 8787709824 TTY 551-286-8414) to talk to a trained counselor call 1-800-273-TALK (toll free, 24 hour hotline) go to [facility] [address], [place] [phone]) call 911 if you are experiencing a Mental Health or Behavioral Health Crisis or need someone to talk to.  Mliss Creed [facility], BSN RN Care Manager/ Transition of Care [place]/ [facility] [phone]

## 2024-01-26 DIAGNOSIS — N393 Stress incontinence (female) (male): Secondary | ICD-10-CM | POA: Diagnosis not present

## 2024-01-26 DIAGNOSIS — N202 Calculus of kidney with calculus of ureter: Secondary | ICD-10-CM | POA: Diagnosis not present

## 2024-02-06 ENCOUNTER — Ambulatory Visit: Payer: Self-pay

## 2024-02-06 NOTE — Telephone Encounter (Signed)
 FYI Only or Action Required?: FYI only for provider.  Patient was last seen in primary care on 01/01/2024 by William Butler DASEN, MD.  Called Nurse Triage reporting Headache and Hypertension.  Symptoms began yesterday.  Interventions attempted: OTC medications: Tylenol .  Symptoms are: gradually worsening.  Triage Disposition: See Physician Within 24 Hours  Patient/caregiver understands and will follow disposition?: No, refuses disposition                           Copied from CRM #[id number]. Topic: Clinical - Red Word Triage >> Feb 06, 2024  2:33 PM Dedra B wrote: Red Word that prompted transfer to Nurse Triage: Pt is having really bad headache the past two days that will not ease up. Pt BP is slightly elevated at 150/90. Pt also complaining of being cold and no appetite. Warm transfer to NT. Reason for Disposition  [1] MODERATE headache (e.g., interferes with normal activities) AND [2] present > 24 hours AND [3] unexplained  (Exceptions: Pain medicines not tried, typical migraine, or headache part of viral illness.)  Answer Assessment - Initial Assessment Questions This RN advised in-person evaulatuon today. No availability in PCP office or surrounding offfices within region. Adivsed UC. Patient declined and stated he does not feel up to going to UC. This RN strongly encouraged patient and his wife to monitor symptoms and seek treatment if symptoms worsen over the weekend. Patient and wife verbalized understanding.   1. LOCATION: Where does it hurt?      Far sides of head 2. ONSET: When did the headache start? (e.g., minutes, hours, days)      Yesterday afternoon, eased up, started again this morning  3. PATTERN: Does the pain come and go, or has it been constant since it started?     Comes and goes 4. SEVERITY: How bad is the pain? and What does it keep you from doing?  (e.g., Scale 1-10; mild, moderate, or severe)     Rates pain an 8 5. RECURRENT  SYMPTOM: Have you ever had headaches before? If Yes, ask: When was the last time? and What happened that time?      Denies 6. CAUSE: What do you think is causing the headache?     Unsure, ever since I have had these kidney stones and started new medications, my BP has been running high 7. MIGRAINE: Have you been diagnosed with migraine headaches? If Yes, ask: Is this headache similar?      Denies 8. HEAD INJURY: Has there been any recent injury to your head?      Denies 9. OTHER SYMPTOMS: Do you have any other symptoms? (e.g., fever, stiff neck, eye pain, sore throat, cold symptoms)     BP 150/80 about 30 minutes ago, fever of 100.0, lack of appetite for awhile, diarrhea that comes and goes, denies dizziness, denies chest pain, denies difficulty breathing, denies changes in vision, denies vomiting, denies numbness/weakness, denies cold/flu symptoms, denies sinus congestion, denies sinus pressure, denies speech changes, states he still has full ROM of neck 10. PREGNANCY: Is there any chance you are pregnant? When was your last menstrual period?     N/A  Protocols used: Headache-A-AH

## 2024-02-09 ENCOUNTER — Ambulatory Visit (INDEPENDENT_AMBULATORY_CARE_PROVIDER_SITE_OTHER): Admitting: Family Medicine

## 2024-02-09 VITALS — BP 160/82 | HR 57 | Temp 97.6°F | Ht 66.0 in | Wt 234.0 lb

## 2024-02-09 DIAGNOSIS — I10 Essential (primary) hypertension: Secondary | ICD-10-CM

## 2024-02-09 DIAGNOSIS — G44209 Tension-type headache, unspecified, not intractable: Secondary | ICD-10-CM | POA: Diagnosis not present

## 2024-02-09 MED ORDER — LOSARTAN POTASSIUM-HCTZ 50-12.5 MG PO TABS: 1.0000 | ORAL_TABLET | Freq: Every day | ORAL | 3 refills | Status: AC

## 2024-02-09 MED ORDER — TIZANIDINE HCL 4 MG PO TABS: 4.0000 mg | ORAL_TABLET | Freq: Four times a day (QID) | ORAL | 0 refills | Status: DC | PRN

## 2024-02-09 NOTE — Progress Notes (Signed)
 Subjective:    Patient ID: William Howell, male    DOB: 10-22-1940, 83 y.o.   MRN: [id number]  In September, the patient had pyelonephritis secondary to a kidney stone.  He had acute renal insufficiency.  Since that time, his blood pressure has been elevated.  Starting at the end of last week he developed a constant headache.  Headache is located over his frontal scalp and radiates over the top of his head to his crown.  Has had constant pressure-like headache.  There is no exacerbating or alleviating factors.  Has been present all weekend.  However his headache improved this morning and is better now.  He denies any exacerbation of the headache with Valsalva.  He denies any worsening of the headache with coughing or straining.  His blood pressure at home has consistently been 150-160 systolic.  He denies any chest pain or shortness of breath.  He denies any meningismus.  He denies any sinus pain or rhinorrhea.  He denies any cough or sore throat.  He states that he had a subjective fever over the weekend on 1 occasion but this subsided.  He denies any recent tick bite or rash.  He denies any photophobia or phonophobia.  He denies any neurologic deficits such as facial droop or slurred speech. Past Medical History:  Diagnosis Date   Allergy     Asthma    Diabetes mellitus type II, controlled (HCC)    GERD (gastroesophageal reflux disease)    pt states he takes omeprazole  but doesnt have GERD   Hypertension    Obesity    Sleep apnea    uses CPAP   Past Surgical History:  Procedure Laterality Date   CHOLECYSTECTOMY     CYSTOSCOPY/URETEROSCOPY/HOLMIUM LASER/STENT PLACEMENT Right 12/23/2023   Procedure: CYSTOSCOPY/URETEROSCOPY/HOLMIUM LASER/STENT PLACEMENT;  Surgeon: Alvaro Ricardo KATHEE Mickey., MD;  Location: [facility];  Service: Urology;  Laterality: Right;  Possible stent placement   DENTAL SURGERY     Current Outpatient Medications on File Prior to Visit  Medication Sig Dispense Refill    acetaminophen  (TYLENOL ) 325 MG tablet Take 2 tablets (650 mg total) by mouth every 6 (six) hours as needed for mild pain (pain score 1-3) or fever (or Fever >/= 101).     benralizumab  (FASENRA  PEN) 30 MG/ML prefilled autoinjector Inject 1 mL (30 mg total) into the skin every 8 (eight) weeks. 1 mL 2   Budeson-Glycopyrrol-Formoterol  (BREZTRI  AEROSPHERE) 160-9-4.8 MCG/ACT AERO Inhale 2 puffs into the lungs 2 (two) times daily. 10.7 g 11   cetirizine  (ZYRTEC ) 10 MG tablet Take 1 tablet by mouth once daily 90 tablet 0   cyanocobalamin  (VITAMIN B12) 500 MCG tablet Take 1 tablet (500 mcg total) by mouth daily. 30 tablet 4   ferrous sulfate  325 (65 FE) MG tablet Take 1 tablet (325 mg total) by mouth every Tuesday, Thursday, Saturday, and Sunday. 45 tablet 3   Nintedanib (OFEV ) 100 MG CAPS Take 1 capsule (100 mg total) by mouth 2 (two) times daily. 180 capsule 1   omeprazole  (PRILOSEC) 40 MG capsule Take 1 capsule by mouth once daily 90 capsule 1   tamsulosin  (FLOMAX ) 0.4 MG CAPS capsule Take 1 capsule (0.4 mg total) by mouth daily. 90 capsule 3   VENTOLIN  HFA 108 (90 Base) MCG/ACT inhaler Inhale 2 puffs into the lungs every 6 (six) hours as needed for wheezing or shortness of breath. 18 g 0   zafirlukast  (ACCOLATE ) 20 MG tablet Take 1 tablet (20 mg total) by mouth  2 (two) times daily. 180 tablet 3   senna-docusate (SENOKOT-S) 8.6-50 MG tablet Take 2 tablets by mouth at bedtime. (Patient not taking: Reported on 02/09/2024) 60 tablet 4   No current facility-administered medications on file prior to visit.   No Known Allergies Social History   Socioeconomic History   Marital status: Married    Spouse name: Channing   Number of children: 5   Years of education: Not on file   Highest education level: 12th grade  Occupational History   Not on file  Tobacco Use   Smoking status: Never   Smokeless tobacco: Former    Types: Engineer, Drilling   Vaping status: Never Used  Substance and Sexual Activity    Alcohol use: Yes    Comment: occasional    Drug use: No   Sexual activity: Not Currently  Other Topics Concern   Not on file  Social History Narrative   3 children, 2 stepchildren   12 grandchildren   7 great grandchildren.   Retired Psychologist, Forensic.    Social Drivers of Corporate Investment Banker Strain: Low Risk  (11/19/2023)   Overall Financial Resource Strain (CARDIA)    Difficulty of Paying Living Expenses: Not hard at all  Food Insecurity: No Food Insecurity (12/25/2023)   Hunger Vital Sign    Worried About Running Out of Food in the Last Year: Never true    Ran Out of Food in the Last Year: Never true  Transportation Needs: No Transportation Needs (12/25/2023)   PRAPARE - Administrator, Civil Service (Medical): No    Lack of Transportation (Non-Medical): No  Physical Activity: Sufficiently Active (11/19/2023)   Exercise Vital Sign    Days of Exercise per Week: 6 days    Minutes of Exercise per Session: 60 min  Stress: No Stress Concern Present (11/19/2023)   [employer] of Occupational Health - Occupational Stress Questionnaire    Feeling of Stress: Only a little  Social Connections: Moderately Isolated (12/22/2023)   Social Connection and Isolation Panel    Frequency of Communication with Friends and Family: More than three times a week    Frequency of Social Gatherings with Friends and Family: More than three times a week    Attends Religious Services: Patient declined    Database Administrator or Organizations: No    Attends Banker Meetings: Never    Marital Status: Married  Catering Manager Violence: Not At Risk (12/25/2023)   Humiliation, Afraid, Rape, and Kick questionnaire    Fear of Current or Ex-Partner: No    Emotionally Abused: No    Physically Abused: No    Sexually Abused: No      Review of Systems  All other systems reviewed and are negative.      Objective:   Physical Exam Vitals reviewed.  Constitutional:       General: He is not in acute distress.    Appearance: He is well-developed. He is not diaphoretic.  HENT:     Head: Normocephalic and atraumatic.     Right Ear: Tympanic membrane, ear canal and external ear normal.     Left Ear: Tympanic membrane, ear canal and external ear normal.     Nose: No congestion or rhinorrhea.     Mouth/Throat:     Mouth: Mucous membranes are moist.     Pharynx: Oropharynx is clear. No oropharyngeal exudate or posterior oropharyngeal erythema.  Eyes:  General: No scleral icterus.       Right eye: No discharge.        Left eye: No discharge.     Conjunctiva/sclera: Conjunctivae normal.     Pupils: Pupils are equal, round, and reactive to light.  Neck:     Thyroid: No thyromegaly.     Vascular: No JVD.     Trachea: No tracheal deviation.  Cardiovascular:     Rate and Rhythm: Normal rate and regular rhythm.     Heart sounds: Normal heart sounds. No murmur heard.    No friction rub. No gallop.  Pulmonary:     Effort: Pulmonary effort is normal. No accessory muscle usage, prolonged expiration or respiratory distress.     Breath sounds: No stridor or decreased air movement. No wheezing, rhonchi or rales.  Chest:     Chest wall: No tenderness.  Musculoskeletal:     Cervical back: Normal range of motion and neck supple.  Lymphadenopathy:     Cervical: No cervical adenopathy.  Skin:    General: Skin is warm.     Coloration: Skin is not pale.     Findings: No erythema or rash.  Neurological:     Mental Status: He is alert and oriented to person, place, and time.     Cranial Nerves: No cranial nerve deficit.     Motor: No abnormal muscle tone.     Coordination: Coordination normal.     Deep Tendon Reflexes: Reflexes are normal and symmetric.  Psychiatric:        Behavior: Behavior normal.        Thought Content: Thought content normal.        Judgment: Judgment normal.           Assessment & Plan:  Benign essential HTN - Plan: CBC with  Differential/Platelet, Comprehensive metabolic panel with GFR, losartan -hydrochlorothiazide (HYZAAR) 50-12.5 MG tablet, tiZANidine (ZANAFLEX) 4 MG tablet, Sedimentation rate  Tension headache - Plan: Sedimentation rate Physical exam today is normal.  Blood pressure is not elevated.  I recommended switching losartan  to Hyzaar 50/12.51 p.o. daily.  Recheck blood pressure in 1 week.  Given recent acute renal insufficiency I will check a CMP.  Given the subjective fever over the weekend and the headache, I will check a CBC and a sedimentation rate.  If significantly elevated, consider possible temporal arteritis.  However, history suggest tension headache.  I will treat this with tizanidine 4 mg every 6 hours as needed and also lower his blood pressure.  Recheck in 1 week or sooner if worse.  Patient has no history of migraines

## 2024-02-10 ENCOUNTER — Ambulatory Visit: Payer: Self-pay | Admitting: Family Medicine

## 2024-02-10 LAB — COMPREHENSIVE METABOLIC PANEL WITH GFR
AG Ratio: 1.7 (calc) (ref 1.0–2.5)
ALT: 12 U/L (ref 9–46)
AST: 12 U/L (ref 10–35)
Albumin: 3.9 g/dL (ref 3.6–5.1)
Alkaline phosphatase (APISO): 57 U/L (ref 35–144)
BUN: 15 mg/dL (ref 7–25)
CO2: 33 mmol/L — ABNORMAL HIGH (ref 20–32)
Calcium: 9.1 mg/dL (ref 8.6–10.3)
Chloride: 104 mmol/L (ref 98–110)
Creat: 1.2 mg/dL (ref 0.70–1.22)
Globulin: 2.3 g/dL (ref 1.9–3.7)
Glucose, Bld: 109 mg/dL — ABNORMAL HIGH (ref 65–99)
Potassium: 4.5 mmol/L (ref 3.5–5.3)
Sodium: 142 mmol/L (ref 135–146)
Total Bilirubin: 0.4 mg/dL (ref 0.2–1.2)
Total Protein: 6.2 g/dL (ref 6.1–8.1)
eGFR: 60 mL/min/1.73m2 (ref >=60)

## 2024-02-10 LAB — CBC WITH DIFFERENTIAL/PLATELET
Absolute Lymphocytes: 2470 {cells}/uL (ref 850–3900)
Absolute Monocytes: 669 {cells}/uL (ref 200–950)
Basophils Absolute: 7 {cells}/uL (ref 0–200)
Basophils Relative: 0.1 %
Eosinophils Absolute: 0 {cells}/uL — ABNORMAL LOW (ref 15–500)
Eosinophils Relative: 0 %
HCT: 41.8 % (ref 38.5–50.0)
Hemoglobin: 13.2 g/dL (ref 13.2–17.1)
MCH: 30.1 pg (ref 27.0–33.0)
MCHC: 31.6 g/dL — ABNORMAL LOW (ref 32.0–36.0)
MCV: 95.2 fL (ref 80.0–100.0)
MPV: 10.2 fL (ref 7.5–12.5)
Monocytes Relative: 9.7 %
Neutro Abs: 3754 {cells}/uL (ref 1500–7800)
Neutrophils Relative %: 54.4 %
Platelets: 259 Thousand/uL (ref 140–400)
RBC: 4.39 Million/uL (ref 4.20–5.80)
RDW: 14.5 % (ref 11.0–15.0)
Total Lymphocyte: 35.8 %
WBC: 6.9 Thousand/uL (ref 3.8–10.8)

## 2024-02-10 LAB — SEDIMENTATION RATE: Sed Rate: 9 mm/h (ref 0–20)

## 2024-02-13 ENCOUNTER — Ambulatory Visit: Payer: Self-pay

## 2024-02-13 NOTE — Telephone Encounter (Addendum)
 FYI Only or Action Required?: Action required by provider: update on patient condition.  Patient was last seen in primary care on 02/09/2024 by Duanne Butler DASEN, MD.  Called Nurse Triage reporting Hypertension.  Symptoms began today.  Interventions attempted: Prescription medications: Took blood pressure medication.  Symptoms are: gradually improving.  Triage Disposition: See PCP Within 2 Weeks  Patient/caregiver understands and will follow disposition?: No, wishes to speak with PCP     Copied from CRM #[id number]. Topic: Clinical - Red Word Triage >> Feb 13, 2024 10:27 AM Winona R wrote: Pt was just prescribed medication on the 27th for High blood pressure but he's still experiencing Headaches, BP with medication 170/105 this morning and just now 158/93 Pulse 68       Patient seen 10/27 and placed on blood pressure medication. Patient wanted to make Dr. Duanne aware that he is still experiencing elevated blood pressures.   Reason for Disposition  [1] Systolic BP >= 130 OR Diastolic >= 80 AND [2] taking BP medications  Answer Assessment - Initial Assessment Questions 1. BLOOD PRESSURE: What is your blood pressure? Did you take at least two measurements 5 minutes apart?     170/105 this morning, now 158/93 2. ONSET: When did you take your blood pressure?     Today  3. HOW: How did you take your blood pressure? (e.g., automatic home BP monitor, visiting nurse)     Automatic BP cuff  4. HISTORY: Do you have a history of high blood pressure?     Diagnosed 4 days ago  5. MEDICINES: Are you taking any medicines for blood pressure? Have you missed any doses recently?     Yes, has taken his medication  6. OTHER SYMPTOMS: Do you have any symptoms? (e.g., blurred vision, chest pain, difficulty breathing, headache, weakness)     Mild headache  Protocols used: Blood Pressure - High-A-AH

## 2024-02-17 ENCOUNTER — Ambulatory Visit: Payer: Self-pay

## 2024-02-17 NOTE — Telephone Encounter (Signed)
 FYI Only or Action Required?: FYI only for provider: appointment scheduled on 11/4.  Patient was last seen in primary care on 02/09/2024 by William Butler DASEN, MD.  Called Nurse Triage reporting Headache.  Symptoms began several days ago.  Interventions attempted: OTC medications: tylenol  and Prescription medications: tizanidine.  Symptoms are: unchanged.  Triage Disposition: See Physician Within 24 Hours  Patient/caregiver understands and will follow disposition?: Yes  Copied from CRM #[id number]. Topic: Clinical - Red Word Triage >> Feb 17, 2024 12:10 PM [name] wrote: Red Word that prompted transfer to Nurse Triage: Pt was seen last week for high blood pressure and headaches. One of this bp meds was changed and he was given medication for the headaches. The bp has gotten better but the headaches are still the same. Pt wife seems to think the bp was high due to the headaches. She says he does not have any other symptoms. She says the headaches are at least once a day. He woke up last night due to a headache. She says right now he is laying with his eyes closed due to the headache Reason for Disposition  [1] MODERATE headache (e.g., interferes with normal activities) AND [2] present > 24 hours AND [3] unexplained  (Exceptions: Pain medicines not tried, typical migraine, or headache part of viral illness.)  Answer Assessment - Initial Assessment Questions Patient seen recently for HTN and Headaches. Med adjustment has lowered his BP to wnl. Tizanidine for headaches- helping mildly but still having daily. Patient not prone to headaches. Pain at the top of his head. Denies N/V, neck tension, fever, vision changes, or unilateral weakness. He is laying down, taking tylenol  and tizanidine with mild relief but not fully. Poor appetite. Denies cold or allergy  sx. Did wake in the night with pain. Advised Benadryl  can help with sleep and any congestion that may be worsening.  Yesterday and today's BP's  106/54 hr 66, 107/48 Hr 67  Appt with pcp office 11/4 to assess. ED/UC precautions advised. Wife will Call back with any questions  1. LOCATION: Where does it hurt?      Top of his head  2. ONSET: When did the headache start? (e.g., minutes, hours, days)      2 weeks off and on- more often over last 2-3 days 3. PATTERN: Does the pain come and go, or has it been constant since it started?     Comes and goes 4. SEVERITY: How bad is the pain? and What does it keep you from doing?  (e.g., Scale 1-10; mild, moderate, or severe)     At its worse pain 8/10, after medications 4-5/10 5. RECURRENT SYMPTOM: Have you ever had headaches before? If Yes, ask: When was the last time? and What happened that time?      Just during BP issues 6. CAUSE: What do you think is causing the headache?     unsure 7. MIGRAINE: Have you been diagnosed with migraine headaches? If Yes, ask: Is this headache similar?      denies 8. HEAD INJURY: Has there been any recent injury to your head?      denies 9. OTHER SYMPTOMS: Do you have any other symptoms? (e.g., fever, stiff neck, eye pain, sore throat, cold symptoms)     Denies  Protocols used: Headache-A-AH

## 2024-02-18 ENCOUNTER — Encounter: Payer: Self-pay | Admitting: Family Medicine

## 2024-02-18 ENCOUNTER — Ambulatory Visit: Admitting: Family Medicine

## 2024-02-18 VITALS — BP 137/85 | HR 74 | Temp 98.2°F | Ht 66.0 in | Wt 234.0 lb

## 2024-02-18 DIAGNOSIS — R350 Frequency of micturition: Secondary | ICD-10-CM | POA: Diagnosis not present

## 2024-02-18 DIAGNOSIS — G44209 Tension-type headache, unspecified, not intractable: Secondary | ICD-10-CM

## 2024-02-18 DIAGNOSIS — I10 Essential (primary) hypertension: Secondary | ICD-10-CM | POA: Diagnosis not present

## 2024-02-18 LAB — URINALYSIS, ROUTINE W REFLEX MICROSCOPIC
Bacteria, UA: NONE SEEN /HPF
Bilirubin Urine: NEGATIVE
Glucose, UA: NEGATIVE
Hyaline Cast: NONE SEEN /LPF
Ketones, ur: NEGATIVE
Nitrite: NEGATIVE
Protein, ur: NEGATIVE
Specific Gravity, Urine: 1.015 (ref 1.001–1.035)
pH: 6 (ref 5.0–8.0)

## 2024-02-18 LAB — MICROSCOPIC MESSAGE

## 2024-02-18 NOTE — Progress Notes (Signed)
 Acute Office Visit  Patient ID: William Howell, male    DOB: 04-May-1940, 83 y.o.   MRN: [id number]  PCP: Duanne Butler DASEN, MD  Chief Complaint  Patient presents with   Acute Visit    Headache : feels like severe pressure pushing thru the top of his head x 2 weeks started w/ elevated b/p .  Dr. Duanne changed medication the B/P has lowered but the headaches are continuing      Subjective:     HPI  Discussed the use of AI scribe software for clinical note transcription with the patient, who gave verbal consent to proceed.  History of Present Illness William Howell is an 83 year old male who presents with new-onset headaches.  He has been experiencing new-onset headaches for the past couple of weeks, described as throbbing and sharp, primarily located on the top and sides of his head. They occur at different times of the day without identifiable triggers. Not worse with coughing or sneezing. Relief is sometimes found by sitting in a quiet room or lying down, and occasionally he falls asleep. No sensitivity to light or sound, vision changes, fevers, chills, or body aches. He has been using tizanidine at night for the headaches, which he finds helpful, and occasionally takes acetaminophen .  He has a history of hypertension, previously managed with losartan . Recently, his medication was changed to a combination including losartan . He notes that his blood pressure readings at home have been on the lower side, though they were slightly elevated during today's visit. He mentions a discrepancy between home and office blood pressure readings.  He reports increased frequency of urination, particularly at night, which is unusual for him. No burning sensation during urination. He uses a CPAP machine for sleep apnea but did not use it the previous night, which may have contributed to his poor sleep and frequent awakenings.  His spouse notes that he walks slower than  usual, possibly due to not feeling well or being cautious in cold weather. No confusion, slurred speech, facial droop, or difficulty walking.   Review of Systems  All other systems reviewed and are negative.   Past Medical History:  Diagnosis Date   Allergy     Asthma    Diabetes mellitus type II, controlled (HCC)    GERD (gastroesophageal reflux disease)    pt states he takes omeprazole  but doesnt have GERD   Hypertension    Obesity    Sleep apnea    uses CPAP    Past Surgical History:  Procedure Laterality Date   CHOLECYSTECTOMY     CYSTOSCOPY/URETEROSCOPY/HOLMIUM LASER/STENT PLACEMENT Right 12/23/2023   Procedure: CYSTOSCOPY/URETEROSCOPY/HOLMIUM LASER/STENT PLACEMENT;  Surgeon: Alvaro Ricardo KATHEE Mickey., MD;  Location: [facility];  Service: Urology;  Laterality: Right;  Possible stent placement   DENTAL SURGERY      Current Outpatient Medications on File Prior to Visit  Medication Sig Dispense Refill   acetaminophen  (TYLENOL ) 325 MG tablet Take 2 tablets (650 mg total) by mouth every 6 (six) hours as needed for mild pain (pain score 1-3) or fever (or Fever >/= 101).     benralizumab  (FASENRA  PEN) 30 MG/ML prefilled autoinjector Inject 1 mL (30 mg total) into the skin every 8 (eight) weeks. 1 mL 2   Budeson-Glycopyrrol-Formoterol  (BREZTRI  AEROSPHERE) 160-9-4.8 MCG/ACT AERO Inhale 2 puffs into the lungs 2 (two) times daily. 10.7 g 11   cetirizine  (ZYRTEC ) 10 MG tablet Take 1 tablet by mouth once daily 90 tablet 0  cyanocobalamin  (VITAMIN B12) 500 MCG tablet Take 1 tablet (500 mcg total) by mouth daily. 30 tablet 4   ferrous sulfate  325 (65 FE) MG tablet Take 1 tablet (325 mg total) by mouth every Tuesday, Thursday, Saturday, and Sunday. 45 tablet 3   losartan -hydrochlorothiazide (HYZAAR) 50-12.5 MG tablet Take 1 tablet by mouth daily. 90 tablet 3   Nintedanib (OFEV ) 100 MG CAPS Take 1 capsule (100 mg total) by mouth 2 (two) times daily. 180 capsule 1   omeprazole  (PRILOSEC) 40 MG capsule  Take 1 capsule by mouth once daily 90 capsule 1   tamsulosin  (FLOMAX ) 0.4 MG CAPS capsule Take 1 capsule (0.4 mg total) by mouth daily. 90 capsule 3   tiZANidine (ZANAFLEX) 4 MG tablet Take 1 tablet (4 mg total) by mouth every 6 (six) hours as needed (headache). 30 tablet 0   VENTOLIN  HFA 108 (90 Base) MCG/ACT inhaler Inhale 2 puffs into the lungs every 6 (six) hours as needed for wheezing or shortness of breath. 18 g 0   zafirlukast  (ACCOLATE ) 20 MG tablet Take 1 tablet (20 mg total) by mouth 2 (two) times daily. 180 tablet 3   senna-docusate (SENOKOT-S) 8.6-50 MG tablet Take 2 tablets by mouth at bedtime. (Patient not taking: Reported on 02/18/2024) 60 tablet 4   No current facility-administered medications on file prior to visit.    No Known Allergies     Objective:    BP 137/85   Pulse 74   Temp 98.2 F (36.8 C)   Ht 5' 6 (1.676 m)   Wt 234 lb (106.1 kg)   SpO2 96%   BMI 37.77 kg/m  BP Readings from Last 3 Encounters:  02/18/24 137/85  02/09/24 (!) 160/82  01/22/24 132/74   Wt Readings from Last 3 Encounters:  02/18/24 234 lb (106.1 kg)  02/09/24 234 lb (106.1 kg)  01/22/24 234 lb (106.1 kg)      Physical Exam Vitals and nursing note reviewed.  Constitutional:      Appearance: Normal appearance. He is normal weight.  HENT:     Head: Normocephalic and atraumatic.  Cardiovascular:     Rate and Rhythm: Normal rate and regular rhythm.     Pulses: Normal pulses.     Heart sounds: Normal heart sounds.  Pulmonary:     Effort: Pulmonary effort is normal.     Breath sounds: Normal breath sounds.  Skin:    General: Skin is warm and dry.     Capillary Refill: Capillary refill takes less than 2 seconds.  Neurological:     General: No focal deficit present.     Mental Status: He is alert and oriented to person, place, and time. Mental status is at baseline.     GCS: GCS eye subscore is 4. GCS verbal subscore is 5. GCS motor subscore is 6.     Cranial Nerves: Cranial  nerves 2-12 are intact.     Sensory: Sensation is intact.     Motor: Motor function is intact.     Gait: Gait is intact.  Psychiatric:        Mood and Affect: Mood normal.        Behavior: Behavior normal.        Thought Content: Thought content normal.        Judgment: Judgment normal.       No results found for any visits on 02/18/24.     Assessment & Plan:   Problem List Items Addressed This Visit  Tension headache - Primary   Relevant Orders   MR Brain W Wo Contrast   Urinary frequency   Relevant Orders   Urinalysis, Routine w reflex microscopic   Benign essential HTN    Assessment and Plan Assessment & Plan New daily persistent headache Persistent headache for over a week without red flags. Imaging needed to rule out underlying pathology. - Order MRI of the brain stat. - Discussed potential for incidental findings on MRI.  Frequency of micturition Increased urination frequency, especially nocturia, without dysuria. Cause unclear. - Obtain urine sample for urinalysis. - UA negative  Hypertension BP improved on losartan -hydrochlorothiazide 50-12.5mg  daily - Continue Hyzaar    No orders of the defined types were placed in this encounter.   Return for obtain MRI, follow up with PCP if symptoms persist or worsen.  Jeoffrey GORMAN Barrio, FNP [facility]

## 2024-02-27 ENCOUNTER — Telehealth: Payer: Self-pay | Admitting: Family Medicine

## 2024-02-27 NOTE — Telephone Encounter (Signed)
 Copied from CRM #[id number]. Topic: General - Other >> Feb 27, 2024 12:11 PM Pinkey ORN wrote: Reason for CRM: MRI Approval Clearance

## 2024-02-29 ENCOUNTER — Other Ambulatory Visit: Payer: Self-pay | Admitting: Family Medicine

## 2024-03-02 NOTE — Telephone Encounter (Signed)
 Requested medications are due for refill today.  yes  Requested medications are on the active medications list.  yes  Last refill. 02/11/2023 10.7 11 rf  Future visit scheduled.   yes  Notes to clinic.  Medication not assigned to a protocol. Please review for refill.    Requested Prescriptions  Pending Prescriptions Disp Refills   BREZTRI  AEROSPHERE 160-9-4.8 MCG/ACT AERO inhaler [Pharmacy Med Name: Breztri  Aerosphere 160-9-4.8 MCG/ACT Inhalation Aerosol] 11 g 0    Sig: INHALE 2 PUFFS TWICE DAILY     Off-Protocol Failed - 03/02/2024  1:17 PM      Failed - Medication not assigned to a protocol, review manually.      Passed - Valid encounter within last 12 months    Recent Outpatient Visits           1 week ago Tension headache   [place] [facility] Kayla Jeoffrey RAMAN, FNP   3 weeks ago Benign essential HTN   [place] [facility] Duanne, Butler DASEN, MD   2 months ago Acute pyelonephritis   [place] [facility] Duanne Butler DASEN, MD   2 months ago Prostatitis, unspecified prostatitis type   [place] [facility] Aletha Bene, MD   2 months ago Dysuria   [place] [facility] [name], Butler DASEN, MD

## 2024-03-13 ENCOUNTER — Other Ambulatory Visit: Payer: Self-pay | Admitting: Family Medicine

## 2024-03-16 ENCOUNTER — Telehealth: Payer: Self-pay | Admitting: *Deleted

## 2024-03-16 DIAGNOSIS — D721 Eosinophilia, unspecified: Secondary | ICD-10-CM

## 2024-03-16 DIAGNOSIS — J45991 Cough variant asthma: Secondary | ICD-10-CM

## 2024-03-16 MED ORDER — FASENRA PEN 30 MG/ML ~~LOC~~ SOAJ: 30.0000 mg | SUBCUTANEOUS | 2 refills | Status: DC

## 2024-03-16 NOTE — Telephone Encounter (Signed)
 Copied from CRM #[id number]. Topic: Clinical - Prescription Issue >> Mar 15, 2024  9:17 AM Amery PARAS wrote: Reason for CRM:   Pt was advised he needs new prescription for benralizumab  (FASENRA  PEN) 30 MG/ML prefilled autoinjector sent to [facility], he is due for his next injection on 03/22/2024.   CB#  [phone]

## 2024-03-16 NOTE — Telephone Encounter (Signed)
 Refill sent for FASENRA  to [facility]  Dose: 30mg  [place] every 8 weeks  Last OV: 09/15/23 Provider: Dr. Geronimo  Next OV: 03/26/24  Aleck Puls, PharmD, BCPS Clinical Pharmacist  [facility]

## 2024-03-25 NOTE — Progress Notes (Unsigned)
 OV 05/16/2022 -transfer of care to the ILD center with Dr. Geronimo.    Subjective:  Patient ID: William Howell, male , DOB: Dec 27, 1940 , age 83 y.o. , MRN: [id number] , ADDRESS: [address] [place] [address] PCP Duanne Butler DASEN, MD Patient Care Team: Duanne Butler DASEN, MD as PCP - General (Family Medicine) Mona Vinie BROCKS, MD as PCP - Cardiology (Cardiology) Mona Vinie BROCKS, MD as Consulting Physician (Cardiology)  This Provider for this visit: Treatment Team:  Attending Provider: Geronimo Amel, MD    05/16/2022 -   Chief Complaint  Patient presents with   Consult    Cough,      HPI William Howell 83 y.o. -is maternal grandfather to Dr. Donnice Beals pulmonologist at [facility].  He is transferring his care to Dr. Geronimo [me] because of concern of interstitial lung disease.  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  He does a lot of heavy manual work.  He has significant exposures that because of his farm work.  He has also obesity and has sleep apnea.  He uses CPAP at night previously followed by Dr. MALVA (currently unclear who his new sleep doctor will be].  He reports dyspnea on exertion for at least the last 10-12 years but for the last 1 year it is progressive.  It is relieved by rest.  However since October 2023 has had a cough it initially got better but then November 2023 it came back again.  He recollects being treated for pneumonia.  Around this time in December 2023 a few days before Christmas he did have a CT scan of the chest that I personally visualized it does show right lower lobe density consistent with pneumonia].  Since then after treatment of pneumonia his cough is better but is still not back at baseline.  His shortness of breath is also not back at baseline.  Regarding his cough overall it was worse compared to a year ago although better compared to fall 2023.  He does bring out some  occasional clear to light yellow phlegm.  He does cough at night.  He does also have wheezing with the cough he does clear the throat but there is no hemoptysis.  The cough does not get worse when he lies down.  There is no tickle in his throat.  [place] Integrated Comprehensive ILD Questionnaire  Symptoms:   Past Medical History :  -Obesity -Sleep apnea on CPAP -Review of the external records indicate that in 2019 he was being treated for asthma by Dr. Vicenta Lennert. -Denies any connective tissue disease -Has hypertension Pneumonia right lower lobe December 2023 -He has had both COVID-vaccine and COVID disease but was never hospitalized for COVID.   ROS:  --Fatigue Shortness of breath Chronic cough Some weight loss Does not snore because of CPAP   FAMILY HISTORY of LUNG DISEASE:  -No family history of pulmonary fibrosis but his father did have COPD  PERSONAL EXPOSURE HISTORY:  -Non-smoker.  No marijuana use.  No cocaine use no intravenous drug use  HOME  EXPOSURE and HOBBY DETAILS :  -Lives and is fine to take a farm house single[place]  The home is 39 years old.  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  Since 1970s has been active farming.  He has been exposed to working in [employer].  Is been a daily farmer a lot of chemical exposure.  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  He is also done sewage work.  None animal feed production.  Been a consideration is.  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  Has done foundry work, microbiologist, management consultant, location manager, woodwork presents on 5  working.  Has been exposed to pesticides.  PULMONARY TOXICITY HISTORY (27 items):  -He has been on and off prednisone  for years but otherwise detailed pulmonary drug toxicity is negative  INVESTIGATIONS: -Last pulmonary function test in 2017 with reduced FVC but normal DLCO.  This is consistent with obesity. -CT chest without contrast December 2023:  -He does have right lower lobe pneumonia  -He does have ILD that I suspect is in a pattern not consistent with UIP/alternative to UIP pattern  -Onset: In 2017 CT scan of the chest he did not seem to have it v GGO/Air trapping  However 2022 he has some ILD changes. -   CT Chest data - CTWO contrast 04/04/22  Narrative & Impression  CLINICAL DATA:  Follow-up lung nodule seen on radiographs   EXAM: CT CHEST WITHOUT CONTRAST   TECHNIQUE: Multidetector CT imaging of the chest was performed following the standard protocol without IV contrast.   RADIATION DOSE REDUCTION: This exam was performed according to the departmental dose-optimization program which includes automated exposure control, adjustment of the mA and/or kV according to patient size and/or use of iterative reconstruction technique.   COMPARISON:  Radiographs 03/19/2022 and CTA chest 09/28/2021   FINDINGS: Cardiovascular: Normal heart size. No pericardial effusion. Aortic atherosclerotic calcification. The ascending aorta measures approximately 44 mm in diameter (coronal image 86) this previously measured 41 mm on CTA 09/28/2021 though direct comparison is limited by lack of IV contrast.   Mediastinum/Nodes: Increased size of multiple mediastinal lymph nodes. For example a right paratracheal node measures 1.0 cm (3/33), previously 0.6 cm. Unremarkable esophagus.   Lungs/Pleura: Patchy reticulonodular opacities and interstitial thickening are slightly progressed from 09/28/2021. Increased bronchial wall thickening and bronchiectasis/bronchiolectasis greatest in the  right lower lobe. Focal atelectasis/consolidation in the posterior right lower lobe. No pleural effusion or pneumothorax. No CT correlate for nodule seen on radiographs 03/19/2022.   Upper Abdomen: Cholecystectomy. Colonic diverticulosis without diverticulitis. No acute abnormality   Musculoskeletal: No chest wall mass or suspicious bone lesions identified.   IMPRESSION: 1. Pulmonary findings compatible with bronchitis/bronchiolitis with right lower lobe pneumonia. 2. Similar to slight progression of interstitial lung disease compared with 09/28/2021. 3. Ascending aortic aneurysm measures 44 mm in diameter on today's exam. This may be slightly increased from 09/28/2021 however comparison is difficult given differences in obliquity and lack of IV contrast. Continued annual imaging follow-up with CTA or MRA is recommended.     Electronically Signed   By: Norman Gatlin M.D.   On: 04/08/2022 00:02     OV 06/28/2022  Subjective:  Patient ID: William Howell, male , DOB: [birth year] , age 62 y.o. , MRN: [id number] , ADDRESS: [address] [place] [address] PCP Duanne Butler DASEN, MD Patient Care Team: Duanne Butler DASEN, MD as PCP - General (Family Medicine) Mona Vinie BROCKS, MD as PCP -  Cardiology (Cardiology) Mona Vinie BROCKS, MD as Consulting Physician (Cardiology)  This Provider for this visit: Treatment Team:  Attending Provider: Geronimo Amel, MD  ILD workup in progress  06/28/2022 -   Chief Complaint  Patient presents with   Follow-up    COVID 05/28/2022.  Cough with yellow mucus.  SOB with exertion.   ILD workup in pgoress.  HPI SEYMOUR PAVLAK 83 y.o. -here with wife to review CT, PFT and serology. Says In interim had dental extraction and after that has had dimnished PO and lost 27#. Now a week ago started Ozempoc. HE is hoping to lose more weight but is already having some diarrhea./ Also mid feb 2024 had a bout of covid that was mild though is  having some small amout of cough with yellow sputum .  His PFTs show mild decline in FVC and DLCO in 7 year. . Visualization of images - I Agree is features are NOT cw UIP, Agree is more c/w HP or NSIP. Agree is mild. However, I am not sure of radiologic progression.   We decissused that biopsy is indicated but I did recommend that risk for SLB is > benefit. Did indicate at some point we can consider bronch biopy but at this point was to sort out if there is progerssion on CT . PFTs ae suggesting progression. On radiology repot (Dec 2023 suggested progression) but the current 2024 reporrt is equivocal. If there is progression, would recommend anti-fibrotic but did indicate to him tht markers of ILD for him are indicative of mild burden and likely only very slow progression. 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 Exposed to chemicals, mulch, fertilizer. First HRCT 2017, Some other CTs in 2022 and 2023. He has dyspnea on exertion x 12  years but progressive x 1 year.  End of 2023 got Rx for PNA>  but this could be all obesity. His PFTs 2017 -> 2023 show decline 2.86L/70% -> 2.14L/55%. DLCO: has gone from 26.9 -> 19.9 in same period but both listed as normal > 80%. Need to figure out if he should go an anti-fibrotic so need to see CT and see if he has progressive phenotype on ILD because symptoms could be from obesity  and also because overall burden of ILD is mild.   - Date or time period of scan:  HRCT: 06/12/2022 HRCT: 01/15/2016 - RAST allergy  neg 2017  - Discussion synopsis:  Feb 2024: ILD + , Upper lobe pednomnance, Primary feature is  ground glass, There is volume loss and archietctural distorion in Upper Lobe.   - What is the final conclusion per 2018 ATS/Fleischner Criteria - Alternative Diagnosis. Dx #1 is Chronic HP.  and worse since 2017 slowly. Also, in current FEb 2024 there is some acue process with nodularity - cocerconsuggestive of acute atypical process such as MAC   - Concordance with official report: DISCORDANT  Pathology discussion of biopsy: No lung biopsy   MDD Impression/Recs:   - Rx as HP - steroid can be problematic. Rx as progressive phentopye with anti-fibrotic. Also do bronc with BAL but no need for  TBbx      OV 08/20/2022  Subjective:  Patient ID: William Howell, male , DOB: 11-Sep-1940 , age 13 y.o. , MRN: [id number] , ADDRESS: [address] [place] [address] PCP Duanne Butler DASEN, MD Patient Care Team: Duanne Butler DASEN, MD as PCP - General (Family Medicine) Mona Vinie BROCKS, MD as PCP - Cardiology (Cardiology) Mona Vinie BROCKS, MD as Consulting Physician (Cardiology)  This Provider for this visit: Treatment Team:  Attending Provider: Geronimo Amel, MD    08/20/2022 -   Chief Complaint  Patient presents with   Follow-up    F/up  HPI William Howell 83 y.o. -returns for follow-up.  He says after the last visit and treating with antibiotic and prednisone  his cough did improve but is still there.  He does not wake up because of the cough at night but it does wake his wife up.  He says it is a little bit better but he does still have yellow phlegm.  Review of the labs indicate in 2017 RAST allergy  panel was normal.  His blood eosinophils are always elevated.  His exam nitric oxide  is slightly up.  His MAR shows that he is supposed to be taking Symbicort  but he does not recollect taking it.  MAR also shows him to be taking zafirlukast  but he is not sure if he is taking it.  I asked him to restart his Symbicort .  Regarding his ILD: We discussed with the case conference and the  consensus was that the overall disease burden is mild but it is definitely progressive over many years.  The thought process is that he has hypersensitive pneumonitis.  Steroids was deemed risk particularly over the long-term because of his multiple comorbidities, obesity and his age.  Surgical lung biopsy also would be risky.  Bronchoscopy could be considered an antifibrotic could be considered.  Last visit I thought you should go with pirfenidone because he was on Ozempic  and he was having some diarrhea.  However now he is off Ozempic  because of the side effects.  He did lose weight.  He feels he is gaining weight without the Ozempic .  He will talk to his primary care about other drugs.  We discussed the consensus in the conference that antifibrotic should be indicated for him even though the disease burden is very mild because it is progressive slowly.  We discussed pirfenidone versus nintedanib.  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  We did talk about the diarrhea risk and also extremely high cardiac risk.  Overall shared decision making to take nintedanib.  Decided to start with a low-dose protocol and then slowly escalate.  Also discussed avoidance measures for mold.  He states he is to be exposed heavily to hay and mold and chemicals and fertilizer.  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  I advised him to be diligent about avoiding these exposures.  He is in agreement.   09/30/2022- Interim hx  Patient presents today for 6 week follow-up/new OFEV  start. He was started on low dose Nintedanib 100mg  twice daily in early May. He has progressive ILD, symptom burden is mild. 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   Breathing is baseline, no changes. He has no acute complaints today. He has not been coughing like he was. FENO was elevated at previous visit indicating allergic component contributing to cough. He has used Symbicort  a couple of times  but does  not require daily.     OV 11/25/2022  Subjective:  Patient ID: William Howell, male , DOB: 03/16/1941 , age 24 y.o. , MRN: [id number] , ADDRESS: [address] [place] [address] PCP Duanne Butler DASEN, MD Patient Care Team: Duanne Butler DASEN, MD as PCP - General (Family Medicine) Mona Vinie BROCKS, MD as PCP - Cardiology (Cardiology) Mona Vinie BROCKS, MD as Consulting Physician (Cardiology)  This Provider for this visit: Treatment Team:  Attending Provider: Geronimo Amel, MD   11/25/2022 -   Chief Complaint  Patient presents with   Follow-up    F/up on PFT, BP high since starting ofev , diarrhea, and weight gain more the 2 lbs.      HPI CHASTIN RIESGO 83 y.o. -presents for follow-up.  He is here with his wife who is an independent historian.  He had to go to the beach and so he delayed starting his nintedanib.  Has been taking it for 2 weeks now on the low-dose protocol 100 mg twice daily.  He had diarrhea initially but this resolved.  He is concerned that his unintended might be increasing his blood pressure.  Last night was 170 systolic.  This morning after he took his losartan  and after the morning dose nintedanib here in our office it is normal.  However he does not check his night blood pressure on a consistent basis.  He checked it randomly.  We do not know if prenintedanib his night blood pressure was going up because he never checked it.  He also uses a wrist cuff monitor.  Therefore I shared with him the some uncertainty whether his nintedanib is really causing hypertension although I did admit that nintedanib can increase the blood pressure but it is an uncommon side effect.  Currently is not having any cough.  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  He quit taking his Symbicort  for no particular reason.  I recommended he restart it.  And also use a mask with his farming work.  He had pulmonary function test today and is stable.  Symptom scores are  stable.      OV 01/21/2023  Subjective:  Patient ID: William Howell, male , DOB: [birth year] , age 64 y.o. , MRN: [id number] , ADDRESS: [address] [place] [address] PCP Duanne Butler DASEN, MD Patient Care Team: Duanne Butler DASEN, MD as PCP - General (Family Medicine) Mona Vinie BROCKS, MD as PCP - Cardiology (Cardiology) Mona Vinie BROCKS, MD as Consulting Physician (Cardiology)  This Provider for this visit: Treatment Team:  Attending Provider: Geronimo Amel, MD    01/21/2023 -   Chief Complaint  Patient presents with   Follow-up    Pt states he has not been well the last few days, cough, wheezing. Pt has not been using his albuterol  inhaler or symbicort .    -Hypersensitivity progressive phenotype  -Wheezing phenotype not taking his Symbicort .  High blood eosinophils but normal RAST panel  - Nintedanib/Ofev  requires intensive drug monitoring due to high concerns for Adverse effects of , including  Drug Induced Liver Injury, significant GI side effects that include but not limited to Diarrhea, Nausea, Vomiting,  and other system side effects that include Fatigue,  weight loss. Cardiac side effects are a black box warning as well. These will be monitored with  blood work such as LFT initially once a month for 6 months and then quarterly   HPI Kenyen Candy Furr 82 y.o. -follow-up hypersensitive pneumonitis with asthma  phenotype overlap.  He is here with his wife she is an independent historian.  He continues on nintedanib but he is having significant side effects.  He says initially they came on but resolved but for the last few weeks the back.  For the last few days he is only taking 100 mg once daily.  The symptoms are decreased appetite decreased p.o. intake decreased taste increased diarrhea and also flatulence.  He did pulmonary function test today and is stable through the course of this year.  In addition for the last 5 days is reporting increased cough increase  sputum.  Change in color of sputum to yellow.  He is increased his farming activities for the fall.  He does unique technque Education Administrator crop planting.  This increases the nitrogen content of the soil.  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  He says he is using a cover tractor.  There are no sick contacts.  The weather is also changes no fall in this range.  But it is unclear why he is having the symptoms.  He is got active wheezing today wife is really concerned about this he is also concerned about this.  He is not taking his Symbicort .  This because it was expensive.  Will try to get another inhaler for him.  He does have some samples of Breztri .    OV 04/21/2023  Subjective:  Patient ID: William Howell, male , DOB: April 14, 1941 , age 63 y.o. , MRN: [id number] , ADDRESS: [address] [place] [address] PCP Duanne Butler DASEN, MD Patient Care Team: Duanne Butler DASEN, MD as PCP - General (Family Medicine) Mona Vinie BROCKS, MD as PCP - Cardiology (Cardiology) Mona Vinie BROCKS, MD as Consulting Physician (Cardiology)  This Provider for this visit: Treatment Team:  Attending Provider: Geronimo Amel, MD  24  04/21/2023 -   Chief Complaint  Patient presents with   Follow-up    Getting over a cold. He states today his breathing stable.    Type of visit: Video Virtual Visit Identification of patient JEWELL RYANS with Nov 20, 1940 and MRN [id number] - 2 person identifier Risks: Risks, benefits, limitations of telephone visit explained. Patient understood and verbalized agreement to proceed Anyone else on call: just wife Patient location: his home This provider location: [address]; [place]; [place] [address]. [facility]. [phone]   HPI JAKING THAYER 83 y.o. -at last visit he was in a wheezing exacerbation.  Z-Pak and prednisone  was called in.  We checked his blood eosinophils and it was high.  Therefore I  recommended Dupixent or another biologic.  However, I do not see any evidence of further follow-up.  He did get some Levaquin  in mid December for 2024 through primary care physician based on chart review.  He also had a cough at that time and was given Hycodan cough syrup.  His blood eosinophils have been quite high between 500 and 600 cells per cubic millimeter in the year 2024 as recently as October 2024.  Currently is doing well except that he is having nausea with nintedanib.  He tells me that after some in October 2024 he gave her 2-week holiday with the nintedanib and then restarted the low-dose 100 mg twice daily but he is only taking it at once daily.  Even with this is some mild nausea for 30 minutes and he is having some mild diarrhea.  He is  willing to rechallenge himself to twice daily.  We talked about taking Zofran  and he is agreeable.  I sent in the prescription.  Also talked about his high eosinophil load and recommended biologic.  We have settled on Fasenra  because of the schedule.  Have sent a message to our pharmacy team.  His wife is also part of the conversation.  Although she was not an independent historian today.  He has upcoming CT chest in February but no office visit.     OV 09/15/2023  Subjective:  Patient ID: William MARLA Howell, male , DOB: 06-21-1940 , age 68 y.o. , MRN: [id number] , ADDRESS: [address] [place] [address] PCP Duanne Butler DASEN, MD Patient Care Team: Duanne Butler DASEN, MD as PCP - General (Family Medicine) Mona Vinie BROCKS, MD as PCP - Cardiology (Cardiology) Mona Vinie BROCKS, MD as Consulting Physician (Cardiology)  This Provider for this visit: Treatment Team:  Attending Provider: Geronimo Amel, MD    09/15/2023 -   Chief Complaint  Patient presents with   Interstitial Lung Disease   HPI NIKOLAUS PIENTA 83 y.o. -returns for follow-up.  Presents with his wife.  He is now on Fasenra  since I last saw him and with this the cough is  nearly resolved.  He is pretty happy about that.  In terms of his dyspnea on exertion it is stable.  See symptom score below.  He is unable to tolerate his nintedanib even at the low-dose.  He is taking 1 tablet a day on alternate days and twice daily on alternate days.  This because of diarrhea.  Does not much of nausea.  We discussed the role of CAROB flour and controlling diarrhea and he is willing to try this.  This is to help with compliance.  If this does not work hopefully in the next few months new medication Nerandomilast might be approved.  And he is willing to consider that.  In terms of disease progression he had a high-resolution CT chest that I personally visualized and at least in recent times it seems more organized and stable.  Although over time he has progressed.  He had liver function test earlier this month and I personally visualized it and it is normal.  CT Chest data from date: feb 2024  - personally visualized and independently interpreted : yes - my findings are: agree   IMPRESSION: 1. Pulmonary parenchymal pattern of interstitial lung disease, as detailed above, possibly more organized than on 09/20/2022 and progressive from 09/28/2020. 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 Findings are suggestive of an alternative diagnosis (not UIP) per consensus guidelines: Diagnosis of Idiopathic Pulmonary Fibrosis: An Official ATS/ERS/JRS/ALAT Clinical Practice Guideline. Am JINNY [employer] Crit Care Med Vol 198, Iss 5, [id number], Dec 14 2016. 2. 4.4 cm ascending aortic aneurysm, stable. Recommend annual imaging followup by CTA or MRA. This recommendation follows 2010 ACCF/AHA/AATS/ACR/ASA/SCA/SCAI/SIR/STS/SVM Guidelines for the Diagnosis and Management of Patients with Thoracic Aortic Disease. Circulation. 2010; 121: Z733-z630. Aortic aneurysm NOS (ICD10-I71.9). 3. Tiny left renal stones. 4.  Aortic atherosclerosis (ICD10-I70.0).     Electronically  Signed   By: Newell Eke M.D.   On: 06/09/2023 08:20   PFT  OV 03/26/2024  Subjective:  Patient ID: William Howell, male , DOB: 1940/11/26 , age 64 y.o. , MRN: [id number] , ADDRESS: [address] [place] [address] PCP Duanne Butler DASEN, MD Patient Care Team: Duanne Butler DASEN, MD as PCP - General (Family Medicine) Mona Vinie BROCKS, MD  as PCP - Cardiology (Cardiology) Mona Vinie BROCKS, MD as Consulting Physician (Cardiology) Geronimo Amel, MD as Consulting Physician (Pulmonary Disease) Charmayne Molly, MD as Consulting Physician (Ophthalmology)  This Provider for this visit: Treatment Team:  Attending Provider: Geronimo Amel, MD    ILD workup in progress: Post multidisciplinary case conference diagnosis hypersensitive pneumonitis from farming  -Disease burden is mild  Also chronic cough  -Normal RAST allergy  testing 2017  -Elevated nitric oxide  May 2024  - Elevated eosinophils - 500 cells in feb 2024, 600 cells Oct 2024  - STRT FASENRA  early sprin 2025 with good reflief    Morbid obesity  -Previously on Ozempic  and stopped because of side effects;   - 272# in May 2024  - 232# 03/26/2024     #Nintedanib/Ofev  requires intensive drug monitoring due to high concerns for Adverse effects of , including  Drug Induced Liver Injury, significant GI side effects that include but not limited to Diarrhea, Nausea, Vomiting,  and other system side effects that include Fatigue,  weight loss. Cardiac side effects are a black box warning as well. These will be monitored with  blood work such as LFT initially once a month for 6 months and then quarterly     03/26/2024 -   Chief Complaint  Patient presents with   Interstitial Lung Disease    Pt states since LOV breathing has been pretty good SOB occurs w/ exertion      HPI Adalberto Metzgar N'Ambjwu 83 y.o. 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 Wife Channing preent and is independent historian  Airway: He has been receiving  fASENRA , and he reports that his breathing is pretty good and he has not had much cough. His prescription for these injections ran out, and he missed the dose due on December 8th. His last injection was two months ago. Feels he has had dramatic improvements with this.  I met with Pharmacy tech to sort his refills out because record review shows it is approved  Chronic HP/ID:  He is currently taking Ofev  low  twice daily, which causes some diarrhea, though not significant. Recently, he has experienced increased gas, which he attributes to dietary choices like onions and cabbage. He tried carob powder to alleviate this but discontinued due to disliking the taste. Last PFT was a year ago.   Other medical issues - Headaches He experienced severe headaches following kidney stone surgery, lasting two to three days. These headaches were unprecedented for him and resolved spontaneously. He is fine now  - Sept 2025: had kidney stone removal - Obesity: He has been losing weight, which he attributes to his medication regimen, specifically Ofev . He notes significant weight loss, requiring new clothes, and his current weight is approximately 236 pounds.      SYMPTOM SCALE - ILD 05/16/2022 06/28/2022   08/20/2022 272# 09/30/2022  11/25/2022  01/21/2023 ofev  09/15/2023 Ofev  irregular + fasernal since spring 2025 03/26/2024 233#  Current weight             O2 use ra ra ra RA  ra ra ra   Shortness of Breath 0 -> 5 scale with 5 being worst (score 6 If unable to do)       2    At rest 1 1 3  0 2 2 3    Simple tasks - showers, clothes  change, eating, shaving 2 2 3 3 3 2 3    Household (dishes, doing bed, laundry) 4 3 3 3 4 3 4    Shopping 3 3 4 3 3 3 4    Walking level at own pace 4 3 4  3 3 3 3    Walking up Stairs 4 3 4 4 4 4 4    Total (30-36) Dyspnea Score 11 15 21 16 19 17 21       Non-dyspnea symptoms (0-> 5 scale) 05/16/2022 06/28/2022  08/20/2022  11/25/2022  01/21/2023  09/15/2023   How bad is your cough? 3 2 3 2 3 2   How bad is your fatigue 4 3 - Rx predn/abx 3 3 2 3   How bad is nausea 0 0 0 2 0 2  How bad is vomiting?  0 0 0 0 0 1  How bad is diarrhea? 0 0 - mild after ozempic  start a wekk aog 0 1 3 3   How bad is anxiety? 1 0 0  0 2  How bad is depression 1 0 0  0 2  Any chronic pain - if so where and how bad 1 0 0          SIT STAND TEST - goal 15 times   09/15/2023  03/26/2024   O2 used ra ra  PRobe - finter or forehead finger finger  Number sit and stand completed - goal 15 15 15   Time taken to complete Did not measure 37.82 sec  Resting Pulse Ox/HR/Dyspnea  97% and 63/min and dyspnea of 0/10  96% and HR 71 and score 1  Peak measures 98 % and 96/min and dyspnea of 2/10 99% and HR 101, score 4.5  Final Pulse Ox/HR 98% and 70/min and dyspnea of 3/10 97% and HR 85 and score 3  Desaturated </= 88% no n0  Desaturated <= 3% points no no  Got Tachycardic >/= 90/min yes yes  Miscellaneous comments Nrmal pace similr     PFT     Latest Ref Rng & Units 01/21/2023   11:30 AM 11/20/2022    8:57 AM 06/28/2022    9:00 AM 12/12/2015   10:53 AM  PFT Results  FVC-Pre L 2.32  2.40  2.14  2.86   FVC-Predicted Pre % 71  63  55  70   FVC-Post L   2.20    FVC-Predicted Post %   57    Pre FEV1/FVC % % 76  86  77  80   Post FEV1/FCV % %   85    FEV1-Pre L 1.76  2.08  1.64  2.28   FEV1-Predicted Pre % 77  78  60  77   FEV1-Post L   1.86    DLCO uncorrected ml/min/mmHg 20.39  22.11  19.66  26.92   DLCO UNC% % 96  94  83  86   DLCO corrected ml/min/mmHg 20.39  22.11  20.45    DLCO COR %Predicted % 96  94  86    DLVA Predicted % 147  135  131  122   TLC L   4.38  5.07   TLC % Predicted %   63  74   RV % Predicted %   70  73        LAB RESULTS last 96  hours No results found.       has a past medical history of Allergy , Asthma, Diabetes mellitus type II, controlled (HCC), GERD (gastroesophageal reflux disease), Hypertension,  Obesity, and Sleep apnea.   reports that he has never smoked. He has quit using smokeless tobacco.  His smokeless tobacco use included chew.  Past Surgical History:  Procedure Laterality Date   CHOLECYSTECTOMY     CYSTOSCOPY/URETEROSCOPY/HOLMIUM LASER/STENT PLACEMENT Right 12/23/2023   Procedure: CYSTOSCOPY/URETEROSCOPY/HOLMIUM LASER/STENT PLACEMENT;  Surgeon: Alvaro Ricardo KATHEE Mickey., MD;  Location: [facility];  Service: Urology;  Laterality: Right;  Possible stent placement   DENTAL SURGERY      Allergies[1]  Immunization History  Administered Date(s) Administered   Fluad Quad(high Dose 65+) 01/04/2019, 03/27/2020, 04/22/2022, 01/08/2024   Fluad Trivalent(High Dose 65+) 02/11/2023   INFLUENZA, HIGH DOSE SEASONAL PF 02/17/2017   Influenza,inj,Quad PF,6+ Mos 02/10/2014, 03/14/2015, 01/09/2016, 02/12/2018   Influenza-Unspecified 02/12/2018   PFIZER(Purple Top)SARS-COV-2 Vaccination 07/10/2019, 08/04/2019   Pneumococcal Conjugate-13 07/04/2014   Pneumococcal Polysaccharide-23 12/26/2011   Td 12/26/2011   Tdap 12/26/2011, 08/15/2022   Zoster Recombinant(Shingrix) 02/20/2021, 04/18/2021   Zoster, Live 02/10/2014    Family History  Problem Relation Age of Onset   Heart disease Father    Heart attack Father    Heart attack Sister    Colon cancer Neg Hx    Colon polyps Neg Hx    Esophageal cancer Neg Hx    Rectal cancer Neg Hx    Stomach cancer Neg Hx    Pancreatic cancer Neg Hx    Liver cancer Neg Hx    Prostate cancer Neg Hx     Current Medications[2]      Objective:   Vitals:   03/26/24 0934  BP: 126/70  Pulse: 79  Temp: 97.6 F (36.4 C)  TempSrc: Oral  SpO2: 98%  Weight: 233 lb (105.7 kg)  Height: 5' 7 (1.702 m)    Estimated body mass index is 36.49 kg/m as calculated from the  following:   Height as of this encounter: 5' 7 (1.702 m).   Weight as of this encounter: 233 lb (105.7 kg).  @WEIGHTCHANGE @  Filed Weights   03/26/24 0934  Weight: 233 lb (105.7 kg)     Physical Exam   General: No distress.looks well O2 at rest: no Cane present: no Sitting in wheel chair: no Frail: no Obese: YES Neuro: Alert and Oriented x 3. GCS 15. Speech normal Psych: Pleasant Resp:  Barrel Chest - no.  Wheeze - no, Crackles - no, No overt respiratory distress CVS: Normal heart sounds. Murmurs - no Ext: Stigmata of Connective Tissue Disease - no HEENT: Normal upper airway. PEERL +. 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 # Therapeutic drug monitoring # Nausea and diarrhea due to drug  - ILD is clinically stable -Symptoms are also stable -Currently on nintedanib 100 mg once daily but still with some  diarrhea - normal LFT Nov 2025  Plan -Continue   nintedanib to 100 mg twice  -  = but switch to NERANDILOMLAST (asked pharmacist to apply) - LFT in 3 months   Cough variant asthma Associated eosinophilia  - Muych better with fasenra . No wheezing and no cough  - my records indicate fasenra  has been re-approved  Plan - continue Fasenra  - CMA to get you to meet with pharmacy tech 03/26/2024 before yu go  AT risk respiratory infection  Plan  - Avoid respiratory illness sick exposure and control your risk for respiratory infection  - be uptodate with all respiratory vaccines  - avoid sick contacts especially in areas of indoor clusters (churches, weddings, funerals, family gatherings, birthdays, planes, malls, indoor areas especially)   - mask in  these areas   - discourage sick people from coming in close contact with you  Weight   - 272# in May 2024 - 232# 03/26/2024  PLAN - keep up with weight loss     Follow-up -4 months af ter PFT; 15-minute visit    FOLLOWUP    Return for -4 months af ter PFT; 15-minute visit.    SIGNATURE    Dr. Dorethia Cave, M.D., F.C.C.P,  Pulmonary and Critical Care Medicine Staff Physician, [facility] Center Director - Interstitial Lung Disease  Program  Pulmonary Fibrosis [facility] at [facility] [place], [place], [address]  Pager: [phone], If no answer or between  15:00h - 7:00h: call [phone] Telephone: [phone]  10:09 AM 03/26/2024   Moderate Complexity MDM OFFICE  2021 E/M guidelines, first released in 2021, with minor revisions added in 2023 and 2024 Must meet the requirements for 2 out of 3 dimensions to qualify.    Number and complexity of problems addressed Amount and/or complexity of data reviewed Risk of complications and/or morbidity  One or more chronic illness with mild exacerbation, OR progression, OR  side effects of treatment  Two or more stable chronic illnesses  One undiagnosed new problem with uncertain prognosis  One acute illness with systemic symptoms   One Acute complicated injury Must meet the requirements for 1 of 3 of the categories)  Category 1: Tests and documents, historian  Any combination of 3 of the following:  Assessment requiring an independent historian  Review of prior external note(s) from each unique source  Review of results of each unique test  Ordering of each unique test    Category 2: Interpretation of tests   Independent interpretation of a test performed by another physician/other qualified health care professional (not separately reported)  Category 3: Discuss management/tests  Discussion of management or test interpretation with external physician/other  qualified health care professional/appropriate source (not separately reported) Moderate risk of morbidity from additional diagnostic testing or treatment Examples only:  Prescription drug management  Decision regarding minor surgery with identfied patient or procedure risk factors  Decision regarding elective major surgery without identified patient or procedure risk factors  Diagnosis or treatment significantly limited by social determinants of health             HIGh Complexity  OFFICE   2021 E/M guidelines, first released in 2021, with minor revisions added in 2023. Must meet the requirements for 2 out of 3 dimensions to qualify.    Number and complexity of problems addressed Amount and/or complexity of data reviewed Risk of complications and/or morbidity  Severe exacerbation of chronic illness  Acute or chronic illnesses that may pose a threat to life or bodily function, e.g., multiple trauma, acute MI, pulmonary embolus, severe respiratory distress, progressive rheumatoid arthritis, psychiatric illness with potential threat to self or others, peritonitis, acute renal failure, abrupt change in neurological status Must meet the requirements for 2 of 3 of the categories)  Category 1: Tests and documents, historian  Any combination of 3 of the following:  Assessment requiring an independent historian  Review of prior  external note(s) from each unique source  Review of results of each unique test  Ordering of each unique test    Category 2: Interpretation of tests    Independent interpretation of a test performed by another physician/other qualified health care professional (not separately reported)  Category 3: Discuss management/tests  Discussion of management or test interpretation with external physician/other qualified health care professional/appropriate source (not separately reported)  HIGH risk of morbidity from additional diagnostic testing or  treatment Examples only:  Drug therapy requiring intensive monitoring for toxicity  Decision for elective major surgery with identified pateint or procedure risk factors  Decision regarding hospitalization or escalation of level of care  Decision for DNR or to de-escalate care   Parenteral controlled  substances            LEGEND - Independent interpretation involves the interpretation of a test for which there is a CPT code, and an interpretation or report is customary. When a review and interpretation of a test is performed and documented by the provider, but not separately reported (billed), then this would represent an independent interpretation. This report does not need to conform to the usual standards of a complete report of the test. This does not include interpretation of tests that do not have formal reports such as a complete blood count with differential and blood cultures. Examples would include reviewing a chest radiograph and documenting in the medical record an interpretation, but not separately reporting (billing) the interpretation of the chest radiograph.   An appropriate source includes professionals who are not health care professionals but may be involved in the management of the patient, such as a clinical research associate, upper officer, case manager or teacher, and does not include discussion with family or informal caregivers.    - SDOH: SDOH are the conditions in the environments where people are born, live, learn, work, play, worship, and age that affect a wide range of health, functioning, and quality-of-life outcomes and risks. (e.g., housing, food insecurity, transportation, etc.). SDOH-related Z codes ranging from Z55-Z65 are the ICD-10-CM diagnosis codes used to document SDOH data Z55 - Problems related to education and literacy Z56 - Problems related to employment and unemployment Z57 - Occupational exposure to risk factors Z58 - Problems related to physical  environment Z59 - Problems related to housing and economic circumstances [phone] - Problems related to social environment [phone] - Problems related to upbringing [phone] - Other problems related to primary support group, including family circumstances Z74 - Problems related to certain psychosocial circumstances Z65 - Problems related to other psychosocial circumstances     [1] No Known Allergies [2]  Current Outpatient Medications:    acetaminophen  (TYLENOL ) 325 MG tablet, Take 2 tablets (650 mg total) by mouth every 6 (six) hours as needed for mild pain (pain score 1-3) or fever (or Fever >/= 101)., Disp: , Rfl:    BREZTRI  AEROSPHERE 160-9-4.8 MCG/ACT AERO inhaler, INHALE 2 PUFFS TWICE DAILY, Disp: 11 g, Rfl: 0   cyanocobalamin  (VITAMIN B12) 500 MCG tablet, Take 1 tablet (500 mcg total) by mouth daily., Disp: 30 tablet, Rfl: 4   EQ ALLERGY  RELIEF, CETIRIZINE , 10 MG tablet, Take 1 tablet by mouth once daily, Disp: 90 tablet, Rfl: 0   ferrous sulfate  325 (65 FE) MG tablet, Take 1 tablet (325 mg total) by mouth every Tuesday, Thursday, Saturday, and Sunday., Disp: 45 tablet, Rfl: 3   losartan -hydrochlorothiazide (HYZAAR) 50-12.5 MG tablet, Take 1 tablet by mouth daily., Disp: 90 tablet, Rfl: 3  Nintedanib (OFEV ) 100 MG CAPS, Take 1 capsule (100 mg total) by mouth 2 (two) times daily., Disp: 180 capsule, Rfl: 1   omeprazole  (PRILOSEC) 40 MG capsule, Take 1 capsule by mouth once daily, Disp: 90 capsule, Rfl: 1   VENTOLIN  HFA 108 (90 Base) MCG/ACT inhaler, Inhale 2 puffs into the lungs every 6 (six) hours as needed for wheezing or shortness of breath., Disp: 18 g, Rfl: 0   zafirlukast  (ACCOLATE ) 20 MG tablet, Take 1 tablet (20 mg total) by mouth 2 (two) times daily., Disp: 180 tablet, Rfl: 3   benralizumab  (FASENRA  PEN) 30 MG/ML prefilled autoinjector, Inject 1 mL (30 mg total) into the skin every 8 (eight) weeks. (Patient not taking: Reported on 03/26/2024), Disp: 1 mL, Rfl: 2   senna-docusate  (SENOKOT-S) 8.6-50 MG tablet, Take 2 tablets by mouth at bedtime. (Patient not taking: Reported on 03/26/2024), Disp: 60 tablet, Rfl: 4   tamsulosin  (FLOMAX ) 0.4 MG CAPS capsule, Take 1 capsule (0.4 mg total) by mouth daily. (Patient not taking: Reported on 03/26/2024), Disp: 90 capsule, Rfl: 3   tiZANidine  (ZANAFLEX ) 4 MG tablet, Take 1 tablet (4 mg total) by mouth every 6 (six) hours as needed (headache). (Patient not taking: Reported on 03/26/2024), Disp: 30 tablet, Rfl: 0

## 2024-03-25 NOTE — Patient Instructions (Incomplete)
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 # Therapeutic drug monitoring # Nausea and diarrhea due to drug  - ILD is worse since 2022 but more stable recently -Symptoms are also stable -Currently on nintedanib 100 mg once daily with some  diarrhea - This is making you take nintedanib even the low-dose irregularly -  Plan - Take CAROB Flour for Diarrhea due to medication as follows Take 1 DESSERT spoon  size serving [approximately 7 g] before breakfast If still no response in 3 days then add another 7 g at dinner If still no response in 3 days then make it to spoon servings at breakfast and 2 spoon servings at dinner and hold NOTE: Always MIX the CAROB FLOUR with WATER or MILK or JUICE - MIGHT NEED A BLENDER to do it DO NOT EAT CAROB POWER DIRECTLY - it can choke or make you cough  - Try to  nintedanib to 100 mg twice daily with help pf carob  - at next visit we can see if new drug NERANDILOMLAST is approved    Cough variant asthma Associated eosinophilia   - 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

## 2024-03-26 ENCOUNTER — Ambulatory Visit: Admitting: Internal Medicine

## 2024-03-26 ENCOUNTER — Encounter: Payer: Self-pay | Admitting: Internal Medicine

## 2024-03-26 ENCOUNTER — Telehealth: Payer: Self-pay

## 2024-03-26 VITALS — BP 126/70 | HR 79 | Temp 97.6°F | Ht 67.0 in | Wt 233.0 lb

## 2024-03-26 DIAGNOSIS — D721 Eosinophilia, unspecified: Secondary | ICD-10-CM

## 2024-03-26 DIAGNOSIS — R110 Nausea: Secondary | ICD-10-CM

## 2024-03-26 DIAGNOSIS — J45991 Cough variant asthma: Secondary | ICD-10-CM | POA: Diagnosis not present

## 2024-03-26 DIAGNOSIS — J849 Interstitial pulmonary disease, unspecified: Secondary | ICD-10-CM | POA: Diagnosis not present

## 2024-03-26 DIAGNOSIS — J679 Hypersensitivity pneumonitis due to unspecified organic dust: Secondary | ICD-10-CM

## 2024-03-26 MED ORDER — FASENRA PEN 30 MG/ML ~~LOC~~ SOAJ: 30.0000 mg | SUBCUTANEOUS | 2 refills | Status: DC

## 2024-03-26 NOTE — Telephone Encounter (Signed)
 Rx sent to [facility]

## 2024-03-26 NOTE — Telephone Encounter (Signed)
 Pt came in today for office visit, he stated he still has not received his Fasenra  since October. Called [facility] at [phone] to see if they received rx, per rep he was able to locate the rx but stated there is likely an issue because the name is not matching what they have. He stated they have on file 'Vinie Demark' and it was sent in as 'Maanav Kassabian O'Bryant'. Because of this the prescriptons we sent were not being attached to pt's profile. He advised me we can either change the name on the rx we send or have [facility] change it in their system. He transferred me to [facility] so I can update the name they have on file.  [facility] rep updated name to match what we have in the system. She suggested we resend rx because old ones were likely discarded since they didn't have a pt match. Forwarding to [place] to resend rx.

## 2024-04-01 ENCOUNTER — Telehealth: Payer: Self-pay

## 2024-04-01 NOTE — Telephone Encounter (Signed)
 Received referral from Dr. Geronimo for Jascayd -  Change ofev  to jascayd due to diarrhea   Opening benefits investigation in this thread.

## 2024-04-02 ENCOUNTER — Other Ambulatory Visit: Payer: Self-pay | Admitting: Family Medicine

## 2024-04-02 NOTE — Telephone Encounter (Signed)
 Submitted a Prior Authorization request to [facility] for JASCAYD via CoverMyMeds.   Key: [id number]

## 2024-04-05 NOTE — Telephone Encounter (Signed)
 Received a fax regarding Prior Authorization from [facility] for JASCAYD. Authorization has been DENIED because at least three formulary alternative medications in the same drug class or category have not been tried.  Phone# [phone]   Faxed URGENT appeal to [phone]. Will await response.

## 2024-04-05 NOTE — Telephone Encounter (Signed)
 Copied from CRM #[id number]. Topic: Clinical - Medication Prior Auth >> Apr 05, 2024 11:00 AM Ismael A wrote: Reason for CRM: Rashada from  [facility] - received d request from caroline regarding medication denial - for jascayd 18mg  - they have up to 72 hr turnaround time - decision should come through right after christmas

## 2024-04-06 NOTE — Telephone Encounter (Signed)
 Copied from CRM #[id number]. Topic: Clinical - Medication Prior Auth >> Apr 06, 2024  8:29 AM Corean SAUNDERS wrote: Janella with [employer] states the appeal for jascayd 18mg  has been approved and she will be mailing a letter outlining the details.

## 2024-04-07 ENCOUNTER — Other Ambulatory Visit (HOSPITAL_COMMUNITY): Payer: Self-pay

## 2024-04-07 NOTE — Telephone Encounter (Signed)
 Received notification from [facility] regarding a prior authorization for JASCAYD. Authorization has been APPROVED from 04/05/24 to 04/05/25. Approval letter sent to scan center.  Per test claim, copay for 30 days supply is $0  Patient can fill through [facility]: [phone]   Authorization # [id number] Phone # [phone]

## 2024-04-14 ENCOUNTER — Ambulatory Visit

## 2024-04-14 ENCOUNTER — Other Ambulatory Visit: Payer: Self-pay

## 2024-04-14 ENCOUNTER — Other Ambulatory Visit (HOSPITAL_COMMUNITY): Payer: Self-pay

## 2024-04-14 DIAGNOSIS — J849 Interstitial pulmonary disease, unspecified: Secondary | ICD-10-CM

## 2024-04-14 MED ORDER — JASCAYD 18 MG PO TABS
18.0000 mg | ORAL_TABLET | Freq: Two times a day (BID) | ORAL | 5 refills | Status: AC
  Filled 2024-04-14: qty 60, 30d supply, fill #0
  Filled 2024-05-12: qty 60, 30d supply, fill #1

## 2024-04-14 NOTE — Telephone Encounter (Signed)
 See pharmacotherapy visit 04/14/24 - new start Jascayd counseling complete.

## 2024-04-14 NOTE — Progress Notes (Signed)
 [facility]  Referring Provider: Dr. Geronimo  Virtual Visit via Telephone Note  I connected with William Howell on 04/14/2024 at  9:30 AM EST by telephone and verified that I am speaking with the correct person using two identifiers.  Location: Patient: home Provider: office   I discussed the limitations, risks, security and privacy concerns of performing an evaluation and management service by telephone and the availability of in person appointments. I also discussed with the patient that there may be a patient responsible charge related to this service. The patient expressed understanding and agreed to proceed.  Subjective:  Patient presents today via telephone, accompanied by spouse, to [facility] for [facility] new start education. He is referred by his pulmonologist, Dr. Geronimo. 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  Patient was last seen by Dr. Geronimo on 03/26/24. At that time, discussed continuing nintedanib at that time with plan to [facility] to nerandomilast. Referred to pharmacy team for new start nerandomilast.   Currently taking antifibrotic: Ofev  History of diarrhea, weight loss, reduced appetite: Yes - weight loss over the last year, reports hx of diarrhea and stomach pain on Ofev   Objective: Allergies[1]  Outpatient Encounter Medications as of 04/14/2024  Medication Sig Note   acetaminophen  (TYLENOL ) 325 MG tablet Take 2 tablets (650 mg total) by mouth every 6 (six) hours as needed for mild pain (pain score 1-3) or fever (or Fever >/= 101).    benralizumab  (FASENRA  PEN) 30 MG/ML prefilled autoinjector Inject 1 mL (30 mg total) into the skin every 8 (eight) weeks.    BREZTRI  AEROSPHERE 160-9-4.8 MCG/ACT AERO inhaler INHALE 2 PUFFS INTO LUNGS TWICE DAILY    cyanocobalamin  (VITAMIN B12) 500 MCG tablet Take 1 tablet (500 mcg total) by mouth daily.    EQ ALLERGY   RELIEF, CETIRIZINE , 10 MG tablet Take 1 tablet by mouth once daily    ferrous sulfate  325 (65 FE) MG tablet Take 1 tablet (325 mg total) by mouth every Tuesday, Thursday, Saturday, and Sunday.    losartan -hydrochlorothiazide (HYZAAR) 50-12.5 MG tablet Take 1 tablet by mouth daily.    Nintedanib (OFEV ) 100 MG CAPS Take 1 capsule (100 mg total) by mouth 2 (two) times daily. 12/23/2023: Per wife the pt has only been taking this medication once daily.    omeprazole  (PRILOSEC) 40 MG capsule Take 1 capsule by mouth once daily    senna-docusate (SENOKOT-S) 8.6-50 MG tablet Take 2 tablets by mouth at bedtime. (Patient not taking: Reported on 03/26/2024)    tamsulosin  (FLOMAX ) 0.4 MG CAPS capsule Take 1 capsule (0.4 mg total) by mouth daily. (Patient not taking: Reported on 03/26/2024)    tiZANidine  (ZANAFLEX ) 4 MG tablet Take 1 tablet (4 mg total) by mouth every 6 (six) hours as needed (headache). (Patient not taking: Reported on 03/26/2024)    VENTOLIN  HFA 108 (90 Base) MCG/ACT inhaler Inhale 2 puffs into the lungs every 6 (six) hours as needed for wheezing or shortness of breath.    zafirlukast  (ACCOLATE ) 20 MG tablet Take 1 tablet (20 mg total) by mouth 2 (two) times daily.    No facility-administered encounter medications on file as of 04/14/2024.     Immunization History  Administered Date(s) Administered   Fluad Quad(high Dose 65+) 01/04/2019, 03/27/2020, 04/22/2022, 01/08/2024   Fluad Trivalent(High Dose 65+) 02/11/2023   INFLUENZA, HIGH DOSE SEASONAL PF 02/17/2017   Influenza,inj,Quad PF,6+ Mos 02/10/2014, 03/14/2015, 01/09/2016, 02/12/2018   Influenza-Unspecified 02/12/2018  PFIZER(Purple Top)SARS-COV-2 Vaccination 07/10/2019, 08/04/2019   Pneumococcal Conjugate-13 07/04/2014   Pneumococcal Polysaccharide-23 12/26/2011   Td 12/26/2011   Tdap 12/26/2011, 08/15/2022   Zoster Recombinant(Shingrix) 02/20/2021, 04/18/2021   Zoster, Live 02/10/2014      PFT's TLC  Date Value Ref Range  Status  06/28/2022 4.38 L Final      CMP     Component Value Date/Time   NA 142 02/09/2024 1136   NA 144 09/26/2021 1008   K 4.5 02/09/2024 1136   CL 104 02/09/2024 1136   CO2 33 (H) 02/09/2024 1136   GLUCOSE 109 (H) 02/09/2024 1136   BUN 15 02/09/2024 1136   BUN 25 09/26/2021 1008   CREATININE 1.20 02/09/2024 1136   CALCIUM 9.1 02/09/2024 1136   PROT 6.2 02/09/2024 1136   PROT 6.8 07/19/2019 0841   ALBUMIN 3.7 12/22/2023 1214   ALBUMIN 4.3 07/19/2019 0841   AST 12 02/09/2024 1136   ALT 12 02/09/2024 1136   ALKPHOS 125 12/22/2023 1214   BILITOT 0.4 02/09/2024 1136   BILITOT 0.4 07/19/2019 0841   GFRNONAA 32 (L) 12/24/2023 0401   GFRNONAA 54 (L) 02/21/2020 0815   GFRAA 62 02/21/2020 0815    HRCT (05/22/23) IMPRESSION: 1. Pulmonary parenchymal pattern of interstitial lung disease, as detailed above, possibly more organized than on 09/20/2022 and progressive from 09/28/2020. 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 Findings are suggestive of an alternative diagnosis (not UIP) per consensus guidelines: Diagnosis of Idiopathic Pulmonary Fibrosis: An Official ATS/ERS/JRS/ALAT Clinical Practice Guideline. Am JINNY Honey Crit Care Med Vol 198, Iss 5, [id number], Dec 14 2016.  Assessment and Plan  William Howell Medication Management Thoroughly counseled patient on the efficacy, mechanism of action, dosing, administration, adverse effects, and monitoring parameters of William Howell.  Patient verbalized understanding.   Goals of Therapy: Will not stop or reverse the progression of ILD. It will slow the progression of ILD.   Dosing: Recommended dose will be 18mg  tablet, Take 1 tablet twice daily. May be administered with or without regard to food.   Adverse Effects: Weight loss (nerandomilast monotherapy: 8%) Decreased appetite (nerandomilast monotherapy: 6% to 9%) Diarrhea (nerandomilast monotherapy: 17% to 26%)  Monitoring: Monitor for diarrhea, decreased  appetite, weight loss  Drug interactions: nerandomilast is a major substrate of CYP3A4. Avoid use of moderate and strong CYP3A4 inducers or inhibitors.  No documented or reported use of moderate and strong CYP3A4 inducers or inhibitors at this time.  Access: Approval of William Howell through: insurance Rx sent to: [facility]: [phone]   Medication Reconciliation A drug regimen assessment was performed, including review of allergies, interactions, disease-state management, dosing and immunization history. Medications were reviewed with the patient, including name, instructions, indication, goals of therapy, potential side effects, importance of adherence, and safe use.  PLAN:  - Per Dr. Reeves last note, switch to nerandomilast William Howell) from Ofev . - START William Howell 18mg  tablet, Take 1 tablet twice daily with or without food - STOP Ofev  before starting William Howell. Ok to stop Ofev  then start William Howell the next day. - Follow-up with Dr. Geronimo as planned on 07/27/24 - Call our office if you have significant side effects of William Howell.  This appointment required 25 minutes of patient care (this includes precharting, chart review, review of results, virtual care, etc.).  Thank you for involving pharmacy to assist in providing this patient's care.   Aleck Puls, PharmD, BCPS, CPP Clinical Pharmacist  [facility]  [facility]     [1] No Known Allergies

## 2024-04-14 NOTE — Progress Notes (Signed)
 Specialty Pharmacy Initial Fill Coordination Note  Argil Mahl is a 83 y.o. male contacted today regarding initial fill of specialty medication(s) Nerandomilast Myrtie)   Patient requested Delivery   Delivery date: 04/19/24   Verified address: [address], [place], [place] [address]   Medication will be filled on: 04/16/24   Patient is aware of $0 copayment.

## 2024-04-16 ENCOUNTER — Other Ambulatory Visit (HOSPITAL_COMMUNITY): Payer: Self-pay

## 2024-04-16 ENCOUNTER — Other Ambulatory Visit: Payer: Self-pay

## 2024-04-16 NOTE — Telephone Encounter (Signed)
 Claim was not able to be processed until the new year started, pt's copay is currently $2100. Pt enrolled in Pulmonary Fibrosis grant through [facility]:  Amount: $9000 Award Period: 03/17/24 - 03/16/25 BIN: [id number] PCN: [id number] Group: [id number] ID: [id number]  Helpdesk phone #: [phone]

## 2024-04-19 ENCOUNTER — Other Ambulatory Visit: Payer: Self-pay

## 2024-04-20 ENCOUNTER — Other Ambulatory Visit (HOSPITAL_COMMUNITY): Payer: Self-pay

## 2024-04-21 ENCOUNTER — Encounter: Payer: Self-pay | Admitting: Pharmacist

## 2024-04-21 NOTE — Telephone Encounter (Signed)
 Error

## 2024-04-22 NOTE — Progress Notes (Signed)
 Please see note dated 04/14/24 - patient was educated in detail prior to first dispense of Jascayd .   PLAN:  - Per Dr. Reeves last note, switch to nerandomilast  (Jascayd ) from Ofev . - START Jascayd  18mg  tablet, Take 1 tablet twice daily with or without food - STOP Ofev  before starting Jascayd . Ok to stop Ofev  then start Jascayd  the next day. - Follow-up with Dr. Geronimo as planned on 07/27/24 - Call our office if you have significant side effects of Jascayd .  Aleck Puls, PharmD, BCPS, CPP Clinical Pharmacist  [facility]  [facility]

## 2024-04-23 ENCOUNTER — Other Ambulatory Visit (HOSPITAL_COMMUNITY): Payer: Self-pay

## 2024-04-23 ENCOUNTER — Telehealth: Payer: Self-pay

## 2024-04-23 DIAGNOSIS — J45991 Cough variant asthma: Secondary | ICD-10-CM

## 2024-04-23 NOTE — Telephone Encounter (Signed)
 Submitted a Prior Authorization request to [facility] for FASENRA  via CoverMyMeds. Will update once we receive a response.  Key: [id number]

## 2024-04-23 NOTE — Telephone Encounter (Signed)
 Copied from CRM #[id number]. Topic: General - Other >> Apr 23, 2024 10:23 AM Joesph PARAS wrote: Reason for CRM: BC[employer] Medicare is calling to notify us  that FASENRA  injection has been approved for on eyear starting from tdays date. C/b for questions at [phone] opt 5.

## 2024-04-26 ENCOUNTER — Other Ambulatory Visit

## 2024-04-27 ENCOUNTER — Other Ambulatory Visit

## 2024-04-29 ENCOUNTER — Other Ambulatory Visit: Payer: Self-pay | Admitting: Family Medicine

## 2024-04-29 ENCOUNTER — Ambulatory Visit: Payer: Self-pay | Admitting: Family Medicine

## 2024-04-30 ENCOUNTER — Ambulatory Visit: Admitting: Family Medicine

## 2024-04-30 ENCOUNTER — Encounter: Payer: Self-pay | Admitting: Family Medicine

## 2024-04-30 VITALS — BP 126/62 | HR 75 | Temp 97.7°F | Ht 67.0 in | Wt 236.8 lb

## 2024-04-30 DIAGNOSIS — E119 Type 2 diabetes mellitus without complications: Secondary | ICD-10-CM

## 2024-04-30 NOTE — Telephone Encounter (Signed)
 Requested medication (s) are due for refill today: yes  Requested medication (s) are on the active medication list: yes  Last refill:  04/02/24  Future visit scheduled: yes  Notes to clinic:   Medication not assigned to a protocol, review manually.     Requested Prescriptions  Pending Prescriptions Disp Refills   BREZTRI  AEROSPHERE 160-9-4.8 MCG/ACT AERO inhaler [Pharmacy Med Name: Breztri  Aerosphere 160-9-4.8 MCG/ACT Inhalation Aerosol] 11 g 0    Sig: INHALE 2 PUFFS INTO LUNGS TWICE DAILY     Off-Protocol Failed - 04/30/2024 10:25 AM      Failed - Medication not assigned to a protocol, review manually.      Passed - Valid encounter within last 12 months    Recent Outpatient Visits           Today Diabetes mellitus without complication [facility])   [place] [facility] [name], Butler DASEN, MD   2 weeks ago ILD (interstitial lung disease) [facility])   [facility] [place] Veronia Fitch L, RPH-CPP   2 months ago Tension headache   [place] [facility] Kayla Jeoffrey RAMAN, FNP   2 months ago Benign essential HTN   [place] [facility] Duanne, Butler DASEN, MD   4 months ago Acute pyelonephritis   [place] [facility] [name], Butler DASEN, MD

## 2024-04-30 NOTE — Progress Notes (Signed)
 "  Subjective:    Patient ID: William Howell, male    DOB: 1940-07-24, 84 y.o.   MRN: [id number]  Patient is an 84 year old Caucasian gentleman here today for a checkup.  Past medical history is significant for type 2 diabetes.  He also has underlying interstitial lung disease however he has been doing extremely well from that standpoint.  He has reduced his environmental exposures and with the assistance of medication, he states his breathing has never been better.  His blood pressure today is outstanding at 126/62.  His cholesterol is excellent.  His hemoglobin A1c is still pending Past Medical History:  Diagnosis Date   Allergy     Asthma    Diabetes mellitus type II, controlled (HCC)    GERD (gastroesophageal reflux disease)    pt states he takes omeprazole  but doesnt have GERD   Hypertension    Obesity    Sleep apnea    uses CPAP   Past Surgical History:  Procedure Laterality Date   CHOLECYSTECTOMY     CYSTOSCOPY/URETEROSCOPY/HOLMIUM LASER/STENT PLACEMENT Right 12/23/2023   Procedure: CYSTOSCOPY/URETEROSCOPY/HOLMIUM LASER/STENT PLACEMENT;  Surgeon: Alvaro Ricardo KATHEE Mickey., MD;  Location: [facility];  Service: Urology;  Laterality: Right;  Possible stent placement   DENTAL SURGERY     Current Outpatient Medications on File Prior to Visit  Medication Sig Dispense Refill   acetaminophen  (TYLENOL ) 325 MG tablet Take 2 tablets (650 mg total) by mouth every 6 (six) hours as needed for mild pain (pain score 1-3) or fever (or Fever >/= 101).     benralizumab  (FASENRA  PEN) 30 MG/ML prefilled autoinjector Inject 1 mL (30 mg total) into the skin every 8 (eight) weeks. 1 mL 2   BREZTRI  AEROSPHERE 160-9-4.8 MCG/ACT AERO inhaler INHALE 2 PUFFS INTO LUNGS TWICE DAILY 11 g 0   cyanocobalamin  (VITAMIN B12) 500 MCG tablet Take 1 tablet (500 mcg total) by mouth daily. 30 tablet 4   EQ ALLERGY  RELIEF, CETIRIZINE , 10 MG tablet Take 1 tablet by mouth once daily 90 tablet 0   ferrous sulfate  325 (65 FE)  MG tablet Take 1 tablet (325 mg total) by mouth every Tuesday, Thursday, Saturday, and Sunday. 45 tablet 3   losartan -hydrochlorothiazide (HYZAAR) 50-12.5 MG tablet Take 1 tablet by mouth daily. 90 tablet 3   nerandomilast  (JASCAYD ) 18 MG tablet Take 18 mg by mouth every 12 (twelve) hours. 60 tablet 5   omeprazole  (PRILOSEC) 40 MG capsule Take 1 capsule by mouth once daily 90 capsule 1   tamsulosin  (FLOMAX ) 0.4 MG CAPS capsule Take 1 capsule (0.4 mg total) by mouth daily. 90 capsule 3   VENTOLIN  HFA 108 (90 Base) MCG/ACT inhaler Inhale 2 puffs into the lungs every 6 (six) hours as needed for wheezing or shortness of breath. 18 g 0   zafirlukast  (ACCOLATE ) 20 MG tablet Take 1 tablet (20 mg total) by mouth 2 (two) times daily. 180 tablet 3   No current facility-administered medications on file prior to visit.   No Known Allergies Social History   Socioeconomic History   Marital status: Married    Spouse name: Channing   Number of children: 5   Years of education: Not on file   Highest education level: 12th grade  Occupational History   Not on file  Tobacco Use   Smoking status: Never   Smokeless tobacco: Former    Types: Engineer, Drilling   Vaping status: Never Used  Substance and Sexual Activity   Alcohol use: Yes  Comment: occasional    Drug use: No   Sexual activity: Not Currently  Other Topics Concern   Not on file  Social History Narrative   3 children, 2 stepchildren   12 grandchildren   7 great grandchildren.   Retired Psychologist, Forensic.    Social Drivers of Health   Tobacco Use: Medium Risk (04/30/2024)   Patient History    Smoking Tobacco Use: Never    Smokeless Tobacco Use: Former    Passive Exposure: Not on Actuary Strain: Low Risk (11/19/2023)   Overall Financial Resource Strain (CARDIA)    Difficulty of Paying Living Expenses: Not hard at all  Food Insecurity: No Food Insecurity (12/25/2023)   Epic    Worried About Programme Researcher, Broadcasting/film/video in  the Last Year: Never true    Ran Out of Food in the Last Year: Never true  Transportation Needs: No Transportation Needs (12/25/2023)   Epic    Lack of Transportation (Medical): No    Lack of Transportation (Non-Medical): No  Physical Activity: Sufficiently Active (11/19/2023)   Exercise Vital Sign    Days of Exercise per Week: 6 days    Minutes of Exercise per Session: 60 min  Stress: No Stress Concern Present (11/19/2023)   [employer] of Occupational Health - Occupational Stress Questionnaire    Feeling of Stress: Only a little  Social Connections: Moderately Isolated (12/22/2023)   Social Connection and Isolation Panel    Frequency of Communication with Friends and Family: More than three times a week    Frequency of Social Gatherings with Friends and Family: More than three times a week    Attends Religious Services: Patient declined    Active Member of Clubs or Organizations: No    Attends Banker Meetings: Never    Marital Status: Married  Catering Manager Violence: Not At Risk (12/25/2023)   Epic    Fear of Current or Ex-Partner: No    Emotionally Abused: No    Physically Abused: No    Sexually Abused: No  Depression (PHQ2-9): Low Risk (04/30/2024)   Depression (PHQ2-9)    PHQ-2 Score: 0  Alcohol Screen: Low Risk (11/19/2023)   Alcohol Screen    Last Alcohol Screening Score (AUDIT): 2  Housing: Unknown (12/25/2023)   Epic    Unable to Pay for Housing in the Last Year: No    Number of Times Moved in the Last Year: Not on file    Homeless in the Last Year: No  Utilities: Not At Risk (12/25/2023)   Epic    Threatened with loss of utilities: No  Health Literacy: Adequate Health Literacy (11/19/2023)   B1300 Health Literacy    Frequency of need for help with medical instructions: Never      Review of Systems  All other systems reviewed and are negative.      Objective:   Physical Exam Vitals reviewed.  Constitutional:      General: He is not in acute  distress.    Appearance: He is well-developed. He is not diaphoretic.  HENT:     Head: Normocephalic and atraumatic.     Right Ear: Tympanic membrane, ear canal and external ear normal.     Left Ear: Tympanic membrane, ear canal and external ear normal.     Nose: No congestion or rhinorrhea.     Mouth/Throat:     Mouth: Mucous membranes are moist.     Pharynx: Oropharynx is clear. No oropharyngeal  exudate or posterior oropharyngeal erythema.  Eyes:     General: No scleral icterus.       Right eye: No discharge.        Left eye: No discharge.     Conjunctiva/sclera: Conjunctivae normal.     Pupils: Pupils are equal, round, and reactive to light.  Neck:     Thyroid: No thyromegaly.     Vascular: No JVD.     Trachea: No tracheal deviation.  Cardiovascular:     Rate and Rhythm: Normal rate and regular rhythm.     Heart sounds: Normal heart sounds. No murmur heard.    No friction rub. No gallop.  Pulmonary:     Effort: Pulmonary effort is normal. No accessory muscle usage, prolonged expiration or respiratory distress.     Breath sounds: No stridor or decreased air movement. No wheezing, rhonchi or rales.  Chest:     Chest wall: No tenderness.  Musculoskeletal:     Cervical back: Normal range of motion and neck supple.  Lymphadenopathy:     Cervical: No cervical adenopathy.  Skin:    General: Skin is warm.     Coloration: Skin is not pale.     Findings: No erythema or rash.  Neurological:     Mental Status: He is alert and oriented to person, place, and time.     Cranial Nerves: No cranial nerve deficit.     Motor: No abnormal muscle tone.     Coordination: Coordination normal.     Deep Tendon Reflexes: Reflexes are normal and symmetric.  Psychiatric:        Behavior: Behavior normal.        Thought Content: Thought content normal.        Judgment: Judgment normal.           Assessment & Plan:  Diabetes mellitus without complication (HCC) - Plan: Hemoglobin A1c From  a pulmonary standpoint, his lungs sound better today than they have in years.  He seems to be doing really well.  I recommended the RSV vaccine.  His blood pressure is outstanding.  His cholesterol is excellent.  His hemoglobin A1c is pending.  We discussed GLP-1 agonist to help with weight loss however the patient had a bad experience with Ozempic  and has no desire to start those.  If his A1c is greater than 6.5, we will consider metformin  Lab on 04/26/2024  Component Date Value Ref Range Status   WBC 04/27/2024 8.7  3.8 - 10.8 Thousand/uL Final   RBC 04/27/2024 4.32  4.20 - 5.80 Million/uL Final   Hemoglobin 04/27/2024 13.2  13.2 - 17.1 g/dL Final   HCT 98/86/7973 41.6  39.4 - 51.1 % Final   MCV 04/27/2024 96.3  81.4 - 101.7 fL Final   MCH 04/27/2024 30.6  27.0 - 33.0 pg Final   MCHC 04/27/2024 31.7  31.6 - 35.4 g/dL Final   RDW 98/86/7973 13.2  11.0 - 15.0 % Final   Platelets 04/27/2024 312  140 - 400 Thousand/uL Final   MPV 04/27/2024 9.9  7.5 - 12.5 fL Final   Neutro Abs 04/27/2024 5,664  1,500 - 7,800 cells/uL Final   Absolute Lymphocytes 04/27/2024 2,279  850 - 3,900 cells/uL Final   Absolute Monocytes 04/27/2024 748  200 - 950 cells/uL Final   Eosinophils Absolute 04/27/2024 0 (L)  15 - 500 cells/uL Final   Basophils Absolute 04/27/2024 9  0 - 200 cells/uL Final   Neutrophils Relative % 04/27/2024 65.1  %  Final   Total Lymphocyte 04/27/2024 26.2  % Final   Monocytes Relative 04/27/2024 8.6  % Final   Eosinophils Relative 04/27/2024 0.0  % Final   Basophils Relative 04/27/2024 0.1  % Final   Glucose, Bld 04/27/2024 169 (H)  65 - 99 mg/dL Final   Comment: .            Fasting reference interval . For someone without known diabetes, a glucose value >125 mg/dL indicates that they may have diabetes and this should be confirmed with a follow-up test. .    BUN 04/27/2024 17  7 - 25 mg/dL Final   Creat 98/86/7973 1.19  0.70 - 1.22 mg/dL Final   BUN/Creatinine Ratio 04/27/2024 SEE  NOTE:  6 - 22 (calc) Final   Comment:    Not Reported: BUN and Creatinine are within    reference range. .    Sodium 04/27/2024 143  135 - 146 mmol/L Final   Potassium 04/27/2024 4.9  3.5 - 5.3 mmol/L Final   Chloride 04/27/2024 100  98 - 110 mmol/L Final   CO2 04/27/2024 32  20 - 32 mmol/L Final   Calcium 04/27/2024 9.5  8.6 - 10.3 mg/dL Final   Total Protein 98/86/7973 6.7  6.1 - 8.1 g/dL Final   Albumin 98/86/7973 4.0  3.6 - 5.1 g/dL Final   Globulin 98/86/7973 2.7  1.9 - 3.7 g/dL (calc) Final   AG Ratio 04/27/2024 1.5  1.0 - 2.5 (calc) Final   Total Bilirubin 04/27/2024 0.3  0.2 - 1.2 mg/dL Final   Alkaline phosphatase (APISO) 04/27/2024 74  35 - 144 U/L Final   AST 04/27/2024 9 (L)  10 - 35 U/L Final   ALT 04/27/2024 6 (L)  9 - 46 U/L Final   Cholesterol 04/27/2024 132  <200 mg/dL Final   HDL 98/86/7973 50  > OR = 40 mg/dL Final   Triglycerides 98/86/7973 107  <150 mg/dL Final   LDL Cholesterol (Calc) 04/27/2024 63  mg/dL (calc) Final   Comment: Reference range: <100 . Desirable range <100 mg/dL for primary prevention;   <70 mg/dL for patients with CHD or diabetic patients  with > or = 2 CHD risk factors. SABRA LDL-C is now calculated using the Martin-Hopkins  calculation, which is a validated novel method providing  better accuracy than the Friedewald equation in the  estimation of LDL-C.  Gladis APPLETHWAITE et al. SANDREA. 7986;689(80): 2061-2068  (http://education.Quest[employer].com/faq/FAQ164)    Total CHOL/HDL Ratio 04/27/2024 2.6  <4.9 (calc) Final   Non-HDL Cholesterol (Calc) 04/27/2024 82  <130 mg/dL (calc) Final   Comment: For patients with diabetes plus 1 major ASCVD risk  factor, treating to a non-HDL-C goal of <100 mg/dL  (LDL-C of <29 mg/dL) is considered a therapeutic  option.    Creatinine, Urine 04/27/2024 77  20 - 320 mg/dL Final   Microalb, Ur 98/86/7973 0.6  mg/dL Final   Comment: Reference Range Not established    Microalb Creat Ratio 04/27/2024 8  <30 mg/g  creat Final   Comment: . The ADA defines abnormalities in albumin excretion as follows: SABRA Albuminuria Category        Result (mg/g creatinine) . Normal to Mildly increased   <30 Moderately increased         30-299  Severely increased           > OR = 300 . The ADA recommends that at least two of three specimens collected within a 3-6 month period be abnormal before  considering a patient to be within a diagnostic category.    Vitamin B-12 04/27/2024 >2,000 (H)  200 - 1,100 pg/mL Final    "

## 2024-05-03 LAB — HEMOGLOBIN A1C
Hgb A1c MFr Bld: 6.3 % — ABNORMAL HIGH
Mean Plasma Glucose: 134 mg/dL
eAG (mmol/L): 7.4 mmol/L

## 2024-05-03 LAB — COMPLETE METABOLIC PANEL WITHOUT GFR
AG Ratio: 1.5 (calc) (ref 1.0–2.5)
ALT: 6 U/L — ABNORMAL LOW (ref 9–46)
AST: 9 U/L — ABNORMAL LOW (ref 10–35)
Albumin: 4 g/dL (ref 3.6–5.1)
Alkaline phosphatase (APISO): 74 U/L (ref 35–144)
BUN: 17 mg/dL (ref 7–25)
CO2: 32 mmol/L (ref 20–32)
Calcium: 9.5 mg/dL (ref 8.6–10.3)
Chloride: 100 mmol/L (ref 98–110)
Creat: 1.19 mg/dL (ref 0.70–1.22)
Globulin: 2.7 g/dL (ref 1.9–3.7)
Glucose, Bld: 169 mg/dL — ABNORMAL HIGH (ref 65–99)
Potassium: 4.9 mmol/L (ref 3.5–5.3)
Sodium: 143 mmol/L (ref 135–146)
Total Bilirubin: 0.3 mg/dL (ref 0.2–1.2)
Total Protein: 6.7 g/dL (ref 6.1–8.1)

## 2024-05-03 LAB — CBC WITH DIFFERENTIAL/PLATELET
Absolute Lymphocytes: 2279 {cells}/uL (ref 850–3900)
Absolute Monocytes: 748 {cells}/uL (ref 200–950)
Basophils Absolute: 9 {cells}/uL (ref 0–200)
Basophils Relative: 0.1 %
Eosinophils Absolute: 0 {cells}/uL — ABNORMAL LOW (ref 15–500)
Eosinophils Relative: 0 %
HCT: 41.6 % (ref 39.4–51.1)
Hemoglobin: 13.2 g/dL (ref 13.2–17.1)
MCH: 30.6 pg (ref 27.0–33.0)
MCHC: 31.7 g/dL (ref 31.6–35.4)
MCV: 96.3 fL (ref 81.4–101.7)
MPV: 9.9 fL (ref 7.5–12.5)
Monocytes Relative: 8.6 %
Neutro Abs: 5664 {cells}/uL (ref 1500–7800)
Neutrophils Relative %: 65.1 %
Platelets: 312 Thousand/uL (ref 140–400)
RBC: 4.32 Million/uL (ref 4.20–5.80)
RDW: 13.2 % (ref 11.0–15.0)
Total Lymphocyte: 26.2 %
WBC: 8.7 Thousand/uL (ref 3.8–10.8)

## 2024-05-03 LAB — TEST AUTHORIZATION

## 2024-05-03 LAB — VITAMIN B12: Vitamin B-12: >2000 pg/mL — ABNORMAL HIGH (ref 200–1100)

## 2024-05-03 LAB — LIPID PANEL
Cholesterol: 132 mg/dL
HDL: 50 mg/dL
LDL Cholesterol (Calc): 63 mg/dL
Non-HDL Cholesterol (Calc): 82 mg/dL
Total CHOL/HDL Ratio: 2.6 (calc)
Triglycerides: 107 mg/dL

## 2024-05-03 LAB — MICROALBUMIN / CREATININE URINE RATIO
Creatinine, Urine: 77 mg/dL (ref 20–320)
Microalb Creat Ratio: 8 mg/g{creat}
Microalb, Ur: 0.6 mg/dL

## 2024-05-06 ENCOUNTER — Other Ambulatory Visit (HOSPITAL_COMMUNITY): Payer: Self-pay

## 2024-05-06 NOTE — Telephone Encounter (Signed)
 Received notification from [facility] regarding a prior authorization for FASENRA . Authorization has been APPROVED from 04/23/24 to 04/23/25. Have not yet received approval letter.  Per test claim, copay for 28 days supply is $0. Pt is in catastrophic coverage.  Patient can fill through [facility]: [phone]   Authorization # [id number] Phone # [phone]  Can transition pt to filling with [facility]

## 2024-05-07 ENCOUNTER — Ambulatory Visit: Attending: Internal Medicine

## 2024-05-07 DIAGNOSIS — J45991 Cough variant asthma: Secondary | ICD-10-CM

## 2024-05-07 DIAGNOSIS — J4540 Moderate persistent asthma, uncomplicated: Secondary | ICD-10-CM

## 2024-05-07 DIAGNOSIS — D721 Eosinophilia, unspecified: Secondary | ICD-10-CM

## 2024-05-07 MED ORDER — FASENRA PEN 30 MG/ML ~~LOC~~ SOAJ
30.0000 mg | SUBCUTANEOUS | 2 refills | Status: AC
  Filled 2024-05-11: qty 1, 56d supply, fill #0

## 2024-05-07 NOTE — Telephone Encounter (Signed)
 See pharmacotherapy visit note - counseling completed for Fasenra . Can fill through [facility].

## 2024-05-07 NOTE — Progress Notes (Cosign Needed)
 [facility]  Referring Provider: Dr. Geronimo  Virtual Visit via Telephone Note  I connected with William Howell on 05/07/24 at  2:30 PM EST by telephone and verified that I am speaking with the correct person using two identifiers.  Location: Patient: home Provider: office   I discussed the limitations, risks, security and privacy concerns of performing an evaluation and management service by telephone and the availability of in person appointments. I also discussed with the patient that there may be a patient responsible charge related to this service. The patient expressed understanding and agreed to proceed.   HPI: William Howell is a 84 y.o. male who presents to the pharmacotherapy clinic via telephone for Fasenra  follow-up counseling. Previously obtained Fasenra  through patient assistance in 2025, now covered by insurance d/t catastrophic coverage and will be obtained through [facility]. Contact with patient today to discuss process for obtaining [facility]).   Patient Active Problem List   Diagnosis Date Noted   Tension headache 02/18/2024   Urinary frequency 02/18/2024   Benign essential HTN 02/18/2024   Kidney stone 12/23/2023   Right ureteral stone 12/22/2023   UTI (urinary tract infection) 12/22/2023   Acute renal failure superimposed on stage 3a chronic kidney disease (HCC) 12/22/2023   Hypertension    ILD (interstitial lung disease) (HCC) 09/30/2022   Lab test positive for detection of COVID-19 virus 04/08/2019   Diabetes mellitus without complication (HCC) 04/08/2019   Aortic valve regurgitation 04/21/2017   Other fatigue 08/26/2016   RBBB 08/26/2016   Acute bronchitis 04/11/2016   Asthma, chronic 01/09/2016   Dyspnea 12/12/2015   Sleep apnea     Patient's Medications  New Prescriptions   No medications on file  Previous Medications   ACETAMINOPHEN  (TYLENOL ) 325 MG TABLET    Take 2 tablets (650 mg total) by mouth every 6 (six)  hours as needed for mild pain (pain score 1-3) or fever (or Fever >/= 101).   BENRALIZUMAB  (FASENRA  PEN) 30 MG/ML PREFILLED AUTOINJECTOR    Inject 1 mL (30 mg total) into the skin every 8 (eight) weeks.   BREZTRI  AEROSPHERE 160-9-4.8 MCG/ACT AERO INHALER    INHALE 2 PUFFS INTO LUNGS TWICE DAILY   CYANOCOBALAMIN  (VITAMIN B12) 500 MCG TABLET    Take 1 tablet (500 mcg total) by mouth daily.   EQ ALLERGY  RELIEF, CETIRIZINE , 10 MG TABLET    Take 1 tablet by mouth once daily   FERROUS SULFATE  325 (65 FE) MG TABLET    Take 1 tablet (325 mg total) by mouth every Tuesday, Thursday, Saturday, and Sunday.   LOSARTAN -HYDROCHLOROTHIAZIDE (HYZAAR) 50-12.5 MG TABLET    Take 1 tablet by mouth daily.   NERANDOMILAST  (JASCAYD ) 18 MG TABLET    Take 18 mg by mouth every 12 (twelve) hours.   OMEPRAZOLE  (PRILOSEC) 40 MG CAPSULE    Take 1 capsule by mouth once daily   TAMSULOSIN  (FLOMAX ) 0.4 MG CAPS CAPSULE    Take 1 capsule (0.4 mg total) by mouth daily.   VENTOLIN  HFA 108 (90 BASE) MCG/ACT INHALER    Inhale 2 puffs into the lungs every 6 (six) hours as needed for wheezing or shortness of breath.   ZAFIRLUKAST  (ACCOLATE ) 20 MG TABLET    Take 1 tablet (20 mg total) by mouth 2 (two) times daily.  Modified Medications   No medications on file  Discontinued Medications   No medications on file    Allergies: Allergies[1]  Past Medical History: Past Medical History:  Diagnosis  Date   Allergy     Asthma    Diabetes mellitus type II, controlled (HCC)    GERD (gastroesophageal reflux disease)    pt states he takes omeprazole  but doesnt have GERD   Hypertension    Obesity    Sleep apnea    uses CPAP    Social History: Social History   Socioeconomic History   Marital status: Married    Spouse name: Channing   Number of children: 5   Years of education: Not on file   Highest education level: 12th grade  Occupational History   Not on file  Tobacco Use   Smoking status: Never   Smokeless tobacco: Former     Types: Engineer, Drilling   Vaping status: Never Used  Substance and Sexual Activity   Alcohol use: Yes    Comment: occasional    Drug use: No   Sexual activity: Not Currently  Other Topics Concern   Not on file  Social History Narrative   3 children, 2 stepchildren   12 grandchildren   7 great grandchildren.   Retired Psychologist, Forensic.    Social Drivers of Health   Tobacco Use: Medium Risk (04/30/2024)   Patient History    Smoking Tobacco Use: Never    Smokeless Tobacco Use: Former    Passive Exposure: Not on Actuary Strain: Low Risk (11/19/2023)   Overall Financial Resource Strain (CARDIA)    Difficulty of Paying Living Expenses: Not hard at all  Food Insecurity: No Food Insecurity (12/25/2023)   Epic    Worried About Programme Researcher, Broadcasting/film/video in the Last Year: Never true    Ran Out of Food in the Last Year: Never true  Transportation Needs: No Transportation Needs (12/25/2023)   Epic    Lack of Transportation (Medical): No    Lack of Transportation (Non-Medical): No  Physical Activity: Sufficiently Active (11/19/2023)   Exercise Vital Sign    Days of Exercise per Week: 6 days    Minutes of Exercise per Session: 60 min  Stress: No Stress Concern Present (11/19/2023)   [employer] of Occupational Health - Occupational Stress Questionnaire    Feeling of Stress: Only a little  Social Connections: Moderately Isolated (12/22/2023)   Social Connection and Isolation Panel    Frequency of Communication with Friends and Family: More than three times a week    Frequency of Social Gatherings with Friends and Family: More than three times a week    Attends Religious Services: Patient declined    Active Member of Clubs or Organizations: No    Attends Banker Meetings: Never    Marital Status: Married  Depression (PHQ2-9): Low Risk (04/30/2024)   Depression (PHQ2-9)    PHQ-2 Score: 0  Alcohol Screen: Low Risk (11/19/2023)   Alcohol Screen    Last Alcohol  Screening Score (AUDIT): 2  Housing: Unknown (12/25/2023)   Epic    Unable to Pay for Housing in the Last Year: No    Number of Times Moved in the Last Year: Not on file    Homeless in the Last Year: No  Utilities: Not At Risk (12/25/2023)   Epic    Threatened with loss of utilities: No  Health Literacy: Adequate Health Literacy (11/19/2023)   B1300 Health Literacy    Frequency of need for help with medical instructions: Never    Biologics training for benralizumab  (Fasenra )  Goals of therapy: Mechanism of action: humanized afucosylated, monoclonal  antibody (IgG1, kappa) that binds to the alpha subunit of the interleukin-5 receptor. IL-5 is the major cytokine responsible for the growth and differentiation, recruitment, activation, and survival of eosinophils (a cell type associated with inflammation and an important component in the pathogenesis of asthma) Reviewed that Fasenra  is add-on medication and patient must continue maintenance inhaler regimen. Response to therapy: may take 4 months to determine efficacy. Discussed that patients generally feel improvement sooner than 4 months.  Side effects: antibody development (13%), headache (8%), pharyngitis (5%), injection site reaction (2.2%)  Dose: 30 mg subcutaneously at Week 0, Week 4, Week 8, then 30mg  every 8 weeks thereafter  Administration/Storage:   Reviewed administration sites of thigh or abdomen (at least 2-3 inches away from abdomen). Reviewed the upper arm is only appropriate if caregiver is administering injection  Do not shake the pen/syringe as this could lead to product foaming or precipitation. \ Reviewed storage of medication in refrigerator. Reviewed that Fasenra  can be stored at room temperature in unopened carton for up to 14 days.  Side effects: headache (19%), injection site reaction (7-15%), antibody development (6%), backache (5%), fatigue (5%)  Access: Approval of Fasenra  through: insurance  Medication  Reconciliation  A drug regimen assessment was performed, including review of allergies, interactions, disease-state management, dosing and immunization history. Medications were reviewed with the patient, including name, instructions, indication, goals of therapy, potential side effects, importance of adherence, and safe use.  Plan: - CONTINUE Fasenra  30mg  [place] every 8 weeks  - Rx triaged to [facility]  I discussed the assessment and treatment plan with the patient. The patient was provided an opportunity to ask questions and all were answered. The patient agreed with the plan and demonstrated an understanding of the instructions.   The patient was advised to call back or seek an in-person evaluation if the symptoms worsen or if the condition fails to improve as anticipated.  I provided 10 minutes of non-face-to-face time during this encounter.  Aleck Puls, PharmD, BCPS, CPP Clinical Pharmacist  [facility]  [facility]      [1] No Known Allergies

## 2024-05-11 ENCOUNTER — Other Ambulatory Visit (HOSPITAL_COMMUNITY): Payer: Self-pay

## 2024-05-11 ENCOUNTER — Other Ambulatory Visit: Payer: Self-pay

## 2024-05-11 NOTE — Progress Notes (Unsigned)
 Specialty Pharmacy Initial Fill Coordination Note  William Howell is a 84 y.o. male contacted today regarding initial fill of specialty medication(s) Benralizumab  (Fasenra  Pen)   Patient requested Delivery   Delivery date: 05/13/24   Verified address: [address], [place], [place] [address]   Medication will be filled on: 05/12/24   Patient is aware of $0 copayment.    **Pt would like Fasenra  and Jascayd  shipped together if possible**

## 2024-05-12 ENCOUNTER — Other Ambulatory Visit: Payer: Self-pay

## 2024-05-12 NOTE — Progress Notes (Signed)
 Specialty Pharmacy Refill Coordination Note  William Howell is a 84 y.o. male contacted today regarding refills of specialty medication(s) Nerandomilast  (Jascayd )   Patient requested Delivery   Delivery date: 05/14/24   Verified address: [address] [place], [place] [address]   Medication will be filled on: 05/13/24

## 2024-05-13 ENCOUNTER — Other Ambulatory Visit: Payer: Self-pay

## 2024-05-17 IMAGING — CT CT ANGIO CHEST
3 of 8 series · 17 of 46 positions shown · IV contrast (OMNIPAQUE 350)
Comparison: September 28, 2020

CLINICAL DATA: Thoracic aortic aneurysm (ROKADE), follow up ascending
aortic dilatation

EXAM:
CT ANGIOGRAPHY CHEST WITH CONTRAST
TECHNIQUE: Multidetector CT imaging of the chest was performed using the
standard protocol during bolus administration of intravenous
contrast. Multiplanar CT image reconstructions and MIPs were
obtained to evaluate the vascular anatomy.

[Series 4: aorta 3.0 bf37 2 · axial · 0.80mm/px · z∈[-274,-26]mm · 12 of 99 slices shown]
[im 8/99  lung]
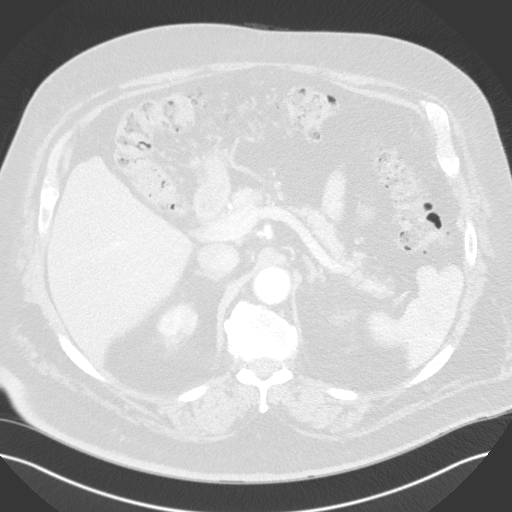
[im 16/99  soft-tissue]
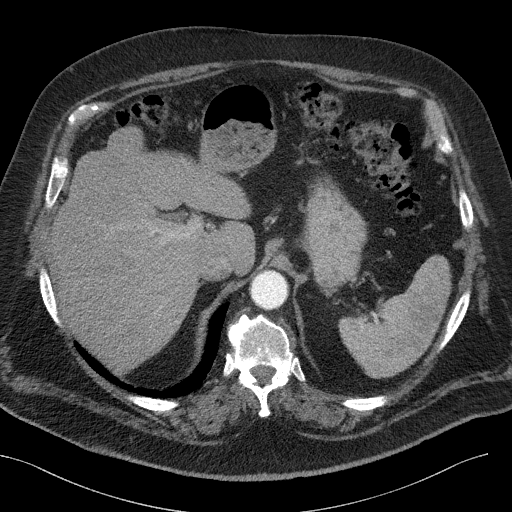
[im 23/99  lung]
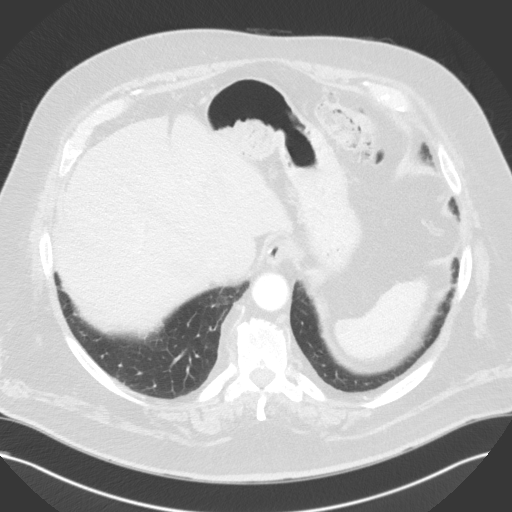
[im 31/99  soft-tissue]
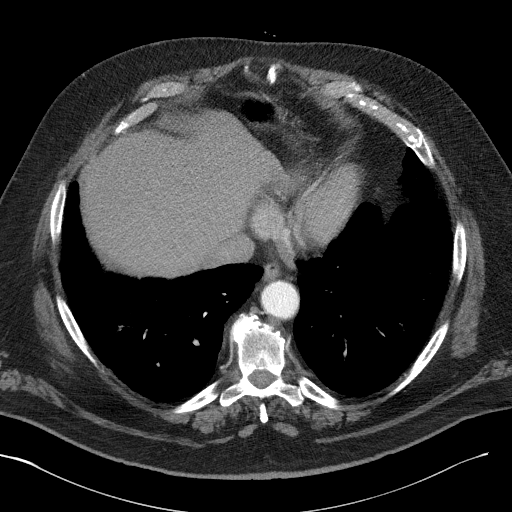
[im 38/99  lung]
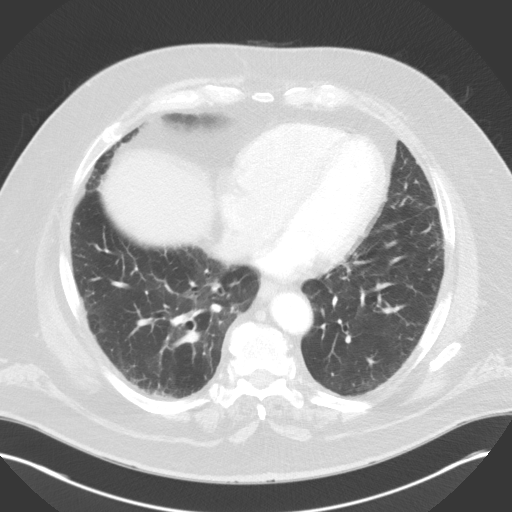
[im 46/99  soft-tissue]
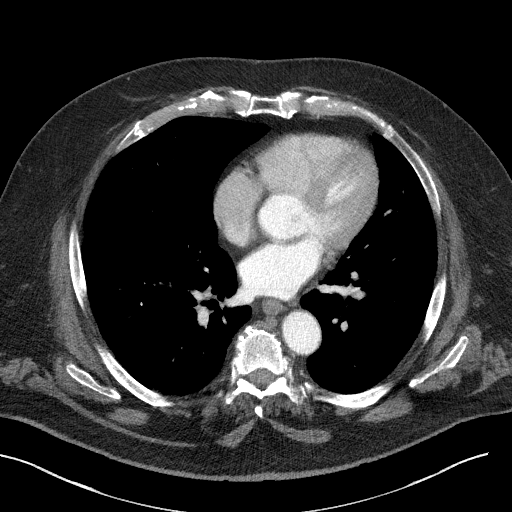
[im 53/99  lung]
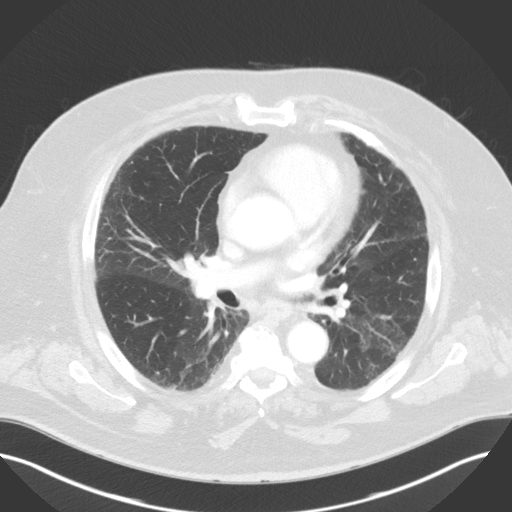
[im 61/99  soft-tissue]
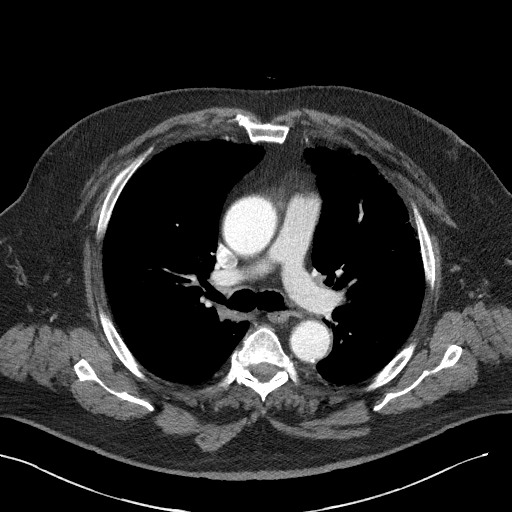
[im 68/99  lung]
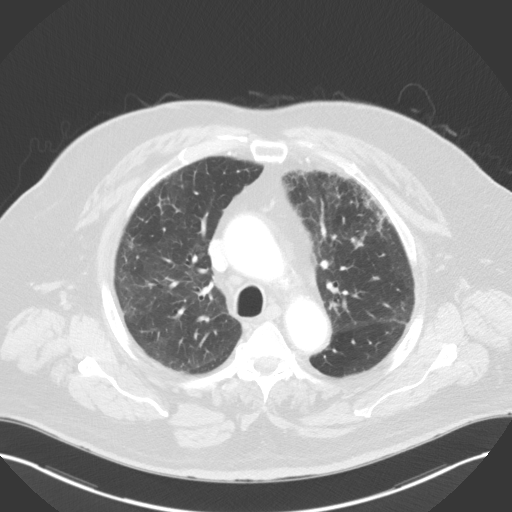
[im 76/99  soft-tissue]
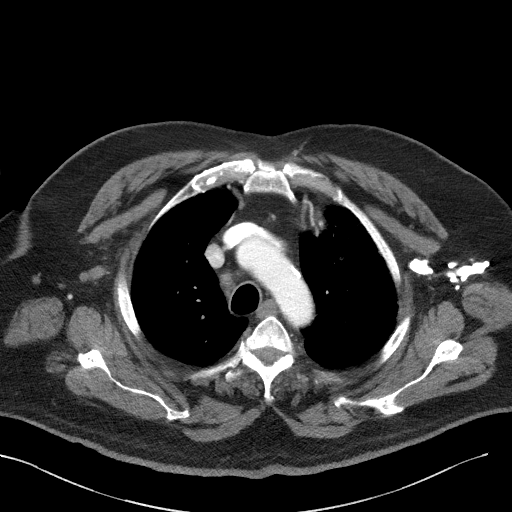
[im 83/99  lung]
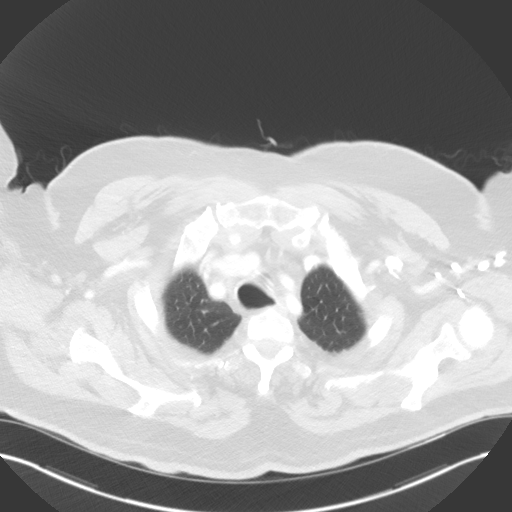
[im 91/99  soft-tissue]
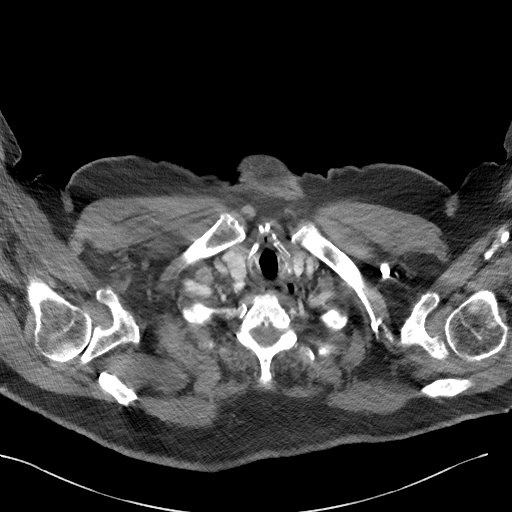

[Series 5: lung · axial · 0.80mm/px · z∈[-274,-232]mm · 2 of 99 slices shown]
[im 8/99  soft-tissue]
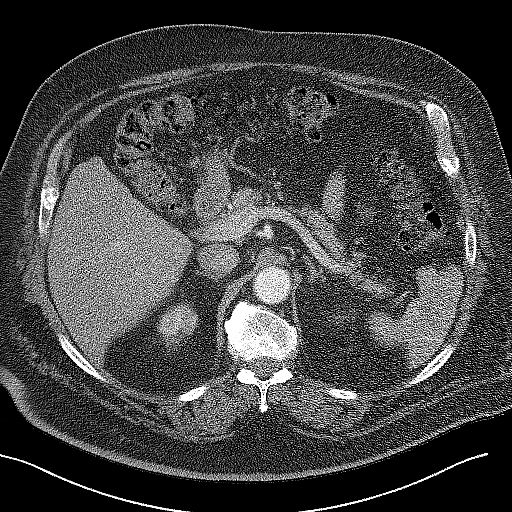
[im 22/99  soft-tissue]
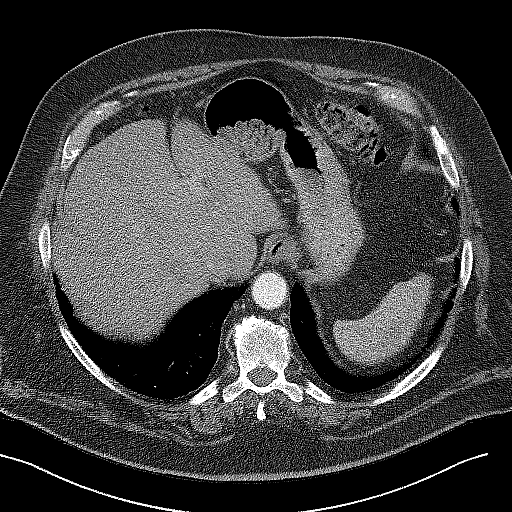

[Series 7: coronals · coronal · 0.62mm/px · 3 of 145 slices shown]
[im 37/145  soft-tissue]
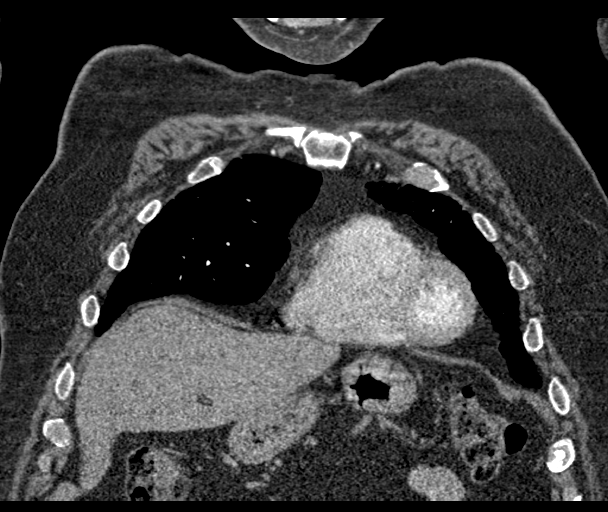
[im 73/145  soft-tissue]
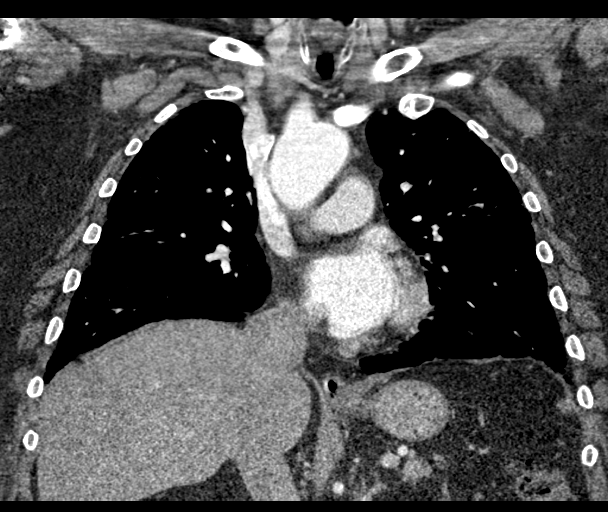
[im 109/145  soft-tissue]
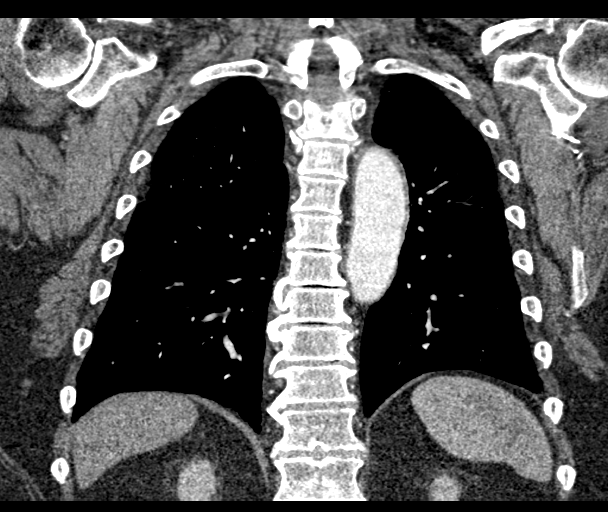

[17 of 46 positions shown; findings below may reference images not displayed]

RADIATION DOSE REDUCTION: This exam was performed according to the
departmental dose-optimization program which includes automated
exposure control, adjustment of the mA and/or kV according to
patient size and/or use of iterative reconstruction technique.

CONTRAST:  80mL OMNIPAQUE IOHEXOL 350 MG/ML SOLN
FINDINGS: Cardiovascular:

Heart: No cardiomegaly or pericardial effusion. No significant
coronary calcifications.

Aorta:

No significant aortic valve calcifications. Due to the tortuosity of
the aorta, the most accurate estimated diameters of the aortic root
and ascending aorta may be on the coronal reformatted images.

Aortic annulus: 3 cm

Sinotubular junction: 3.4 cm

Maximum estimated diameter of the ascending thoracic aorta: 4.1 cm

No dissection or intramural hematoma seen.  No ulcerated plaques.

Pulmonary arteries: Contrast bolus is not optimized for evaluation
of the pulmonary artery filling defects. No obvious filling defects
seen. Pulmonary arteries have a normal size and morphology.

Mediastinum/Nodes: No enlarged mediastinal, hilar, or axillary lymph
nodes. Thyroid gland, trachea, and esophagus demonstrate no
significant findings.

Lungs/Pleura: No pulmonary consolidation, discrete pulmonary nodule
or pleural effusion seen. There are some reticulonodular
interstitial changes and pleural pulmonary scarring seen, stable.

Upper Abdomen: Hepatic steatosis. Small hiatal hernia. Status post
cholecystectomy. Moderate stool stool burden in the colon.

Musculoskeletal: Mild thoracic spondylosis.

Review of the MIP images confirms the above findings.
IMPRESSION: There has been no significant interval change in the size and
morphology of the ascending thoracic aorta when compared to the
previous study dated September 28, 2020. Given the thoracic aortic
tortuosity, the best estimated maximum dimension of the ascending
thoracic aorta is 4.1 cm.

Recommend annual imaging followup by CTA or MRA. This recommendation
follows 6787 ACCF/AHA/AATS/ACR/ASA/SCA/BELCHER/DON LOLITO/ANALI/TAITANO Guidelines
for the Diagnosis and Management of Patients with Thoracic Aortic
Disease. Circulation. 6787; 121: E266-e369. Aortic aneurysm NOS
([id number])

No discrete pulmonary nodule. Stable reticulonodular interstitial
changes and pleural pulmonary scarring in the lungs.

## 2024-05-18 ENCOUNTER — Other Ambulatory Visit: Payer: Self-pay

## 2024-05-18 NOTE — Progress Notes (Signed)
 See pharmacotherapy note 05/07/24 -   Patient continues Fasenra  for asthma. Previously filled through patient assistance. Now filling through [facility].   Complete education provided on 05/07/24 prior to first dispense from [facility].   Aleck Puls, PharmD, BCPS, CPP Clinical Pharmacist  [facility]  [facility]

## 2024-07-23 ENCOUNTER — Encounter: Admitting: Family Medicine

## 2024-07-27 ENCOUNTER — Ambulatory Visit: Admitting: Internal Medicine

## 2024-07-27 ENCOUNTER — Encounter

## 2024-11-24 ENCOUNTER — Ambulatory Visit
